# Patient Record
Sex: Male | Born: 1960 | Race: White | Hispanic: No | Marital: Married | Smoking: Former smoker
Health system: Southern US, Community
[De-identification: ages and names within clinical notes are randomized; demographics above are authoritative.]

## PROBLEM LIST (undated history)

## (undated) ENCOUNTER — Ambulatory Visit (HOSPITAL_COMMUNITY): Discharge status: Absent without leave | Payer: 59

## (undated) DIAGNOSIS — Z9889 Other specified postprocedural states: Secondary | ICD-10-CM

## (undated) DIAGNOSIS — M069 Rheumatoid arthritis, unspecified: Secondary | ICD-10-CM

## (undated) DIAGNOSIS — K219 Gastro-esophageal reflux disease without esophagitis: Secondary | ICD-10-CM

## (undated) DIAGNOSIS — L03119 Cellulitis of unspecified part of limb: Secondary | ICD-10-CM

## (undated) DIAGNOSIS — G4733 Obstructive sleep apnea (adult) (pediatric): Secondary | ICD-10-CM

## (undated) DIAGNOSIS — K5790 Diverticulosis of intestine, part unspecified, without perforation or abscess without bleeding: Secondary | ICD-10-CM

## (undated) DIAGNOSIS — R609 Edema, unspecified: Secondary | ICD-10-CM

## (undated) DIAGNOSIS — T7840XA Allergy, unspecified, initial encounter: Secondary | ICD-10-CM

## (undated) DIAGNOSIS — Z95828 Presence of other vascular implants and grafts: Secondary | ICD-10-CM

## (undated) DIAGNOSIS — M009 Pyogenic arthritis, unspecified: Secondary | ICD-10-CM

## (undated) DIAGNOSIS — J189 Pneumonia, unspecified organism: Secondary | ICD-10-CM

## (undated) DIAGNOSIS — H269 Unspecified cataract: Secondary | ICD-10-CM

## (undated) DIAGNOSIS — I10 Essential (primary) hypertension: Secondary | ICD-10-CM

## (undated) DIAGNOSIS — G4730 Sleep apnea, unspecified: Secondary | ICD-10-CM

## (undated) DIAGNOSIS — D649 Anemia, unspecified: Secondary | ICD-10-CM

## (undated) DIAGNOSIS — R112 Nausea with vomiting, unspecified: Secondary | ICD-10-CM

## (undated) HISTORY — PX: EXCISIONAL HEMORRHOIDECTOMY: SHX1541

## (undated) HISTORY — PX: REPLACEMENT TOTAL HIP W/  RESURFACING IMPLANTS: SUR1222

## (undated) HISTORY — PX: OTHER SURGICAL HISTORY: SHX169

## (undated) HISTORY — PX: REPLACEMENT TOTAL KNEE BILATERAL: SUR1225

## (undated) HISTORY — DX: Unspecified cataract: H26.9

## (undated) HISTORY — DX: Sleep apnea, unspecified: G47.30

## (undated) HISTORY — DX: Essential (primary) hypertension: I10

## (undated) HISTORY — DX: Diverticulosis of intestine, part unspecified, without perforation or abscess without bleeding: K57.90

## (undated) HISTORY — DX: Gastro-esophageal reflux disease without esophagitis: K21.9

## (undated) HISTORY — DX: Allergy, unspecified, initial encounter: T78.40XA

## (undated) HISTORY — PX: HERNIA REPAIR: SHX51

## (undated) HISTORY — PX: FOREIGN BODY REMOVAL: SHX962

---

## 2001-01-30 ENCOUNTER — Encounter: Payer: Self-pay | Admitting: Orthopedic Surgery

## 2001-02-03 ENCOUNTER — Encounter: Payer: Self-pay | Admitting: Orthopedic Surgery

## 2001-02-04 ENCOUNTER — Inpatient Hospital Stay (HOSPITAL_COMMUNITY): Admission: RE | Admit: 2001-02-04 | Discharge: 2001-02-07 | Payer: Self-pay | Admitting: Orthopedic Surgery

## 2004-11-29 ENCOUNTER — Encounter: Admission: RE | Admit: 2004-11-29 | Discharge: 2004-11-29 | Payer: Self-pay | Admitting: Internal Medicine

## 2005-01-05 ENCOUNTER — Encounter: Admission: RE | Admit: 2005-01-05 | Discharge: 2005-01-05 | Payer: Self-pay | Admitting: Internal Medicine

## 2005-01-13 ENCOUNTER — Inpatient Hospital Stay (HOSPITAL_COMMUNITY): Admission: EM | Admit: 2005-01-13 | Discharge: 2005-01-15 | Payer: Self-pay | Admitting: Emergency Medicine

## 2006-05-29 ENCOUNTER — Ambulatory Visit: Payer: Self-pay | Admitting: Rheumatology

## 2006-11-08 ENCOUNTER — Inpatient Hospital Stay (HOSPITAL_COMMUNITY): Admission: RE | Admit: 2006-11-08 | Discharge: 2006-11-11 | Payer: Self-pay | Admitting: Orthopedic Surgery

## 2009-09-05 ENCOUNTER — Observation Stay (HOSPITAL_COMMUNITY): Admission: EM | Admit: 2009-09-05 | Discharge: 2009-09-06 | Payer: Self-pay | Admitting: Emergency Medicine

## 2010-07-03 ENCOUNTER — Encounter: Admission: RE | Admit: 2010-07-03 | Discharge: 2010-07-03 | Payer: Self-pay | Admitting: Internal Medicine

## 2010-08-23 ENCOUNTER — Inpatient Hospital Stay (HOSPITAL_COMMUNITY)
Admission: RE | Admit: 2010-08-23 | Discharge: 2010-08-26 | Payer: Self-pay | Source: Home / Self Care | Admitting: Orthopedic Surgery

## 2010-10-15 HISTORY — PX: REVISION TOTAL KNEE ARTHROPLASTY: SUR1280

## 2010-10-15 HISTORY — PX: COLONOSCOPY: SHX174

## 2010-11-05 ENCOUNTER — Encounter (HOSPITAL_BASED_OUTPATIENT_CLINIC_OR_DEPARTMENT_OTHER): Payer: Self-pay | Admitting: Internal Medicine

## 2010-12-21 ENCOUNTER — Other Ambulatory Visit: Payer: Self-pay | Admitting: Orthopedic Surgery

## 2010-12-21 ENCOUNTER — Other Ambulatory Visit (HOSPITAL_COMMUNITY): Payer: Self-pay | Admitting: Orthopedic Surgery

## 2010-12-21 ENCOUNTER — Ambulatory Visit (HOSPITAL_COMMUNITY)
Admission: RE | Admit: 2010-12-21 | Discharge: 2010-12-21 | Disposition: A | Discharge status: Home / Self Care | Payer: 59 | Source: Ambulatory Visit | Attending: Orthopedic Surgery | Admitting: Orthopedic Surgery

## 2010-12-21 ENCOUNTER — Encounter (HOSPITAL_COMMUNITY): Payer: 59

## 2010-12-21 DIAGNOSIS — Z01811 Encounter for preprocedural respiratory examination: Secondary | ICD-10-CM | POA: Insufficient documentation

## 2010-12-21 DIAGNOSIS — Z01812 Encounter for preprocedural laboratory examination: Secondary | ICD-10-CM | POA: Insufficient documentation

## 2010-12-21 DIAGNOSIS — I10 Essential (primary) hypertension: Secondary | ICD-10-CM

## 2010-12-21 DIAGNOSIS — M169 Osteoarthritis of hip, unspecified: Secondary | ICD-10-CM | POA: Insufficient documentation

## 2010-12-21 DIAGNOSIS — M1610 Unilateral primary osteoarthritis, unspecified hip: Secondary | ICD-10-CM | POA: Insufficient documentation

## 2010-12-21 LAB — COMPREHENSIVE METABOLIC PANEL
ALT: 14 U/L (ref 0–53)
AST: 20 U/L (ref 0–37)
Albumin: 3.6 g/dL (ref 3.5–5.2)
Alkaline Phosphatase: 73 U/L (ref 39–117)
Calcium: 9.2 mg/dL (ref 8.4–10.5)
Chloride: 103 mEq/L (ref 96–112)
GFR calc non Af Amer: >60 mL/min (ref >60)
Glucose, Bld: 79 mg/dL (ref 70–99)
Sodium: 139 mEq/L (ref 135–145)

## 2010-12-21 LAB — CBC
HCT: 41 % (ref 39.0–52.0)
Hemoglobin: 13.2 g/dL (ref 13.0–17.0)
MCHC: 32.2 g/dL (ref 30.0–36.0)
RDW: 13.6 % (ref 11.5–15.5)
WBC: 9.5 10*3/uL (ref 4.0–10.5)

## 2010-12-21 LAB — URINALYSIS, ROUTINE W REFLEX MICROSCOPIC
Ketones, ur: NEGATIVE mg/dL
Nitrite: NEGATIVE
Protein, ur: NEGATIVE mg/dL
Specific Gravity, Urine: 1.005 (ref 1.005–1.030)
pH: 7 (ref 5.0–8.0)

## 2010-12-21 LAB — APTT: aPTT: 33 seconds (ref 24–37)

## 2010-12-21 LAB — SURGICAL PCR SCREEN: Staphylococcus aureus: NEGATIVE

## 2010-12-26 LAB — CBC
HCT: 30.4 % — ABNORMAL LOW (ref 39.0–52.0)
Hemoglobin: 10.3 g/dL — ABNORMAL LOW (ref 13.0–17.0)
Hemoglobin: 10.4 g/dL — ABNORMAL LOW (ref 13.0–17.0)
MCH: 29.5 pg (ref 26.0–34.0)
MCV: 86.6 fL (ref 78.0–100.0)
Platelets: 274 10*3/uL (ref 150–400)
RBC: 3.51 MIL/uL — ABNORMAL LOW (ref 4.22–5.81)
RBC: 3.54 MIL/uL — ABNORMAL LOW (ref 4.22–5.81)
RBC: 3.86 MIL/uL — ABNORMAL LOW (ref 4.22–5.81)
WBC: 12.8 10*3/uL — ABNORMAL HIGH (ref 4.0–10.5)

## 2010-12-26 LAB — PROTIME-INR
INR: 1.18 (ref 0.00–1.49)
INR: 1.26 (ref 0.00–1.49)
INR: 1.28 (ref 0.00–1.49)
Prothrombin Time: 16 seconds — ABNORMAL HIGH (ref 11.6–15.2)
Prothrombin Time: 16.2 seconds — ABNORMAL HIGH (ref 11.6–15.2)

## 2010-12-26 LAB — BASIC METABOLIC PANEL
BUN: 10 mg/dL (ref 6–23)
BUN: 12 mg/dL (ref 6–23)
CO2: 31 mEq/L (ref 19–32)
Calcium: 8.5 mg/dL (ref 8.4–10.5)
Chloride: 103 mEq/L (ref 96–112)
Creatinine, Ser: 0.83 mg/dL (ref 0.4–1.5)
GFR calc Af Amer: >60 mL/min (ref >60)
GFR calc non Af Amer: >60 mL/min (ref >60)
GFR calc non Af Amer: >60 mL/min (ref >60)
Glucose, Bld: 144 mg/dL — ABNORMAL HIGH (ref 70–99)
Potassium: 4.3 mEq/L (ref 3.5–5.1)
Potassium: 4.4 mEq/L (ref 3.5–5.1)
Sodium: 140 mEq/L (ref 135–145)

## 2010-12-26 LAB — TYPE AND SCREEN
ABO/RH(D): O POS
Antibody Screen: NEGATIVE

## 2010-12-27 ENCOUNTER — Inpatient Hospital Stay (HOSPITAL_COMMUNITY)
Admission: RE | Admit: 2010-12-27 | Discharge: 2010-12-27 | DRG: 561 | Disposition: A | Discharge status: Home / Self Care | Payer: 59 | Source: Ambulatory Visit | Attending: Orthopedic Surgery | Admitting: Orthopedic Surgery

## 2010-12-27 DIAGNOSIS — Z5309 Procedure and treatment not carried out because of other contraindication: Secondary | ICD-10-CM

## 2010-12-27 DIAGNOSIS — Z0183 Encounter for blood typing: Secondary | ICD-10-CM

## 2010-12-27 DIAGNOSIS — Z96659 Presence of unspecified artificial knee joint: Secondary | ICD-10-CM

## 2010-12-27 DIAGNOSIS — Y833 Surgical operation with formation of external stoma as the cause of abnormal reaction of the patient, or of later complication, without mention of misadventure at the time of the procedure: Secondary | ICD-10-CM | POA: Diagnosis present

## 2010-12-27 DIAGNOSIS — T84099A Other mechanical complication of unspecified internal joint prosthesis, initial encounter: Principal | ICD-10-CM | POA: Diagnosis present

## 2010-12-27 LAB — URINALYSIS, ROUTINE W REFLEX MICROSCOPIC
Bilirubin Urine: NEGATIVE
Glucose, UA: NEGATIVE mg/dL
Hgb urine dipstick: NEGATIVE
Nitrite: NEGATIVE
Urobilinogen, UA: 0.2 mg/dL (ref 0.0–1.0)
pH: 6.5 (ref 5.0–8.0)

## 2010-12-27 LAB — COMPREHENSIVE METABOLIC PANEL
ALT: 14 U/L (ref 0–53)
AST: 19 U/L (ref 0–37)
Alkaline Phosphatase: 70 U/L (ref 39–117)
Calcium: 9.3 mg/dL (ref 8.4–10.5)
Chloride: 103 mEq/L (ref 96–112)
GFR calc Af Amer: >60 mL/min (ref >60)
GFR calc non Af Amer: >60 mL/min (ref >60)

## 2010-12-27 LAB — CBC
MCV: 85.3 fL (ref 78.0–100.0)
Platelets: 347 10*3/uL (ref 150–400)
RDW: 13.4 % (ref 11.5–15.5)

## 2010-12-27 LAB — APTT: aPTT: 34 seconds (ref 24–37)

## 2010-12-27 LAB — SURGICAL PCR SCREEN
MRSA, PCR: NEGATIVE
Staphylococcus aureus: POSITIVE — AB

## 2010-12-27 LAB — PROTIME-INR: Prothrombin Time: 14.7 seconds (ref 11.6–15.2)

## 2011-01-03 ENCOUNTER — Inpatient Hospital Stay (HOSPITAL_COMMUNITY): Admission: RE | Admit: 2011-01-03 | Payer: 59 | Source: Ambulatory Visit | Admitting: Orthopedic Surgery

## 2011-01-03 ENCOUNTER — Inpatient Hospital Stay (HOSPITAL_COMMUNITY)
Admission: RE | Admit: 2011-01-03 | Discharge: 2011-01-07 | DRG: 468 | Disposition: A | Discharge status: Home Health | Payer: 59 | Source: Ambulatory Visit | Attending: Orthopedic Surgery | Admitting: Orthopedic Surgery

## 2011-01-03 DIAGNOSIS — T84099A Other mechanical complication of unspecified internal joint prosthesis, initial encounter: Principal | ICD-10-CM | POA: Diagnosis present

## 2011-01-03 DIAGNOSIS — Y831 Surgical operation with implant of artificial internal device as the cause of abnormal reaction of the patient, or of later complication, without mention of misadventure at the time of the procedure: Secondary | ICD-10-CM | POA: Diagnosis present

## 2011-01-03 DIAGNOSIS — Z96659 Presence of unspecified artificial knee joint: Secondary | ICD-10-CM

## 2011-01-03 DIAGNOSIS — K219 Gastro-esophageal reflux disease without esophagitis: Secondary | ICD-10-CM | POA: Diagnosis present

## 2011-01-03 DIAGNOSIS — G4733 Obstructive sleep apnea (adult) (pediatric): Secondary | ICD-10-CM | POA: Diagnosis present

## 2011-01-03 DIAGNOSIS — M069 Rheumatoid arthritis, unspecified: Secondary | ICD-10-CM | POA: Diagnosis present

## 2011-01-03 DIAGNOSIS — I10 Essential (primary) hypertension: Secondary | ICD-10-CM | POA: Diagnosis present

## 2011-01-03 DIAGNOSIS — J45909 Unspecified asthma, uncomplicated: Secondary | ICD-10-CM | POA: Diagnosis present

## 2011-01-04 LAB — BASIC METABOLIC PANEL
BUN: 12 mg/dL (ref 6–23)
Chloride: 105 mEq/L (ref 96–112)
Glucose, Bld: 119 mg/dL — ABNORMAL HIGH (ref 70–99)
Potassium: 4.3 mEq/L (ref 3.5–5.1)

## 2011-01-04 LAB — CBC
HCT: 33.9 % — ABNORMAL LOW (ref 39.0–52.0)
Hemoglobin: 10.5 g/dL — ABNORMAL LOW (ref 13.0–17.0)
MCV: 86.7 fL (ref 78.0–100.0)
RBC: 3.91 MIL/uL — ABNORMAL LOW (ref 4.22–5.81)
WBC: 10.3 10*3/uL (ref 4.0–10.5)

## 2011-01-05 LAB — BASIC METABOLIC PANEL
BUN: 7 mg/dL (ref 6–23)
CO2: 29 mEq/L (ref 19–32)
Calcium: 8.3 mg/dL — ABNORMAL LOW (ref 8.4–10.5)
GFR calc non Af Amer: >60 mL/min (ref >60)
Glucose, Bld: 151 mg/dL — ABNORMAL HIGH (ref 70–99)
Potassium: 4.1 mEq/L (ref 3.5–5.1)

## 2011-01-05 LAB — CBC
HCT: 32.1 % — ABNORMAL LOW (ref 39.0–52.0)
Hemoglobin: 10 g/dL — ABNORMAL LOW (ref 13.0–17.0)
MCHC: 31.2 g/dL (ref 30.0–36.0)
MCV: 86.5 fL (ref 78.0–100.0)
RDW: 14 % (ref 11.5–15.5)

## 2011-01-06 LAB — CBC
HCT: 31.6 % — ABNORMAL LOW (ref 39.0–52.0)
MCH: 26.4 pg (ref 26.0–34.0)
MCHC: 30.7 g/dL (ref 30.0–36.0)
RDW: 14 % (ref 11.5–15.5)

## 2011-01-08 NOTE — Op Note (Signed)
[name], [name]                 ACCOUNT NO.:  [id number]  MEDICAL RECORD NO.:  [id number]           PATIENT TYPE:  I  LOCATION:  1416                         FACILITY:  [facility]  PHYSICIAN:  Ollen Gross, M.D.    DATE OF BIRTH:  [birth year]  DATE OF PROCEDURE: DATE OF DISCHARGE:                              OPERATIVE REPORT   PREOPERATIVE DIAGNOSIS:  Failed right total knee arthroplasty.  POSTOPERATIVE DIAGNOSIS:  Failed right total knee arthroplasty.  PROCEDURE:  Right total knee arthroplasty revision.  SURGEON:  Ollen Gross, M.D.  ASSISTANT:  Rozell Searing, PA-C  ANESTHESIA:  General.  ESTIMATED BLOOD LOSS:  Minimal.  DRAINS:  Hemovac x1.  TOURNIQUET TIME:  Up 43 minutes at 300 mmHg, down 8 minutes, up additional 27 minutes at 300 mmHg.  COMPLICATIONS:  None.  CONDITION:  Stable to recovery room.  BRIEF CLINICAL NOTE:  [name] is a 50 year old male who had a right total knee arthroplasty done many years ago in [place].  He has had progressive instability and effusions.  Radiographs showed that there is polyethylene wear without any definitive osteolysis.  He has a first- generation PCA knee.  The model has been discontinued and the polyethylenes are not readily available.  He presents now for a total knee arthroplasty revision.  PROCEDURE IN DETAIL:  After successful administration of general anesthetic, a tourniquet is placed high on his right thigh and his right lower extremity was prepped and draped in the usual sterile fashion. Extremities wrapped in Esmarch, knee flexed and tourniquet inflated to 300 mmHg.  Midline incision was made with a 10 blade through the subcutaneous tissue to the extensor mechanism.  A fresh blade is used to make a medial parapatellar arthrotomy.  Soft tissue on the proximal medial tibia subperiosteally elevated to joint line with the knife and into the semimembranosus bursa with a Cobb elevator.  There was a tremendous amount of  wear, debris and fibrinous material in the joint. I did not encounter any significant fluid.  Absolutely no signs of infection in the joint.  There were flakes of polyethylene throughout the joint.  These were all removed and a thorough synovectomy was performed.  Patella was then everted laterally and the knee flexed 90 degrees.  I removed the polyethylene easily from the tibial tray and there is delamination of the polyethylene with a crack posterior medially.  This was removed.  The tibial component is grossly loose.  I then using osteotomes disrupting interface between the femoral component and bone and that is removed with minimal bone loss.  The tibia subluxed, forward retractors were placed and tibial component easily removed.  Then removed the cement.  The canals were entered in above the tibial and femoral side and both were thoroughly irrigated.  We reamed on the femoral side up to 22 mm, tibial side to 13 mm.  Tibia is again subluxed.  Forward retractors were placed.  The extramedullary tibial alignment guide is placed referencing proximally at the medial third and tibial tubercle and distally along the second metatarsal axis and tibial crest.  Block is pinned to take additional mm  off the cut bone surface.  This led to a nice stable platform and healthy-appearing bone.  Then I prepared with a sleeve, get up to 37 mm with excellent torsional fit.  Proximal tibia is then prepared to modular drill and keel punch also.  The trial tibia was placed which was then MBT size 4 revision tray with a 37 sleeve and 13 x 30 stem extension.  It had an excellent fit on the cut bone surface.  We then worked on the femur.  A 22 reamer was placed to serve as our intramedullary cutting guide.  The size 4 is most appropriate femur and then we placed the distal femoral cutting block, 5 degrees of valgus.  I skimmed some bone medial and lateral, I needed a 4-mm augment on each to get to the  appropriate joint line.  We then placed a size 4 cutting block in a +2 position effectively raising the stem and lowering the flange of the component down to the anterior cortex of the femur.  The anterior and posterior cuts are made after we set the rotation based on creating a rectangular flexion gap with spacer blocks.  Cuts are made. We need 4 mm augments posteriorly, both medial and lateral.  The intercondylar block is placed and chamfer intercondylar cuts made.  I made the cuts for TC3 component.  The trials were then placed.  Tibial side is the size 4 MBT revision tray with a 37 sleeve and 13 x 30 stem extension and on the femoral side, it is a size 4 TC3 component with a 18 x 75 stem in a +2 position, 4 mm augments medial and lateral distally and 4 mm augments posteriorly medial and lateral.  Both trials had excellent fit on the cut bone surfaces.  I trialed a 20 mm insert and has great stability with full extension, had no varus or valgus play and no anterior-posterior plane flexion.  The patella components appeared intact.  I did patelloplasty, removing all soft tissue of the component and it is intact with minimal cold flow but no wear.  Patella tracks normally.  With the trial in place, we then released the tourniquet for initial tourniquet time of 43 minutes.  Tourniquet is held down for 8 minutes while the components were assembled on the back table.  We then thoroughly irrigated the joint.  The tourniquet was down for 8 minutes and then the leg was rewrapped in [employer].  The trials were removed.  We then trialed for the cement restrictor in the tibia, into size 5.  Space the appropriate depth in the tibial canal.  Wound is then copiously irrigated with saline solution and cut bone surfaces copiously irrigated with pulsatile lavage.  The cement was then mixed.  Once ready for implantation, is injected in the tibial canal and tibial component is impacted and extruded cement  removed.  Femoral component cemented distally with a Press-Fit stem.  All extruded cement was removed.  A 20-mm trial was placed, knee held in full extension and remainder of the extruded cement removed.  When cement has fully hardened, then the permanent 20 mm TC3 rotating platform insert is placed in the tibial tray.  The knee was reduced with outstanding stability.  There is full extension with excellent varus-valgus, anterior-posterior stability throughout, full range of motion.  Patella tracks normally.  Wound was again irrigated with saline solution and then the arthrotomy closed over  Hemovac drain with interrupted #1 PDS.  Flexion against gravity  was 125 degrees. Patella tracks normally.  The tourniquet then released for a second tourniquet time of 27 minutes.  Subcu was closed with interrupted 2-0 Vicryl and skin with staples.  The catheter for the Marcaine pain pump is placed and pump is initiated.  Incision was cleaned and dried and Steri-Strips and bulky sterile dressing were applied.  He is then placed into a knee immobilizer, awakened and transported to recovery in stable condition.     Ollen Gross, M.D.     FA/MEDQ  D:  01/03/2011  T:  01/03/2011  Job:  [id number]  Electronically Signed by Ollen Gross M.D. on 01/08/2011 07:11:41 AM

## 2011-01-17 LAB — POCT I-STAT, CHEM 8
BUN: 12 mg/dL (ref 6–23)
Calcium, Ion: 1.18 mmol/L (ref 1.12–1.32)
Chloride: 104 mEq/L (ref 96–112)
Creatinine, Ser: 0.8 mg/dL (ref 0.4–1.5)
Glucose, Bld: 101 mg/dL — ABNORMAL HIGH (ref 70–99)
Potassium: 4.5 mEq/L (ref 3.5–5.1)

## 2011-01-17 LAB — CBC
MCHC: 34.1 g/dL (ref 30.0–36.0)
Platelets: 351 10*3/uL (ref 150–400)
RBC: 4.81 MIL/uL (ref 4.22–5.81)
RDW: 13.6 % (ref 11.5–15.5)

## 2011-01-17 LAB — PROTIME-INR
INR: 1.07 (ref 0.00–1.49)
Prothrombin Time: 13.8 seconds (ref 11.6–15.2)

## 2011-01-17 LAB — APTT: aPTT: 28 seconds (ref 24–37)

## 2011-01-17 LAB — DIFFERENTIAL
Basophils Absolute: 0.3 10*3/uL — ABNORMAL HIGH (ref 0.0–0.1)
Basophils Relative: 3 % — ABNORMAL HIGH (ref 0–1)
Eosinophils Absolute: 0.1 10*3/uL (ref 0.0–0.7)
Monocytes Relative: 4 % (ref 3–12)
Neutro Abs: 7 10*3/uL (ref 1.7–7.7)
Neutrophils Relative %: 80 % — ABNORMAL HIGH (ref 43–77)

## 2011-01-31 NOTE — Discharge Summary (Signed)
  [name], MCCASTER                 ACCOUNT NO.:  [id number]  MEDICAL RECORD NO.:  [id number]           PATIENT TYPE:  I  LOCATION:  0007                         FACILITY:  [facility]  PHYSICIAN:  Alexzandrew L. Perkins, P.A.C.DATE OF BIRTH:  [birth year]  DATE OF ADMISSION:  12/27/2010 DATE OF DISCHARGE:  12/27/2010                              DISCHARGE SUMMARY   ADMITTING DIAGNOSIS:  Failed right total-knee arthroplasty.  DISCHARGE DIAGNOSIS:  Failed right total-knee arthroplasty.  PROCEDURES:  None.  Surgery was cancelled.  BRIEF HISTORY:  The patient is a 50 year old male who was supposed to be undergoing surgery today for right total-knee arthroplasty, poly- revision versus a total-knee revision.  Unfortunately he had dipped snuff prior to the surgery on the day of surgery and felt it was not safe to proceed so the surgery was cancelled and the patient was discharged home.  HOSPITAL COURSE:  None.  Surgery was cancelled.  LABORATORY DATA:  The only laboratory scanned into the chart was his blood group type O positive.  DISCHARGE MEDICATIONS/PLAN:  The patient was discharged home.  The surgery will be rescheduled.  He will be contacted by the surgery scheduler to arrange for a future date for his cancelled procedure.     Alexzandrew L. Perkins, P.A.C.     ALP/MEDQ  D:  01/22/2011  T:  01/22/2011  Job:  [id number]  Electronically Signed by Patrica Duel P.A.C. on 01/25/2011 11:11:31 AM Electronically Signed by Ollen Gross M.D. on 01/31/2011 09:52:42 AM

## 2011-02-12 NOTE — Discharge Summary (Signed)
[name], [name]                 ACCOUNT NO.:  [id number]  MEDICAL RECORD NO.:  [id number]           PATIENT TYPE:  I  LOCATION:  1602                         FACILITY:  [facility]  PHYSICIAN:  [name], M.D.    DATE OF BIRTH:  1961-06-27  DATE OF ADMISSION:  01/03/2011 DATE OF DISCHARGE:  01/07/2011                              DISCHARGE SUMMARY   ADMITTING DIAGNOSES: 1. Right knee pain, failed arthroplasty. 2. Sleep apnea. 3. Hypertension. 4. History of asthma. 5. Reflux disease. 6. Rheumatoid arthritis. 7. Past history of cellulitis.  DISCHARGE DIAGNOSIS: 1. Failed right total knee arthroplasty, status post right total knee     replacement arthroplasty. 2. Mild postop acute blood loss anemia. 3. Sleep apnea. 4. Hypertension. 5. History of asthma. 6. Reflux disease. 7. Rheumatoid arthritis. 8. Past history of cellulitis.  PROCEDURES:  On January 03, 2011, right total knee arthroplasty revision.  SURGEON:  [name], M.D.  ASSISTANT:  Vincent Searing, PA-C.  ANESTHESIA:  General.  CONSULTS:  None.  BRIEF HISTORY:  Patient is a 50 year old male with a right total knee done many years ago in [place].  He had progressive instability and effusions.  Radiographs show polyethylene wear with definitive osteolysis.  He has a first generation PCA knee.  Model has been discontinue.  The polyethylenes are not readily available and now presents for revision.  LABORATORY DATA:  I do not have the admission CBC.  It was not scanned into this chart.  His follow-up CBC showed a hemoglobin of 10.5 and 10. Last H and H was down to 9.7 with a hematocrit of 31.6.  White count went up from 10-13.5, back down to 12.7.  I do not have the admission Chem panel, but the followup BMETs for 48 hours are all within normal limits.  Blood group type O+.  DIAGNOSTIC STUDIES:  EKG dated January 03, 2011 normal sinus rhythm, normal EKG, no specific change since last tracing, confirmed by  Dr. Olga [name].  HOSPITAL COURSE:  Patient was admitted to the [facility] and taken to OR, underwent above-stated procedure without complication. Patient tolerated the procedure well.  Patient was transferred to telemetry floor following surgery where he is monitored postoperatively. He did well through the night and then was transferred to the 6 [place]. On postop day 1, he did have some volume overload, which we had to diurese some of the extra fluids.  He was started on all of his home meds.  His rheumatoid meds were on hold temporarily just for the blood thinners.  He actually did pretty well through the night and that is why I went up to the floor on day 1.  By day 2, his pain was under better control.  He was starting to get up with therapy, was diuresing his fluids little bit better.  Dressing change and incision looked good. His blood pressure was actually little soft at that point and felt if he needed fluids that we would do that, but otherwise his hemoglobin was a level of 10.  He was slowly progressing with physical therapy and not quite ready  on day 3, still needed a little bit more therapy.  He is progressing slowly, so continue with therapy on day 3 and by day 4, he was much improved.  He was starting to meet his goals of physical therapy.  He was tolerating meds and then he was discharged home.  DISCHARGE/PLAN: 1. Patient discharged home on January 07, 2011. 2. Discharge diagnoses, please see above. 3. Discharge medications: 4. Robaxin. 5. Percocet. 6. Xarelto. 7. Continue Advair Diskus. 8. Cardizem. 9. Cetirizine. 10.Cozaar. 11.Hydrochlorothiazide. 12.Protonix. 13.Reglan. 14.Toprol XL.  DIET:  Heart-healthy diet.  ACTIVITY:  Weightbearing as tolerated.  FOLLOWUP:  Follow-up in 2 weeks.  DISPOSITION:  Home.  CONDITION ON DISCHARGE:  Improved.     [name], P.A.C.   ______________________________ [name],  M.D.    ALP/MEDQ  D:  02/01/2011  T:  02/01/2011  Job:  [id number]  cc:   [name], M.D. Fax: [phone]  Electronically Signed by [name] P.A.C. on 02/05/2011 07:58:02 AM Electronically Signed by [name] M.D. on 02/12/2011 07:09:53 AM

## 2011-02-16 ENCOUNTER — Ambulatory Visit (AMBULATORY_SURGERY_CENTER): Payer: 59 | Admitting: *Deleted

## 2011-02-16 VITALS — Ht 68.0 in | Wt 311.0 lb

## 2011-02-16 DIAGNOSIS — Z800 Family history of malignant neoplasm of digestive organs: Secondary | ICD-10-CM

## 2011-02-16 DIAGNOSIS — Z1211 Encounter for screening for malignant neoplasm of colon: Secondary | ICD-10-CM

## 2011-02-16 MED ORDER — PEG-KCL-NACL-NASULF-NA ASC-C 100 G PO SOLR: ORAL | Status: DC

## 2011-02-16 NOTE — Progress Notes (Signed)
Addended by: Sarina Ill ANN on: 02/16/2011 02:59 PM   Modules accepted: Orders

## 2011-02-19 ENCOUNTER — Encounter: Payer: Self-pay | Admitting: Gastroenterology

## 2011-03-01 ENCOUNTER — Ambulatory Visit (AMBULATORY_SURGERY_CENTER): Payer: 59 | Admitting: Gastroenterology

## 2011-03-01 ENCOUNTER — Encounter: Payer: Self-pay | Admitting: Gastroenterology

## 2011-03-01 DIAGNOSIS — Z800 Family history of malignant neoplasm of digestive organs: Secondary | ICD-10-CM

## 2011-03-01 DIAGNOSIS — Z1211 Encounter for screening for malignant neoplasm of colon: Secondary | ICD-10-CM

## 2011-03-01 DIAGNOSIS — K5730 Diverticulosis of large intestine without perforation or abscess without bleeding: Secondary | ICD-10-CM

## 2011-03-01 MED ORDER — SODIUM CHLORIDE 0.9 % IV SOLN: 500.0000 mL | INTRAVENOUS | Status: DC

## 2011-03-01 NOTE — Patient Instructions (Signed)
Moderate diverticulosis in a portion of the colon.  Please eat a high fiber diet with liberal fluid intake after today.  Continue a healthy lifestyle and return for another colonoscopy in 10 years.

## 2011-03-02 ENCOUNTER — Telehealth: Payer: Self-pay

## 2011-03-02 NOTE — Op Note (Signed)
[place]. [facility]  Patient:    [name], [name]                          MRN: [id number] Proc. Date: 02/03/01 Adm. Date:  [id number] Attending:  Ollen Gross V                           Operative Report  PREOPERATIVE DIAGNOSIS:  Osteoarthritis/AVM left hip.  POSTOPERATIVE DIAGNOSIS:  Osteoarthritis/AVM left hip.  PROCEDURE:  Left total knee arthroplasty.  SURGEON:  Trudee Grip, M.D.  ASSISTANT:  Alexzandrew L. Perkins, P.A.-C.  ANESTHESIA:  General.  ESTIMATED BLOOD LOSS:  600 cc.  DRAINS:  Hemovac x 1.  COMPLICATIONS:  None.  CONDITION:  Stable to recovery room.  INDICATIONS:  Abdur is a 50 year old male with severe degenerative change in his left hip with pain refractory to nonoperative management.  He presents now for left total knee arthroplasty.  DESCRIPTION OF PROCEDURE:  After successful administration of general anesthesia, the patient was placed in the right lateral decubitus position with the left side up and held with the hip positioner.  Left lower extremity was isolated from his perineoplasty drapes and prepped and draped in the usual sterile fashion.  A standard posterolateral incision is made, skin cut with 10 blade, subcutaneous tissue, to the level of the fascia latta which was incised in line with the skin incision.  Short external rotators were isolated off the femur and capsulectomy performed.  He had a huge rim of osteophyte approximately 1/2 inch circumferentially around the acetabulum.  The hip was dislocated, center of femoral head marked and trial prosthesis placed such that the center of the trial head corresponds to the center of the native femoral head and then the osteotomy made on the femoral neck with oscillating saw.  Femur was retracted anteriorly and acetabular exposure obtained.  The circumferential rim of osteophyte is subsequently removed.  The acetabulum is reamed starting at size 47, coursing up  increments to a 55 and then a 56 mm pinnacle sector cup is impacted into the acetabulum matching his native anatomy.  It is transfixed with dome screw and then a trial 36 mm neutral liner is placed.  Femoral preparation was initiated with a canal finder and axial reaming up to 15.5 mm.  Proximal reaming was up to a 36F and the sleeve is machined to a 36F large trial sleeve is placed with a 20 x 15 stem and a 36+8 neck.  Trial 36+0 head is placed.  Hip reduced with excellent stability throughout.  I achieved full extension, full external rotation, 70 degrees of flexion, 40 degrees adduction, 90 degrees internal rotation, and 90 degrees flexion and 70 degree internal rotation.  The hip was then copiously irrigated and the trials removed.  The apex four-way eliminator was placed into the acetabular shell.  Given his young age, we discussed options preoperatively and decided on the metal versus metal couple.  The metal liner is then placed via suction fit into the acetabulum and gently impacted into the shell.  This had a great fit.  The femur is then prepared with the proximal sleeve, 36F large impacted into the femur and then a 20 x 15 stem, #36+8 neck.  It matched his native anteversion.  Once this stem was fully impacted and a 36+0 head was placed and hip reduced, once again there was excellent stability.  The wound was  copiously irrigated with antibiotic solution and the rotators were attached to the femur through drillholes.  Fascia latta is closed over hemovac drain with interrupted #1 Vicryl in the subcu layer which was extremely thick, was closed in three layers with 0, then 0, then 2-0 Vicryl and the subcuticular closed with a running 3-0 Monocryl.  Incision was clean and dry and Steri-Strips and a bulky sterile dressing applied.  Drains hooked to suction.  The patient was placed into a knee immobilizer, awakened and transported to the recovery room in stable condition. DD:   02/03/01 TD:  02/04/01 Job: [id number]

## 2011-03-02 NOTE — Discharge Summary (Signed)
[place]. [facility]  Patient:    [name], [name]                          MRN: [id number] Adm. Date:  [id number] Disc. Date: [id number] Attending:  Loanne Drilling Dictator:   Dorie Rank, P.A.                           Discharge Summary  ADMISSION DIAGNOSES: 1. Rheumatoid arthritis. 2. Hypertension. 3. Sleep apnea.  DISCHARGE DIAGNOSES: 1. Rheumatoid arthritis, status post left total hip arthroplasty. 2. Hypertension. 3. Sleep apnea.  OPERATIONS:  On February 03, 2001, the patient underwent a left total hip arthroplasty.  SURGEON:  Ollen Gross, M.D.  ASSISTANT:  Avel Peace, P.A.-C.  ANESTHESIA:  General.  COMPLICATIONS:  None.  CONSULTS:  None.  BRIEF HISTORY:  The patient is a 50 year old white male with severe degenerative changes to his left hip that has been refractory to nonoperative management.  He was at a point where the pain was interfering with his ADLs. It was felt that he would benefit from undergoing a left total hip arthroplasty and he was admitted to the hospital to undergo the above stated procedure.  HOSPITAL COURSE:  The patient was admitted on February 03, 2001 to undergo a left total hip arthroplasty.  He tolerated the procedure well without any complications.  While in the operating room, a Hemovac drain was placed in the wound.  This was discontinued on postoperative day #1 without difficulty.  The wound was dressed in a sterile fashion while in the operating room.  This was left intact and found to be free of any drainage on postoperative day #1.  On postoperative day #2, the dressing was changed and was changed daily after that.  The wound was found to be free of any erythema or discharge throughout his hospital stay.  Postoperatively, the patient was placed on Coumadin for DVT prophylaxis.  The pharmacy dosed and monitored his Coumadin therapy as well as his INR.  Postoperatively, the patient utilized IV vancomycin for  24 hours for postoperative infection prevention.  The patient utilized a PCA of morphine for pain control.  By postoperative day #2, he was weaned off the PCA and utilized Percocet for pain, thus after.  The Percocet controlled his pain throughout his hospital stay.  While in the operating room, a Foley catheter was placed.  This was discontinued on postoperative day #2 and the patient voided well throughout the rest of his hospital stay.  A hemoglobin and hematocrit were monitored for three days postoperatively.  This was found to be stable throughout his hospital stay.  Hemoglobin on February 05, 2001, was 11.7, and on February 06, 2001, was 12.6.  Physical therapy worked with the patient for ambulation and mobility per total hip protocol.  He was placed on a touchdown weightbearing status to the right lower extremity.  He was able to maintain this weightbearing status and was ambulating well prior to discharge. On February 06, 2001, he was ambulating 100 feet with close monitoring. Occupational therapy worked with the patient for activities of daily living. The nurse case manager is working with the patient while in the hospital to facilitate his home health needs as well as setting him up with home health physical therapy.  The patient did have some constipation while in the hospital.  He was placed on ______ and on February 06, 2001, he did have a bowel movement with an enema.  A BMET was obtained on postoperative day #1 to monitor his electrolytes.  They were found to be normal on February 04, 2001. Postoperatively, the patient utilized an IV of D-5 half-normal saline, this was ______ on postoperative day #2 and his IV was discontinued on postoperative day #3.  He was found to be taking p.o. fluids well.  The patient utilized his home CPAP machine for his sleep apnea while in the hospital.  He was also administered his Enbrel from home.  His other home medication of hydrochlorothiazide was resumed.   His blood pressure and vital signs were monitored throughout his hospital stay and his blood pressure was found to be under good control while in the hospital.  His allegra D was resumed while in the hospital.  On February 07, 2001, the patient was felt to be medically stable for discharge.  LABORATORY VALUES:  CBC on February 06, 2001 revealed a white blood count of 11.9, RBC count 3.99, hemoglobin 12.6, hematocrit 35.7.  Hemoglobin on February 04, 2001 was 12.7, hematocrit was 36.3.  On February 06, 2001, hemoglobin was 12.6, hematocrit 35.7, PT and INR on February 04, 2001 was 15.1 and 1.3, respectively.  On February 06, 2001, PT was 17.8, INR was 1.8.  BMET on February 04, 2001 was within normal limits, ______ glucose of 126.  TBIL January 30, 2001 was 1.4, UA on January 30, 2001 was negative.  RADIOLOGY:  One view left hip x-ray on February 03, 2001 revealed a left total hip replacement without definite complicating features.  A 2-D chest x-ray on January 30, 2001 revealed no active disease.  EKG on January 30, 2001 was unconfirmed and showed normal sinus rhythm with occasional premature supraventricular complexes.  CONDITION ON DISCHARGE:  Improved.  DISCHARGE PLANS:  The patient was discharged home with home health physical therapy and Coumadin maintenance through [facility].  He was to continue his touchdown weightbearing status to the right lower extremity.  DISCHARGE MEDICATIONS:  He was given a prescription for: 1. Phenergan #20. 2. Coumadin per pharmacy. 3. Robaxin #40 with no refills. 4. Percocet 5 mg #40 with no refills.  DIET:  Low-sodium diet.  DISCHARGE INSTRUCTIONS:  He could shower as long as his wound was not draining with assistance.  He is to follow up with Dr. Lequita Halt in the office in 10-12 days.  He could resume his home medications with the exception of ultram and celebrex.  DD:  02/07/01 TD:  02/09/01 Job: [id number]

## 2011-03-02 NOTE — Telephone Encounter (Signed)

## 2011-03-02 NOTE — H&P (Signed)
[name], [name]                 ACCOUNT NO.:  [id number]   MEDICAL RECORD NO.:  [id number]          PATIENT TYPE:  INP   LOCATION:  NA                           FACILITY:  [facility]   PHYSICIAN:  Ollen Gross, M.D.    DATE OF BIRTH:  08-Apr-1961   DATE OF ADMISSION:  11/08/2006  DATE OF DISCHARGE:                              HISTORY & PHYSICAL   DATE OF OFFICE VISIT/HISTORY AND PHYSICAL:  November 01, 2006.   DATE OF ADMISSION:  November 08, 2006.   CHIEF COMPLAINT:  Left knee pain.   HISTORY OF PRESENT ILLNESS:  Patient is a 50 year old male who has been  seen by Dr. Despina Hick for ongoing left knee pain.  He is known to have  significant arthritis with recurring effusions.  He has undergone a  previous left total hip in 2002 and doing well with that.  The left knee  continues to be problematic.  It has reached the point where he would  benefit from undergoing surgical intervention.  Risks and benefits have  been discussed.  He has been seen preoperatively with Dr. Eloise Harman, who  felt there are no medical contraindications for his planned surgery.   ALLERGIES:  PENICILLIN, ERYTHROMYCIN, SULFA.  All three cause rash and  itching.   CURRENT MEDICATIONS:  He has been on a recent course of Avelox, but he  has finished this.  Nasacort, Cozaar.  Relafen, which was stopped before  surgery.  Protonix.  .  Plaquenil.  Hydrochlorothiazide.  Cardizem CD.  Advair.   PAST MEDICAL HISTORY:  History of walking pneumonia years ago.  Rheumatoid arthritis.  Hypertension.  Sleep apnea, which he does use his  CPAP for, and a recent chest cold for which he was recently treated with  Avelox.   PAST SURGICAL HISTORY:  He has undergone a left total hip arthroplasty  in the past.   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  Dips tobacco daily.  No alcohol.   FAMILY HISTORY:  Mother living at age 59 with hypertension.  Father  living at age 4 with a history of hypertension and gout.   REVIEW OF SYSTEMS:  GENERAL:  No fevers, chills, night sweats.  NEURO:  No seizures, syncope, paralysis.  RESPIRATORY:  He does have sleep  apnea, for which he uses CPAP for.  He has been treated with a recent  chest cold with a course of Avelox.  No shortness of breath, productive  cough, or hemoptysis at this time.  CARDIOVASCULAR:  No chest pain,  angina, or orthopnea.  GI:  No nausea, vomiting, diarrhea, or  constipation.  GU:  No dysuria, hematuria, or discharge.  MUSCULOSKELETAL:  Left knee.   PHYSICAL EXAMINATION:  VITAL SIGNS:  Pulse 84, respirations 12, blood  pressure 152/78.  GENERAL:  A 50 year old white male, well-developed, well-nourished,  overweight, obese, in no acute distress.  He is alert, oriented and  cooperative.  Accompanied by his wife.  HEENT:  Normocephalic and atraumatic.  Pupils are round and reactive.  Oropharynx is clear.  EOMs are intact.  NECK:  Supple.  No bruits.  CHEST:  Clear.  HEART:  Regular rate and rhythm with a faint systolic murmur over the  aortic point.  ABDOMEN:  Round, protuberant.  Bowel sounds present.  RECTAL/BREASTS/GENITALIA:  Not done.  Not pertinent to the present  illness.  EXTREMITIES:  Left knee:  The left knee shows range of motion of 0-100  degrees.  Moderate effusion.  No signs of infection.   IMPRESSION:  1. Rheumatoid arthritis, left knee.  2. Hypertension.  3. Sleep apnea, uses CPAP.   PLAN:  Patient admitted to [facility] to undergo a left total  knee replacement arthroplasty.  The surgery will be performed by Dr.  Trudee Grip.  His medical physician is Dr. Jarome Matin, who will be  notified of the room number on admission and be consulted if needed for  medical assistance with the patient throughout the hospital course.      [name], P.A.      Ollen Gross, M.D.  Electronically Signed    ALP/MEDQ  D:  11/07/2006  T:  11/07/2006  Job:  [id number]   cc:   Barry Dienes. Eloise Harman,  M.D.  Fax: [phone]

## 2011-03-02 NOTE — Op Note (Signed)
NAMEKAYLUB, DETIENNE                 ACCOUNT NO.:  [id number]   MEDICAL RECORD NO.:  [id number]          PATIENT TYPE:  INP   LOCATION:  0002                         FACILITY:  [facility]   PHYSICIAN:  Ollen Gross, M.D.    DATE OF BIRTH:  1961-08-26   DATE OF PROCEDURE:  11/08/2006  DATE OF DISCHARGE:                               OPERATIVE REPORT   PREOPERATIVE DIAGNOSIS:  Rheumatoid arthritis, left knee.   POSTOPERATIVE DIAGNOSIS:  Rheumatoid arthritis, left knee.   PROCEDURE:  Left total knee arthroplasty.   SURGEON:  Dr. Lequita Halt.   ASSISTANT:  Avel Peace.   ANESTHESIA:  General with postop Marcaine pain pump.   ESTIMATED BLOOD LOSS:  Minimal.   DRAINS:  Hemovac x1.   TOURNIQUET TIME:  44 minutes at 300 mmHg.   COMPLICATIONS:  None.   CONDITION:  Stable to recovery.   CLINICAL NOTE:  Harrie is a 50 year old male who has severe end-stage  degenerative change of the left knee secondary to rheumatoid arthritis.  He has had recurrent effusions and synovitis.  He has intractable pain  and presents now for left total knee arthroplasty.   PROCEDURE IN DETAIL:  After successful administration of general  anesthetic, the tourniquet was placed high on the left thigh, left lower  extremity prepped and draped in the usual sterile fashion.  Extremities  wrapped in Esmarch, knee flexed, tourniquet inflated to 300 mmHg.  A  midline incision was made with a 10 blade through subcutaneous tissue to  the level of the extensor mechanism.  A fresh blade was used to make a  medial parapatellar arthrotomy.  The soft tissue over the proximal  medial tibia subperiosteally elevated to the joint line with a knife and  into the semimembranosus bursa with a Cobb elevator. The soft tissue  laterally is elevated with attention being paid to avoiding the patellar  tendon on the tibial tubercle. The patella is subluxed laterally, knee  flexed 90 degrees, ACL and PCL removed.  A drill was used to  create a  starting hole in the distal femur and canal was thoroughly irrigated.  A  5 degree left valgus alignment guide is placed and referencing off the  posterior condyles rotation is marked and the block pinned to remove 10  mm off the distal femur.  Distal femoral resection is made with an  oscillating saw.  A sizing block placed, size 3 is most appropriate.  Rotation is marked off the epicondylar axis and a size 3 cutting block  placed.  The anterior, posterior and chamfer cuts are made.   Tibia subluxed forward and menisci are removed. The extramedullary  tibial alignment guide is placed referencing proximally of the medial  aspect of tibial tubercle, distally along the second metatarsal axis  tibial crest.  The Block is pinned to remove about 4-5 mm from the more  deficient lateral side.  Both sides had deficiency.  Tibial resection is  made with an oscillating saw.  Spacer blocks are placed and 12.5 has the  best stability in full extension and 90 degrees of flexion with  excellent rectangular gaps.  The proximal tibia is then prepared with  the modular drill and keel punch for a size 3 and femoral preparation  completed with the intercondylar cut.   A size 3 mobile bearing tibial trial and size 3 posterior stabilized  femoral trial and 12.5 mL posterior stabilized rotating platform insert  trial are placed.  With the 12.5, full extension was achieved with  excellent varus and valgus balance throughout full range of motion.  The  patella was then everted, thickness measured to be 22 mm.  Freehand  resection is taken to 11 mm, 41 template is placed, lug holes were  drilled, trial patella is placed and it tracks normally.  Osteophytes  are removed off the posterior femur with the trial in place.  All trials  are removed and cut bone surfaces are prepared with pulsatile lavage.  The cement is mixed and once ready for implantation, the size 4 mobile  bearing tibial tray, size 3  posterior stabilized femur and 41 patella  are cemented into place and held with the clamp.  Please note that 4 was  the size for the tibia the entire time.  A 12.5 mm trial insert is  placed, knee held in full extension, all extruded cement removed. Once  the cement is fully hardened then the permanent 12.5 mm posterior  stabilized rotating platform insert is placed into the tibial tray. The  wound was copiously irrigated with saline solution and the extensor  mechanism closed over a Hemovac drain with interrupted #1 PDS. A  thorough synovectomy had been performed prior to closure. Flexion  against gravity is about 135 degrees. The tourniquet is then released  with a total time of 44 minutes. The subcu is closed with interrupted 2-  0 Vicryl and subcuticular running 4-0 Monocryl. The drain is hooked to  suction. The catheter for the Marcaine pain pump is placed and the pump  is initiated. Steri-Strips and a bulky sterile dressing are applied. He  is then awakened and transported to recovery in stable condition.      Ollen Gross, M.D.  Electronically Signed     FA/MEDQ  D:  11/08/2006  T:  11/08/2006  Job:  [id number]

## 2011-03-02 NOTE — H&P (Signed)
[place]  Patient:    [name], [name]                          MRN: [id number] Adm. Date:  02/03/01 Attending:  Trudee Grip, M.D. Dictator:   Druscilla Brownie. Shela Nevin, P.A. CC:         Dr. Judithann Sheen, [facility] [phone])   History and Physical  DATE OF BIRTH: 1961/05/05  CHIEF COMPLAINT: "Pain in my left hip."  HISTORY OF PRESENT ILLNESS: This is a 50 year old white male seen by [place] for severe and continuing problems concerning his left hip.  The patient has documented osteoarthritis by x-ray to the left hip.  He also has rheumatoid arthritis as well.  These combined problems have caused deterioration to the left hip to the point where the patient is having extreme difficulty getting about.  He is a relatively young man, who is finding this pain to be severely interfering with his day-to-day activities both at work and his pleasurable time.  After much discussion it was felt the patient would benefit from surgical intervention and is being admitted for total hip replacement arthroplasty of the left hip.  PAST MEDICAL HISTORY: This patient has been in relatively good health throughout his lifetime.  He does have:  1. Rheumatoid arthritis.  2. Hypertension.  3. He has also developed sleep apnea over the years.  He uses a CPAP machine     at home.  CURRENT MEDICATIONS:  1. Ultram 50 mg one b.i.d.  2. Hydrochlorothiazide 25 mg one b.i.d.  3. Celebrex 100 mg one b.i.d.  4. Enbrel two injections per week.  ALLERGIES:  1. PENICILLIN.  2. ERYTHROMYCIN.  PAST SURGICAL HISTORY: Total knee replacement arthroplasty in January 1993.  FAMILY PHYSICIAN: Dr. Judithann Sheen, [facility], [place], [place] [phone]).  SOCIAL HISTORY: The patient is married.  He works as a Chartered certified accountant.  He neither smokes nor drinks.  FAMILY HISTORY: Noncontributory.  REVIEW OF SYSTEMS: CNS: No seizure disorder, paralysis, numbness, or double vision.  RESPIRATORY:  No productive cough, no hemoptysis, no shortness of breath.  CARDIOVASCULAR: No chest pain, no angina, no orthopnea. GASTROINTESTINAL: No nausea, vomiting, melena, or bloody stool. GENITOURINARY: No discharge, dysuria, or hematuria.  MUSCULOSKELETAL: Primarily as in present illness.  Painful range of motion of the left hip and internal and external rotation as well.  PHYSICAL EXAMINATION:  GENERAL: Alert and cooperative, friendly, obese 50 year old white male.  VITAL SIGNS: Blood pressure 138/90, pulse 68, respirations 14.  HEENT: Head normocephalic. PERRLA.  EOMI.  Oropharynx clear.  CHEST: Clear to auscultation.  No rales or rhonchi.  HEART: Regular rate and rhythm.  No murmurs heard.  ABDOMEN: Soft, nontender.  Liver and spleen not felt.  GU/RECTAL: Examinations not done, not pertinent to present illness.  EXTREMITIES: Left hip as in present illness above.  ADMISSION DIAGNOSES:  1. Severe osteoarthritis of left hip.  2. Rheumatoid arthritis.  3. Sleep apnea.  4. Hypertension.  PLAN: The patient will undergo total hip replacement arthroplasty of the left hip.  He will bring his CPAP machine to the hospital with him, which he will use.  He will also bring his Enbrel to the hospital and give his self-administered injections for his rheumatoid arthritis.  Should he have any medical problems in the hospital we will certainly ask an area rheumatologist or internal medicine physician to follow along with [place] during this patients hospitalization. DD:  01/28/01 TD:  01/29/01 Job: [id number]

## 2011-03-02 NOTE — Discharge Summary (Signed)
NAMEFINIS, HENDRICKSEN                 ACCOUNT NO.:  [id number]   MEDICAL RECORD NO.:  [id number]          PATIENT TYPE:  INP   LOCATION:  1510                         FACILITY:  [facility]   PHYSICIAN:  Ollen Gross, M.D.    DATE OF BIRTH:  12/13/1960   DATE OF ADMISSION:  11/08/2006  DATE OF DISCHARGE:  11/11/2006                               DISCHARGE SUMMARY   ADMISSION DIAGNOSES:  1. Rheumatoid arthritis, left knee.  2. Hypertension.  3. Sleep apnea, utilizing CPAP.   DISCHARGE DIAGNOSES:  1. Rheumatoid arthritis, left knee, status post left total knee      arthroplasty.  2. Acute blood-loss anemia, did not require transfusion.  3. Mild postoperative hyponatremia.  4. Mild postoperative hypokalemia.  5. Hypertension.  6. Sleep apnea, utilizing CPAP.   PROCEDURE:  On November 08, 2006, left total knee.   SURGEON:  Ollen Gross, M.D.   ASSISTANT:  Alexzandrew L. Perkins, P.A.-C.   ANESTHESIA:  General.   TOURNIQUET TIME:  Forty-four minutes.   CONSULTS:  None.   BRIEF HISTORY:  [name] is a 50 year old male with severe end-stage  arthritis of the left knee secondary to rheumatoid arthritis.  He has  had recurrent effusion, synovitis, and intractable pain.  Now presents  for total knee.   LABORATORY DATA:  Preop CBC showed a hemoglobin of 12.2, hematocrit  36.8, white cell count 9.2.  Postop hemoglobin 10, drifted down to 9.2.  Last noted H&H 9 and 26.8.  PT/PTT preop 14.5 and 29, respectively.  INR  1.1.  Serial pro times followed:  Last noted PT/INR of 22.5 and 1.9.  Chem panel on admission:  A low albumin of 3.1.  The remaining chem  panel within normal limits.  Sodium did drop from 142 to 134, stabilized  there.  Potassium dropped only a little bit from 3.8 to 3.4.  Preop UA  negative.  Blood group type O+.   Preop chest x-ray dated October 23, 2006:  Stable chest.  No active  disease.   He also had a preop EKG dated October 23, 2006:  Normal sinus rhythm.  Within  normal limits.  Confirmed.   HOSPITAL COURSE:  Patient was admitted to [facility],  tolerated the procedure well, and was later transferred to the recovery  room and then to the orthopedic floor.  Started on PCA and p.o.  analgesics for pain control following surgery.  Actually got some sleep  the night of surgery.  Was doing pretty well on the morning of day #1.  Hemovac drain placed at the time of surgery was pulled.  He was started  on Coumadin for DVT prophylaxis.  He was doing pretty well, weaned over  to p.o. meds.  By day #2, the pain was under excellent control.  PCA was  discontinued.  The dressing was changed.  The incision looked good.  Marcaine pain pump placed at the time of surgery was removed on day #2.  Started getting up with physical therapy.  He ambulating about 100-150  feet that day.  By the following day, he continued  to progress well.  Doing well with his therapy, tolerating his meds.  He was discharged  home later that day.   DISCHARGE PLAN:  1. Patient was discharged home on November 11, 2006.  2. Discharge diagnoses:  Please see above.  3. Discharge meds:  Coumadin, Percocet, Robaxin.  4. Diet:  Resume home diet.  5. Activity:  Weightbearing as tolerated.  Home health PT, home health      nursing, total knee protocol.  6. Followup:  In two weeks.   DISPOSITION:  Home.   CONDITION ON DISCHARGE:  Improved.      [name], P.A.      Ollen Gross, M.D.  Electronically Signed    ALP/MEDQ  D:  11/28/2006  T:  11/28/2006  Job:  [id number]   cc:   Barry Dienes. Eloise Harman, M.D.  Fax: [phone]

## 2011-03-02 NOTE — Discharge Summary (Signed)
[name], [name]                 ACCOUNT NO.:  [id number]   MEDICAL RECORD NO.:  [id number]          PATIENT TYPE:  INP   LOCATION:  5007                         FACILITY:  [facility]   PHYSICIAN:  Barry Dienes. Eloise Harman, M.D.DATE OF BIRTH:  May 09, 1961   DATE OF ADMISSION:  01/13/2005  DATE OF DISCHARGE:  01/15/2005                                 DISCHARGE SUMMARY   PERTINENT FINDINGS:  The patient is a 50 year old white male who is admitted  with an extensive left lower extremity cellulitis.  On the day prior to  admission, he noted some warmth and soreness of the left ankle.  Later that  evening, he was started on Levaquin.  He took 2 doses of 750 mg of Levaquin;  however, the redness and warmth extended up to his knee, so he presented to  the emergency room for evaluation.  He also noted chills with low-grade  tactile temperatures.  He has also had decreased appetite for the past 24  hours prior to admission.  He denied chest pain or shortness of breath.  His  past medical history is significant for chronic bilateral lower extremity  edema, rheumatoid arthritis, osteoarthritis, hypertension, obstructive sleep  apnea for which he is on CPAP, right total knee arthroplasty in 1993, left  total hip replacement in April 2002, cellulitis of both legs in the Fall of  2005.  He has no history of deep vein thrombosis.   ALLERGIES:  PENICILLIN, ERYTHROMYCIN.  Note that cephalosporins have been  well tolerated.   MEDICATIONS PRIOR TO ADMISSION:  1.  Cardizem CD 240 mg daily.  2.  Hydrochlorothiazide 25 mg daily.  3.  Cozaar 50 mg daily.  4.  Levaquin 750 mg daily for the past 2 days.  5.  Enbrel 25 mg twice weekly.  6.  Relafen 500 mg p.r.n.   INITIAL PHYSICAL EXAMINATION:  Blood pressure 118/79, pulse 81, respirations  20, temperature 97.4, pulse oxygen saturation 95% on room air.  IN GENERAL:  He is an obese white male who is in no apparent distress.  CHEST:  Clear to auscultation.  HEART:  Regular rate and rhythm.  ABDOMEN:  Benign.  EXTREMITIES:  There is erythema of the left lower extremity extending from  the left lateral malleolus to the knee.   LABORATORY DATA:  White blood cell count 12.1 with a left shift, hemoglobin  12.8, platelets 308, D-Dimer 1.80.  Serum sodium 139, potassium 3.9,  chloride 106, CO2 24, BUN 10, creatinine 1.1, glucose 109.   HOSPITAL COURSE:  The patient was started on empiric treatment for possible  deep vein thrombosis with Lovenox.  He was also treated with broad-spectrum  IV antibiotics.  The condition of his leg rapidly improved, with decreased  erythema and edema in comparison with lines drawn in the emergency room.  He  also had a deep vein thrombosis ultrasound test of the left lower extremity  done on April 3 that showed no evidence of deep vein thrombosis, superficial  venous thrombosis, or Baker's cyst.   CONDITION ON DISCHARGE:  His left leg was feeling much  better.  He had no  shortness of breath.  His vital signs were normal and he was afebrile.  His  pulse oxygen saturation was 96% on room air.  His white blood cell count was  9.1.   DISCHARGE DIAGNOSES:  1.  Left lower extremity cellulitis.  2.  Chronic venous insufficiency.  3.  Rheumatoid arthritis.  4.  Hypertension.  5.  Exogenous obesity.  6.  Obstructive sleep apnea.   DISCHARGE MEDICATIONS:  1.  Enbrel 25 mg twice weekly to start one month from discharge.  2.  Cardizem CD 240 mg daily.  3.  Hydrochlorothiazide 25 mg daily.  4.  Avelox 400 mg daily for 10 days.  5.  Relafen 500 mg b.i.d. p.r.n. pain.   DIET:  Dietary recommendations:  He is advised to stay on the [employer].   SPECIAL INSTRUCTIONS:  He was advised to call for recurrence of fever,  chills, or worsening redness in his left leg, or difficulty breathing.   FOLLOW UP:  He is advised to schedule a followup appointment with Dr.  Eloise Harman at [facility] within  approximately 2 weeks  following discharge.      DGP/MEDQ  D:  03/14/2005  T:  03/14/2005  Job:  [id number]

## 2011-03-02 NOTE — H&P (Signed)
NAMEJAXSUN, CIAMPI                 ACCOUNT NO.:  [id number]   MEDICAL RECORD NO.:  [id number]          PATIENT TYPE:  EMS   LOCATION:  [facility]                         FACILITY:  [facility]   PHYSICIAN:  Mark A. Perini, M.D.   DATE OF BIRTH:  [birth year]   DATE OF ADMISSION:  01/13/2005  DATE OF DISCHARGE:                                HISTORY & PHYSICAL   CHIEF COMPLAINT:  Left lower extremity red and swollen.   HISTORY OF PRESENT ILLNESS:  Mr. Reier is a pleasant 50 year old male with a  past medical history as listed below.  He noted redness and some warmth and  some soreness about his left ankle yesterday.  Approximately 6 p.m. on January 12, 2005, he was started on Levaquin.  He has taken 2 doses of 750 mg of  Levaquin.  However, the redness and warmth extended up to his knee today,  and he presented to the emergency room.  He had some chills and some low-  grade subjective temperatures; however, he has not felt very ill with this.  His appetite has been decreased for the last 24 hours.  He denies any chest  pain or shortness of breath.  He does have some chronic bilateral lower  extremity edema, which is unchanged.  He is thought to require admission for  IV antibiotics, and to rule out DVT.   PAST MEDICAL HISTORY:  1.  Rheumatoid arthritis since age 62.  2.  Osteoarthritis.  3.  Hypertension for the last 22 years.  4.  Obstructive sleep apnea.  He uses CPAP with a setting of 13 cm of water      pressure at home nightly.  5.  Right total knee arthroplasty in 1993.  6.  Left total hip replacement in April of 2002.  7.  Cellulitis of both legs in the fall of 2005.  He denies any abdominal      surgeries.  He does have some mild chronic edema, and he has some TED      hose at home, but he does not use these regularly.   ALLERGIES:  1.  PENICILLIN causes a rash.  2.  ERYTHROMYCIN causes a rash.   MEDICATIONS:  1.  Cardizem CD 240 mg daily.  2.  HCTZ 25 mg daily.  3.  Cozaar 50 mg  daily.  4.  Levaquin 750 mg daily for the last 2 days.  5.  Enbrel 25 mg twice weekly.  6.  Relafen 500 mg as needed.   In the ER, he has received normal saline and one dose of cephazolin  intravenously, and he appears to be tolerating this well.   SOCIAL HISTORY:  He is married.  His wife's name is Lupita Leash, and she is at the  bedside.  He is a Chartered certified accountant.  He has no tobacco, no alcohol, no drug use  history.  He has 1 son who is 72 years old.   FAMILY HISTORY:  Mother is alive at age 6 with glaucoma.  Father is alive  at age 22 with hypertension and gout.  He  has no siblings.   REVIEW OF SYSTEMS:  As per the history of present illness.  He denies any  problems with his bowel movements.  He has been working on his diet, but has  failed to lose weight lately.  His wife and he have been on a [place]  Diet for 2 weeks.   PHYSICAL EXAMINATION:  VITAL SIGNS:  Blood pressure 118/79, pulse 81, 95%  saturation on room air.  Temperature 97.4 orally.  GENERAL:  He is in no acute distress.  He is alert and oriented x4.  He is  appropriate.  There is no JVD.  He is obese.  LUNGS:  Clear to auscultation bilaterally.  No wheezing, rales, or rhonchi.  HEART:  Regular rate and rhythm with no murmurs, rubs, or gallops.  ABDOMEN:  Soft and nontender.  No masses or hepatosplenomegaly.  EXTREMITIES:  The left lower extremity is warm, red, with some slight  blistering on the anterior portion of the tibia.  This ranges from just  above the ankle to just below the left knee.  There is not really a lot of  involvement over the ankle joint itself.  Therefore, I doubt that there is a  septic joint present.   LABORATORY DATA:  White count 12.1 with a left shift.  Hemoglobin of 12.8,  platelet count 308,000, MVC 87.  D-dimer is somewhat elevated at 1.80.  Sodium 139, potassium 3.9, chloride 106, CO2 of 24, BUN 10, creatinine 1.1,  glucose 109.  The pH was 7.44, PCO2 of 36.   ASSESSMENT AND PLAN:  A  50 year old male with rheumatoid arthritis and some  immunosuppression from his underlying rheumatoid arthritis and also due to  his Enbrel treatment.  He also has mild chronic edema and a previous history  of cellulitis, which predisposes him to recurrent episodes of cellulitis.  I  believe that this is primarily a cellulitis episode, although there is some  possibility of a DVT.  He will therefore be admitted and treated with  intravenous Avelox.  He will be placed on low-dose saline intravenously.  He  will continue on his CPAP therapy as per home.  Will check lower extremity  Doppler on Monday morning.  Will treatment empiric treatment dose Lovenox.  We will check a TSH and hemoglobin A1C.  Will continue his home blood  pressure medications.  In the future, he will need TED hose or compression  stockings to help prevent future episodes of cellulitis if possible.      MAP/MEDQ  D:  01/13/2005  T:  01/13/2005  Job:  [id number]   cc:   Barry Dienes. Eloise Harman, M.D.  [address]  [place]  [place] [address]  Fax: [phone]   Saverio Danker, M.D.  Rheumatologist in [place]

## 2013-01-10 ENCOUNTER — Inpatient Hospital Stay (HOSPITAL_COMMUNITY)
Admission: EM | Admit: 2013-01-10 | Discharge: 2013-01-13 | DRG: 603 | Disposition: A | Discharge status: Home / Self Care | Payer: 59 | Attending: Internal Medicine | Admitting: Internal Medicine

## 2013-01-10 DIAGNOSIS — J45909 Unspecified asthma, uncomplicated: Secondary | ICD-10-CM | POA: Diagnosis present

## 2013-01-10 DIAGNOSIS — T368X5A Adverse effect of other systemic antibiotics, initial encounter: Secondary | ICD-10-CM | POA: Diagnosis present

## 2013-01-10 DIAGNOSIS — Z96659 Presence of unspecified artificial knee joint: Secondary | ICD-10-CM

## 2013-01-10 DIAGNOSIS — Z800 Family history of malignant neoplasm of digestive organs: Secondary | ICD-10-CM

## 2013-01-10 DIAGNOSIS — Z1211 Encounter for screening for malignant neoplasm of colon: Secondary | ICD-10-CM

## 2013-01-10 DIAGNOSIS — Z6841 Body Mass Index (BMI) 40.0 and over, adult: Secondary | ICD-10-CM

## 2013-01-10 DIAGNOSIS — L0390 Cellulitis, unspecified: Secondary | ICD-10-CM

## 2013-01-10 DIAGNOSIS — L02419 Cutaneous abscess of limb, unspecified: Principal | ICD-10-CM | POA: Diagnosis present

## 2013-01-10 DIAGNOSIS — Z96649 Presence of unspecified artificial hip joint: Secondary | ICD-10-CM

## 2013-01-10 DIAGNOSIS — I872 Venous insufficiency (chronic) (peripheral): Secondary | ICD-10-CM | POA: Diagnosis present

## 2013-01-10 DIAGNOSIS — L03115 Cellulitis of right lower limb: Secondary | ICD-10-CM | POA: Diagnosis present

## 2013-01-10 DIAGNOSIS — G4733 Obstructive sleep apnea (adult) (pediatric): Secondary | ICD-10-CM | POA: Diagnosis present

## 2013-01-10 DIAGNOSIS — L299 Pruritus, unspecified: Secondary | ICD-10-CM | POA: Diagnosis present

## 2013-01-10 DIAGNOSIS — E663 Overweight: Secondary | ICD-10-CM | POA: Diagnosis present

## 2013-01-10 DIAGNOSIS — Z79899 Other long term (current) drug therapy: Secondary | ICD-10-CM

## 2013-01-10 DIAGNOSIS — Z881 Allergy status to other antibiotic agents status: Secondary | ICD-10-CM

## 2013-01-10 DIAGNOSIS — I10 Essential (primary) hypertension: Secondary | ICD-10-CM | POA: Diagnosis present

## 2013-01-10 DIAGNOSIS — M069 Rheumatoid arthritis, unspecified: Secondary | ICD-10-CM | POA: Diagnosis present

## 2013-01-10 LAB — CBC WITH DIFFERENTIAL/PLATELET
HCT: 34.6 % — ABNORMAL LOW (ref 39.0–52.0)
Hemoglobin: 11.6 g/dL — ABNORMAL LOW (ref 13.0–17.0)
Lymphs Abs: 1.6 10*3/uL (ref 0.7–4.0)
Monocytes Relative: 3 % (ref 3–12)
Neutro Abs: 9.1 10*3/uL — ABNORMAL HIGH (ref 1.7–7.7)
Neutrophils Relative %: 82 % — ABNORMAL HIGH (ref 43–77)
RBC: 4.16 MIL/uL — ABNORMAL LOW (ref 4.22–5.81)

## 2013-01-10 LAB — BASIC METABOLIC PANEL
BUN: 17 mg/dL (ref 6–23)
CO2: 23 mEq/L (ref 19–32)
Chloride: 104 mEq/L (ref 96–112)
Glucose, Bld: 108 mg/dL — ABNORMAL HIGH (ref 70–99)
Potassium: 3.8 mEq/L (ref 3.5–5.1)

## 2013-01-10 MED ORDER — DIPHENHYDRAMINE HCL 50 MG/ML IJ SOLN
25.0000 mg | Freq: Once | INTRAMUSCULAR | Status: AC
  Administered 2013-01-10: 25 mg via INTRAVENOUS
  Filled 2013-01-10: qty 1

## 2013-01-10 MED ORDER — DEXAMETHASONE SODIUM PHOSPHATE 10 MG/ML IJ SOLN
10.0000 mg | Freq: Once | INTRAMUSCULAR | Status: AC
  Administered 2013-01-10: 10 mg via INTRAVENOUS
  Filled 2013-01-10: qty 1

## 2013-01-10 MED ORDER — SODIUM CHLORIDE 0.9 % IV SOLN
INTRAVENOUS | Status: DC
  Administered 2013-01-10: via INTRAVENOUS

## 2013-01-10 MED ORDER — VANCOMYCIN HCL 10 G IV SOLR
2500.0000 mg | Freq: Once | INTRAVENOUS | Status: AC
  Administered 2013-01-10: 2500 mg via INTRAVENOUS
  Filled 2013-01-10: qty 2500

## 2013-01-10 NOTE — ED Notes (Signed)
C/o allergic reaction to avelox. Reports hives, redness, itching. Reports rash is better but wants help finding something to clear up cellulitis. Has been on clindamycin and avelox.

## 2013-01-10 NOTE — ED Notes (Signed)
Pt states he also wants his cellulitis looked at

## 2013-01-10 NOTE — ED Notes (Signed)
Pt states that rashy welts have gone and that his itching is now much milder

## 2013-01-10 NOTE — ED Notes (Signed)
Pt c/o allergic reaction. States he took 4 tsp liquid diphenhydramine at 1950 pm. Reaction occurred after taking Avalox, for cellulitis. Denies difficulty, swallowing. Was coughing earlier.

## 2013-01-10 NOTE — ED Provider Notes (Signed)
History    This chart was scribed for non-physician practitioner working with Gilda Crease, * by Leone Payor, ED Scribe. This patient was seen in room TR07C/TR07C and the patient's care was started at 2021.   CSN: [id number]  Arrival date & time 01/10/13  2021   None     Chief Complaint  Patient presents with  . Allergic Reaction     The history is provided by the patient. No language interpreter was used.    [name] is a 52 y.o. male who presents to the Emergency Department complaining of a new, constant, gradually improving allergic reaction all over body starting today after taking avalox for his cellulitis. Has taken the generic version of Avalox previously without rxn.  Pt with diffuse bodily itching and hives with associated chills.  Cellulitis on RLE present on and off over the past few months.  Has been treated on OP basis by PCP with several different abx including clindamycin, doxycycline, and avalox without resolution of the cellulitis.  Bilateral THAs and TKAs.  Denies any fever or SOB.  Airway intact.   Past Medical History  Diagnosis Date  . Arthritis   . Asthma   . GERD (gastroesophageal reflux disease)   . Hypertension     Past Surgical History  Procedure Laterality Date  . Replacement total knee bilateral    . Replacement total hip w/  resurfacing implants      bilateral    Family History  Problem Relation Age of Onset  . Colon cancer Mother     History  Substance Use Topics  . Smoking status: Former Games developer  . Smokeless tobacco: Current User    Types: Snuff  . Alcohol Use: No      Review of Systems  Constitutional: Positive for chills. Negative for fever.  HENT: Negative for trouble swallowing.   Cardiovascular: Positive for leg swelling.  Skin: Positive for color change and rash.  All other systems reviewed and are negative.    Allergies  Avelox; Sulfa antibiotics; Erythrocin; and Penicillins  Home Medications   Current  Outpatient Rx  Name  Route  Sig  Dispense  Refill  . ADVAIR DISKUS 250-50 MCG/DOSE AEPB   Oral   Take 1 puff by mouth as needed. Shortness of breath         . diltiazem (CARDIZEM CD) 240 MG 24 hr capsule   Oral   Take 240 mg by mouth Daily.         Marland Kitchen doxycycline (VIBRAMYCIN) 100 MG capsule   Oral   Take 200 mg by mouth as needed. Prior to having dental work         . furosemide (LASIX) 20 MG tablet   Oral   Take 20 mg by mouth daily.         Marland Kitchen HUMIRA 40 MG/0.8ML injection   Injection   Inject 40 mg as directed Once every 2 weeks. Takes it twice a month         . hydroxychloroquine (PLAQUENIL) 200 MG tablet   Oral   Take 200 mg by mouth Daily. arthritis         . losartan (COZAAR) 100 MG tablet   Oral   Take 100 mg by mouth Daily.         . metoprolol (TOPROL-XL) 50 MG 24 hr tablet   Oral   Take 50 mg by mouth Daily.         Marland Kitchen moxifloxacin (AVELOX) 400 MG  tablet   Oral   Take 400 mg by mouth daily.         . nabumetone (RELAFEN) 750 MG tablet   Oral   Take 750 mg by mouth Daily. May take twice a day if needed for pain         . Oxycodone HCl 10 MG TABS   Oral   Take 10 mg by mouth as needed. Pain from surgery         . PROTONIX 40 MG tablet   Oral   Take 40 mg by mouth Daily.           BP 170/82  Pulse 99  Temp(Src) 96.9 F (36.1 C) (Oral)  Resp 20  SpO2 97%  Physical Exam  Nursing note and vitals reviewed. Constitutional: He is oriented to person, place, and time. He appears well-developed and well-nourished.  HENT:  Head: Normocephalic and atraumatic.  Mouth/Throat: Oropharynx is clear and moist.  Eyes: Conjunctivae and EOM are normal. Pupils are equal, round, and reactive to light.  Neck: Normal range of motion.  Cardiovascular: Normal rate, regular rhythm and normal heart sounds.   Pulmonary/Chest: Effort normal and breath sounds normal.  Abdominal: Soft. Bowel sounds are normal.  Musculoskeletal: Normal range of motion.  He exhibits edema.  RLE grossly swollen and erythematous consistent with cellulitis; fungal infection present between R great toe and 2nd toe, distal sensation intact  Neurological: He is alert and oriented to person, place, and time. He has normal strength. No cranial nerve deficit or sensory deficit.  Skin: Skin is warm and dry. Rash noted. Rash is urticarial (diffuse).  Psychiatric: He has a normal mood and affect.    ED Course  Procedures (including critical care time)  DIAGNOSTIC STUDIES: Oxygen Saturation is 97% on room air, adequate by my interpretation.    COORDINATION OF CARE: 10:41 PM Discussed treatment plan with pt at bedside and pt agreed to plan.    Labs Reviewed - No data to display No results found.   1. Family history of malignant neoplasm of gastrointestinal tract   2. Special screening for malignant neoplasms, colon   3. Cellulitis       MDM   Pt evaluated by myself and Dr. Blinda Leatherwood.  Normal allergic rxn with hives, pruritis.  No SOB or airway compromise.  Failed various OP abx therapy for cellulitis- pt will need IP tx.  Labs and blood cultures obtained.  IV decadron, benadryl, and vanc started in the ED. Signed out to Ivonne Andrew for consult to [facility] and admission.     I personally performed the services described in this documentation, which was scribed in my presence. The recorded information has been reviewed and is accurate.   Garlon Hatchet, PA-C 01/11/13 1609

## 2013-01-11 ENCOUNTER — Encounter (HOSPITAL_COMMUNITY): Payer: Self-pay | Admitting: General Practice

## 2013-01-11 LAB — CBC
HCT: 35 % — ABNORMAL LOW (ref 39.0–52.0)
MCHC: 32.3 g/dL (ref 30.0–36.0)
MCV: 86.6 fL (ref 78.0–100.0)
RDW: 13.7 % (ref 11.5–15.5)

## 2013-01-11 LAB — COMPREHENSIVE METABOLIC PANEL
Albumin: 2.7 g/dL — ABNORMAL LOW (ref 3.5–5.2)
BUN: 15 mg/dL (ref 6–23)
Creatinine, Ser: 1.08 mg/dL (ref 0.50–1.35)
Total Protein: 7.8 g/dL (ref 6.0–8.3)

## 2013-01-11 MED ORDER — SODIUM CHLORIDE 0.9 % IV SOLN: 500.0000 mL | INTRAVENOUS | Status: DC

## 2013-01-11 MED ORDER — DILTIAZEM HCL ER COATED BEADS 240 MG PO CP24
240.0000 mg | ORAL_CAPSULE | Freq: Every day | ORAL | Status: DC
  Administered 2013-01-11 – 2013-01-13 (×3): 240 mg via ORAL
  Filled 2013-01-11 (×3): qty 1

## 2013-01-11 MED ORDER — NABUMETONE 750 MG PO TABS
750.0000 mg | ORAL_TABLET | Freq: Two times a day (BID) | ORAL | Status: DC
  Administered 2013-01-11 – 2013-01-13 (×5): 750 mg via ORAL
  Filled 2013-01-11 (×6): qty 1

## 2013-01-11 MED ORDER — ONDANSETRON HCL 4 MG PO TABS: 4.0000 mg | ORAL_TABLET | Freq: Four times a day (QID) | ORAL | Status: DC | PRN

## 2013-01-11 MED ORDER — VANCOMYCIN HCL 10 G IV SOLR
1500.0000 mg | Freq: Two times a day (BID) | INTRAVENOUS | Status: DC
  Administered 2013-01-11 – 2013-01-12 (×4): 1500 mg via INTRAVENOUS
  Filled 2013-01-11 (×6): qty 1500

## 2013-01-11 MED ORDER — LOSARTAN POTASSIUM 50 MG PO TABS
100.0000 mg | ORAL_TABLET | Freq: Every day | ORAL | Status: DC
  Administered 2013-01-11 – 2013-01-13 (×3): 100 mg via ORAL
  Filled 2013-01-11 (×3): qty 2

## 2013-01-11 MED ORDER — METOPROLOL SUCCINATE ER 50 MG PO TB24
50.0000 mg | ORAL_TABLET | Freq: Every day | ORAL | Status: DC
  Administered 2013-01-11 – 2013-01-13 (×3): 50 mg via ORAL
  Filled 2013-01-11 (×3): qty 1

## 2013-01-11 MED ORDER — PANTOPRAZOLE SODIUM 40 MG PO TBEC
40.0000 mg | DELAYED_RELEASE_TABLET | Freq: Every day | ORAL | Status: DC
Start: 2013-01-11 — End: 2013-01-13
  Administered 2013-01-11 – 2013-01-13 (×3): 40 mg via ORAL
  Filled 2013-01-11 (×3): qty 1

## 2013-01-11 MED ORDER — TERBINAFINE HCL 1 % EX CREA
TOPICAL_CREAM | Freq: Two times a day (BID) | CUTANEOUS | Status: DC
Start: 2013-01-11 — End: 2013-01-12
  Administered 2013-01-11 – 2013-01-12 (×4): via TOPICAL
  Filled 2013-01-11: qty 12

## 2013-01-11 MED ORDER — ZOLPIDEM TARTRATE 5 MG PO TABS: 5.0000 mg | ORAL_TABLET | Freq: Every evening | ORAL | Status: DC | PRN

## 2013-01-11 MED ORDER — OXYCODONE HCL 5 MG PO TABS: 5.0000 mg | ORAL_TABLET | ORAL | Status: DC | PRN

## 2013-01-11 MED ORDER — SORBITOL 70 % SOLN: 30.0000 mL | Freq: Every day | Status: DC | PRN

## 2013-01-11 MED ORDER — ONDANSETRON HCL 4 MG/2ML IJ SOLN: 4.0000 mg | Freq: Four times a day (QID) | INTRAMUSCULAR | Status: DC | PRN

## 2013-01-11 MED ORDER — ALBUTEROL SULFATE (5 MG/ML) 0.5% IN NEBU: 2.5000 mg | INHALATION_SOLUTION | RESPIRATORY_TRACT | Status: DC | PRN

## 2013-01-11 MED ORDER — HYDROXYCHLOROQUINE SULFATE 200 MG PO TABS
200.0000 mg | ORAL_TABLET | Freq: Every day | ORAL | Status: DC
  Administered 2013-01-11 – 2013-01-13 (×3): 200 mg via ORAL
  Filled 2013-01-11 (×3): qty 1

## 2013-01-11 MED ORDER — ENOXAPARIN SODIUM 80 MG/0.8ML ~~LOC~~ SOLN
70.0000 mg | SUBCUTANEOUS | Status: DC
  Administered 2013-01-11 – 2013-01-12 (×2): 70 mg via SUBCUTANEOUS
  Filled 2013-01-11 (×3): qty 0.8

## 2013-01-11 MED ORDER — DOCUSATE SODIUM 100 MG PO CAPS
100.0000 mg | ORAL_CAPSULE | Freq: Two times a day (BID) | ORAL | Status: DC
  Administered 2013-01-11 – 2013-01-13 (×5): 100 mg via ORAL
  Filled 2013-01-11 (×7): qty 1

## 2013-01-11 MED ORDER — MOMETASONE FURO-FORMOTEROL FUM 100-5 MCG/ACT IN AERO
2.0000 | INHALATION_SPRAY | Freq: Two times a day (BID) | RESPIRATORY_TRACT | Status: DC
  Administered 2013-01-11 – 2013-01-13 (×5): 2 via RESPIRATORY_TRACT
  Filled 2013-01-11: qty 8.8

## 2013-01-11 MED ORDER — DIPHENHYDRAMINE HCL 25 MG PO CAPS
25.0000 mg | ORAL_CAPSULE | ORAL | Status: DC | PRN
  Administered 2013-01-11 – 2013-01-13 (×6): 25 mg via ORAL
  Filled 2013-01-11 (×6): qty 1

## 2013-01-11 MED ORDER — ACETAMINOPHEN 325 MG PO TABS: 650.0000 mg | ORAL_TABLET | Freq: Four times a day (QID) | ORAL | Status: DC | PRN

## 2013-01-11 MED ORDER — ACETAMINOPHEN 650 MG RE SUPP: 650.0000 mg | Freq: Four times a day (QID) | RECTAL | Status: DC | PRN

## 2013-01-11 MED ORDER — SENNOSIDES-DOCUSATE SODIUM 8.6-50 MG PO TABS: 1.0000 | ORAL_TABLET | Freq: Every evening | ORAL | Status: DC | PRN

## 2013-01-11 MED ORDER — POTASSIUM CHLORIDE IN NACL 20-0.9 MEQ/L-% IV SOLN
INTRAVENOUS | Status: DC
  Administered 2013-01-11 – 2013-01-12 (×3): via INTRAVENOUS
  Filled 2013-01-11 (×5): qty 1000

## 2013-01-11 MED ORDER — FUROSEMIDE 20 MG PO TABS
20.0000 mg | ORAL_TABLET | Freq: Every day | ORAL | Status: DC
  Administered 2013-01-11 – 2013-01-13 (×3): 20 mg via ORAL
  Filled 2013-01-11 (×3): qty 1

## 2013-01-11 NOTE — ED Provider Notes (Signed)
Medical screening examination/treatment/procedure(s) were conducted as a shared visit with non-physician practitioner(s) and myself.  I personally evaluated the patient during the encounter.  Patient presents with significant swelling and erythema of the right lower extremity consistent with persistent cellulitis. Patient has been on 3 separate antibiotics with excellent skin coverage, and but has not completely responded. Patient has just started his third antibiotic and now has acute allergic reaction to it. As patient has had failure of significant outpatient therapy for this cellulitis, he will require inpatient therapy.  Gilda Crease, MD 01/11/13 [phone]

## 2013-01-11 NOTE — Progress Notes (Signed)
Utilization Review Completed.Jayma Volpi T3/30/2014  

## 2013-01-11 NOTE — ED Provider Notes (Signed)
Medical screening examination/treatment/procedure(s) were performed by non-physician practitioner and as supervising physician I was immediately available for consultation/collaboration.   Gilda Crease, MD 01/11/13 [phone]

## 2013-01-11 NOTE — H&P (Signed)
PCP:   Garlan Fillers, MD   Chief Complaint:  Leg swelling and cellulitis, reaction to Avelox  HPI: Patient is a 52 year old male with history of chronic leg issues and cellulitis, last seen in the office a bit over a week ago.  His wife called on his behalf today noting more redness and swelling.  He was called in Avelox, which for some reason was not picked up and taken until close to suppertime despite her call this morning.  Regardless, he took one dose and per his wife had facial redness and itching.  Avelox was one of the few meds that he did not have listed as an allergy and he had completed Clindamycin in the not too ditant past without complete resolution of symptoms.  He presents to the ER having failed an attempt at outpatient meds and now a possible reaction to the Avelox and requires admission.   Review of Systems  General:       Complains of fatigue, sleep disturbance.        Denies fevers, chills, headache, sweats, anorexia, malaise, weight loss.        has sleep apnea and is not on treatment Eyes:       Denies blurring, diplopia, irritation, discharge, vision loss, eye pain, photophobia.   Ears/Nose/Throat:       Denies earache, ear discharge, tinnitus, decreased hearing, nasal congestion, nosebleeds, sore throat, hoarseness, dysphagia.   Cardiovascular:       Complains of peripheral edema.        Denies chest pains, claudication, palpitations, syncope, dyspnea on exertion, orthopnea, PND.   Respiratory:       Denies cough, dyspnea, excessive sputum, hemoptysis, wheezing.   Gastrointestinal:       Denies nausea, vomiting, diarrhea, constipation, heartburn, change in bowel habits, abdominal pain, melena, hematochezia, jaundice.   Genitourinary:       Denies dysuria, hematuria, discharge, urinary frequency, urinary hesitancy, nocturia, incontinence, genital sores, erectile dysfunction, decreased libido.   Musculoskeletal:       Complains of joint pain, stiffness,  arthritis.        Denies back pain, joint swelling, muscle cramps, muscle weakness.   Skin:       Complains of rash.  Facial redness as well as changes on leg including progressive erythema and swelling       Neurologic:       Denies radicular symptoms, weakness, paresthesias, seizures, syncope, tremors, vertigo, dizziness, gait instability.   Psychiatric:       Denies depression, anxiety, memory loss, mental disturbance, suicidal ideation, hallucinations, paranoia.   Endocrine:       Denies cold intolerance, heat intolerance, polydipsia, polyphagia, polyuria, weight change.    Past History Past Medical History (reviewed - no changes required): Rheumatoid Arthritis HTN OSA on CPAP (PRN), 2/09 Polysomnogram showed AHI 9.5, restless legs (?BiPap or Adapt SV candidate), intolerant of BiPAP, oral appliance used Asthma Abnl LFT's with 3/06 Abd US showing fatty liver, and September 2011 right upper quadrant ultrasound not showing hepatobiliary pathology 4/06 Cellulitis, left leg Chronic Right Leg edema with 2005 DVT ultrasound negative for DVT, 3/13 Stasis dermatitis treated with doxycycline Allergies to Penicillin (rash) and Niaspan (flushing)  Physicians involved in care:  Gavin Potters (Rheum), Burnadette Pop Dohmeier, Nanetta Batty   Surgical History (reviewed - no changes required): Remote Right TKR 2002 Left THR 1/06 Hemorrhoidectomy 1/08 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  Family History (reviewed - no changes required):  Father 78, HTN & Gout Mother 36, HTN PGM-DM2 No siblings Children, 1 son Positive family history of DM  Social History (reviewed - no changes required): Married, 1 son Jill Alexanders) in his 78's, [employer] [place], [place]  Medications: Prior to Admission medications   Medication Sig Start Date End Date Taking? Authorizing Provider  ADVAIR DISKUS 250-50 MCG/DOSE AEPB Take 1 puff by mouth as needed. Shortness of breath  12/05/10  Yes Historical Provider, MD  diltiazem (CARDIZEM CD) 240 MG 24 hr capsule Take 240 mg by mouth Daily. 11/12/10  Yes Historical Provider, MD  doxycycline (VIBRAMYCIN) 100 MG capsule Take 200 mg by mouth as needed. Prior to having dental work 12/08/10  Yes Historical Provider, MD  furosemide (LASIX) 20 MG tablet Take 20 mg by mouth daily.   Yes Historical Provider, MD  HUMIRA 40 MG/0.8ML injection Inject 40 mg as directed Once every 2 weeks. Takes it twice a month 12/08/10  Yes Historical Provider, MD  hydroxychloroquine (PLAQUENIL) 200 MG tablet Take 200 mg by mouth Daily. arthritis 01/22/11  Yes Historical Provider, MD  losartan (COZAAR) 100 MG tablet Take 100 mg by mouth Daily. 11/12/10  Yes Historical Provider, MD  metoprolol (TOPROL-XL) 50 MG 24 hr tablet Take 50 mg by mouth Daily. 11/12/10  Yes Historical Provider, MD  moxifloxacin (AVELOX) 400 MG tablet Take 400 mg by mouth daily.   Yes Historical Provider, MD  nabumetone (RELAFEN) 750 MG tablet Take 750 mg by mouth Daily. May take twice a day if needed for pain 01/22/11  Yes Historical Provider, MD  Oxycodone HCl 10 MG TABS Take 10 mg by mouth as needed. Pain from surgery 01/21/11  Yes Historical Provider, MD  PROTONIX 40 MG tablet Take 40 mg by mouth Daily. 12/08/10  Yes Historical Provider, MD    Allergies:   Allergies  Allergen Reactions  . Avelox (Moxifloxacin Hcl In Nacl) Hives, Itching and Other (See Comments)    redness  . Sulfa Antibiotics Itching  . Erythrocin Rash  . Penicillins Rash   Physical Exam: Filed Vitals:   01/10/13 2031  BP: 170/82  Pulse: 99  Temp: 96.9 F (36.1 C)  TempSrc: Oral  Resp: 20  SpO2: 97%   General appearance: alert, cooperative and appears stated age Head: Normocephalic, without obvious abnormality, atraumatic Eyes: conjunctivae/corneas clear. PERRL, EOM's intact.  Nose: Nares normal. Septum midline. Mucosa normal. No drainage or sinus tenderness. Throat: lips, mucosa, and tongue normal;  teeth and gums normal Neck: no adenopathy, no carotid bruit, no JVD and thyroid not enlarged, symmetric, no tenderness/mass/nodules Resp: clear to auscultation bilaterally Cardio: regular rate and rhythm, S1, S2 normal, no murmur, click, rub or gallop GI: soft, non-tender; bowel sounds normal; no masses,  no organomegaly Extremities: extremities normal, atraumatic, no cyanosis or edema Pulses: 2+ and symmetric Lymph nodes: Cervical adenopathy: no cervical lymphadenopathy Neurologic: Alert and oriented X 3, normal strength and tone. Normal symmetric reflexes.     Labs on Admission:   Recent Labs  01/10/13 2309  NA 138  K 3.8  CL 104  CO2 23  GLUCOSE 108*  BUN 17  CREATININE 1.13  CALCIUM 8.7    Recent Labs  01/10/13 2309  WBC 11.1*  NEUTROABS 9.1*  HGB 11.6*  HCT 34.6*  MCV 83.2  PLT 391   Lab Results  Component Value Date   INR 1.08 12/21/2010   INR 1.26 08/26/2010   INR 1.28 08/25/2010    Assessment/Plan Cellulitis, leg:  Will place on IV Vancomycin  and this has been initiated in the ER. AS he has issues with penicillins, erythromycin, sulfa and now Avelox, his coverage is somewhat limited by his allergies.  Could add rifampin, but no clear indication to do so, and will reserve Clinda and doxy for add ons as he has recently completed these drugs for his same issue without resolution.  The issue is also structural and per office notes, he is being set up with a wound center due to his chronic swelling.  Will have Wound Care consult.  Will also get ultrasound to r/o DVT.  He had this done about a year ago as well. Immunosuppression - Plaquenil and Humira.  Blood cultures drawn Rheumatoid Arthritis- Meds as above HTN Elevated, but just had a possible drug reaction and given Decadron. OSA on CPAP (PRN), 2/09 Polysomnogram showed AHI 9.5, restless legs (?BiPap or Adapt SV candidate), intolerant of BiPAP, oral appliance used.   Hopefully his wife has brought it with her for  his use today. Asthma- No exacerbation, continue maintenance meds, albuterol nebs will be added as needed.    Lavayah Vita W 01/11/2013, 12:34 AM

## 2013-01-11 NOTE — Progress Notes (Signed)
Patient was admitted from the ED via stretcher with RN and wife. Patient alert and oriented. Assessment and screenings were completed. Patient and family oriented to the unit, room and staff. Patient still itching and complaining of itching during the assessment. Right lower leg very swollen, red and warm to touch. No complaints of pain. Stated he is hungry. Will check patients orders and get him settled into the room. Will continue to monitor. Abdelaziz Westenberger, RN

## 2013-01-11 NOTE — ED Provider Notes (Signed)
[name] S 12:00AM pt discussed in sign out with Sharilyn Sites PA-C.  Labs pending.  Pt with OP failure to treat rt lower leg cellulitis.  Pt also with allergic rxn to Avelox.  Plan to consult Dr. Eloise Harman for admission.   12:25AM  Labs with slight elevated WBC.  Spoke with Dr. Wylene Simmer on call for Dr. Eloise Harman.  He will put in admission orders from his computer and see pt on rounds in the morning.    Vincent Seller, PA-C 01/11/13 0028

## 2013-01-11 NOTE — Progress Notes (Signed)
ANTIBIOTIC CONSULT NOTE - INITIAL  Pharmacy Consult for vancomycin Indication: cellulitis  Allergies  Allergen Reactions  . Avelox (Moxifloxacin Hcl In Nacl) Hives, Itching and Other (See Comments)    redness  . Sulfa Antibiotics Itching  . Erythrocin Rash  . Penicillins Rash    Patient Measurements: Height: 5\' 8"  (172.7 cm) Weight: 313 lb 14.4 oz (142.384 kg) IBW/kg (Calculated) : 68.4   Vital Signs: Temp: 99.3 F (37.4 C) (03/30 0125) Temp src: Oral (03/30 0125) BP: 110/49 mmHg (03/30 0125) Pulse Rate: 80 (03/30 0125) Intake/Output from previous day:   Intake/Output from this shift:    Labs:  Recent Labs  01/10/13 2309  WBC 11.1*  HGB 11.6*  PLT 391  CREATININE 1.13   Estimated Creatinine Clearance: 106 ml/min (by C-G formula based on Cr of 1.13). No results found for this basename: VANCOTROUGH, VANCOPEAK, VANCORANDOM, GENTTROUGH, GENTPEAK, GENTRANDOM, TOBRATROUGH, TOBRAPEAK, TOBRARND, AMIKACINPEAK, AMIKACINTROU, AMIKACIN,  in the last 72 hours   Microbiology: No results found for this or any previous visit (from the past 720 hour(s)).  Medical History: Past Medical History  Diagnosis Date  . Arthritis   . Asthma   . GERD (gastroesophageal reflux disease)   . Hypertension     Medications:   (Not in a hospital admission) Assessment: 52 yo man to start vancomycin for cellulitis.  He has failed outpt treatment with clindamycin and avelox and has developed an allergic reaction to the avelox.  CrCl ~75 ml/min.  Goal of Therapy:  Vancomycin trough level 10-15 mcg/ml  Plan:  Vancomycin 2500 mg IV X 1 then 1500 mg IV q12 hours. F/u cultures, clinical response and renal function. Check vanc trough when appropriate.  Khalel Alms Poteet 01/11/2013,1:33 AM

## 2013-01-12 DIAGNOSIS — R609 Edema, unspecified: Secondary | ICD-10-CM

## 2013-01-12 LAB — BASIC METABOLIC PANEL
BUN: 15 mg/dL (ref 6–23)
CO2: 24 mEq/L (ref 19–32)
Chloride: 111 mEq/L (ref 96–112)
Creatinine, Ser: 0.81 mg/dL (ref 0.50–1.35)

## 2013-01-12 LAB — CBC
HCT: 30.2 % — ABNORMAL LOW (ref 39.0–52.0)
Hemoglobin: 9.8 g/dL — ABNORMAL LOW (ref 13.0–17.0)
MCV: 85.3 fL (ref 78.0–100.0)
RBC: 3.54 MIL/uL — ABNORMAL LOW (ref 4.22–5.81)
WBC: 13.5 10*3/uL — ABNORMAL HIGH (ref 4.0–10.5)

## 2013-01-12 MED ORDER — TERBINAFINE HCL 1 % EX CREA
TOPICAL_CREAM | Freq: Three times a day (TID) | CUTANEOUS | Status: DC
  Administered 2013-01-12 – 2013-01-13 (×3): via TOPICAL
  Filled 2013-01-12: qty 12

## 2013-01-12 NOTE — Consult Note (Signed)
WOC consult Note Reason for Consult:Consult requested for right leg cellulitis.   Wound type: No open wound or drainage.  Area of cellulitis to right lower leg which was marked with generalized erythremia and edema has receded from previous line which was drawn and is improving.  No need for topical care.  Pt has partial thickness wounds between toes which were previously red and moist, according to wife at bedside.  Lamisil has been ordered by primary team and wounds are currently dry and red without odor or drainage.  Agree with current plan of care. Please re-consult if further assistance is needed.  Thank-you,  Cammie Mcgee MSN, RN, CWOCN, [place], [place] [phone]

## 2013-01-12 NOTE — Progress Notes (Signed)
*  Preliminary Results* Right lower extremity venous duplex completed. Right lower extremity is negative for deep vein thrombosis. No evidence of right Baker's cyst.  01/12/2013 11:58 AM Gertie Fey, RDMS, RDCS

## 2013-01-12 NOTE — Progress Notes (Signed)
Subjective: Right leg is not painful and area between toes is no longer oozing fluid. He has obstructive sleep apnea and is fatigued in the mornings.   Objective: Vital signs in last 24 hours: Temp:  [97.6 F (36.4 C)-98.6 F (37 C)] 97.6 F (36.4 C) (03/31 1308) Pulse Rate:  [55-69] 69 (03/31 1308) Resp:  [18] 18 (03/31 1308) BP: (124-135)/(64-74) 128/64 mmHg (03/31 1308) SpO2:  [99 %-100 %] 100 % (03/31 1308) Weight change:    Intake/Output from previous day: 03/30 0701 - 03/31 0700 In: 3462.5 [P.O.:800; I.V.:1662.5; IV Piggyback:1000] Out: -    General appearance: alert, cooperative and no distress Resp: clear to auscultation bilaterally Cardio: regular rate and rhythm, S1, S2 normal, no murmur, click, rub or gallop GI: soft, non-tender; bowel sounds normal; no masses,  no organomegaly Extremities: bilateral 2+ leg edema, decreased erythema right leg, epithelial breakdown between all toes on the right foot  Lab Results:  Recent Labs  01/11/13 0603 01/12/13 0445  WBC 14.8* 13.5*  HGB 11.3* 9.8*  HCT 35.0* 30.2*  PLT 377 308   BMET  Recent Labs  01/11/13 0603 01/12/13 0445  NA 140 142  K 4.0 4.8  CL 106 111  CO2 22 24  GLUCOSE 174* 149*  BUN 15 15  CREATININE 1.08 0.81  CALCIUM 8.4 8.6   CMET CMP     Component Value Date/Time   NA 142 01/12/2013 0445   K 4.8 01/12/2013 0445   CL 111 01/12/2013 0445   CO2 24 01/12/2013 0445   GLUCOSE 149* 01/12/2013 0445   BUN 15 01/12/2013 0445   CREATININE 0.81 01/12/2013 0445   CALCIUM 8.6 01/12/2013 0445   PROT 7.8 01/11/2013 0603   ALBUMIN 2.7* 01/11/2013 0603   AST 16 01/11/2013 0603   ALT 10 01/11/2013 0603   ALKPHOS 60 01/11/2013 0603   BILITOT 0.3 01/11/2013 0603   GFRNONAA >90 01/12/2013 0445   GFRAA >90 01/12/2013 0445    CBG (last 3)  No results found for this basename: GLUCAP,  in the last 72 hours  INR RESULTS:   Lab Results  Component Value Date   INR 1.08 12/21/2010   INR 1.26 08/26/2010   INR 1.28  08/25/2010     Studies/Results: No results found.  Medications: I have reviewed the patient's current medications.  Assessment/Plan: #1 Cellulitis: much improved and will continue vancomycin IV for now, possible discharge on oral meds tomorrow. #2 Obstructive sleep apnea: will have RT stop by tonight to try CPAP with him.    LOS: 2 days   [name] G 01/12/2013, 1:49 PM

## 2013-01-13 DIAGNOSIS — L03115 Cellulitis of right lower limb: Secondary | ICD-10-CM | POA: Diagnosis present

## 2013-01-13 DIAGNOSIS — G4733 Obstructive sleep apnea (adult) (pediatric): Secondary | ICD-10-CM | POA: Diagnosis present

## 2013-01-13 DIAGNOSIS — M069 Rheumatoid arthritis, unspecified: Secondary | ICD-10-CM | POA: Diagnosis present

## 2013-01-13 DIAGNOSIS — I872 Venous insufficiency (chronic) (peripheral): Secondary | ICD-10-CM | POA: Diagnosis present

## 2013-01-13 LAB — CBC
HCT: 32.9 % — ABNORMAL LOW (ref 39.0–52.0)
MCV: 86.6 fL (ref 78.0–100.0)
RDW: 14 % (ref 11.5–15.5)
WBC: 11.5 10*3/uL — ABNORMAL HIGH (ref 4.0–10.5)

## 2013-01-13 MED ORDER — DOXYCYCLINE HYCLATE 50 MG PO CAPS: 100.0000 mg | ORAL_CAPSULE | Freq: Two times a day (BID) | ORAL | Status: DC

## 2013-01-13 MED ORDER — TERBINAFINE HCL 1 % EX CREA: TOPICAL_CREAM | Freq: Three times a day (TID) | CUTANEOUS | Status: DC

## 2013-01-13 NOTE — Discharge Summary (Signed)
Physician Discharge Summary  Patient ID: [name] MRN: [id number] DOB/AGE: 1961-04-15 52 y.o.  Admit date: 01/10/2013 Discharge date: 01/13/2013   Discharge Diagnoses:  Principal Problem:   Cellulitis of leg, right Active Problems:   Rheumatoid arthritis   Obstructive sleep apnea   Chronic venous insufficiency   Discharged Condition: good  Hospital Course:   Patient is a 52 year old male with history of chronic leg issues and cellulitis, last seen in the office a bit over a week ago. His wife called on his behalf today noting more redness and swelling. He was called in Avelox, which for some reason was not picked up and taken until close to suppertime despite her call this morning. Regardless, he took one dose and per his wife had facial redness and itching. Avelox was one of the few meds that he did not have listed as an allergy and he had completed Clindamycin in the not too ditant past without complete resolution of symptoms. He presents to the ER having failed an attempt at outpatient meds and now a possible reaction to the Avelox and requires admission.  IV vancomycin as well as medication for his rash. He also had a right lower extremities DVT ultrasound exam that did not show evidence of a DVT. An attempt was made to try CPAP inpatient was not well-tolerated. His rash in the right leg improved significantly, and by the day of discharge it was quite reduced. Procedures during his hospitalization included a right DVT ultrasound exam. There were no complications.  Consults: None  Significant Diagnostic Studies:  No results found.  Labs: Lab Results  Component Value Date   WBC 11.5* 01/13/2013   HGB 10.4* 01/13/2013   HCT 32.9* 01/13/2013   MCV 86.6 01/13/2013   PLT 333 01/13/2013     Recent Labs Lab 01/11/13 0603 01/12/13 0445  NA 140 142  K 4.0 4.8  CL 106 111  CO2 22 24  BUN 15 15  CREATININE 1.08 0.81  CALCIUM 8.4 8.6  PROT 7.8  --   BILITOT 0.3  --   ALKPHOS 60  --    ALT 10  --   AST 16  --   GLUCOSE 174* 149*       Lab Results  Component Value Date   INR 1.08 12/21/2010   INR 1.26 08/26/2010   INR 1.28 08/25/2010     Recent Results (from the past 240 hour(s))  CULTURE, BLOOD (ROUTINE X 2)     Status: None   Collection Time    01/10/13 11:15 PM      Result Value Range Status   Specimen Description BLOOD RIGHT ARM   Final   Special Requests BOTTLES DRAWN AEROBIC AND ANAEROBIC 10CC EACH   Final   Culture  Setup Time 01/11/2013 14:52   Final   Culture     Final   Value:        BLOOD CULTURE RECEIVED NO GROWTH TO DATE CULTURE WILL BE HELD FOR 5 DAYS BEFORE ISSUING A FINAL NEGATIVE REPORT   Report Status PENDING   Incomplete  CULTURE, BLOOD (ROUTINE X 2)     Status: None   Collection Time    01/10/13 11:25 PM      Result Value Range Status   Specimen Description BLOOD LEFT ARM   Final   Special Requests BOTTLES DRAWN AEROBIC ONLY 10CC   Final   Culture  Setup Time 01/11/2013 14:52   Final   Culture  Final   Value:        BLOOD CULTURE RECEIVED NO GROWTH TO DATE CULTURE WILL BE HELD FOR 5 DAYS BEFORE ISSUING A FINAL NEGATIVE REPORT   Report Status PENDING   Incomplete      Discharge Exam: Blood pressure 150/82, pulse 60, temperature 97.4 F (36.3 C), temperature source Oral, resp. rate 18, height 5\' 8"  (1.727 m), weight 142.384 kg (313 lb 14.4 oz), SpO2 100.00%.  Physical Exam: In general, he is an overweight white man who was in no apparent distress while lying partially upright in bed. HEENT exam was within normal limits, chest was clear to auscultation, heart had a regular rate and rhythm, abdomen had normal bowel sounds no tenderness, he had bilateral 2+ leg edema with minimal erythema in the distal anterior right leg. He had significant tinea pedis on the right side without exudate or adjacent erythema. Neurological exam was nonfocal.  Disposition: He'll be discharged from the hospital today to continue an outpatient course of  doxycycline 100 mg twice daily for 14 days. He also was advised to continue treatment with Lamisil cream between all toes of the right foot 3 times daily and try to keep his feet as dry as possible and to use his compression hose at all times. Plans are underway for him to have a repeat sleep study for his known obstructive sleep apnea. He should call our office in 1 day to schedule a followup visit with [place] in one week from discharge. He should not take a dose of Humira until his cellulitis has completely resolved.  Discharge Orders   Future Orders Complete By Expires     Call MD for:  As directed     Comments:      Call for fever, chills, severe diarrhea, worsening condition of right leg, or other concerning symptoms    Diet - low sodium heart healthy  As directed     Discharge instructions  As directed     Comments:      Continue to apply lamisil between toes 3 times daily and use compression hose whenever awake.    Increase activity slowly  As directed         Medication List    STOP taking these medications       moxifloxacin 400 MG tablet  Commonly known as:  AVELOX      TAKE these medications       ADVAIR DISKUS 250-50 MCG/DOSE Aepb  Generic drug:  Fluticasone-Salmeterol  Take 1 puff by mouth as needed. Shortness of breath     diltiazem 240 MG 24 hr capsule  Commonly known as:  CARDIZEM CD  Take 240 mg by mouth Daily.     doxycycline 50 MG capsule  Commonly known as:  VIBRAMYCIN  Take 2 capsules (100 mg total) by mouth 2 (two) times daily.     furosemide 20 MG tablet  Commonly known as:  LASIX  Take 20 mg by mouth daily.     HUMIRA 40 MG/0.8ML injection  Generic drug:  adalimumab  Inject 40 mg as directed Once every 2 weeks. Takes it twice a month     hydroxychloroquine 200 MG tablet  Commonly known as:  PLAQUENIL  Take 200 mg by mouth Daily. arthritis     losartan 100 MG tablet  Commonly known as:  COZAAR  Take 100 mg by mouth Daily.     metoprolol  succinate 50 MG 24 hr tablet  Commonly known as:  TOPROL-XL  Take 50 mg by mouth Daily.     nabumetone 750 MG tablet  Commonly known as:  RELAFEN  Take 750 mg by mouth Daily. May take twice a day if needed for pain     Oxycodone HCl 10 MG Tabs  Take 10 mg by mouth as needed. Pain from surgery     PROTONIX 40 MG tablet  Generic drug:  pantoprazole  Take 40 mg by mouth Daily.     terbinafine 1 % cream  Commonly known as:  LAMISIL  Apply topically 3 (three) times daily.         Signed: Garlan Fillers 01/13/2013, 8:29 AM

## 2013-01-17 LAB — CULTURE, BLOOD (ROUTINE X 2): Culture: NO GROWTH

## 2013-01-19 ENCOUNTER — Telehealth: Payer: Self-pay | Admitting: Neurology

## 2013-01-19 DIAGNOSIS — G4710 Hypersomnia, unspecified: Secondary | ICD-10-CM

## 2013-01-19 DIAGNOSIS — G4713 Recurrent hypersomnia: Secondary | ICD-10-CM

## 2013-01-19 DIAGNOSIS — E1159 Type 2 diabetes mellitus with other circulatory complications: Secondary | ICD-10-CM

## 2013-01-19 DIAGNOSIS — G4700 Insomnia, unspecified: Secondary | ICD-10-CM

## 2013-01-19 DIAGNOSIS — I10 Essential (primary) hypertension: Secondary | ICD-10-CM

## 2013-01-19 NOTE — Telephone Encounter (Signed)
Dr. Eloise Harman is referring [name] for CPAP titration.  Wt: 320# Ht: 68 in BMI: 48.66  Diagnoses: OSA Witnessed apneas Snoring Non-restorative Sleep HTN    Medication List: Current Outpatient Prescriptions  Medication Sig Dispense Refill   ADVAIR DISKUS 250-50 MCG/DOSE AEPB Take 1 puff by mouth as needed. Shortness of breath       clindamycin (CLEOCIN) 300 MG capsule Take 300 mg by mouth every 6 (six) hours.       diltiazem (CARDIZEM CD) 240 MG 24 hr capsule Take 240 mg by mouth Daily.       furosemide (LASIX) 20 MG tablet Take 40 mg by mouth daily.        HUMIRA 40 MG/0.8ML injection Inject 40 mg as directed Once every 2 weeks. Takes it twice a month       hydroxychloroquine (PLAQUENIL) 200 MG tablet Take 200 mg by mouth Daily. arthritis       ketoconazole (NIZORAL) 2 % cream Apply 1 application topically 2 (two) times daily.       losartan (COZAAR) 100 MG tablet Take 100 mg by mouth Daily.       metoprolol (TOPROL-XL) 50 MG 24 hr tablet Take 50 mg by mouth Daily.       mometasone (ELOCON) 0.1 % cream Apply 1 application topically 2 (two) times daily.       nabumetone (RELAFEN) 750 MG tablet Take 750 mg by mouth Daily. May take twice a day if needed for pain       Oxycodone HCl 10 MG TABS Take 10 mg by mouth as needed. Pain from surgery       RABEprazole (ACIPHEX) 20 MG tablet Take 20 mg by mouth daily.       doxycycline (VIBRAMYCIN) 50 MG capsule Take 2 capsules (100 mg total) by mouth 2 (two) times daily.  28 capsule  0   PROTONIX 40 MG tablet Take 40 mg by mouth Daily.       terbinafine (LAMISIL) 1 % cream Apply topically 3 (three) times daily.  30 g  2   No current facility-administered medications for this visit.    This patient presents Dr. Eloise Harman with an existing diagnosis of obstructive sleep apnea (2009 - [facility]).  Based on the results of the sleep study, patient was supposed to return for CPAP Titration and he failed to do so.  Dr.  Eloise Harman is again referring the patient for treatment of the osa.

## 2013-01-20 ENCOUNTER — Encounter: Payer: Self-pay | Admitting: Neurology

## 2013-01-20 NOTE — Telephone Encounter (Signed)
This patient qualifies for attended sleep study based on clinical history.  Patient will need a SPLIT at AHI 10 and 4% scoring.  Offer lunesta prn, I will write a paper presciption is  necessary.    CO2 necessary.  Morbidly obese.

## 2013-01-21 MED ORDER — ESZOPICLONE 3 MG PO TABS: 3.0000 mg | ORAL_TABLET | Freq: Every day | ORAL | Status: DC

## 2013-01-21 NOTE — Progress Notes (Signed)
kissa-Please close encounter and release orders,

## 2013-01-22 ENCOUNTER — Other Ambulatory Visit: Payer: Self-pay | Admitting: *Deleted

## 2013-01-22 DIAGNOSIS — G4700 Insomnia, unspecified: Secondary | ICD-10-CM

## 2013-01-22 NOTE — Addendum Note (Signed)
Addended by: Bonita Quin B on: 01/22/2013 03:35 PM   Modules accepted: Orders

## 2013-01-26 ENCOUNTER — Telehealth: Payer: Self-pay | Admitting: *Deleted

## 2013-01-26 NOTE — Telephone Encounter (Signed)
No answer at home 01/26/13 - no voicemail pickup.  Tried work.  LM on number there to call us to modify appt. -sh

## 2013-01-28 ENCOUNTER — Telehealth: Payer: Self-pay | Admitting: *Deleted

## 2013-01-28 NOTE — Telephone Encounter (Signed)
Spoke to spouse, she stated she had called Monday to cancel the Friday night appt and left it on my voicemail.  I asked her if she would like to reschedule now and she said she will have to call back to reschedule later on.  Did not give a reason at this time.  Appt canceled by me now. -sh

## 2014-01-29 ENCOUNTER — Other Ambulatory Visit (HOSPITAL_COMMUNITY): Payer: Self-pay | Admitting: Orthopedic Surgery

## 2014-01-29 DIAGNOSIS — M25561 Pain in right knee: Secondary | ICD-10-CM

## 2014-02-08 ENCOUNTER — Encounter (HOSPITAL_COMMUNITY)
Admission: RE | Admit: 2014-02-08 | Discharge: 2014-02-08 | Disposition: A | Discharge status: Home / Self Care | Payer: No Typology Code available for payment source | Source: Ambulatory Visit | Attending: Orthopedic Surgery | Admitting: Orthopedic Surgery

## 2014-02-08 ENCOUNTER — Ambulatory Visit (HOSPITAL_COMMUNITY)
Admission: RE | Admit: 2014-02-08 | Discharge: 2014-02-08 | Disposition: A | Discharge status: Home / Self Care | Payer: No Typology Code available for payment source | Source: Ambulatory Visit | Attending: Orthopedic Surgery | Admitting: Orthopedic Surgery

## 2014-02-08 DIAGNOSIS — R948 Abnormal results of function studies of other organs and systems: Secondary | ICD-10-CM | POA: Diagnosis present

## 2014-02-08 DIAGNOSIS — Z96659 Presence of unspecified artificial knee joint: Secondary | ICD-10-CM | POA: Diagnosis not present

## 2014-02-08 DIAGNOSIS — M25561 Pain in right knee: Secondary | ICD-10-CM

## 2014-02-08 MED ORDER — TECHNETIUM TC 99M MEDRONATE IV KIT
25.0000 | PACK | Freq: Once | INTRAVENOUS | Status: AC | PRN
  Administered 2014-02-08: 25 via INTRAVENOUS

## 2014-02-11 ENCOUNTER — Encounter (HOSPITAL_COMMUNITY): Payer: Self-pay | Admitting: General Practice

## 2014-02-11 ENCOUNTER — Inpatient Hospital Stay (HOSPITAL_COMMUNITY)
Admission: AD | Admit: 2014-02-11 | Discharge: 2014-02-15 | DRG: 603 | Disposition: A | Discharge status: Home / Self Care | Payer: 59 | Source: Ambulatory Visit | Attending: Internal Medicine | Admitting: Internal Medicine

## 2014-02-11 DIAGNOSIS — L0390 Cellulitis, unspecified: Secondary | ICD-10-CM | POA: Diagnosis present

## 2014-02-11 DIAGNOSIS — I890 Lymphedema, not elsewhere classified: Secondary | ICD-10-CM | POA: Diagnosis present

## 2014-02-11 DIAGNOSIS — E669 Obesity, unspecified: Secondary | ICD-10-CM | POA: Diagnosis present

## 2014-02-11 DIAGNOSIS — Z880 Allergy status to penicillin: Secondary | ICD-10-CM

## 2014-02-11 DIAGNOSIS — G4733 Obstructive sleep apnea (adult) (pediatric): Secondary | ICD-10-CM

## 2014-02-11 DIAGNOSIS — M069 Rheumatoid arthritis, unspecified: Secondary | ICD-10-CM

## 2014-02-11 DIAGNOSIS — D649 Anemia, unspecified: Secondary | ICD-10-CM | POA: Diagnosis present

## 2014-02-11 DIAGNOSIS — K219 Gastro-esophageal reflux disease without esophagitis: Secondary | ICD-10-CM | POA: Diagnosis present

## 2014-02-11 DIAGNOSIS — I10 Essential (primary) hypertension: Secondary | ICD-10-CM | POA: Diagnosis present

## 2014-02-11 DIAGNOSIS — L03115 Cellulitis of right lower limb: Secondary | ICD-10-CM

## 2014-02-11 DIAGNOSIS — J45909 Unspecified asthma, uncomplicated: Secondary | ICD-10-CM | POA: Diagnosis present

## 2014-02-11 DIAGNOSIS — L03319 Cellulitis of trunk, unspecified: Principal | ICD-10-CM

## 2014-02-11 DIAGNOSIS — Z96659 Presence of unspecified artificial knee joint: Secondary | ICD-10-CM

## 2014-02-11 DIAGNOSIS — Z96649 Presence of unspecified artificial hip joint: Secondary | ICD-10-CM

## 2014-02-11 DIAGNOSIS — Z79899 Other long term (current) drug therapy: Secondary | ICD-10-CM

## 2014-02-11 DIAGNOSIS — Z6841 Body Mass Index (BMI) 40.0 and over, adult: Secondary | ICD-10-CM

## 2014-02-11 DIAGNOSIS — L02219 Cutaneous abscess of trunk, unspecified: Principal | ICD-10-CM | POA: Diagnosis present

## 2014-02-11 DIAGNOSIS — I872 Venous insufficiency (chronic) (peripheral): Secondary | ICD-10-CM

## 2014-02-11 HISTORY — DX: Obstructive sleep apnea (adult) (pediatric): G47.33

## 2014-02-11 HISTORY — DX: Cellulitis of unspecified part of limb: L03.119

## 2014-02-11 HISTORY — DX: Rheumatoid arthritis, unspecified: M06.9

## 2014-02-11 LAB — SEDIMENTATION RATE: Sed Rate: 91 mm/hr — ABNORMAL HIGH (ref 0–16)

## 2014-02-11 LAB — BASIC METABOLIC PANEL
BUN: 15 mg/dL (ref 6–23)
CHLORIDE: 101 meq/L (ref 96–112)
CO2: 24 meq/L (ref 19–32)
CREATININE: 0.95 mg/dL (ref 0.50–1.35)
Calcium: 8.8 mg/dL (ref 8.4–10.5)
GFR calc Af Amer: >90 mL/min (ref >90)
GFR calc non Af Amer: >90 mL/min (ref >90)
Glucose, Bld: 118 mg/dL — ABNORMAL HIGH (ref 70–99)
POTASSIUM: 3.7 meq/L (ref 3.7–5.3)
Sodium: 137 mEq/L (ref 137–147)

## 2014-02-11 LAB — CBC
HCT: 34 % — ABNORMAL LOW (ref 39.0–52.0)
Hemoglobin: 10.7 g/dL — ABNORMAL LOW (ref 13.0–17.0)
MCH: 25.3 pg — AB (ref 26.0–34.0)
MCHC: 31.5 g/dL (ref 30.0–36.0)
MCV: 80.4 fL (ref 78.0–100.0)
Platelets: 350 10*3/uL (ref 150–400)
RBC: 4.23 MIL/uL (ref 4.22–5.81)
RDW: 16.2 % — ABNORMAL HIGH (ref 11.5–15.5)
WBC: 14.8 10*3/uL — ABNORMAL HIGH (ref 4.0–10.5)

## 2014-02-11 MED ORDER — ONDANSETRON HCL 4 MG/2ML IJ SOLN: 4.0000 mg | Freq: Four times a day (QID) | INTRAMUSCULAR | Status: DC | PRN

## 2014-02-11 MED ORDER — FUROSEMIDE 40 MG PO TABS
40.0000 mg | ORAL_TABLET | Freq: Every day | ORAL | Status: DC
Start: 2014-02-11 — End: 2014-02-15
  Administered 2014-02-12 – 2014-02-14 (×3): 40 mg via ORAL
  Filled 2014-02-11 (×4): qty 1

## 2014-02-11 MED ORDER — CLINDAMYCIN HCL 300 MG PO CAPS: 300.0000 mg | ORAL_CAPSULE | Freq: Four times a day (QID) | ORAL | Status: DC

## 2014-02-11 MED ORDER — ZOLPIDEM TARTRATE 5 MG PO TABS: 5.0000 mg | ORAL_TABLET | Freq: Every evening | ORAL | Status: DC | PRN

## 2014-02-11 MED ORDER — POLYETHYLENE GLYCOL 3350 17 G PO PACK
17.0000 g | PACK | Freq: Every day | ORAL | Status: DC | PRN
  Filled 2014-02-11: qty 1

## 2014-02-11 MED ORDER — VANCOMYCIN HCL 10 G IV SOLR
2500.0000 mg | Freq: Once | INTRAVENOUS | Status: AC
  Administered 2014-02-11: 2500 mg via INTRAVENOUS
  Filled 2014-02-11: qty 2500

## 2014-02-11 MED ORDER — ACETAMINOPHEN 650 MG RE SUPP: 650.0000 mg | Freq: Four times a day (QID) | RECTAL | Status: DC | PRN

## 2014-02-11 MED ORDER — PANTOPRAZOLE SODIUM 40 MG PO TBEC
40.0000 mg | DELAYED_RELEASE_TABLET | Freq: Every day | ORAL | Status: DC
  Administered 2014-02-12: 40 mg via ORAL
  Filled 2014-02-11: qty 1

## 2014-02-11 MED ORDER — ONDANSETRON HCL 4 MG PO TABS: 4.0000 mg | ORAL_TABLET | Freq: Four times a day (QID) | ORAL | Status: DC | PRN

## 2014-02-11 MED ORDER — ACETAMINOPHEN 325 MG PO TABS
650.0000 mg | ORAL_TABLET | Freq: Four times a day (QID) | ORAL | Status: DC | PRN
  Administered 2014-02-12 – 2014-02-13 (×3): 650 mg via ORAL
  Filled 2014-02-11 (×3): qty 2

## 2014-02-11 MED ORDER — DILTIAZEM HCL ER COATED BEADS 240 MG PO CP24
240.0000 mg | ORAL_CAPSULE | Freq: Every day | ORAL | Status: DC
  Administered 2014-02-12 – 2014-02-14 (×3): 240 mg via ORAL
  Filled 2014-02-11 (×4): qty 1

## 2014-02-11 MED ORDER — LOSARTAN POTASSIUM 50 MG PO TABS
100.0000 mg | ORAL_TABLET | Freq: Every day | ORAL | Status: DC
  Administered 2014-02-12 – 2014-02-14 (×3): 100 mg via ORAL
  Filled 2014-02-11 (×4): qty 2

## 2014-02-11 MED ORDER — MORPHINE SULFATE 2 MG/ML IJ SOLN
2.0000 mg | INTRAMUSCULAR | Status: DC | PRN
Start: 2014-02-11 — End: 2014-02-15

## 2014-02-11 MED ORDER — NABUMETONE 750 MG PO TABS
750.0000 mg | ORAL_TABLET | Freq: Two times a day (BID) | ORAL | Status: DC
  Filled 2014-02-11 (×3): qty 1

## 2014-02-11 MED ORDER — METOPROLOL SUCCINATE ER 50 MG PO TB24
50.0000 mg | ORAL_TABLET | Freq: Every day | ORAL | Status: DC
  Administered 2014-02-12 – 2014-02-14 (×3): 50 mg via ORAL
  Filled 2014-02-11 (×4): qty 1

## 2014-02-11 MED ORDER — ENOXAPARIN SODIUM 40 MG/0.4ML ~~LOC~~ SOLN
40.0000 mg | SUBCUTANEOUS | Status: DC
  Administered 2014-02-11 – 2014-02-14 (×4): 40 mg via SUBCUTANEOUS
  Filled 2014-02-11 (×5): qty 0.4

## 2014-02-11 MED ORDER — VANCOMYCIN HCL 10 G IV SOLR
1250.0000 mg | Freq: Two times a day (BID) | INTRAVENOUS | Status: DC
  Administered 2014-02-12 – 2014-02-14 (×6): 1250 mg via INTRAVENOUS
  Filled 2014-02-11 (×8): qty 1250

## 2014-02-11 MED ORDER — MOMETASONE FURO-FORMOTEROL FUM 100-5 MCG/ACT IN AERO
2.0000 | INHALATION_SPRAY | Freq: Two times a day (BID) | RESPIRATORY_TRACT | Status: DC
  Administered 2014-02-11 – 2014-02-15 (×8): 2 via RESPIRATORY_TRACT
  Filled 2014-02-11 (×2): qty 8.8

## 2014-02-11 MED ORDER — HYDROCODONE-ACETAMINOPHEN 5-325 MG PO TABS: 1.0000 | ORAL_TABLET | ORAL | Status: DC | PRN

## 2014-02-11 NOTE — Progress Notes (Addendum)
ANTIBIOTIC CONSULT NOTE - INITIAL  Pharmacy Consult for Vancomycin Indication: Cellulitis  Allergies  Allergen Reactions  . Avelox [Moxifloxacin Hcl In Nacl] Hives, Itching and Other (See Comments)    redness  . Sulfa Antibiotics Itching  . Erythrocin Rash  . Penicillins Rash    Patient Measurements: Height: 5\' 8"  (172.7 cm) Weight: 298 lb (135.172 kg) IBW/kg (Calculated) : 68.4 Adjusted Body Weight:   Vital Signs: Temp: 98.9 F (37.2 C) (04/30 2149) Temp src: Oral (04/30 2149) BP: 125/75 mmHg (04/30 2149) Pulse Rate: 82 (04/30 2149) Intake/Output from previous day:   Intake/Output from this shift:    Labs:  Recent Labs  02/11/14 2111  WBC 14.8*  HGB 10.7*  PLT 350  CREATININE 0.95   Estimated Creatinine Clearance: 121 ml/min (by C-G formula based on Cr of 0.95). No results found for this basename: VANCOTROUGH, VANCOPEAK, VANCORANDOM, GENTTROUGH, GENTPEAK, GENTRANDOM, TOBRATROUGH, TOBRAPEAK, TOBRARND, AMIKACINPEAK, AMIKACINTROU, AMIKACIN,  in the last 72 hours   Microbiology: No results found for this or any previous visit (from the past 720 hour(s)).  Medical History: Past Medical History  Diagnosis Date  . Arthritis   . Asthma   . GERD (gastroesophageal reflux disease)   . Hypertension     Medications:  Prescriptions prior to admission  Medication Sig Dispense Refill  . ADVAIR DISKUS 250-50 MCG/DOSE AEPB Inhale 1 puff into the lungs daily as needed (shortness of breath).       . clotrimazole (LOTRIMIN) 1 % cream Apply 1 application topically daily as needed (athletes foot).      2112 diltiazem (CARDIZEM CD) 240 MG 24 hr capsule Take 240 mg by mouth Daily.      Marland Kitchen doxycycline (VIBRAMYCIN) 100 MG capsule Take 100 mg by mouth 2 (two) times daily.      . furosemide (LASIX) 20 MG tablet Take 40 mg by mouth daily.       . hydroxychloroquine (PLAQUENIL) 200 MG tablet Take 200 mg by mouth 2 (two) times daily. arthritis      . ibuprofen (ADVIL,MOTRIN) 200 MG  tablet Take 400-600 mg by mouth every 6 (six) hours as needed for headache (pain).      Marland Kitchen losartan (COZAAR) 100 MG tablet Take 100 mg by mouth Daily.      . metoprolol (TOPROL-XL) 50 MG 24 hr tablet Take 50 mg by mouth Daily.      . Oxycodone HCl 10 MG TABS Take 10 mg by mouth daily as needed (pain).       . predniSONE (DELTASONE) 5 MG tablet Take 5 mg by mouth daily with breakfast.       . RABEprazole (ACIPHEX) 20 MG tablet Take 20 mg by mouth daily.       Assessment: 53yom to start Vancomycin for cellulitis of RLE now spreading to abdomen/hip which has failed Doxycycline treatment.  - Baseline labs have been ordered - Weight: 135kg  Goal of Therapy:  Vancomycin trough level 10-15 mcg/ml  Plan:  1. Will give Vancomycin 2.5g IV x 1 2. BMET has been ordered - will order subsequent doses once renal function is known   Marland Kitchen [facility] 02/11/2014,6:45 PM  Addendum: SCr: 0.95 CrCl: >100 ml/min (~91 normalized)  Plan: 1. Vancomycin 1.25g IV q12h 2. Monitor renal function, cultures, clinical course and order Vancomycin trough at [facility], PharmD Clinical Pharmacist [phone] 02/11/2014, 10:34 PM

## 2014-02-11 NOTE — H&P (Signed)
Vital Signs  Entered weight:  298  lbs., Calculated Weight: 298 lbs., ( 135.17 kg) Height: 68 in., ( 172.72 cm) Temperature: 97.4 deg F, Temperature site: oral Pulse rate: 88 Pulse rhythm: regular  Blood Pressure #1: 114 / 62 mm Hg    BMI: 45.31 BSA: 2.42 Wt Chg: -15 lbs since 08/12/2013  Vitals entered by: Prudencio Pair, CMA on February 11, 2014 4:31 PM        Risk Factors:   Smoked Tobacco Use:  Never smoker Passive smoke exposure:  yes Drug use:  no HIV high-risk behavior:  no Caffeine use:  2 drinks per day Alcohol use:  no Exercise:  no Seatbelt use:  100 % Sun Exposure:  occasionally  Previous Tobacco Use: Signed On - 08/12/2013 Smoked Tobacco Use:  Never smoker Passive smoke exposure:  yes Drug use:  no HIV high-risk behavior:  no Caffeine use:  2 drinks per day  Previous Alcohol Use: Signed On - 08/12/2013 Alcohol use:  no Exercise:  no Seatbelt use:  100 % Sun Exposure:  occasionally  Colonoscopy History:    Date of Last Colonoscopy:  03/01/2011  History of Present Illness  History from: patient Reason for visit: See chief complaint Chief Complaint: Patient started Doxycycline 02/03/14 for Cellulitis and since then it is not only on his right foot but on his right side abdomen and around to his right hip and very red.  History of Present Illness: 53 y/o M presents with progressive erythema up abd and back for treatment for cellulitis which has been treated with Doxy since 02/03/14 for known recurrent cellulitis, usually on  R side.  Has had multiple hospitalizations for same req IV abx due to multuple abx allergies. Had skin bx c/w cellulitis in past 18 months.  Last treatment with abx approx 9 months ago  Hx RA on Humira and Pred 62m daily , [facility]quienil[facility] prior to progression of sx.     At onset, sx started out with chills, temp 101 and rash around lower R leg and called in Doxy 1054mbid was started and  thought getting better as chills and fever improved and rash on leg improved, but now progressed to abd and now around to back and worse than ever been.  doesn't feel responding to Doxy now No drainage to site, worsening pain in joints to R side.    Allergies:PCN, SULFA, AVELOX, KEFLEX, EES, KETEC and Niaspan    Review of Systems  General:       Complains of fevers, chills.        Denies headache, anorexia, fatigue, malaise, sleep disturbance, weight loss.   Eyes:       Denies irritation, discharge.   Ears/Nose/Throat:       Denies earache, ear discharge, tinnitus, decreased hearing, nasal congestion, nosebleeds.   Cardiovascular:       Denies chest pains, claudication, palpitations, syncope, dyspnea on exertion, orthopnea, peripheral edema.   Respiratory:       Denies cough, dyspnea, excessive sputum, hemoptysis, wheezing.   Gastrointestinal:       Denies nausea, vomiting, diarrhea, constipation, heartburn, abdominal pain.   Genitourinary:       Denies dysuria, hematuria, discharge, urinary frequency, urinary hesitancy, nocturia, incontinence.   Musculoskeletal:       Complains of joint pain, stiffness, arthritis.        Denies back pain.   Skin:       Complains of rash.  Neurologic:       Denies vertigo, dizziness.   Endocrine:       Denies polydipsia, polyphagia, polyuria.   Heme/Lymphatic:       Denies abnormal bruising, bleeding, enlarged lymph nodes.    Past History Past Medical History (reviewed - no changes required): Rheumatoid Arthritis HTN OSA on CPAP (PRN), 2/09 Polysomnogram showed AHI 9.5, restless legs (?BiPap or Adapt SV candidate), intolerant of BiPAP, oral appliance used Asthma Abnl LFT's with 3/06 Abd US showing fatty liver, and September 2011 right upper quadrant ultrasound not showing hepatobiliary pathology 4/06 Cellulitis, left leg Chronic Right Leg edema with 2005 DVT ultrasound negative for DVT, 3/13 Stasis dermatitis treated with  doxycycline Allergies to Penicillin (rash) and Niaspan (flushing)  Physicians involved in care:  Jefm Bryant (Rheum), Lawernce Ion Dohmeier, Quay Burow   Surgical History (reviewed - no changes required): Remote Right TKR 2002 Left THR 1/06 Hemorrhoidectomy 1/08 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  Family History (reviewed - no changes required): Father 34, HTN & Gout Mother 15, HTN PGM-DM2 No siblings Children, 1 son  Positive family history of DM  Family History Summary:     Reviewed history Last on 08/12/2013 and no changes required:02/11/2014 Father Ailene Ravel.) - Has Family History of Hypertension - Entered On: 08/12/2013  General Comments - FH: Father 56, HTN & Gout Mother 56, HTN PGM-DM2 No siblings Children, 1 son  Positive family history of DM   Physical Exam  General appearance: overweight, well hydrated, no acute distress  Eyes  External: conjunctivae and lids normal  Ears, Nose and Throat  Pharynx: tongue normal, protrudes mid line,  posterior pharynx without erythema or exudate  Neck  Neck: supple, no masses, trachea midline  Respiratory  Respiratory effort: no intercostal retractions or use of accessory muscles Auscultation: no rales, rhonchi, or wheezes  Cardiovascular  Auscultation: S1, S2, no murmur, rub, or gallop Periph. circulation: bilateral 2+ leg edema right more than left,   Gastrointestinal  Abdomen: soft, non-tender, no masses, bowel sounds normal with bright red erythematous rash over R lateral abd to post back and R ant upper thigh  Lymphatic  Neck: no cervical adenopathy  Musculoskeletal  Gait and station: normal  Skin  Inspection: bright red, warm extensive rash over R lateral abd to buttock to ant thigh  without vesicular lesions  Neurologic  Reflexes: 2+, symmetric Sensation: intact to touch, vibration Speech: Normal  Mental Status Exam  Judgment, insight:  intact Orientation: oriented to time, place, and person Memory: intact   Impression & Recommendations:  Problem # 1:  Cellulitis, leg, right (ICD-682.6) (ICD10-L03.115) Assessment: Deteriorated  The following medications were removed from the medication list:    Doxycycline Hyclate 50 Mg Caps (Doxycycline hyclate) .Marland Kitchen... Take two capsules by mouth two times daily  His updated medication list for this problem includes:    Doxycycline Hyclate 100 Mg Oral Caps (Doxycycline hyclate) .Marland Kitchen... Take one tablet by mouth twice daily Reviewed with Dr Brigitte Pulse, rec admission and treatment with IV abx Dr Brigitte Pulse assessed pt and reviewed plan with pt and wife, they are agreeable will be admitted, [facility] to [facility] for treatment  Problem # 2:  ARTHRITIS, RHEUMATOID (ICD-714.0) (ICD10-M06.9) Assessment: Unchanged  His updated medication list for this problem includes:    Prednisone 5 Mg Oral Tabs (Prednisone) .Marland Kitchen... Take 1 tablet by mouth every day    Humira 40 Mg/0.37m Kit (Adalimumab) ..... QDeeann Saint**on hold** On Prednsione, Dr SBrigitte Pulseaware  Problem #  3:  ASTHMA (ICD-493.90) (ICD10-J45.909) Assessment: Unchanged  His updated medication list for this problem includes:    Prednisone 5 Mg Oral Tabs (Prednisone) .Marland Kitchen... Take 1 tablet by mouth every day    Advair Diskus 250-50 Mcg/dose Aepb (Fluticasone-salmeterol) ..... One puff twice daily   Problem # 4:  OBSTRUCTIVE SLEEP APNEA (ICD-327.23) (ICD10-G47.33) Assessment: Unchanged  Problem # 5:  HYPERTENSION (ICD-401.9) (ICD10-I10) Assessment: Unchanged  The following medications were removed from the medication list:    Hydrochlorothiazide 25 Mg Tabs (Hydrochlorothiazide) .Marland Kitchen... Take one tablet by mouth every day  His updated medication list for this problem includes:    Furosemide 40 Mg Tabs (Furosemide) .Marland Kitchen... Take 1 tablet by mouth every morning    Cardizem Cd 240 Mg Xr24h-cap (Diltiazem hcl coated beads) .Marland Kitchen... 1 tab po qd    Losartan Potassium 100 Mg  Tabs (Losartan potassium) .Marland Kitchen... Take 1 tablet by mouth daily    Toprol Xl 50 Mg Xr24h-tab (Metoprolol succinate) .Marland Kitchen... Take one tablet by mouth once daily   Problem # 6:  LIVER FUNCTION TESTS, ABNORMAL, HX OF (ICD-V12.2) Assessment: Improved Last LFT's showed AST/ALT 13/!11 in Oct 2014  Medications Added to Medication List This Visit: 1)  Prednisone 5 Mg Oral Tabs (Prednisone) .... Take 1 tablet by mouth every day 2)  Plaquenil 200 Mg Tabs (Hydroxychloroquine sulfate) .... Take 2 tablets by mouth every day 3)  Humira 40 Mg/0.84m Kit (Adalimumab) .... Q2weeks **on hold**  Complete Medication List: 1)  Doxycycline Hyclate 100 Mg Oral Caps (Doxycycline hyclate) .... Take one tablet by mouth twice daily 2)  Prednisone 5 Mg Oral Tabs (Prednisone) .... Take 1 tablet by mouth every day 3)  Furosemide 40 Mg Tabs (Furosemide) .... Take 1 tablet by mouth every morning 4)  Oxycodone Hcl 10 Mg Tabs (Oxycodone hcl) .... Take 1-2 tablets by mouth every 4-6 hours as needed for moderate pain 5)  Advair Diskus 250-50 Mcg/dose Aepb (Fluticasone-salmeterol) .... One puff twice daily 6)  Cardizem Cd 240 Mg Xr24h-cap (Diltiazem hcl coated beads) ..Marland Kitchen. 1 tab po qd 7)  Aciphex 20 Mg Tbec (Rabeprazole sodium) .... Take one tablet by mouth every day 8)  Plaquenil 200 Mg Tabs (Hydroxychloroquine sulfate) .... Take 2 tablets by mouth every day 9)  Humira 40 Mg/0.816mKit (Adalimumab) .... Q2weeks **on hold** 10)  Losartan Potassium 100 Mg Tabs (Losartan potassium) .... Take 1 tablet by mouth daily 11)  Toprol Xl 50 Mg Xr24h-tab (Metoprolol succinate) .... Take one tablet by mouth once daily  Electronically signed by SuTacy Learnrinkard FNP on 02/11/2014 at 5:21 PM  I personally reviewed the history and examined the patient and agree with documentation as above.  [age]ear old white male with a history of rheumatoid arthritis on immunosuppressant therapy with chronic lymphedema presenting with increasing erythema, warmth,  and tenderness of pannus.  He is nontoxic appearing with no fever, or severe pain.  However, his cellulitis has not responded to oral doxycycline.  He will require admission for IV antibiotic.  We'll use vancomycin due to his multiple antibiotic allergies.

## 2014-02-11 NOTE — Progress Notes (Signed)
[name] [id number] Code Status: FULL  Admission Data: 02/11/2014 7:51 PM Attending Provider:  Paterson GLO:VFIEPPIR,JJOACZ G, MD Consults/ Treatment Team:  Internal Medicine  [name] is a 53 y.o. male patient direct admission. awake, alert - oriented  X 3 - no acute distress noted.  VSS - Blood pressure 130/82, pulse 96, temperature 98.3 F (36.8 C), temperature source Oral, resp. rate 18, height 5\' 8"  (1.727 m), weight 135.172 kg (298 lb), SpO2 96.00%.    IV access attempted. MD notified and orders. Orientation to room, and floor completed with information packet given to patient/family.  Patient declined safety video at this time.  Admission INP armband ID verified with patient/family, and in place.   SR up x 2, fall assessment complete, with patient and family able to verbalize understanding of risk associated with falls, and verbalized understanding to call nsg before up out of bed.  Call light within reach, patient able to voice, and demonstrate understanding.  Skin, clean-dry-without evidence of bruising, or skin tears. Cellulitis noted on lower abdomen.  No evidence of skin break down noted on exam.     Will cont to eval and treat per MD orders.  , RN 02/11/2014 7:51 PM

## 2014-02-12 ENCOUNTER — Inpatient Hospital Stay (HOSPITAL_COMMUNITY): Payer: 59

## 2014-02-12 ENCOUNTER — Encounter (HOSPITAL_COMMUNITY): Payer: Self-pay | Admitting: Radiology

## 2014-02-12 LAB — CBC
HEMATOCRIT: 30.1 % — AB (ref 39.0–52.0)
HEMOGLOBIN: 9.3 g/dL — AB (ref 13.0–17.0)
MCH: 24.7 pg — ABNORMAL LOW (ref 26.0–34.0)
MCHC: 30.9 g/dL (ref 30.0–36.0)
MCV: 79.8 fL (ref 78.0–100.0)
Platelets: 322 10*3/uL (ref 150–400)
RBC: 3.77 MIL/uL — ABNORMAL LOW (ref 4.22–5.81)
RDW: 16.4 % — AB (ref 11.5–15.5)
WBC: 11.9 10*3/uL — ABNORMAL HIGH (ref 4.0–10.5)

## 2014-02-12 LAB — BASIC METABOLIC PANEL
BUN: 12 mg/dL (ref 6–23)
CALCIUM: 8.2 mg/dL — AB (ref 8.4–10.5)
CO2: 24 mEq/L (ref 19–32)
Chloride: 102 mEq/L (ref 96–112)
Creatinine, Ser: 0.91 mg/dL (ref 0.50–1.35)
GFR calc Af Amer: >90 mL/min (ref >90)
GFR calc non Af Amer: >90 mL/min (ref >90)
GLUCOSE: 95 mg/dL (ref 70–99)
POTASSIUM: 4 meq/L (ref 3.7–5.3)
Sodium: 137 mEq/L (ref 137–147)

## 2014-02-12 LAB — C-REACTIVE PROTEIN: CRP: 17.1 mg/dL — ABNORMAL HIGH (ref <0.60)

## 2014-02-12 MED ORDER — IOHEXOL 300 MG/ML  SOLN
25.0000 mL | INTRAMUSCULAR | Status: AC
  Administered 2014-02-12 (×2): 25 mL via ORAL

## 2014-02-12 MED ORDER — PANTOPRAZOLE SODIUM 40 MG PO TBEC
40.0000 mg | DELAYED_RELEASE_TABLET | Freq: Every day | ORAL | Status: DC
  Administered 2014-02-13 – 2014-02-14 (×2): 40 mg via ORAL
  Filled 2014-02-12 (×2): qty 1

## 2014-02-12 MED ORDER — IOHEXOL 300 MG/ML  SOLN
100.0000 mL | Freq: Once | INTRAMUSCULAR | Status: AC | PRN
  Administered 2014-02-12: 100 mL via INTRAVENOUS

## 2014-02-12 MED ORDER — SODIUM CHLORIDE 0.9 % IJ SOLN
10.0000 mL | INTRAMUSCULAR | Status: DC | PRN
  Administered 2014-02-13 – 2014-02-15 (×5): 10 mL

## 2014-02-12 MED ORDER — PREDNISONE 5 MG PO TABS
5.0000 mg | ORAL_TABLET | Freq: Every day | ORAL | Status: DC
  Administered 2014-02-12 – 2014-02-15 (×4): 5 mg via ORAL
  Filled 2014-02-12 (×5): qty 1

## 2014-02-12 MED ORDER — CLINDAMYCIN PHOSPHATE 900 MG/50ML IV SOLN
900.0000 mg | Freq: Three times a day (TID) | INTRAVENOUS | Status: DC
  Administered 2014-02-12 – 2014-02-15 (×9): 900 mg via INTRAVENOUS
  Filled 2014-02-12 (×11): qty 50

## 2014-02-12 NOTE — Progress Notes (Signed)
Subjective: Increased redness over abdomen/pannus with mild pain ("like a sunburn").  No fever, chills, sweats, severe pain.  Objective: Vital signs in last 24 hours: Temp:  [98.3 F (36.8 C)-99.1 F (37.3 C)] 99.1 F (37.3 C) (05/01 0455) Pulse Rate:  [72-96] 72 (05/01 1032) Resp:  [18] 18 (05/01 0455) BP: (117-143)/(70-82) 143/78 mmHg (05/01 1032) SpO2:  [94 %-98 %] 98 % (05/01 1010) Weight:  [135.172 kg (298 lb)] 135.172 kg (298 lb) (04/30 1700) Weight change:  Last BM Date: 02/11/14  CBG (last 3)  No results found for this basename: GLUCAP,  in the last 72 hours  Intake/Output from previous day:   Intake/Output this shift:    General appearance: alert and no distress Eyes: no scleral icterus Throat: oropharynx moist without erythema Resp: clear to auscultation bilaterally Cardio: regular rate and rhythm GI: soft, non-tender; bowel sounds normal; no masses,  no organomegaly Extremities: no clubbing, cyanosis or edema Skin: increasing erythema, warmth over lower abdomen (extending to umbilicus); minimal tenderness.  No significant induration   Lab Results:  Recent Labs  02/11/14 2111 02/12/14 0612  NA 137 137  K 3.7 4.0  CL 101 102  CO2 24 24  GLUCOSE 118* 95  BUN 15 12  CREATININE 0.95 0.91  CALCIUM 8.8 8.2*    Recent Labs  02/11/14 2111 02/12/14 0612  WBC 14.8* 11.9*  HGB 10.7* 9.3*  HCT 34.0* 30.1*  MCV 80.4 79.8  PLT 350 322   Lab Results  Component Value Date   INR 1.08 12/21/2010   INR 1.26 08/26/2010   INR 1.28 08/25/2010    Studies/Results: No results found.   Medications: Scheduled: . clindamycin (CLEOCIN) IV  900 mg Intravenous 3 times per day  . diltiazem  240 mg Oral Daily  . enoxaparin (LOVENOX) injection  40 mg Subcutaneous Q24H  . furosemide  40 mg Oral Daily  . losartan  100 mg Oral Daily  . metoprolol succinate  50 mg Oral Daily  . mometasone-formoterol  2 puff Inhalation BID  . nabumetone  750 mg Oral BID  . [START ON  02/13/2014] pantoprazole  40 mg Oral Daily  . predniSONE  5 mg Oral Q breakfast  . vancomycin  1,250 mg Intravenous Q12H   Continuous:   Assessment/Plan: Active Problems: 1. Cellulitis, abdomen/panniculitis- has failed outpatient Doxycycline and has increasing erythema despite Vancomycin X 2 doses.  Low suspicion for Necrotizing fasciitis but given immunosuppression and worsening will obtain CT abdomen/pelvis with contrast to rule out underlying abscess/complication.  Additional options limited by multiple allergies.  Will add Clindamycin IV.  Monitor clinically for improvement prior to transition to home with IV antibiotics. 2. Rheumatoid Arthritis on immunosuppression- continue Prednisone.   3. Chronic Lymphedema, leg- elevate legs 4. Hypertension- continue home medications 5. GERD- prefers Aciphex over Protonix which has not been effective in the past. 6. Disposition- anticipate discharge to home with IV antibiotics in 2-3 days.     LOS: 1 day   Martha Clan 02/12/2014, 10:52 AM

## 2014-02-12 NOTE — Care Management Note (Signed)
    Page 1 of 1   02/15/2014     11:26:21 AM CARE MANAGEMENT NOTE 02/15/2014  Patient:  [name], [name]   Account Number:  [id number]  Date Initiated:  02/12/2014  Documentation initiated by:  Letha Cape  Subjective/Objective Assessment:   dx cellulits of abd  admit- lives with spouse.     Action/Plan:   Anticipated DC Date:  02/15/2014   Anticipated DC Plan:  HOME/SELF CARE      DC Planning Services  CM consult      Choice offered to / List presented to:             Status of service:  Completed, signed off Medicare Important Message given?  NO (If response is "NO", the following Medicare IM given date fields will be blank) Date Medicare IM given:   Date Additional Medicare IM given:    Discharge Disposition:  HOME/SELF CARE  Per UR Regulation:  Reviewed for med. necessity/level of care/duration of stay  If discussed at Long Length of Stay Meetings, dates discussed:    Comments:  02/15/14 1124  Letha Cape RN, BSN [phone] patient for dc to home today with spouse, per MD dc summary will switch patient to po abx, but will leave picc line in in case they need it for future use, wife will flush picc since she already does this with her mother.  No NCM referral, no needs anticipated.

## 2014-02-12 NOTE — Progress Notes (Signed)
Utilization review completed.  

## 2014-02-12 NOTE — Progress Notes (Signed)
Peripherally Inserted Central Catheter/Midline Placement  The IV Nurse has discussed with the patient and/or persons authorized to consent for the patient, the purpose of this procedure and the potential benefits and risks involved with this procedure.  The benefits include less needle sticks, lab draws from the catheter and patient may be discharged home with the catheter.  Risks include, but not limited to, infection, bleeding, blood clot (thrombus formation), and puncture of an artery; nerve damage and irregular heat beat.  Alternatives to this procedure were also discussed.  PICC/Midline Placement Documentation        [name] 02/12/2014, 9:50 AM

## 2014-02-13 LAB — BASIC METABOLIC PANEL
BUN: 11 mg/dL (ref 6–23)
CHLORIDE: 108 meq/L (ref 96–112)
CO2: 24 mEq/L (ref 19–32)
Calcium: 8.3 mg/dL — ABNORMAL LOW (ref 8.4–10.5)
Creatinine, Ser: 0.86 mg/dL (ref 0.50–1.35)
GFR calc non Af Amer: >90 mL/min (ref >90)
Glucose, Bld: 103 mg/dL — ABNORMAL HIGH (ref 70–99)
POTASSIUM: 3.5 meq/L — AB (ref 3.7–5.3)
SODIUM: 143 meq/L (ref 137–147)

## 2014-02-13 LAB — CBC
HEMATOCRIT: 29.1 % — AB (ref 39.0–52.0)
HEMOGLOBIN: 9.2 g/dL — AB (ref 13.0–17.0)
MCH: 25.4 pg — ABNORMAL LOW (ref 26.0–34.0)
MCHC: 31.6 g/dL (ref 30.0–36.0)
MCV: 80.4 fL (ref 78.0–100.0)
Platelets: 296 10*3/uL (ref 150–400)
RBC: 3.62 MIL/uL — ABNORMAL LOW (ref 4.22–5.81)
RDW: 16.4 % — AB (ref 11.5–15.5)
WBC: 10 10*3/uL (ref 4.0–10.5)

## 2014-02-13 NOTE — Progress Notes (Signed)
Subjective: Overall feels well minimal tenderness involving the area of cellulitis. Good bowel habits. No nausea vomiting. No actual intra-abdominal pain.  Objective: Vital signs in last 24 hours: Temp:  [97.5 F (36.4 C)-98.1 F (36.7 C)] 97.7 F (36.5 C) (05/02 0428) Pulse Rate:  [67-77] 70 (05/02 0935) Resp:  [18] 18 (05/02 0428) BP: (105-132)/(63-78) 132/78 mmHg (05/02 0935) SpO2:  [96 %-98 %] 96 % (05/02 0428) Weight change:   CBG (last 3)  No results found for this basename: GLUCAP,  in the last 72 hours  Intake/Output from previous day: 05/01 0701 - 05/02 0700 In: 444 [P.O.:444] Out: -   Physical Exam: Awake alert no distress seems calm No JVD or bruits good facial symmetry Lungs are clear Cardiac vascular regular rate and rhythm no murmur Abdomen is soft nontender obese good bowel sounds Extremities no cyanosis clubbing or edema negative Homans sign Skin does reveal increasing erythema and warmth over the lower abdomen it has extended beyond yesterday's margins no fluctuance or crepitus   Lab Results:  Recent Labs  02/12/14 0612 02/13/14 0500  NA 137 143  K 4.0 3.5*  CL 102 108  CO2 24 24  GLUCOSE 95 103*  BUN 12 11  CREATININE 0.91 0.86  CALCIUM 8.2* 8.3*   No results found for this basename: AST, ALT, ALKPHOS, BILITOT, PROT, ALBUMIN,  in the last 72 hours  Recent Labs  02/12/14 0612 02/13/14 0500  WBC 11.9* 10.0  HGB 9.3* 9.2*  HCT 30.1* 29.1*  MCV 79.8 80.4  PLT 322 296   Lab Results  Component Value Date   INR 1.08 12/21/2010   INR 1.26 08/26/2010   INR 1.28 08/25/2010   No results found for this basename: CKTOTAL, CKMB, CKMBINDEX, TROPONINI,  in the last 72 hours No results found for this basename: TSH, T4TOTAL, FREET3, T3FREE, THYROIDAB,  in the last 72 hours No results found for this basename: VITAMINB12, FOLATE, FERRITIN, TIBC, IRON, RETICCTPCT,  in the last 72 hours  Studies/Results: Ct Abdomen Pelvis W Contrast  02/12/2014    CLINICAL DATA:  Abdominal pain and burning sensation on skin.  EXAM: CT ABDOMEN AND PELVIS WITH CONTRAST  TECHNIQUE: Multidetector CT imaging of the abdomen and pelvis was performed using the standard protocol following bolus administration of intravenous contrast.  CONTRAST:  OMNIPAQUE IOHEXOL 300 MG/ML  SOLN  COMPARISON:  None.  FINDINGS: The lung bases are clear. There is no pleural or pericardial effusion. The heart size appears normal.  No focal liver abnormality identified. The gallbladder appears normal. No biliary dilatation. Normal appearance of the pancreas. The spleen is normal.  The adrenal glands are both normal. The kidneys are both on unremarkable. Small renal cysts are identified. The urinary bladder is on unremarkable. Beam hardening artifact from bilateral hip arthroplasty devices significantly diminishes detail within the pelvis including the bladder, prostate gland and rectum.  Normal caliber of the abdominal aorta. No aneurysm. There is no upper abdominal adenopathy identified. Right common iliac lymph node measures 8 mm. Right external iliac node is borderline enlarged measuring 10 mm. More distally there is an external iliac node measuring 1.8 cm.  The stomach is normal. The small bowel loops have a normal course and caliber without evidence for obstruction. Normal appearance of the proximal colon. Multiple distal colonic diverticula are identified without acute inflammation. Review of the visualized osseous structures is significant for mild multi level lumbar degenerative disc disease. Facet hypertrophy and degenerative changes also noted within the lower lumbar  spine. There is skin thickening and subcutaneous fat stranding involving the ventral abdominal wall and skin overlying the right hip region.  IMPRESSION: 1. No acute findings within the abdomen or pelvis. 2. Borderline enlarged right iliac and right inguinal lymph nodes. 3. Body wall cellulitis.   Electronically Signed   By:  Signa Kell M.D.   On: 02/12/2014 19:08     Assessment/Plan: #1 panniculitis/cellulitis CT is clearly reassuring and white blood cell count has asked to come down some given the recent addition to clindamycin less than 24 hours ago and the reassuring CT and clinical picture we'll continue the current course as his only been 24 hours  #2 rheumatoid arthritis on immunosuppressants  #3 chronic lower [place] edema benign at baseline  #4 essential hypertension stable  And #5 gastroesophageal reflux disease   LOS: 2 days   Minda Meo 02/13/2014, 10:38 AM

## 2014-02-13 NOTE — Progress Notes (Signed)
MD notified via answering service that patient has increased redness on abdomen compared to yesterday. Pt still has complaints of burning, but stated pain is relieved with tylenol.

## 2014-02-14 LAB — CBC
HCT: 35 % — ABNORMAL LOW (ref 39.0–52.0)
Hemoglobin: 11.1 g/dL — ABNORMAL LOW (ref 13.0–17.0)
MCH: 25.6 pg — ABNORMAL LOW (ref 26.0–34.0)
MCHC: 31.7 g/dL (ref 30.0–36.0)
MCV: 80.8 fL (ref 78.0–100.0)
Platelets: 381 10*3/uL (ref 150–400)
RBC: 4.33 MIL/uL (ref 4.22–5.81)
RDW: 16.2 % — ABNORMAL HIGH (ref 11.5–15.5)
WBC: 10.6 10*3/uL — ABNORMAL HIGH (ref 4.0–10.5)

## 2014-02-14 LAB — BASIC METABOLIC PANEL
BUN: 8 mg/dL (ref 6–23)
CHLORIDE: 104 meq/L (ref 96–112)
CO2: 25 mEq/L (ref 19–32)
Calcium: 8.8 mg/dL (ref 8.4–10.5)
Creatinine, Ser: 0.85 mg/dL (ref 0.50–1.35)
GFR calc non Af Amer: >90 mL/min (ref >90)
GLUCOSE: 88 mg/dL (ref 70–99)
Potassium: 4 mEq/L (ref 3.7–5.3)
Sodium: 140 mEq/L (ref 137–147)

## 2014-02-14 NOTE — Progress Notes (Signed)
Subjective: [name] has been doing better overall. He is hoping for discharge however this is the first day he progression of the panniculitis/cellulitis has halted and seems to precede a bit. He arty has a PICC line is prepared for discharge potentially tomorrow. No nausea vomiting minimal tenderness  Objective: Vital signs in last 24 hours: Temp:  [97.7 F (36.5 C)-98.4 F (36.9 C)] 97.8 F (36.6 C) (05/03 0501) Pulse Rate:  [66-81] 76 (05/03 0921) Resp:  [17-18] 17 (05/03 0501) BP: (102-148)/(61-88) 148/88 mmHg (05/03 0921) SpO2:  [96 %-97 %] 96 % (05/03 0840) Weight change:   CBG (last 3)  No results found for this basename: GLUCAP,  in the last 72 hours  Intake/Output from previous day: 05/02 0701 - 05/03 0700 In: 676 [P.O.:666; I.V.:10] Out: -   Physical Exam:  Awake alert no distress seems calm  No JVD or bruits good facial symmetry  Lungs are clear  Cardiac vascular regular rate and rhythm no murmur  Abdomen is soft nontender obese good bowel sounds  Extremities no cyanosis clubbing or edema negative Homans sign  Skin reveals recession of the erythema from the demarcation line and some lightening throughout so clearly responds. No crepitus no fluctuance     Lab Results:  Recent Labs  02/13/14 0500 02/14/14 0614  NA 143 140  K 3.5* 4.0  CL 108 104  CO2 24 25  GLUCOSE 103* 88  BUN 11 8  CREATININE 0.86 0.85  CALCIUM 8.3* 8.8   No results found for this basename: AST, ALT, ALKPHOS, BILITOT, PROT, ALBUMIN,  in the last 72 hours  Recent Labs  02/13/14 0500 02/14/14 0614  WBC 10.0 10.6*  HGB 9.2* 11.1*  HCT 29.1* 35.0*  MCV 80.4 80.8  PLT 296 381   Lab Results  Component Value Date   INR 1.08 12/21/2010   INR 1.26 08/26/2010   INR 1.28 08/25/2010   No results found for this basename: CKTOTAL, CKMB, CKMBINDEX, TROPONINI,  in the last 72 hours No results found for this basename: TSH, T4TOTAL, FREET3, T3FREE, THYROIDAB,  in the last 72 hours No  results found for this basename: VITAMINB12, FOLATE, FERRITIN, TIBC, IRON, RETICCTPCT,  in the last 72 hours  Studies/Results: Ct Abdomen Pelvis W Contrast  02/12/2014   CLINICAL DATA:  Abdominal pain and burning sensation on skin.  EXAM: CT ABDOMEN AND PELVIS WITH CONTRAST  TECHNIQUE: Multidetector CT imaging of the abdomen and pelvis was performed using the standard protocol following bolus administration of intravenous contrast.  CONTRAST:  OMNIPAQUE IOHEXOL 300 MG/ML  SOLN  COMPARISON:  None.  FINDINGS: The lung bases are clear. There is no pleural or pericardial effusion. The heart size appears normal.  No focal liver abnormality identified. The gallbladder appears normal. No biliary dilatation. Normal appearance of the pancreas. The spleen is normal.  The adrenal glands are both normal. The kidneys are both on unremarkable. Small renal cysts are identified. The urinary bladder is on unremarkable. Beam hardening artifact from bilateral hip arthroplasty devices significantly diminishes detail within the pelvis including the bladder, prostate gland and rectum.  Normal caliber of the abdominal aorta. No aneurysm. There is no upper abdominal adenopathy identified. Right common iliac lymph node measures 8 mm. Right external iliac node is borderline enlarged measuring 10 mm. More distally there is an external iliac node measuring 1.8 cm.  The stomach is normal. The small bowel loops have a normal course and caliber without evidence for obstruction. Normal appearance of the  proximal colon. Multiple distal colonic diverticula are identified without acute inflammation. Review of the visualized osseous structures is significant for mild multi level lumbar degenerative disc disease. Facet hypertrophy and degenerative changes also noted within the lower lumbar spine. There is skin thickening and subcutaneous fat stranding involving the ventral abdominal wall and skin overlying the right hip region.  IMPRESSION: 1.  No acute findings within the abdomen or pelvis. 2. Borderline enlarged right iliac and right inguinal lymph nodes. 3. Body wall cellulitis.   Electronically Signed   By: Signa Kell M.D.   On: 02/12/2014 19:08     Assessment/Plan:  #1 panniculitis/cellulitis CT is clearly reassuring and white blood cell count has asked to come down some given the recent addition to clindamycin less than 24 hours ago and the reassuring CT and clinical picture we'll continue the current course as his only been 24 hours - given today's improvement will discontinue the vancomycin since the Cleocin seemed to made the biggest difference and hope that the peer for transition home on IV Cleocin #2 rheumatoid arthritis on immunosuppressants  #3 chronic lower [place] edema benign at baseline  #4 essential hypertension stable  And #5 gastroesophageal reflux disease   LOS: 3 days   Minda Meo 02/14/2014, 10:23 AM

## 2014-02-14 NOTE — Progress Notes (Signed)
ANTIBIOTIC CONSULT NOTE - FOLLOW UP  Pharmacy Consult for vancomycin Indication: cellulitis  Allergies  Allergen Reactions  . Avelox [Moxifloxacin Hcl In Nacl] Hives, Itching and Other (See Comments)    redness  . Sulfa Antibiotics Itching  . Erythrocin Rash  . Penicillins Rash    Patient Measurements: Height: 5\' 8"  (172.7 cm) Weight: 298 lb (135.172 kg) IBW/kg (Calculated) : 68.4  Vital Signs: Temp: 97.8 F (36.6 C) (05/03 0501) Temp src: Other (Comment) (05/03 0501) BP: 148/88 mmHg (05/03 0921) Pulse Rate: 76 (05/03 0921) Intake/Output from previous day: 05/02 0701 - 05/03 0700 In: 676 [P.O.:666; I.V.:10] Out: -  Intake/Output from this shift:    Labs:  Recent Labs  02/12/14 0612 02/13/14 0500 02/14/14 0614  WBC 11.9* 10.0 10.6*  HGB 9.3* 9.2* 11.1*  PLT 322 296 381  CREATININE 0.91 0.86 0.85   Estimated Creatinine Clearance: 135.2 ml/min (by C-G formula based on Cr of 0.85). No results found for this basename: VANCOTROUGH, VANCOPEAK, VANCORANDOM, GENTTROUGH, GENTPEAK, GENTRANDOM, TOBRATROUGH, TOBRAPEAK, TOBRARND, AMIKACINPEAK, AMIKACINTROU, AMIKACIN,  in the last 72 hours   Microbiology: No results found for this or any previous visit (from the past 720 hour(s)).  Anti-infectives   Start     Dose/Rate Route Frequency Ordered Stop   02/12/14 1400  clindamycin (CLEOCIN) IVPB 900 mg     900 mg 100 mL/hr over 30 Minutes Intravenous 3 times per day 02/12/14 1052     02/12/14 0800  vancomycin (VANCOCIN) 1,250 mg in sodium chloride 0.9 % 250 mL IVPB     1,250 mg 166.7 mL/hr over 90 Minutes Intravenous Every 12 hours 02/11/14 2236     02/11/14 1930  vancomycin (VANCOCIN) 2,500 mg in sodium chloride 0.9 % 500 mL IVPB     2,500 mg 250 mL/hr over 120 Minutes Intravenous  Once 02/11/14 1848 02/11/14 2210   02/11/14 1800  clindamycin (CLEOCIN) capsule 300 mg  Status:  Discontinued     300 mg Oral Every 6 hours 02/11/14 1750 02/11/14 1855      Assessment: 53 yo  male on vancomycin and clindamycin for panniculitis/cellulitis.  WBC= 10.6, afebrile, SCr= 0.85, CrCl> 100. Noted plans for d/c vancomycin.  Goal of Therapy:  Vancomycin trough level 10-15 mcg/ml  Plan:  -No vancomycin levels with current plans for d/c -Vancomycin remains on the patient medication regimen. Consider d/c today?  40, Pharm D 02/14/2014 2:45 PM

## 2014-02-15 DIAGNOSIS — L03319 Cellulitis of trunk, unspecified: Principal | ICD-10-CM

## 2014-02-15 DIAGNOSIS — D649 Anemia, unspecified: Secondary | ICD-10-CM | POA: Diagnosis present

## 2014-02-15 DIAGNOSIS — L02219 Cutaneous abscess of trunk, unspecified: Principal | ICD-10-CM | POA: Diagnosis present

## 2014-02-15 LAB — BASIC METABOLIC PANEL
BUN: 10 mg/dL (ref 6–23)
CO2: 26 mEq/L (ref 19–32)
Calcium: 8.5 mg/dL (ref 8.4–10.5)
Chloride: 105 mEq/L (ref 96–112)
Creatinine, Ser: 0.79 mg/dL (ref 0.50–1.35)
GFR calc Af Amer: >90 mL/min (ref >90)
GLUCOSE: 85 mg/dL (ref 70–99)
Potassium: 4.3 mEq/L (ref 3.7–5.3)
Sodium: 141 mEq/L (ref 137–147)

## 2014-02-15 LAB — CBC
HEMATOCRIT: 34.1 % — AB (ref 39.0–52.0)
Hemoglobin: 10.7 g/dL — ABNORMAL LOW (ref 13.0–17.0)
MCH: 25.4 pg — ABNORMAL LOW (ref 26.0–34.0)
MCHC: 31.4 g/dL (ref 30.0–36.0)
MCV: 81 fL (ref 78.0–100.0)
Platelets: 385 10*3/uL (ref 150–400)
RBC: 4.21 MIL/uL — ABNORMAL LOW (ref 4.22–5.81)
RDW: 16.3 % — ABNORMAL HIGH (ref 11.5–15.5)
WBC: 9.3 10*3/uL (ref 4.0–10.5)

## 2014-02-15 MED ORDER — CLINDAMYCIN HCL 300 MG PO CAPS: 600.0000 mg | ORAL_CAPSULE | Freq: Three times a day (TID) | ORAL | Status: DC

## 2014-02-15 MED ORDER — ACETAMINOPHEN 325 MG PO TABS: 650.0000 mg | ORAL_TABLET | Freq: Four times a day (QID) | ORAL | Status: DC | PRN

## 2014-02-15 MED ORDER — HEPARIN SOD (PORK) LOCK FLUSH 100 UNIT/ML IV SOLN
250.0000 [IU] | INTRAVENOUS | Status: AC | PRN
  Administered 2014-02-15: 250 [IU]

## 2014-02-15 NOTE — Progress Notes (Signed)
DC home with wife. Verbally understood DC instructions. No questions ask

## 2014-02-15 NOTE — Discharge Summary (Addendum)
Physician Discharge Summary  Patient ID: [name] MRN: [id number] DOB/AGE: May 27, 1961 53 y.o.  Admit date: 02/11/2014 Discharge date: 02/15/2014   Discharge Diagnoses:  Principal Problem:   Cellulitis and abscess of trunk Active Problems:   Anemia   Cellulitis   Discharged Condition: good  Hospital Course: 53 y/o M presents with progressive erythema up abd and back for treatment for cellulitis which has been treated with Doxy since 02/03/14 for known recurrent cellulitis, usually on. He has had multiple hospitalizations for recurrent cellulitis of the lower extremities.  Unfortunately, he has a penicillin allergy that limits options.  He also uses a lymphedema pump as well as compression hose on a regular basis.  He presented to the emergency room with fever, chills, and worsening right leg erythema.  He also had evidence of lower abdominal wall cellulitis.  He had a CT scan of the abdomen and pelvis that showed no acute findings but did show borderline enlarged right iliac and right inguinal region lymph nodes as well as body wall cellulitis.  He was initially started on IV vancomycin which did not change the appearance of the area of cellulitis to any extent, so he was switched to IV clindamycin with resultant decreased erythema and edema of the abdominal wall.  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  On the day of discharge he was eating and drinking well and was not having problems with diarrhea, fever, chills, or abdominal pain.  He had a PICC line placed in the right arm during his hospitalization as well.  There were no complications from his hospitalization.   Consults: None  Significant Diagnostic Studies:  No results found.  Labs: Lab Results  Component Value Date   WBC 9.3 02/15/2014   HGB 10.7* 02/15/2014   HCT  34.1* 02/15/2014   MCV 81.0 02/15/2014   PLT 385 02/15/2014      Recent Labs Lab 02/15/14 0501  NA 141  K 4.3  CL 105  CO2 26  BUN 10  CREATININE 0.79  CALCIUM 8.5  GLUCOSE 85       Lab Results  Component Value Date   INR 1.08 12/21/2010   INR 1.26 08/26/2010   INR 1.28 08/25/2010     No results found for this or any previous visit (from the past 240 hour(s)).    Discharge Exam: Blood pressure 127/74, pulse 71, temperature 97.6 F (36.4 C), temperature source Oral, resp. rate 17, height 5\' 8"  (1.727 m), weight 135.172 kg (298 lb), SpO2 96.00%.  Physical Exam: In general, the patient is an overweight white man who was in no apparent distress while sitting at the side of his bed.  HEENT exam was within normal limits, neck was supple without jugular venous distention, chest was clear to auscultation, heart had a regular rate and rhythm and was without significant murmur or gallop, abdomen had normal bowel sounds and no hepatosplenomegaly or tenderness.  Along the lower abdominal wall was an area of faint erythema and induration that was below the line of initial demarcation in the emergency room.  There is no significant tenderness and no crepitus.  He was alert and well oriented with normal affect and was able to walk without any difficulty.  Disposition:he will be discharged to home today.  Although he has a PICC line in place, he has requested returning  to work as soon as possible, so we will switch him to moderate dose oral clindamycin and keep the PICC line in place for now should be needed in the near future.  His wife understands how to maintain a PICC line as she does this with her mother, and she will flush his PICC line with saline once daily.      Discharge Orders   Future Orders Complete By Expires   Call MD for:  As directed    Diet - low sodium heart healthy  As directed    Discharge instructions  As directed    Discharge wound care:  As directed    Increase activity  slowly  As directed        Medication List    STOP taking these medications       doxycycline 100 MG capsule  Commonly known as:  VIBRAMYCIN      TAKE these medications       acetaminophen 325 MG tablet  Commonly known as:  TYLENOL  Take 2 tablets (650 mg total) by mouth every 6 (six) hours as needed for mild pain (or Fever >/= 101).     ADVAIR DISKUS 250-50 MCG/DOSE Aepb  Generic drug:  Fluticasone-Salmeterol  Inhale 1 puff into the lungs daily as needed (shortness of breath).     clindamycin 300 MG capsule  Commonly known as:  CLEOCIN  Take 2 capsules (600 mg total) by mouth 3 (three) times daily.     clotrimazole 1 % cream  Commonly known as:  LOTRIMIN  Apply 1 application topically daily as needed (athletes foot).     diltiazem 240 MG 24 hr capsule  Commonly known as:  CARDIZEM CD  Take 240 mg by mouth Daily.     furosemide 20 MG tablet  Commonly known as:  LASIX  Take 40 mg by mouth daily.     hydroxychloroquine 200 MG tablet  Commonly known as:  PLAQUENIL  Take 200 mg by mouth 2 (two) times daily. arthritis     ibuprofen 200 MG tablet  Commonly known as:  ADVIL,MOTRIN  Take 400-600 mg by mouth every 6 (six) hours as needed for headache (pain).     losartan 100 MG tablet  Commonly known as:  COZAAR  Take 100 mg by mouth Daily.     metoprolol succinate 50 MG 24 hr tablet  Commonly known as:  TOPROL-XL  Take 50 mg by mouth Daily.     Oxycodone HCl 10 MG Tabs  Take 10 mg by mouth daily as needed (pain).     predniSONE 5 MG tablet  Commonly known as:  DELTASONE  Take 5 mg by mouth daily with breakfast.     RABEprazole 20 MG tablet  Commonly known as:  ACIPHEX  Take 20 mg by mouth daily.       Follow-up Information   Follow up with Garlan Fillers, MD. Schedule an appointment as soon as possible for a visit in 4 days.   Specialty:  Internal Medicine   Contact information:   [address] [place] [address] [phone]        Signed: Jarome Matin 02/15/2014, 9:13 AM

## 2014-02-17 ENCOUNTER — Emergency Department (HOSPITAL_COMMUNITY)
Admission: EM | Admit: 2014-02-17 | Discharge: 2014-02-17 | Disposition: A | Discharge status: Home / Self Care | Payer: 59 | Attending: Emergency Medicine | Admitting: Emergency Medicine

## 2014-02-17 ENCOUNTER — Encounter (HOSPITAL_COMMUNITY): Payer: Self-pay | Admitting: Emergency Medicine

## 2014-02-17 DIAGNOSIS — J45909 Unspecified asthma, uncomplicated: Secondary | ICD-10-CM | POA: Insufficient documentation

## 2014-02-17 DIAGNOSIS — I10 Essential (primary) hypertension: Secondary | ICD-10-CM | POA: Diagnosis not present

## 2014-02-17 DIAGNOSIS — Z880 Allergy status to penicillin: Secondary | ICD-10-CM | POA: Insufficient documentation

## 2014-02-17 DIAGNOSIS — K219 Gastro-esophageal reflux disease without esophagitis: Secondary | ICD-10-CM | POA: Diagnosis not present

## 2014-02-17 DIAGNOSIS — Z8669 Personal history of other diseases of the nervous system and sense organs: Secondary | ICD-10-CM | POA: Diagnosis not present

## 2014-02-17 DIAGNOSIS — Z87891 Personal history of nicotine dependence: Secondary | ICD-10-CM | POA: Diagnosis not present

## 2014-02-17 DIAGNOSIS — Z792 Long term (current) use of antibiotics: Secondary | ICD-10-CM | POA: Diagnosis not present

## 2014-02-17 DIAGNOSIS — L03119 Cellulitis of unspecified part of limb: Principal | ICD-10-CM

## 2014-02-17 DIAGNOSIS — L03115 Cellulitis of right lower limb: Secondary | ICD-10-CM

## 2014-02-17 DIAGNOSIS — L02419 Cutaneous abscess of limb, unspecified: Secondary | ICD-10-CM | POA: Diagnosis not present

## 2014-02-17 DIAGNOSIS — Z79899 Other long term (current) drug therapy: Secondary | ICD-10-CM | POA: Diagnosis not present

## 2014-02-17 DIAGNOSIS — M069 Rheumatoid arthritis, unspecified: Secondary | ICD-10-CM | POA: Insufficient documentation

## 2014-02-17 DIAGNOSIS — IMO0002 Reserved for concepts with insufficient information to code with codable children: Secondary | ICD-10-CM | POA: Insufficient documentation

## 2014-02-17 MED ORDER — VANCOMYCIN HCL 10 G IV SOLR
2000.0000 mg | Freq: Once | INTRAVENOUS | Status: AC
  Administered 2014-02-17: 2000 mg via INTRAVENOUS
  Filled 2014-02-17 (×2): qty 2000

## 2014-02-17 NOTE — ED Provider Notes (Signed)
CSN: [id number]     Arrival date & time 02/17/14  1827 History   First MD Initiated Contact with Patient 02/17/14 1837     Chief Complaint  Patient presents with  . here for antibiotics       HPI Patient reports recent discharge in the hospital for cellulitis of his right lower extremity which is doing much better.  He is scheduled to get IV vancomycin at home for her right upper extremity PICC line.  Home health was not able to be set up tonight and he is scheduled to have his vancomycin tonight.  He requests IV vancomycin now.  He otherwise has no complaints.   Past Medical History  Diagnosis Date  . Asthma   . GERD (gastroesophageal reflux disease)   . Hypertension   . OSA (obstructive sleep apnea)     "quit wearing mask several years ago" (02/11/2014)  . Rheumatoid arthritis dx'd ~ 1977  . Cellulitis of lower extremity     "usually RLL; this time it's got up to my lower abdoment" (02/11/2014)   Past Surgical History  Procedure Laterality Date  . 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 Excisional hemorrhoidectomy    . Revision total knee arthroplasty Right 2012   Family History  Problem Relation Age of Onset  . Colon cancer Mother    History  Substance Use Topics  . Smoking status: Former Smoker -- 1.00 packs/day for 3 years    Types: Cigarettes  . Smokeless tobacco: Current User    Types: Snuff     Comment: "quit smoking cigarettes in the 1980's"  . Alcohol Use: No    Review of Systems  All other systems reviewed and are negative.     Allergies  Avelox; Sulfa antibiotics; Erythrocin; and Penicillins  Home Medications   Prior to Admission medications   Medication Sig Start Date End Date Taking? Authorizing Provider  acetaminophen (TYLENOL) 325 MG tablet Take 2 tablets (650 mg total) by mouth every 6 (six) hours as needed for  mild pain (or Fever >/= 101). 02/15/14  Yes Leanna Battles, MD  ADVAIR DISKUS 250-50 MCG/DOSE AEPB Inhale 1 puff into the lungs daily as needed (shortness of breath).  12/05/10  Yes Historical Provider, MD  clindamycin (CLEOCIN) 300 MG capsule Take 2 capsules (600 mg total) by mouth 3 (three) times daily. 02/15/14  Yes Leanna Battles, MD  clotrimazole (LOTRIMIN) 1 % cream Apply 1 application topically daily as needed (athletes foot).   Yes Historical Provider, MD  diltiazem (CARDIZEM CD) 240 MG 24 hr capsule Take 240 mg by mouth every morning.  11/12/10  Yes Historical Provider, MD  furosemide (LASIX) 40 MG tablet Take 40 mg by mouth every morning.   Yes Historical Provider, MD  hydroxychloroquine (PLAQUENIL) 200 MG tablet Take 200 mg by mouth 2 (two) times daily. arthritis 01/22/11  Yes Historical Provider, MD  ibuprofen (ADVIL,MOTRIN) 200 MG tablet Take 400-600 mg by mouth every 6 (six) hours as needed for headache or moderate pain (pain).    Yes Historical Provider, MD  losartan (COZAAR) 100 MG tablet Take 100 mg by mouth every morning.  11/12/10  Yes Historical Provider, MD  metoprolol (TOPROL-XL) 50 MG 24 hr tablet Take 50 mg by mouth every morning.  11/12/10  Yes Historical Provider, MD  oxyCODONE (OXY IR/ROXICODONE) 5 MG immediate release tablet Take  10 mg by mouth every 6 (six) hours as needed for severe pain.   Yes Historical Provider, MD  predniSONE (DELTASONE) 5 MG tablet Take 5 mg by mouth daily with breakfast.  01/22/14  Yes Historical Provider, MD  RABEprazole (ACIPHEX) 20 MG tablet Take 20 mg by mouth daily.   Yes Historical Provider, MD   BP 136/78  Pulse 81  Temp(Src) 98.9 F (37.2 C) (Oral)  Resp 18  SpO2 96% Physical Exam  Nursing note and vitals reviewed. Constitutional: He is oriented to person, place, and time. He appears well-developed and well-nourished.  HENT:  Head: Normocephalic and atraumatic.  Eyes: EOM are normal.  Neck: Normal range of motion.  Cardiovascular: Normal  rate, regular rhythm, normal heart sounds and intact distal pulses.   Pulmonary/Chest: Effort normal and breath sounds normal. No respiratory distress.  Abdominal: Soft. He exhibits no distension. There is no tenderness.  Musculoskeletal: Normal range of motion.  Right lower extremity cellulitis the level of the mid right tibia.  Neurological: He is alert and oriented to person, place, and time.  Skin: Skin is warm and dry.  Psychiatric: He has a normal mood and affect. Judgment normal.    ED Course  Procedures (including critical care time) Labs Review Labs Reviewed - No data to display  Imaging Review No results found.   EKG Interpretation None      MDM   Final diagnoses:  Cellulitis of right lower extremity    IV vancomycin given.  Patient continues to feel well.  Vital signs normal.  Discharge home.  He is already set up with home health to get ongoing IV vancomycin.    Hoy Morn, MD 02/17/14 [phone]

## 2014-02-17 NOTE — ED Notes (Signed)
Pt care assumed, obtained verbal report.  Pt sitting on the side of the stretcher, family at bedside.

## 2014-02-17 NOTE — ED Notes (Signed)
Per patient, here for IV Vanc for right foot cellulitis-HHC agency unable to provide him with meds tonight

## 2014-02-17 NOTE — ED Notes (Addendum)
Pt is resting comfortably with family at bedside.   Denies any complaints at this time.

## 2014-02-17 NOTE — ED Notes (Addendum)
Pt and family states that family can just flush the PICC line with heparin when they get home.  They did not want to wait for the heparin flush

## 2014-02-18 ENCOUNTER — Encounter (HOSPITAL_COMMUNITY): Payer: Self-pay

## 2014-02-18 ENCOUNTER — Ambulatory Visit (HOSPITAL_COMMUNITY)
Admission: RE | Admit: 2014-02-18 | Discharge: 2014-02-18 | Disposition: A | Discharge status: Home / Self Care | Payer: No Typology Code available for payment source | Source: Ambulatory Visit | Attending: Internal Medicine | Admitting: Internal Medicine

## 2014-02-18 DIAGNOSIS — L02219 Cutaneous abscess of trunk, unspecified: Secondary | ICD-10-CM | POA: Insufficient documentation

## 2014-02-18 DIAGNOSIS — L03319 Cellulitis of trunk, unspecified: Principal | ICD-10-CM

## 2014-02-18 MED ORDER — HEPARIN SOD (PORK) LOCK FLUSH 100 UNIT/ML IV SOLN
250.0000 [IU] | Freq: Once | INTRAVENOUS | Status: AC
  Administered 2014-02-18: 250 [IU] via INTRAVENOUS
  Filled 2014-02-18: qty 5

## 2014-02-18 MED ORDER — VANCOMYCIN HCL 10 G IV SOLR
2000.0000 mg | Freq: Once | INTRAVENOUS | Status: AC
  Administered 2014-02-18: 2000 mg via INTRAVENOUS
  Filled 2014-02-18: qty 2000

## 2014-02-18 MED ORDER — SODIUM CHLORIDE 0.9 % IV SOLN
Freq: Once | INTRAVENOUS | Status: AC
  Administered 2014-02-18: 16:00:00 via INTRAVENOUS

## 2014-02-18 MED ORDER — SODIUM CHLORIDE 0.9 % IJ SOLN
10.0000 mL | Freq: Once | INTRAMUSCULAR | Status: AC
  Administered 2014-02-18: 10 mL

## 2014-02-18 MED ORDER — HEPARIN SOD (PORK) LOCK FLUSH 100 UNIT/ML IV SOLN: 500.0000 [IU] | Freq: Once | INTRAVENOUS | Status: DC

## 2014-02-18 NOTE — Discharge Instructions (Signed)
Cellulitis Cellulitis is an infection of the skin and the tissue under the skin. The infected area is usually red and tender. This happens most often in the arms and lower legs. HOME CARE   Take your antibiotic medicine as told. Finish the medicine even if you start to feel better.  Keep the infected arm or leg raised (elevated).  Put a warm cloth on the area up to 4 times per day.  Only take medicines as told by your doctor.  Keep all doctor visits as told. GET HELP RIGHT AWAY IF:   You have a fever.  You feel very sleepy.  You throw up (vomit) or have watery poop (diarrhea).  You feel sick and have muscle aches and pains.  You see red streaks on the skin coming from the infected area.  Your red area gets bigger or turns a dark color.  Your bone or joint under the infected area is painful after the skin heals.  Your infection comes back in the same area or different area.  You have a puffy (swollen) bump in the infected area.  You have new symptoms. MAKE SURE YOU:   Understand these instructions.  Will watch your condition.  Will get help right away if you are not doing well or get worse. Document Released: 03/19/2008 Document Revised: 04/01/2012 Document Reviewed: 12/17/2011 [facility] Patient Information 2014 [place], [place]. Vancomycin injection What is this medicine? VANCOMYCIN Zenaida Niece koe MYE sin) is a glycopeptide antibiotic. It is used to treat certain kinds of bacterial infections. It will not work for colds, flu, or other viral infections. This medicine may be used for other purposes; ask your health care provider or pharmacist if you have questions. COMMON BRAND NAME(S): Vancocin What should I tell my health care provider before I take this medicine? They need to know if you have any of these conditions: -dehydration -hearing loss -kidney disease -other chronic illness -an unusual or allergic reaction to vancomycin, other medicines, foods, dyes, or  preservatives -pregnant or trying to get pregnant -breast-feeding How should I use this medicine? This medicine is infused into a vein. It is usually given by a health care provider in a hospital or clinic. If you receive this medicine at home, you will receive special instructions. Take your medicine at regular intervals. Do not take your medicine more often than directed. Take all of your medicine as directed even if you think you are better. Do not skip doses or stop your medicine early. It is important that you put your used needles and syringes in a special sharps container. Do not put them in a trash can. If you do not have a sharps container, call your pharmacist or healthcare provider to get one. Talk to your pediatrician regarding the use of this medicine in children. While this drug may be prescribed for even very young infants for selected conditions, precautions do apply. Overdosage: If you think you have taken too much of this medicine contact a poison control center or emergency room at once. NOTE: This medicine is only for you. Do not share this medicine with others. What if I miss a dose? If you miss a dose, take it as soon as you can. If it is almost time for your next dose, take only that dose. Do not take double or extra doses. What may interact with this medicine? -amphotericin B -anesthetics -bacitracin -cisplatin -colistin -diuretics -other aminoglycoside antibiotics -polymyxin B This list may not describe all possible interactions. Give  your health care provider a list of all the medicines, herbs, non-prescription drugs, or dietary supplements you use. Also tell them if you smoke, drink alcohol, or use illegal drugs. Some items may interact with your medicine. What should I watch for while using this medicine? Tell your doctor or health care professional if your symptoms do not improve or if you get new symptoms. Your condition and lab work will be monitored while you  are taking this medicine. Do not treat diarrhea with over the counter products. Contact your doctor if you have diarrhea that lasts more than 2 days or if it is severe and watery. What side effects may I notice from receiving this medicine? Side effects that you should report to your doctor or health care professional as soon as possible: -allergic reactions like skin rash, itching or hives, swelling of the face, lips, or tongue -breathing difficulty, wheezing -change in amount, color of urine -change in hearing -chest pain -dizziness -fever, chills -flushing of the face and neck (reddening) -low blood pressure -redness, blistering, peeling or loosening of the skin, including inside the mouth -unusual bleeding or bruising -unusually weak or tired Side effects that usually do not require medical attention (report to your doctor or health care professional if they continue or are bothersome): -nausea, vomiting -pain, swelling where injected -stomach cramps This list may not describe all possible side effects. Call your doctor for medical advice about side effects. You may report side effects to FDA at 1-800-FDA-1088. Where should I keep my medicine? Keep out of the reach of children. You will be instructed on how to store this medicine, if needed. Throw away any unused medicine after the expiration date on the label. NOTE: This sheet is a summary. It may not cover all possible information. If you have questions about this medicine, talk to your doctor, pharmacist, or health care provider.  2014, [employer]vier/Gold Standard. (2008-07-06 14:34:06)

## 2014-03-03 NOTE — Progress Notes (Signed)
Avel Peace, PA  - Please enter preop orders in Epic for [name] - surgery date 6/4.  Thanks.

## 2014-03-08 ENCOUNTER — Other Ambulatory Visit: Payer: Self-pay | Admitting: Orthopedic Surgery

## 2014-03-11 ENCOUNTER — Encounter (HOSPITAL_COMMUNITY): Payer: Self-pay | Admitting: Pharmacy Technician

## 2014-03-14 ENCOUNTER — Other Ambulatory Visit: Payer: Self-pay | Admitting: Orthopedic Surgery

## 2014-03-14 NOTE — H&P (Signed)
[name] DOB: 07/17/1961 Married / Language: English / Race: White Male  Date of Admission:  03-18-2014  Chief Complaint:  Infected Right Total Knee  History of Present Illness The patient is a 53 year old male who comes in for a preoperative History and Physical. The patient is scheduled for a right resecrion of right total knee to be performed by Dr. Frank V. Aluisio, MD at [facility] on 03-18-2014. The patient is a 53 year old male presenting for follow up and H&P. The patient comes in 3 years out from right total knee arthroplasty (revision). The patient states that he is not doing well at this time. The pain is under fair control at this time and describe their pain as mild to moderate. They are currently on no medication for their pain. He was hospitalized April 30th and discharged May 4th. He was put in the hospital for cellulitis. He has been on IV Vancomycin and Clindamycin since he was discharged. We review his bone scan and the lab resuts. 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 He is going to require resection arthroplasty. Unfortunately he is having a lot of difficulty with the right leg. The knee itself is really not hurting. He has had multiple bouts of cellulitis with a lot of swelling in the leg. He has not had any issues specifically with the knee other than some swelling. They have failed non-operative management including IV antibiotics for the cellulitis. It is felt that they would benefit from undergoing resection of the joint replacement. Risks and benefits of the procedure have been discussed with the patient and they elect to proceed with surgery. There are no active contraindications to surgery such as rapidly progressive neurological disease.  Allergies Penicillin VK *PENICILLINS*. Rash, Itching. Avelox ABC Pack *FLUOROQUINOLONES*. Facial Flushing, Coughing Sulfanilamide *CHEMICALS*. Rash. Erythromycin *MACROLIDES*. Itching,  Rash.   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 father Cancer. Mother. mother Hypertension. mother and father Kidney disease. father    Social History Current work status. working full time Illicit drug use. no Alcohol use. never consumed alcohol no Children. 1 Tobacco use. Former smoker. never smoker Living situation. live with spouse Pain Contract. no Tobacco / smoke exposure. no Exercise. Exercises rarely Drug/Alcohol Rehab (Currently). no Drug/Alcohol Rehab (Previously). no Marital status. married Number of flights of stairs before winded. 1    Medication History Clindamycin HCl ( Oral) Specific dose unknown - Active. Vancomycin HCl ( Intravenous) Specific dose unknown - Active. Hydroxychloroquine Sulfate (200MG Tablet, Oral) Active. PredniSONE (5MG Tablet, Oral) Active. Furosemide (40MG Tablet, Oral) Active. Metoprolol Succinate ER (50MG Tablet ER 24HR, Oral) Active. Diltiazem HCl ER Coated Beads (240MG Capsule ER 24HR, Oral) Active. Aciphex (20MG Tablet DR, Oral) Active. Losartan Potassium (100MG Tablet, Oral) Active.    Past Surgical History Total Hip Replacement. bilateral Hip Fracture and Surgery. bilateral Total Knee Replacement. bilateral   Medical History High blood pressure Gastroesophageal Reflux Disease Sleep Apnea. no cpap Rheumatoid Arthritis Asthma Cellulitis. typically lower extremities    Vitals Weight: 290 lb Height: 68 in Weight was reported by patient. Height was reported by patient. Body Surface Area: 2.51 m Body Mass Index: 44.09 kg/m Pulse: 78 (Regular) Resp.: 16 (Unlabored) BP: 138/84 (Sitting, Left Arm, Standard)   Physical Exam The physical exam findings are as follows:   General Mental Status -   Alert,  cooperative and good historian. General Appearance- pleasant. Not in acute distress. Orientation- Oriented X3. Build & Nutrition- Well nourished and Well developed.   Head and Neck Head- normocephalic, atraumatic . Neck Global Assessment- supple. no bruit auscultated on the right and no bruit auscultated on the left.   Eye Vision- Wears corrective lenses. Pupil- Bilateral- Regular and Round. Motion- Bilateral- EOMI.   Chest and Lung Exam Auscultation: Breath sounds:- clear at anterior chest wall and - clear at posterior chest wall. Adventitious sounds:- No Adventitious sounds.   Cardiovascular Auscultation:Rhythm- Regular rate and rhythm. Heart Sounds- S1 WNL and S2 WNL. Murmurs & Other Heart Sounds:Auscultation of the heart reveals - No Murmurs.   Abdomen Inspection:Contour- Generalized moderate distention. Palpation/Percussion:Tenderness- Abdomen is non-tender to palpation. Rigidity (guarding)- Abdomen is soft. Auscultation:Auscultation of the abdomen reveals - Bowel sounds normal.   Male Genitourinary  Not done, not pertinent to present illness  Musculoskeletal On exam he is in no distress. His right lower leg is very swollen. His right knee also has an effusion. There is no significant warmth about the knee. No erythema about the knee.  Bone Scan - showed increased uptake in all three phases indicative of aseptic loosening or infection.   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 Lequita Halt.  Plan is to go home.  PCP - Dr. Jarome Matin - Patient has been seen preoperatively and felt to be stable for surgery.  The patient does not have any contraindications and will receive TXA (tranexamic acid) prior to surgery.  Signed electronically by Lauraine Rinne, III PA-C

## 2014-03-16 ENCOUNTER — Ambulatory Visit (HOSPITAL_COMMUNITY)
Admission: RE | Admit: 2014-03-16 | Discharge: 2014-03-16 | Disposition: A | Discharge status: Home / Self Care | Payer: 59 | Source: Ambulatory Visit | Attending: Orthopedic Surgery | Admitting: Orthopedic Surgery

## 2014-03-16 ENCOUNTER — Encounter (HOSPITAL_COMMUNITY): Payer: Self-pay

## 2014-03-16 ENCOUNTER — Encounter (HOSPITAL_COMMUNITY)
Admission: RE | Admit: 2014-03-16 | Discharge: 2014-03-16 | Disposition: A | Discharge status: Home / Self Care | Payer: 59 | Source: Ambulatory Visit | Attending: Orthopedic Surgery | Admitting: Orthopedic Surgery

## 2014-03-16 DIAGNOSIS — Z95828 Presence of other vascular implants and grafts: Secondary | ICD-10-CM

## 2014-03-16 DIAGNOSIS — Z01818 Encounter for other preprocedural examination: Secondary | ICD-10-CM | POA: Insufficient documentation

## 2014-03-16 DIAGNOSIS — I10 Essential (primary) hypertension: Secondary | ICD-10-CM | POA: Insufficient documentation

## 2014-03-16 DIAGNOSIS — Z01812 Encounter for preprocedural laboratory examination: Secondary | ICD-10-CM | POA: Insufficient documentation

## 2014-03-16 DIAGNOSIS — Z01810 Encounter for preprocedural cardiovascular examination: Secondary | ICD-10-CM | POA: Insufficient documentation

## 2014-03-16 HISTORY — DX: Presence of other vascular implants and grafts: Z95.828

## 2014-03-16 LAB — CBC
HEMATOCRIT: 36.4 % — AB (ref 39.0–52.0)
HEMOGLOBIN: 11.5 g/dL — AB (ref 13.0–17.0)
MCH: 25.9 pg — ABNORMAL LOW (ref 26.0–34.0)
MCHC: 31.6 g/dL (ref 30.0–36.0)
MCV: 82 fL (ref 78.0–100.0)
Platelets: 271 10*3/uL (ref 150–400)
RBC: 4.44 MIL/uL (ref 4.22–5.81)
RDW: 16.6 % — ABNORMAL HIGH (ref 11.5–15.5)
WBC: 8.5 10*3/uL (ref 4.0–10.5)

## 2014-03-16 LAB — URINALYSIS, ROUTINE W REFLEX MICROSCOPIC
BILIRUBIN URINE: NEGATIVE
GLUCOSE, UA: NEGATIVE mg/dL
Hgb urine dipstick: NEGATIVE
Ketones, ur: NEGATIVE mg/dL
Leukocytes, UA: NEGATIVE
Nitrite: NEGATIVE
PROTEIN: NEGATIVE mg/dL
Specific Gravity, Urine: 1.01 (ref 1.005–1.030)
UROBILINOGEN UA: 0.2 mg/dL (ref 0.0–1.0)
pH: 6.5 (ref 5.0–8.0)

## 2014-03-16 LAB — COMPREHENSIVE METABOLIC PANEL
ALK PHOS: 63 U/L (ref 39–117)
ALT: 11 U/L (ref 0–53)
AST: 15 U/L (ref 0–37)
Albumin: 3.2 g/dL — ABNORMAL LOW (ref 3.5–5.2)
BILIRUBIN TOTAL: 0.3 mg/dL (ref 0.3–1.2)
BUN: 15 mg/dL (ref 6–23)
CO2: 26 meq/L (ref 19–32)
Calcium: 9.2 mg/dL (ref 8.4–10.5)
Chloride: 103 mEq/L (ref 96–112)
Creatinine, Ser: 1 mg/dL (ref 0.50–1.35)
GFR, EST NON AFRICAN AMERICAN: 84 mL/min — AB (ref >90)
GLUCOSE: 96 mg/dL (ref 70–99)
Potassium: 4.4 mEq/L (ref 3.7–5.3)
SODIUM: 139 meq/L (ref 137–147)
Total Protein: 8.3 g/dL (ref 6.0–8.3)

## 2014-03-16 LAB — PROTIME-INR
INR: 1.14 (ref 0.00–1.49)
PROTHROMBIN TIME: 14.4 s (ref 11.6–15.2)

## 2014-03-16 LAB — SURGICAL PCR SCREEN
MRSA, PCR: NEGATIVE
Staphylococcus aureus: POSITIVE — AB

## 2014-03-16 LAB — APTT: APTT: 32 s (ref 24–37)

## 2014-03-16 NOTE — Pre-Procedure Instructions (Addendum)
03-16-14 EKG/ CXR done today. 03-16-14 Clearance note with chart-Dr. Eloise Harman 03-03-14.

## 2014-03-16 NOTE — Patient Instructions (Addendum)
20 [name]  03/16/2014   Your procedure is scheduled on:   03-18-2014  Enter through [facility] Entrance and follow signs to Short Stay Center. Arrive at     1200 noon..  Call this number if you have problems the morning of surgery: [phone]  Or Presurgical Testing [phone](Toran Murch) For Living Will and/or Health Care Power Attorney Forms: please provide copy for your medical record,may bring AM of surgery(Forms should be already notarized -we do not provide this service).(03-16-14  No information preferred today).   For Cpap use: Bring mask and tubing only.-(not using)   Do not eat food:After Midnight.  May have clear liquids:up to 6 Hours before arrival. Nothing after : 0900 AM  Clear liquids include soda, tea, black coffee, apple or grape juice, broth.  Take these medicines the morning of surgery with A SIP OF WATER: Diltiazem. Metoprolol. Prednisone. Aciphex. Use/bring Advair-as needed.   Do not wear jewelry, make-up or nail polish.  Do not wear lotions, powders, or perfumes. You may wear deodorant.  Do not shave 48 hours(2 days) prior to first CHG shower(legs and under arms).(Shaving face and neck okay.)  Do not bring valuables to the hospital.(Hospital is not responsible for lost valuables).  Contacts, dentures or removable bridgework, body piercing, hair pins may not be worn into surgery.  Leave suitcase in the car. After surgery it may be brought to your room.  For patients admitted to the hospital, checkout time is 11:00 AM the day of discharge.(Restricted visitors-Any Persons displaying flu-like symptoms or illness).    Patients discharged the day of surgery will not be allowed to drive home. Must have responsible person with you x 24 hours once discharged.  Name and phone number of your driver: Sheran Spine [phone] cell  Special Instructions: CHG(Chlorhedine 4%-"Hibiclens","Betasept","Aplicare") Shower Use Special Wash: see special instructions.(avoid face  and genitals)   Please read over the following fact sheets that you were given: MRSA Information, Blood Transfusion fact sheet, Incentive Spirometry Instruction.  Remember : Type/Screen "Blue armbands" - may not be removed once applied(would result in being retested AM of surgery, if removed).  Failure to follow these instructions may result in Cancellation of your surgery.  _________________________    [facility] - Preparing for Surgery Before surgery, you can play an important role.  Because skin is not sterile, your skin needs to be as free of germs as possible.  You can reduce the number of germs on your skin by washing with CHG (chlorahexidine gluconate) soap before surgery.  CHG is an antiseptic cleaner which kills germs and bonds with the skin to continue killing germs even after washing. Please DO NOT use if you have an allergy to CHG or antibacterial soaps.  If your skin becomes reddened/irritated stop using the CHG and inform your nurse when you arrive at Short Stay. Do not shave (including legs and underarms) for at least 48 hours prior to the first CHG shower.  You may shave your face/neck. Please follow these instructions carefully:  1.  Shower with CHG Soap the night before surgery and the  morning of Surgery.  2.  If you choose to wash your hair, wash your hair first as usual with your  normal  shampoo.  3.  After you shampoo, rinse your hair and body thoroughly to remove the  shampoo.  4.  Use CHG as you would any other liquid soap.  You can apply chg directly  to the skin and wash                       Gently with a scrungie or clean washcloth.  5.  Apply the CHG Soap to your body ONLY FROM THE NECK DOWN.   Do not use on face/ open                           Wound or open sores. Avoid contact with eyes, ears mouth and genitals (private parts).                       Wash face,  Genitals (private parts) with your normal soap.             6.  Wash  thoroughly, paying special attention to the area where your surgery  will be performed.  7.  Thoroughly rinse your body with warm water from the neck down.  8.  DO NOT shower/wash with your normal soap after using and rinsing off  the CHG Soap.                9.  Pat yourself dry with a clean towel.            10.  Wear clean pajamas.            11.  Place clean sheets on your bed the night of your first shower and do not  sleep with pets. Day of Surgery : Do not apply any lotions/deodorants the morning of surgery.  Please wear clean clothes to the hospital/surgery center.  FAILURE TO FOLLOW THESE INSTRUCTIONS MAY RESULT IN THE CANCELLATION OF YOUR SURGERY PATIENT SIGNATURE_________________________________  NURSE SIGNATURE__________________________________  ________________________________________________________________________   [name]  An incentive spirometer is a tool that can help keep your lungs clear and active. This tool measures how well you are filling your lungs with each breath. Taking long deep breaths may help reverse or decrease the chance of developing breathing (pulmonary) problems (especially infection) following:  A long period of time when you are unable to move or be active. BEFORE THE PROCEDURE   If the spirometer includes an indicator to show your best effort, your nurse or respiratory therapist will set it to a desired goal.  If possible, sit up straight or lean slightly forward. Try not to slouch.  Hold the incentive spirometer in an upright position. INSTRUCTIONS FOR USE  1. Sit on the edge of your bed if possible, or sit up as far as you can in bed or on a chair. 2. Hold the incentive spirometer in an upright position. 3. Breathe out normally. 4. Place the mouthpiece in your mouth and seal your lips tightly around it. 5. Breathe in slowly and as deeply as possible, raising the piston or the ball toward the top of the column. 6. Hold your  breath for 3-5 seconds or for as long as possible. Allow the piston or ball to fall to the bottom of the column. 7. Remove the mouthpiece from your mouth and breathe out normally. 8. Rest for a few seconds and repeat Steps 1 through 7 at least 10 times every 1-2 hours when you are awake. Take your time and take a few normal breaths between deep breaths. 9. The spirometer may include an indicator to  show your best effort. Use the indicator as a goal to work toward during each repetition. 10. After each set of 10 deep breaths, practice coughing to be sure your lungs are clear. If you have an incision (the cut made at the time of surgery), support your incision when coughing by placing a pillow or rolled up towels firmly against it. Once you are able to get out of bed, walk around indoors and cough well. You may stop using the incentive spirometer when instructed by your caregiver.  RISKS AND COMPLICATIONS  Take your time so you do not get dizzy or light-headed.  If you are in pain, you may need to take or ask for pain medication before doing incentive spirometry. It is harder to take a deep breath if you are having pain. AFTER USE  Rest and breathe slowly and easily.  It can be helpful to keep track of a log of your progress. Your caregiver can provide you with a simple table to help with this. If you are using the spirometer at home, follow these instructions: SEEK MEDICAL CARE IF:   You are having difficultly using the spirometer.  You have trouble using the spirometer as often as instructed.  Your pain medication is not giving enough relief while using the spirometer.  You develop fever of 100.5 F (38.1 C) or higher. SEEK IMMEDIATE MEDICAL CARE IF:   You cough up bloody sputum that had not been present before.  You develop fever of 102 F (38.9 C) or greater.  You develop worsening pain at or near the incision site. MAKE SURE YOU:   Understand these instructions.  Will watch  your condition.  Will get help right away if you are not doing well or get worse. Document Released: 02/11/2007 Document Revised: 12/24/2011 Document Reviewed: 04/14/2007 Exit[employer] Patient Information 2014 ExitCare, [place].   ________________________________________________________________________  WHAT IS A BLOOD TRANSFUSION? Blood Transfusion Information  A transfusion is the replacement of blood or some of its parts. Blood is made up of multiple cells which provide different functions.  Red blood cells carry oxygen and are used for blood loss replacement.  White blood cells fight against infection.  Platelets control bleeding.  Plasma helps clot blood.  Other blood products are available for specialized needs, such as hemophilia or other clotting disorders. BEFORE THE TRANSFUSION  Who gives blood for transfusions?   Healthy volunteers who are fully evaluated to make sure their blood is safe. This is blood bank blood. Transfusion therapy is the safest it has ever been in the practice of medicine. Before blood is taken from a donor, a complete history is taken to make sure that person has no history of diseases nor engages in risky social behavior (examples are intravenous drug use or sexual activity with multiple partners). The donor's travel history is screened to minimize risk of transmitting infections, such as malaria. The donated blood is tested for signs of infectious diseases, such as HIV and hepatitis. The blood is then tested to be sure it is compatible with you in order to minimize the chance of a transfusion reaction. If you or a relative donates blood, this is often done in anticipation of surgery and is not appropriate for emergency situations. It takes many days to process the donated blood. RISKS AND COMPLICATIONS Although transfusion therapy is very safe and saves many lives, the main dangers of transfusion include:   Getting an infectious disease.  Developing a  transfusion reaction. This is an allergic reaction  to something in the blood you were given. Every precaution is taken to prevent this. The decision to have a blood transfusion has been considered carefully by your caregiver before blood is given. Blood is not given unless the benefits outweigh the risks. AFTER THE TRANSFUSION  Right after receiving a blood transfusion, you will usually feel much better and more energetic. This is especially true if your red blood cells have gotten low (anemic). The transfusion raises the level of the red blood cells which carry oxygen, and this usually causes an energy increase.  The nurse administering the transfusion will monitor you carefully for complications. HOME CARE INSTRUCTIONS  No special instructions are needed after a transfusion. You may find your energy is better. Speak with your caregiver about any limitations on activity for underlying diseases you may have. SEEK MEDICAL CARE IF:   Your condition is not improving after your transfusion.  You develop redness or irritation at the intravenous (IV) site. SEEK IMMEDIATE MEDICAL CARE IF:  Any of the following symptoms occur over the next 12 hours:  Shaking chills.  You have a temperature by mouth above 102 F (38.9 C), not controlled by medicine.  Chest, back, or muscle pain.  People around you feel you are not acting correctly or are confused.  Shortness of breath or difficulty breathing.  Dizziness and fainting.  You get a rash or develop hives.  You have a decrease in urine output.  Your urine turns a dark color or changes to pink, red, or brown. Any of the following symptoms occur over the next 10 days:  You have a temperature by mouth above 102 F (38.9 C), not controlled by medicine.  Shortness of breath.  Weakness after normal activity.  The white part of the eye turns yellow (jaundice).  You have a decrease in the amount of urine or are urinating less often.  Your  urine turns a dark color or changes to pink, red, or brown. Document Released: 09/28/2000 Document Revised: 12/24/2011 Document Reviewed: 05/17/2008 [facility] Patient Information 2014 [place], [place].  _______________________________________________________________________

## 2014-03-17 NOTE — Pre-Procedure Instructions (Addendum)
03-17-14 0950 PCR screen Positive for Staph aureus -Rx.Mupirocin called to [facility]-pt aware to use as directed. 03-17-14 1620 -Dr. Rica Mast noted EKG-abnormal . Read by Cardiologist. Note to Dr. Lequita Halt office.

## 2014-03-18 ENCOUNTER — Encounter (HOSPITAL_COMMUNITY): Payer: 59 | Admitting: Anesthesiology

## 2014-03-18 ENCOUNTER — Encounter (HOSPITAL_COMMUNITY)
Admission: RE | Disposition: A | Discharge status: Home Health | Payer: Self-pay | Source: Ambulatory Visit | Attending: Orthopedic Surgery

## 2014-03-18 ENCOUNTER — Encounter (HOSPITAL_COMMUNITY): Payer: Self-pay | Admitting: *Deleted

## 2014-03-18 ENCOUNTER — Inpatient Hospital Stay (HOSPITAL_COMMUNITY)
Admission: RE | Admit: 2014-03-18 | Discharge: 2014-03-21 | DRG: 464 | Disposition: A | Discharge status: Home Health | Payer: 59 | Source: Ambulatory Visit | Attending: Orthopedic Surgery | Admitting: Orthopedic Surgery

## 2014-03-18 ENCOUNTER — Inpatient Hospital Stay (HOSPITAL_COMMUNITY): Payer: 59 | Admitting: Anesthesiology

## 2014-03-18 DIAGNOSIS — L0390 Cellulitis, unspecified: Secondary | ICD-10-CM

## 2014-03-18 DIAGNOSIS — Z8249 Family history of ischemic heart disease and other diseases of the circulatory system: Secondary | ICD-10-CM

## 2014-03-18 DIAGNOSIS — G4733 Obstructive sleep apnea (adult) (pediatric): Secondary | ICD-10-CM | POA: Diagnosis present

## 2014-03-18 DIAGNOSIS — T8450XA Infection and inflammatory reaction due to unspecified internal joint prosthesis, initial encounter: Principal | ICD-10-CM | POA: Diagnosis present

## 2014-03-18 DIAGNOSIS — K219 Gastro-esophageal reflux disease without esophagitis: Secondary | ICD-10-CM | POA: Diagnosis present

## 2014-03-18 DIAGNOSIS — Z880 Allergy status to penicillin: Secondary | ICD-10-CM

## 2014-03-18 DIAGNOSIS — Z87891 Personal history of nicotine dependence: Secondary | ICD-10-CM

## 2014-03-18 DIAGNOSIS — T8453XA Infection and inflammatory reaction due to internal right knee prosthesis, initial encounter: Secondary | ICD-10-CM

## 2014-03-18 DIAGNOSIS — Z96649 Presence of unspecified artificial hip joint: Secondary | ICD-10-CM

## 2014-03-18 DIAGNOSIS — I10 Essential (primary) hypertension: Secondary | ICD-10-CM | POA: Diagnosis present

## 2014-03-18 DIAGNOSIS — Z113 Encounter for screening for infections with a predominantly sexual mode of transmission: Secondary | ICD-10-CM

## 2014-03-18 DIAGNOSIS — Z6841 Body Mass Index (BMI) 40.0 and over, adult: Secondary | ICD-10-CM

## 2014-03-18 DIAGNOSIS — Z96659 Presence of unspecified artificial knee joint: Secondary | ICD-10-CM

## 2014-03-18 DIAGNOSIS — J45909 Unspecified asthma, uncomplicated: Secondary | ICD-10-CM | POA: Diagnosis present

## 2014-03-18 DIAGNOSIS — M069 Rheumatoid arthritis, unspecified: Secondary | ICD-10-CM

## 2014-03-18 DIAGNOSIS — Y831 Surgical operation with implant of artificial internal device as the cause of abnormal reaction of the patient, or of later complication, without mention of misadventure at the time of the procedure: Secondary | ICD-10-CM | POA: Diagnosis present

## 2014-03-18 DIAGNOSIS — D649 Anemia, unspecified: Secondary | ICD-10-CM

## 2014-03-18 DIAGNOSIS — A488 Other specified bacterial diseases: Secondary | ICD-10-CM

## 2014-03-18 DIAGNOSIS — Z79899 Other long term (current) drug therapy: Secondary | ICD-10-CM

## 2014-03-18 HISTORY — PX: EXCISIONAL TOTAL KNEE ARTHROPLASTY WITH ANTIBIOTIC SPACERS: SHX5827

## 2014-03-18 LAB — TYPE AND SCREEN
ABO/RH(D): O POS
ANTIBODY SCREEN: NEGATIVE

## 2014-03-18 SURGERY — REMOVAL, TOTAL ARTHROPLASTY HARDWARE, KNEE, WITH ANTIBIOTIC SPACER INSERTION
Anesthesia: General | Site: Knee | Laterality: Right

## 2014-03-18 MED ORDER — MINERAL OIL LIGHT 100 % EX OIL
TOPICAL_OIL | CUTANEOUS | Status: AC
  Filled 2014-03-18: qty 25

## 2014-03-18 MED ORDER — FUROSEMIDE 40 MG PO TABS
40.0000 mg | ORAL_TABLET | Freq: Every morning | ORAL | Status: DC
  Administered 2014-03-19 – 2014-03-21 (×3): 40 mg via ORAL
  Filled 2014-03-18 (×3): qty 1

## 2014-03-18 MED ORDER — LIDOCAINE HCL (CARDIAC) 20 MG/ML IV SOLN
INTRAVENOUS | Status: AC
  Filled 2014-03-18: qty 5

## 2014-03-18 MED ORDER — VANCOMYCIN HCL 10 G IV SOLR
1500.0000 mg | INTRAVENOUS | Status: AC
  Administered 2014-03-18 (×2): 1500 mg via INTRAVENOUS
  Filled 2014-03-18: qty 1500

## 2014-03-18 MED ORDER — DOCUSATE SODIUM 100 MG PO CAPS
100.0000 mg | ORAL_CAPSULE | Freq: Two times a day (BID) | ORAL | Status: DC
  Administered 2014-03-18 – 2014-03-21 (×6): 100 mg via ORAL

## 2014-03-18 MED ORDER — FENTANYL CITRATE 0.05 MG/ML IJ SOLN
INTRAMUSCULAR | Status: AC
  Filled 2014-03-18: qty 2

## 2014-03-18 MED ORDER — ONDANSETRON HCL 4 MG/2ML IJ SOLN
INTRAMUSCULAR | Status: AC
  Filled 2014-03-18: qty 2

## 2014-03-18 MED ORDER — LIDOCAINE HCL (CARDIAC) 20 MG/ML IV SOLN
INTRAVENOUS | Status: DC | PRN
  Administered 2014-03-18: 100 mg via INTRAVENOUS

## 2014-03-18 MED ORDER — LOSARTAN POTASSIUM 50 MG PO TABS
100.0000 mg | ORAL_TABLET | Freq: Every morning | ORAL | Status: DC
  Administered 2014-03-19 – 2014-03-21 (×3): 100 mg via ORAL
  Filled 2014-03-18 (×3): qty 2

## 2014-03-18 MED ORDER — HYDROMORPHONE HCL PF 1 MG/ML IJ SOLN
INTRAMUSCULAR | Status: DC | PRN
  Administered 2014-03-18 (×4): 0.5 mg via INTRAVENOUS

## 2014-03-18 MED ORDER — CHLORHEXIDINE GLUCONATE 4 % EX LIQD: 60.0000 mL | Freq: Once | CUTANEOUS | Status: DC

## 2014-03-18 MED ORDER — METHOCARBAMOL 500 MG PO TABS
500.0000 mg | ORAL_TABLET | Freq: Four times a day (QID) | ORAL | Status: DC | PRN
  Administered 2014-03-18 – 2014-03-19 (×2): 500 mg via ORAL
  Filled 2014-03-18 (×2): qty 1

## 2014-03-18 MED ORDER — ACETAMINOPHEN 325 MG PO TABS: 650.0000 mg | ORAL_TABLET | Freq: Four times a day (QID) | ORAL | Status: DC | PRN

## 2014-03-18 MED ORDER — PREDNISONE 20 MG PO TABS
20.0000 mg | ORAL_TABLET | Freq: Two times a day (BID) | ORAL | Status: AC
  Administered 2014-03-19 (×2): 20 mg via ORAL
  Filled 2014-03-18 (×2): qty 1

## 2014-03-18 MED ORDER — PREDNISONE 10 MG PO TABS
10.0000 mg | ORAL_TABLET | Freq: Two times a day (BID) | ORAL | Status: AC
  Administered 2014-03-20 (×2): 10 mg via ORAL
  Filled 2014-03-18 (×2): qty 1

## 2014-03-18 MED ORDER — DEXAMETHASONE SODIUM PHOSPHATE 10 MG/ML IJ SOLN: INTRAMUSCULAR | Status: DC | PRN

## 2014-03-18 MED ORDER — SODIUM CHLORIDE 0.9 % IJ SOLN
INTRAMUSCULAR | Status: AC
  Filled 2014-03-18: qty 50

## 2014-03-18 MED ORDER — VANCOMYCIN HCL 1000 MG IV SOLR
INTRAVENOUS | Status: DC | PRN
  Administered 2014-03-18: 3 g

## 2014-03-18 MED ORDER — DEXAMETHASONE SODIUM PHOSPHATE 10 MG/ML IJ SOLN
INTRAMUSCULAR | Status: AC
  Filled 2014-03-18: qty 1

## 2014-03-18 MED ORDER — DILTIAZEM HCL ER COATED BEADS 240 MG PO CP24
240.0000 mg | ORAL_CAPSULE | Freq: Every morning | ORAL | Status: DC
  Administered 2014-03-19 – 2014-03-21 (×3): 240 mg via ORAL
  Filled 2014-03-18 (×3): qty 1

## 2014-03-18 MED ORDER — MIDAZOLAM HCL 2 MG/2ML IJ SOLN
INTRAMUSCULAR | Status: AC
  Filled 2014-03-18: qty 2

## 2014-03-18 MED ORDER — PREDNISONE 5 MG PO TABS
5.0000 mg | ORAL_TABLET | Freq: Two times a day (BID) | ORAL | Status: DC
  Administered 2014-03-21: 5 mg via ORAL
  Filled 2014-03-18 (×2): qty 1

## 2014-03-18 MED ORDER — PROPOFOL 10 MG/ML IV BOLUS
INTRAVENOUS | Status: DC | PRN
  Administered 2014-03-18: 200 mg via INTRAVENOUS

## 2014-03-18 MED ORDER — METOCLOPRAMIDE HCL 10 MG PO TABS: 5.0000 mg | ORAL_TABLET | Freq: Three times a day (TID) | ORAL | Status: DC | PRN

## 2014-03-18 MED ORDER — FENTANYL CITRATE 0.05 MG/ML IJ SOLN
INTRAMUSCULAR | Status: DC | PRN
  Administered 2014-03-18: 50 ug via INTRAVENOUS
  Administered 2014-03-18: 100 ug via INTRAVENOUS
  Administered 2014-03-18 (×2): 50 ug via INTRAVENOUS
  Administered 2014-03-18: 100 ug via INTRAVENOUS

## 2014-03-18 MED ORDER — 0.9 % SODIUM CHLORIDE (POUR BTL) OPTIME
TOPICAL | Status: DC | PRN
  Administered 2014-03-18: 1000 mL

## 2014-03-18 MED ORDER — PROPOFOL 10 MG/ML IV BOLUS
INTRAVENOUS | Status: AC
  Filled 2014-03-18: qty 20

## 2014-03-18 MED ORDER — FENTANYL CITRATE 0.05 MG/ML IJ SOLN
INTRAMUSCULAR | Status: AC
  Filled 2014-03-18: qty 5

## 2014-03-18 MED ORDER — SODIUM CHLORIDE 0.9 % IV SOLN
INTRAVENOUS | Status: DC
  Administered 2014-03-18 – 2014-03-20 (×3): via INTRAVENOUS

## 2014-03-18 MED ORDER — HYDROMORPHONE HCL PF 1 MG/ML IJ SOLN
INTRAMUSCULAR | Status: AC
  Filled 2014-03-18: qty 1

## 2014-03-18 MED ORDER — PREDNISONE 5 MG PO TABS
5.0000 mg | ORAL_TABLET | Freq: Every day | ORAL | Status: DC
  Filled 2014-03-18 (×2): qty 1

## 2014-03-18 MED ORDER — MIDAZOLAM HCL 5 MG/5ML IJ SOLN
INTRAMUSCULAR | Status: DC | PRN
  Administered 2014-03-18: 2 mg via INTRAVENOUS

## 2014-03-18 MED ORDER — SUCCINYLCHOLINE CHLORIDE 20 MG/ML IJ SOLN
INTRAMUSCULAR | Status: DC | PRN
  Administered 2014-03-18: 100 mg via INTRAVENOUS

## 2014-03-18 MED ORDER — FENTANYL CITRATE 0.05 MG/ML IJ SOLN
25.0000 ug | INTRAMUSCULAR | Status: DC | PRN
  Administered 2014-03-18: 50 ug via INTRAVENOUS

## 2014-03-18 MED ORDER — METHOCARBAMOL 1000 MG/10ML IJ SOLN
500.0000 mg | Freq: Four times a day (QID) | INTRAVENOUS | Status: DC | PRN
  Filled 2014-03-18: qty 5

## 2014-03-18 MED ORDER — ACETAMINOPHEN 650 MG RE SUPP: 650.0000 mg | Freq: Four times a day (QID) | RECTAL | Status: DC | PRN

## 2014-03-18 MED ORDER — PANTOPRAZOLE SODIUM 40 MG PO TBEC
40.0000 mg | DELAYED_RELEASE_TABLET | Freq: Every day | ORAL | Status: DC
  Administered 2014-03-19 – 2014-03-21 (×3): 40 mg via ORAL
  Filled 2014-03-18 (×3): qty 1

## 2014-03-18 MED ORDER — ACETAMINOPHEN 10 MG/ML IV SOLN
1000.0000 mg | Freq: Once | INTRAVENOUS | Status: AC
  Administered 2014-03-18: 1000 mg via INTRAVENOUS
  Filled 2014-03-18: qty 100

## 2014-03-18 MED ORDER — DIPHENHYDRAMINE HCL 12.5 MG/5ML PO ELIX: 12.5000 mg | ORAL_SOLUTION | ORAL | Status: DC | PRN

## 2014-03-18 MED ORDER — ONDANSETRON HCL 4 MG/2ML IJ SOLN
INTRAMUSCULAR | Status: DC | PRN
  Administered 2014-03-18: 4 mg via INTRAVENOUS

## 2014-03-18 MED ORDER — MENTHOL 3 MG MT LOZG: 1.0000 | LOZENGE | OROMUCOSAL | Status: DC | PRN

## 2014-03-18 MED ORDER — RIVAROXABAN 10 MG PO TABS
10.0000 mg | ORAL_TABLET | Freq: Every day | ORAL | Status: DC
  Administered 2014-03-19 – 2014-03-21 (×3): 10 mg via ORAL
  Filled 2014-03-18 (×4): qty 1

## 2014-03-18 MED ORDER — TRANEXAMIC ACID 100 MG/ML IV SOLN
1000.0000 mg | INTRAVENOUS | Status: AC
  Administered 2014-03-18: 1000 mg via INTRAVENOUS
  Filled 2014-03-18: qty 10

## 2014-03-18 MED ORDER — FLEET ENEMA 7-19 GM/118ML RE ENEM
1.0000 | ENEMA | Freq: Once | RECTAL | Status: AC | PRN
Start: 2014-03-18 — End: 2014-03-18

## 2014-03-18 MED ORDER — BUPIVACAINE HCL (PF) 0.25 % IJ SOLN
INTRAMUSCULAR | Status: AC
  Filled 2014-03-18: qty 30

## 2014-03-18 MED ORDER — OXYCODONE HCL 5 MG PO TABS
5.0000 mg | ORAL_TABLET | ORAL | Status: DC | PRN
  Administered 2014-03-18 – 2014-03-21 (×9): 10 mg via ORAL
  Filled 2014-03-18 (×9): qty 2

## 2014-03-18 MED ORDER — ACETAMINOPHEN 500 MG PO TABS
1000.0000 mg | ORAL_TABLET | Freq: Four times a day (QID) | ORAL | Status: AC
  Administered 2014-03-18 – 2014-03-19 (×4): 1000 mg via ORAL
  Filled 2014-03-18 (×4): qty 2

## 2014-03-18 MED ORDER — DEXAMETHASONE SODIUM PHOSPHATE 10 MG/ML IJ SOLN
10.0000 mg | Freq: Once | INTRAMUSCULAR | Status: AC
  Administered 2014-03-18: 10 mg via INTRAVENOUS

## 2014-03-18 MED ORDER — MOMETASONE FURO-FORMOTEROL FUM 100-5 MCG/ACT IN AERO
2.0000 | INHALATION_SPRAY | Freq: Two times a day (BID) | RESPIRATORY_TRACT | Status: DC | PRN
  Filled 2014-03-18: qty 8.8

## 2014-03-18 MED ORDER — VANCOMYCIN HCL 1000 MG IV SOLR
INTRAVENOUS | Status: AC
Start: 2014-03-18 — End: 2014-03-18
  Filled 2014-03-18: qty 3000

## 2014-03-18 MED ORDER — HYDROMORPHONE HCL PF 1 MG/ML IJ SOLN
0.2500 mg | INTRAMUSCULAR | Status: DC | PRN
  Administered 2014-03-18 (×4): 0.5 mg via INTRAVENOUS

## 2014-03-18 MED ORDER — PHENOL 1.4 % MT LIQD: 1.0000 | OROMUCOSAL | Status: DC | PRN

## 2014-03-18 MED ORDER — LACTATED RINGERS IV SOLN
INTRAVENOUS | Status: DC
  Administered 2014-03-18: 1000 mL via INTRAVENOUS

## 2014-03-18 MED ORDER — POLYETHYLENE GLYCOL 3350 17 G PO PACK: 17.0000 g | PACK | Freq: Every day | ORAL | Status: DC | PRN

## 2014-03-18 MED ORDER — BUPIVACAINE LIPOSOME 1.3 % IJ SUSP
20.0000 mL | Freq: Once | INTRAMUSCULAR | Status: DC
  Filled 2014-03-18: qty 20

## 2014-03-18 MED ORDER — BISACODYL 10 MG RE SUPP: 10.0000 mg | Freq: Every day | RECTAL | Status: DC | PRN

## 2014-03-18 MED ORDER — SODIUM CHLORIDE 0.9 % IV SOLN
INTRAVENOUS | Status: DC
Start: 2014-03-18 — End: 2014-03-18

## 2014-03-18 MED ORDER — METOCLOPRAMIDE HCL 5 MG/ML IJ SOLN: 5.0000 mg | Freq: Three times a day (TID) | INTRAMUSCULAR | Status: DC | PRN

## 2014-03-18 MED ORDER — PROMETHAZINE HCL 25 MG/ML IJ SOLN: 6.2500 mg | INTRAMUSCULAR | Status: DC | PRN

## 2014-03-18 MED ORDER — SODIUM CHLORIDE 0.9 % IR SOLN
Status: DC | PRN
  Administered 2014-03-18: 6000 mL

## 2014-03-18 MED ORDER — ONDANSETRON HCL 4 MG/2ML IJ SOLN: 4.0000 mg | Freq: Four times a day (QID) | INTRAMUSCULAR | Status: DC | PRN

## 2014-03-18 MED ORDER — ONDANSETRON HCL 4 MG PO TABS: 4.0000 mg | ORAL_TABLET | Freq: Four times a day (QID) | ORAL | Status: DC | PRN

## 2014-03-18 MED ORDER — HYDROMORPHONE HCL PF 2 MG/ML IJ SOLN
INTRAMUSCULAR | Status: AC
  Filled 2014-03-18: qty 1

## 2014-03-18 MED ORDER — METOPROLOL SUCCINATE ER 50 MG PO TB24
50.0000 mg | ORAL_TABLET | Freq: Every morning | ORAL | Status: DC
  Administered 2014-03-19 – 2014-03-21 (×3): 50 mg via ORAL
  Filled 2014-03-18 (×3): qty 1

## 2014-03-18 MED ORDER — MORPHINE SULFATE 2 MG/ML IJ SOLN
1.0000 mg | INTRAMUSCULAR | Status: DC | PRN
  Administered 2014-03-18 (×2): 2 mg via INTRAVENOUS
  Filled 2014-03-18 (×2): qty 1

## 2014-03-18 SURGICAL SUPPLY — 58 items
BAG SPEC THK2 15X12 ZIP CLS (MISCELLANEOUS) ×1
BAG ZIPLOCK 12X15 (MISCELLANEOUS) ×3 IMPLANT
BANDAGE ELASTIC 6 VELCRO ST LF (GAUZE/BANDAGES/DRESSINGS) ×3 IMPLANT
BANDAGE ESMARK 6X9 LF (GAUZE/BANDAGES/DRESSINGS) ×1 IMPLANT
BLADE SAG 18X100X1.27 (BLADE) ×3 IMPLANT
BLADE SAW SGTL 11.0X1.19X90.0M (BLADE) ×6 IMPLANT
BNDG CMPR 9X6 STRL LF SNTH (GAUZE/BANDAGES/DRESSINGS) ×1
BNDG ESMARK 6X9 LF (GAUZE/BANDAGES/DRESSINGS) ×3
BONE CEMENT GENTAMICIN (Cement) ×9 IMPLANT
CEMENT BONE GENTAMICIN 40 (Cement) ×3 IMPLANT
CLOSURE WOUND 1/2 X4 (GAUZE/BANDAGES/DRESSINGS) ×2
CUFF TOURN SGL QUICK 34 (TOURNIQUET CUFF) ×6
CUFF TRNQT CYL 34X4X40X1 (TOURNIQUET CUFF) ×2 IMPLANT
DRAPE EXTREMITY T 121X128X90 (DRAPE) ×3 IMPLANT
DRAPE POUCH INSTRU U-SHP 10X18 (DRAPES) ×3 IMPLANT
DRAPE U-SHAPE 47X51 STRL (DRAPES) ×3 IMPLANT
DRSG ADAPTIC 3X8 NADH LF (GAUZE/BANDAGES/DRESSINGS) ×3 IMPLANT
DRSG PAD ABDOMINAL 8X10 ST (GAUZE/BANDAGES/DRESSINGS) IMPLANT
DURAPREP 26ML APPLICATOR (WOUND CARE) ×3 IMPLANT
ELECT REM PT RETURN 9FT ADLT (ELECTROSURGICAL) ×3
ELECTRODE REM PT RTRN 9FT ADLT (ELECTROSURGICAL) ×1 IMPLANT
EVACUATOR 1/8 PVC DRAIN (DRAIN) ×3 IMPLANT
FACESHIELD WRAPAROUND (MASK) ×15 IMPLANT
FACESHIELD WRAPAROUND OR TEAM (MASK) ×5 IMPLANT
GLOVE BIO SURGEON STRL SZ7.5 (GLOVE) ×3 IMPLANT
GLOVE BIO SURGEON STRL SZ8 (GLOVE) ×3 IMPLANT
GLOVE BIOGEL PI IND STRL 8 (GLOVE) ×1 IMPLANT
GLOVE BIOGEL PI INDICATOR 8 (GLOVE) ×2
GOWN STRL REUS W/TWL LRG LVL3 (GOWN DISPOSABLE) ×3 IMPLANT
GOWN STRL REUS W/TWL XL LVL3 (GOWN DISPOSABLE) ×3 IMPLANT
HANDPIECE INTERPULSE COAX TIP (DISPOSABLE) ×3
IMMOBILIZER KNEE 20 (SOFTGOODS) ×3 IMPLANT
IMMOBILIZER KNEE 20 THIGH 36 (SOFTGOODS) IMPLANT
INSERT TIB LCS RP STD+ 20 (Knees) ×2 IMPLANT
KIT BASIN OR (CUSTOM PROCEDURE TRAY) ×3 IMPLANT
MANIFOLD NEPTUNE II (INSTRUMENTS) ×3 IMPLANT
NS IRRIG 1000ML POUR BTL (IV SOLUTION) ×6 IMPLANT
PACK TOTAL JOINT (CUSTOM PROCEDURE TRAY) ×3 IMPLANT
PAD ABD 7.5X8 STRL (GAUZE/BANDAGES/DRESSINGS) ×3 IMPLANT
PADDING CAST COTTON 6X4 STRL (CAST SUPPLIES) ×5 IMPLANT
POSITIONER SURGICAL ARM (MISCELLANEOUS) ×3 IMPLANT
SET HNDPC FAN SPRY TIP SCT (DISPOSABLE) ×1 IMPLANT
SPONGE GAUZE 4X4 12PLY (GAUZE/BANDAGES/DRESSINGS) ×3 IMPLANT
STAPLER VISISTAT 35W (STAPLE) ×3 IMPLANT
STRIP CLOSURE SKIN 1/2X4 (GAUZE/BANDAGES/DRESSINGS) ×2 IMPLANT
SUCTION FRAZIER TIP 10 FR DISP (SUCTIONS) ×3 IMPLANT
SUT VIC AB 1 CT1 27 (SUTURE) ×9
SUT VIC AB 1 CT1 27XBRD ANTBC (SUTURE) ×3 IMPLANT
SUT VIC AB 2-0 CT1 27 (SUTURE) ×9
SUT VIC AB 2-0 CT1 TAPERPNT 27 (SUTURE) ×3 IMPLANT
SWAB COLLECTION DEVICE MRSA (MISCELLANEOUS) ×3 IMPLANT
TOWEL OR 17X26 10 PK STRL BLUE (TOWEL DISPOSABLE) ×6 IMPLANT
TOWER CARTRIDGE SMART MIX (DISPOSABLE) ×3 IMPLANT
TRAY FOLEY CATH 14FRSI W/METER (CATHETERS) ×1 IMPLANT
TRAY FOLEY CATH 16FR SILVER (SET/KITS/TRAYS/PACK) ×2 IMPLANT
TUBE ANAEROBIC SPECIMEN COL (MISCELLANEOUS) ×3 IMPLANT
WATER STERILE IRR 1500ML POUR (IV SOLUTION) ×3 IMPLANT
WRAP KNEE MAXI GEL POST OP (GAUZE/BANDAGES/DRESSINGS) ×6 IMPLANT

## 2014-03-18 NOTE — Anesthesia Preprocedure Evaluation (Addendum)
Anesthesia Evaluation  Patient identified by MRN, date of birth, ID band Patient awake    Reviewed: Allergy & Precautions, H&P , NPO status , Patient's Chart, lab work & pertinent test results  Airway Mallampati: III TM Distance: <3 FB Neck ROM: Full    Dental no notable dental hx.    Pulmonary sleep apnea , former smoker,  breath sounds clear to auscultation  Pulmonary exam normal       Cardiovascular hypertension, Pt. on medications and Pt. on home beta blockers Rhythm:Regular Rate:Normal     Neuro/Psych negative neurological ROS  negative psych ROS   GI/Hepatic negative GI ROS, Neg liver ROS,   Endo/Other  Morbid obesity  Renal/GU negative Renal ROS  negative genitourinary   Musculoskeletal  (+) Arthritis -, Rheumatoid disorders and on steriods ,    Abdominal (+) + obese,   Peds negative pediatric ROS (+)  Hematology  (+) anemia ,   Anesthesia Other Findings   Reproductive/Obstetrics negative OB ROS                          Anesthesia Physical Anesthesia Plan  ASA: III  Anesthesia Plan: General   Post-op Pain Management:    Induction: Intravenous  Airway Management Planned: Oral ETT  Additional Equipment:   Intra-op Plan:   Post-operative Plan: Extubation in OR  Informed Consent: I have reviewed the patients History and Physical, chart, labs and discussed the procedure including the risks, benefits and alternatives for the proposed anesthesia with the patient or authorized representative who has indicated his/her understanding and acceptance.   Dental advisory given  Plan Discussed with: CRNA and Surgeon  Anesthesia Plan Comments:         Anesthesia Quick Evaluation

## 2014-03-18 NOTE — H&P (View-Only) (Signed)
[name] DOB: 1960-11-03 Married / Language: English / Race: White Male  Date of Admission:  03-18-2014  Chief Complaint:  Infected Right Total Knee  History of Present Illness The patient is a 53 year old male who comes in for a preoperative History and Physical. The patient is scheduled for a right resecrion of right total knee to be performed by Dr. Gus Rankin. Aluisio, MD at [facility] on 03-18-2014. The patient is a 53 year old male presenting for follow up and H&P. The patient comes in 3 years out from right total knee arthroplasty (revision). The patient states that he is not doing well at this time. The pain is under fair control at this time and describe their pain as mild to moderate. They are currently on no medication for their pain. He was hospitalized April 30th and discharged May 4th. He was put in the hospital for cellulitis. He has been on IV Vancomycin and Clindamycin since he was discharged. We review his bone scan and the lab resuts. 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 He is going to require resection arthroplasty. Unfortunately he is having a lot of difficulty with the right leg. The knee itself is really not hurting. He has had multiple bouts of cellulitis with a lot of swelling in the leg. He has not had any issues specifically with the knee other than some swelling. They have failed non-operative management including IV antibiotics for the cellulitis. It is felt that they would benefit from undergoing resection of the joint replacement. Risks and benefits of the procedure have been discussed with the patient and they elect to proceed with surgery. There are no active contraindications to surgery such as rapidly progressive neurological disease.  Allergies Penicillin VK *PENICILLINS*. Rash, Itching. Avelox ABC Pack *FLUOROQUINOLONES*. Facial Flushing, Coughing Sulfanilamide *CHEMICALS*. Rash. Erythromycin *MACROLIDES*. Itching,  Rash.   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 father Cancer. Mother. mother Hypertension. mother and father Kidney disease. father    Social History Current work status. working full time Illicit drug use. no Alcohol use. never consumed alcohol no Children. 1 Tobacco use. Former smoker. never smoker Living situation. live with spouse Pain Contract. no Tobacco / smoke exposure. no Exercise. Exercises rarely Drug/Alcohol Rehab (Currently). no Drug/Alcohol Rehab (Previously). no Marital status. married Number of flights of stairs before winded. 1    Medication History Clindamycin HCl ( Oral) Specific dose unknown - Active. Vancomycin HCl ( Intravenous) Specific dose unknown - Active. Hydroxychloroquine Sulfate (200MG  Tablet, Oral) Active. PredniSONE (5MG  Tablet, Oral) Active. Furosemide (40MG  Tablet, Oral) Active. Metoprolol Succinate ER (50MG  Tablet ER 24HR, Oral) Active. Diltiazem HCl ER Coated Beads (240MG  Capsule ER 24HR, Oral) Active. Aciphex (20MG  Tablet DR, Oral) Active. Losartan Potassium (100MG  Tablet, Oral) Active.    Past Surgical History Total Hip Replacement. bilateral Hip Fracture and Surgery. bilateral Total Knee Replacement. bilateral   Medical History High blood pressure Gastroesophageal Reflux Disease Sleep Apnea. no cpap Rheumatoid Arthritis Asthma Cellulitis. typically lower extremities    Vitals Weight: 290 lb Height: 68 in Weight was reported by patient. Height was reported by patient. Body Surface Area: 2.51 m Body Mass Index: 44.09 kg/m Pulse: 78 (Regular) Resp.: 16 (Unlabored) BP: 138/84 (Sitting, Left Arm, Standard)   Physical Exam The physical exam findings are as follows:   General Mental Status -  Alert,  cooperative and good historian. General Appearance- pleasant. Not in acute distress. Orientation- Oriented X3. Build & Nutrition- Well nourished and Well developed.   Head and Neck Head- normocephalic, atraumatic . Neck Global Assessment- supple. no bruit auscultated on the right and no bruit auscultated on the left.   Eye Vision- Wears corrective lenses. Pupil- Bilateral- Regular and Round. Motion- Bilateral- EOMI.   Chest and Lung Exam Auscultation: Breath sounds:- clear at anterior chest wall and - clear at posterior chest wall. Adventitious sounds:- No Adventitious sounds.   Cardiovascular Auscultation:Rhythm- Regular rate and rhythm. Heart Sounds- S1 WNL and S2 WNL. Murmurs & Other Heart Sounds:Auscultation of the heart reveals - No Murmurs.   Abdomen Inspection:Contour- Generalized moderate distention. Palpation/Percussion:Tenderness- Abdomen is non-tender to palpation. Rigidity (guarding)- Abdomen is soft. Auscultation:Auscultation of the abdomen reveals - Bowel sounds normal.   Male Genitourinary  Not done, not pertinent to present illness  Musculoskeletal On exam he is in no distress. His right lower leg is very swollen. His right knee also has an effusion. There is no significant warmth about the knee. No erythema about the knee.  Bone Scan - showed increased uptake in all three phases indicative of aseptic loosening or infection.   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 Lequita Halt.  Plan is to go home.  PCP - Dr. Jarome Matin - Patient has been seen preoperatively and felt to be stable for surgery.  The patient does not have any contraindications and will receive TXA (tranexamic acid) prior to surgery.  Signed electronically by Lauraine Rinne, III PA-C

## 2014-03-18 NOTE — Interval H&P Note (Signed)
History and Physical Interval Note:  03/18/2014 2:57 PM  [name]  has presented today for surgery, with the diagnosis of INFECTED RIGHT TOTAL KNEE ARTHOPLASTY  The various methods of treatment have been discussed with the patient and family. After consideration of risks, benefits and other options for treatment, the patient has consented to  Procedure(s): RIGHT KNEE RESECTION ARTHROPLASTY WITH ANTIBIOTIC SPACERS (Right) as a surgical intervention .  The patient's history has been reviewed, patient examined, no change in status, stable for surgery.  I have reviewed the patient's chart and labs.  Questions were answered to the patient's satisfaction.     [name] Fellers [employer]

## 2014-03-18 NOTE — Brief Op Note (Signed)
03/18/2014  5:24 PM  PATIENT:  [name]  53 y.o. male  PRE-OPERATIVE DIAGNOSIS:  INFECTED RIGHT TOTAL KNEE ARTHOPLASTY  POST-OPERATIVE DIAGNOSIS:  INFECTED RIGHT TOTAL KNEE ARTHOPLASTY  PROCEDURE:  Procedure(s): RIGHT KNEE RESECTION ARTHROPLASTY WITH ANTIBIOTIC SPACERS (Right)  SURGEON:  Surgeon(s) and Role:    * Loanne Drilling, MD - Primary  PHYSICIAN ASSISTANT:   ASSISTANTS: Avel Peace, PA-C   ANESTHESIA:   general  EBL:  Total I/O In: -  Out: 460 [Urine:460]  BLOOD ADMINISTERED:none  DRAINS: (Medium) Hemovact drain(s) in the right knee with  Suction Open   LOCAL MEDICATIONS USED:  NONE  COUNTS:  YES  TOURNIQUET:   Total Tourniquet Time Documented: Thigh (Right) - 85 minutes Total: Thigh (Right) - 85 minutes   DICTATION: .Other Dictation: Dictation Number [id number]  PLAN OF CARE: Admit for overnight observation  PATIENT DISPOSITION:  PACU - hemodynamically stable.

## 2014-03-18 NOTE — Transfer of Care (Signed)
Immediate Anesthesia Transfer of Care Note  Patient: [name]  Procedure(s) Performed: Procedure(s): RIGHT KNEE RESECTION ARTHROPLASTY WITH ANTIBIOTIC SPACERS (Right)  Patient Location: PACU  Anesthesia Type:General  Level of Consciousness: awake, alert  and patient cooperative  Airway & Oxygen Therapy: Patient Spontanous Breathing and Patient connected to face mask oxygen  Post-op Assessment: Report given to PACU RN and Post -op Vital signs reviewed and stable  Post vital signs: Reviewed and stable  Complications: No apparent anesthesia complications

## 2014-03-18 NOTE — Anesthesia Postprocedure Evaluation (Signed)
  Anesthesia Post-op Note  Patient: [name]  Procedure(s) Performed: Procedure(s) (LRB): RIGHT KNEE RESECTION ARTHROPLASTY WITH ANTIBIOTIC SPACERS (Right)  Patient Location: PACU  Anesthesia Type: General  Level of Consciousness: awake and alert   Airway and Oxygen Therapy: Patient Spontanous Breathing  Post-op Pain: mild  Post-op Assessment: Post-op Vital signs reviewed, Patient's Cardiovascular Status Stable, Respiratory Function Stable, Patent Airway and No signs of Nausea or vomiting  Last Vitals:  Filed Vitals:   03/18/14 1947  BP: 150/93  Pulse: 64  Temp: 36.7 C  Resp:     Post-op Vital Signs: stable   Complications: No apparent anesthesia complications

## 2014-03-19 ENCOUNTER — Encounter (HOSPITAL_COMMUNITY): Payer: Self-pay | Admitting: Orthopedic Surgery

## 2014-03-19 DIAGNOSIS — M069 Rheumatoid arthritis, unspecified: Secondary | ICD-10-CM

## 2014-03-19 DIAGNOSIS — T8450XA Infection and inflammatory reaction due to unspecified internal joint prosthesis, initial encounter: Principal | ICD-10-CM

## 2014-03-19 DIAGNOSIS — Z96659 Presence of unspecified artificial knee joint: Secondary | ICD-10-CM

## 2014-03-19 LAB — BASIC METABOLIC PANEL
BUN: 16 mg/dL (ref 6–23)
CALCIUM: 8.5 mg/dL (ref 8.4–10.5)
CO2: 26 mEq/L (ref 19–32)
Chloride: 105 mEq/L (ref 96–112)
Creatinine, Ser: 1.03 mg/dL (ref 0.50–1.35)
GFR calc Af Amer: >90 mL/min (ref >90)
GFR, EST NON AFRICAN AMERICAN: 81 mL/min — AB (ref >90)
Glucose, Bld: 165 mg/dL — ABNORMAL HIGH (ref 70–99)
Potassium: 4.5 mEq/L (ref 3.7–5.3)
Sodium: 140 mEq/L (ref 137–147)

## 2014-03-19 LAB — CBC
HCT: 32.8 % — ABNORMAL LOW (ref 39.0–52.0)
Hemoglobin: 10.1 g/dL — ABNORMAL LOW (ref 13.0–17.0)
MCH: 25.4 pg — AB (ref 26.0–34.0)
MCHC: 30.8 g/dL (ref 30.0–36.0)
MCV: 82.4 fL (ref 78.0–100.0)
PLATELETS: 247 10*3/uL (ref 150–400)
RBC: 3.98 MIL/uL — AB (ref 4.22–5.81)
RDW: 16.2 % — ABNORMAL HIGH (ref 11.5–15.5)
WBC: 10.4 10*3/uL (ref 4.0–10.5)

## 2014-03-19 MED ORDER — DEXTROSE 5 % IV SOLN
2.0000 g | INTRAVENOUS | Status: DC
  Administered 2014-03-19 – 2014-03-21 (×3): 2 g via INTRAVENOUS
  Filled 2014-03-19 (×3): qty 2

## 2014-03-19 NOTE — Progress Notes (Signed)
OT Cancellation Note  Patient Details Name: [name] MRN: [id number] DOB: 07-22-1961   Cancelled Treatment:    Reason Eval/Treat Not Completed: OT screened, no needs identified, will sign off. Pt states he doesn't feel he needs OT. He states he has had multiple surgeries in the past and is familiar with the 3in1. He plans to sponge initially since has to wear the KI all times and wife can help with LB self care. Will sign off per pt request.  Lennox Laity [phone] 03/19/2014, 12:01 PM

## 2014-03-19 NOTE — Op Note (Signed)
[name], [name]                 ACCOUNT NO.:  [id number]  MEDICAL RECORD NO.:  [id number]  LOCATION:  1616                         FACILITY:  [facility]  PHYSICIAN:  Ollen Gross, M.D.    DATE OF BIRTH:  06/12/1961  DATE OF PROCEDURE:  03/18/2014 DATE OF DISCHARGE:                              OPERATIVE REPORT   PREOPERATIVE DIAGNOSIS:  Infected right total knee arthroplasty.  POSTOPERATIVE DIAGNOSIS:  Infected right total knee arthroplasty.  PROCEDURE:  Right knee resection arthroplasty with antibiotic spacer placement.  SURGEON:  Ollen Gross, M.D.  ASSISTANT:  Alexzandrew L. Perkins, PA-C  ANESTHESIA:  General.  ESTIMATED BLOOD LOSS:  Minimal.  DRAINS:  Hemovac x1.  TOURNIQUET TIME:  83 minutes at 300 mmHg.  COMPLICATIONS:  None.  CONDITION:  Stable to recovery.  BRIEF CLINICAL NOTE:  [name] is a 53 year old male who had a right total knee arthroplasty revision done several years ago.  He has done well and then over the past several months has developed recurrent cellulitis in that right leg.  Recently, he started to develop knee pain and a knee effusion.  I saw him in the office a few weeks ago.  We aspirated the knee and unfortunately it grew out Serratia.  He was subsequently taken to the operating room today for resection arthroplasty and antibiotic spacer placement.  PROCEDURE IN DETAIL:  After successful administration of general anesthetic, a tourniquet was placed high on his right thigh and his right lower extremity was prepped and draped in the usual sterile fashion.  Extremity was wrapped in Esmarch.  Tourniquet was inflated to 300 mmHg.  A midline incision made with a #10 blade through subcutaneous tissue.  There was an abscess present in the subcutaneous tissue, just lateral to the knee joint over the proximal tibia.  It does not appear to communicate with the joint, but is in such close proximity that I am sure there were some micro communication.   This was cleaned out and the walls were debrided.  A fresh blade was then used to make a medial parapatellar arthrotomy.  There was the same fluid in the knee as there was in that cavity.  This was sent for Gram stain, culture, and sensitivity.  Soft tissue on the proximal and medial tibia was then subperiosteally elevated to the joint line with a knife and into the semimembranosus bursa with a Cobb elevator.  Soft tissue laterally was elevated with attention being paid to avoid the patellar tendon and tibial tubercle.  A thorough synovectomy was then performed to remove all abnormal-appearing scar and inflamed tissue.  Post synovectomy, the tissues had a more normal appearance.  I then flexed the knee to 90 degrees, everted the patella, and removed the tibial polyethylene. Using a saw, I disrupted the interface between the tibial component and bone.  I was able to remove the tibial component, but the tibial sleeve remained intact.  I had to carefully used osteotomes to disrupt the interface between the sleeve, cement, and bone.  Bone was very thin posteriorly and small perforation made.  Sleeve was then removed.  We removed the rest of the cement from the tibial canal, but  had to leave some of the posterior cement as it was essentially creating posterior wall of the tibia.  The tibia canal was then thoroughly irrigated with saline solution with pulsatile lavage.  I had to leave the cement restrictor in distally again because the walls of the tibia was so thin that I did not want to fracture through them.  Utilizing osteotomes __________ the interface between the femoral component and bone and that was removed with minimal if any bone loss. All extra cement was removed also.  We then cleared the synovium off the posterior aspect of the joint with the knee in flexion.  The oscillating saw was then used to remove the patella as well as the cement from the patella and the polyethylene tabs  from the patellar component.  The wound was then copiously irrigated with 6 L of saline using pulsatile lavage.  Also irrigated that abscess cavity in the subcu tissue.  Spacer was then made utilizing spacer molds for the femur.  That was placed onto the distal femur and then we placed a 20-mm thickness LCS insert with cement in the tibia.  This created a stable extension gap.  The Hemovac drain was then placed into deep tissues and the arthrotomy closed over Hemovac drain with running #1 V-Loc suture.  Tourniquet was released with a total time of 83 minutes.  Minor bleeding was stopped with cautery.  The subcu was then closed over a __________ Hemovac. This was closed with interrupted 2-0 Vicryl.  Skin was closed with staples.  Hemovac was hooked to suction.  Incision was clean and dry and a bulky sterile dressing applied.  He was then placed into a knee immobilizer, awakened, transported to recovery in stable condition.  Note that a surgical assistant was a medical necessity for this procedure to perform in a safe and expeditious manner.  Surgical assistant was necessary for retraction of vital neurovascular structures.     Ollen Gross, M.D.     FA/MEDQ  D:  03/18/2014  T:  03/19/2014  Job:  [id number]

## 2014-03-19 NOTE — Evaluation (Signed)
Physical Therapy Evaluation Patient Details Name: [name] MRN: [id number] DOB: 26-Jul-1961 Today's Date: 03/19/2014   History of Present Illness  Pt is a 53 year old male s/p R knee arthoplasty resection with antibiotic spacer.  Clinical Impression  Patient is s/p R knee arthoplasty resection with antibiotic spacer surgery resulting in functional limitations due to the deficits listed below (see PT Problem List).  Patient will benefit from skilled PT to increase their independence and safety with mobility to allow discharge to the venue listed below.  Pt reporting increased pain this session requiring increased time for all mobility however will likely progress well.  No f/u PT upon d/c as pt will likely need PT after next surgery (removal of spacer) once ROM is not restricted.     Follow Up Recommendations No PT follow up    Equipment Recommendations  None recommended by PT    Recommendations for Other Services       Precautions / Restrictions Precautions Precautions: Knee Required Braces or Orthoses: Knee Immobilizer - Right Knee Immobilizer - Right: On at all times Restrictions Other Position/Activity Restrictions: WBAT      Mobility  Bed Mobility Overal bed mobility: Needs Assistance Bed Mobility: Supine to Sit     Supine to sit: Min assist;HOB elevated     General bed mobility comments: verbal cues for technique, assist for R LE  Transfers Overall transfer level: Needs assistance Equipment used: Rolling walker (2 wheeled) Transfers: Sit to/from Stand Sit to Stand: Min assist         General transfer comment: verbal cues for safe technique, including LE and UE placement  Ambulation/Gait Ambulation/Gait assistance: Min assist Ambulation Distance (Feet): 24 Feet Assistive device: Rolling walker (2 wheeled) Gait Pattern/deviations: Step-to pattern;Antalgic;Decreased stance time - right;Decreased step length - right     General Gait Details: pt reports  15/10 pain during ambulation so distance limited, increased time required due to pain  Stairs            Wheelchair Mobility    Modified Rankin (Stroke Patients Only)       Balance                                             Pertinent Vitals/Pain Premedicated, increased pain during mobility, activity to tolerance, ice packs applied    Home Living Family/patient expects to be discharged to:: Private residence Living Arrangements: Spouse/significant other   Type of Home: House Home Access: Stairs to enter Entrance Stairs-Rails: Right Entrance Stairs-Number of Steps: 3 Home Layout: One level Home Equipment: Walker - 2 wheels;Crutches;Bedside commode      Prior Function Level of Independence: Independent               Hand Dominance        Extremity/Trunk Assessment               Lower Extremity Assessment: RLE deficits/detail RLE Deficits / Details: fair quad contraction, assist required to lift LE, maintained KI, no ROM to R knee       Communication   Communication: No difficulties  Cognition Arousal/Alertness: Awake/alert Behavior During Therapy: WFL for tasks assessed/performed Overall Cognitive Status: Within Functional Limits for tasks assessed                      General Comments  Exercises        Assessment/Plan    PT Assessment Patient needs continued PT services  PT Diagnosis Difficulty walking;Acute pain   PT Problem List Decreased strength;Decreased mobility;Decreased knowledge of precautions;Pain;Obesity  PT Treatment Interventions Functional mobility training;Patient/family education;Gait training;DME instruction;Therapeutic activities;Therapeutic exercise   PT Goals (Current goals can be found in the Care Plan section) Acute Rehab PT Goals PT Goal Formulation: With patient Time For Goal Achievement: 03/24/14 Potential to Achieve Goals: Good    Frequency 7X/week   Barriers to  discharge        Co-evaluation               End of Session Equipment Utilized During Treatment: Gait belt;Right knee immobilizer Activity Tolerance: Patient limited by pain Patient left: in chair;with family/visitor present;with call bell/phone within reach           Time: 0830-0858 PT Time Calculation (min): 28 min   Charges:   PT Evaluation $Initial PT Evaluation Tier I: 1 Procedure PT Treatments $Gait Training: 23-37 mins   PT G CodesLynnell Catalan 03/19/2014, 10:35 AM Zenovia Jarred, PT, DPT 03/19/2014 Pager: [phone]

## 2014-03-19 NOTE — Progress Notes (Signed)
Physical Therapy Treatment Note   03/19/14 1400  PT Visit Information  Last PT Received On 03/19/14  Assistance Needed +1  History of Present Illness Pt is a 53 year old male s/p R knee arthoplasty resection with antibiotic spacer.  PT Time Calculation  PT Start Time 1432  PT Stop Time 1450  PT Time Calculation (min) 18 min  Subjective Data  Subjective Pt premedicated and ambulated in hallway.  Pt reports better then this morning however still painful.  Distance to tolerance and ice packs applied end of session.  Precautions  Precautions Knee  Required Braces or Orthoses Knee Immobilizer - Right  Knee Immobilizer - Right On at all times  Restrictions  Other Position/Activity Restrictions WBAT  Cognition  Arousal/Alertness Awake/alert  Behavior During Therapy WFL for tasks assessed/performed  Overall Cognitive Status Within Functional Limits for tasks assessed  Bed Mobility  Overal bed mobility Needs Assistance  Bed Mobility Supine to Sit  Supine to sit Min assist;HOB elevated  General bed mobility comments verbal cues for technique, assist for R LE  Transfers  Overall transfer level Needs assistance  Equipment used Rolling walker (2 wheeled)  Transfers Sit to/from Stand  Sit to Stand Min guard  General transfer comment verbal cues for safe technique, including LE and UE placement  Ambulation/Gait  Ambulation/Gait assistance Min guard  Ambulation Distance (Feet) 40 Feet  Assistive device Rolling walker (2 wheeled)  Gait Pattern/deviations Step-to pattern;Antalgic;Decreased step length - right;Decreased stance time - right  General Gait Details verbal cues for sequence and use of UEs through RW to assist with decreasing WBing on R LE for pain control, able to tolerate ambulation better this afternoon  PT - End of Session  Equipment Utilized During Treatment Right knee immobilizer  Activity Tolerance Patient tolerated treatment well  Patient left in chair;with call bell/phone  within reach;with family/visitor present  PT - Assessment/Plan  PT Plan Current plan remains appropriate  PT Frequency 7X/week  Follow Up Recommendations No PT follow up  PT equipment None recommended by PT  PT Goal Progression  Progress towards PT goals Progressing toward goals  PT General Charges  $$ ACUTE PT VISIT 1 Procedure  PT Treatments  $Gait Training 8-22 mins   Zenovia Jarred, PT, DPT 03/19/2014 Pager: [phone]

## 2014-03-19 NOTE — Consult Note (Signed)
[facility]    Date of Admission:  03/18/2014  Date of Consult:  03/19/2014  Reason for Consult:Septic arthritis of prosthetic joint with Serratia Referring Physician: Dr. Maureen Ralphs.   HPI: [name] is an 53 y.o. male with RA, recent bouts with cellulitis including with IV vancomycin who developed worsening knee pain and underwent aspiration in Dr Alusio's office with  serratia isolated on culture from May the 12th, 2015. I do not see that he has been on any antibiotics in there interval that would have had any activity against this organism (i only see vancomycin and clindamycin mentioned in notes). Ultimately he underwent resection arthroplasty yesterday. He had intraoperative cultures taken yesterday which are not growing any organism so far. Again he only appears to have received vancomycin so one would expect the serratia to grow once again this time though we are only 2 day into culture.     Past Medical History  Diagnosis Date  . Asthma   . GERD (gastroesophageal reflux disease)   . Hypertension   . OSA (obstructive sleep apnea)     "quit wearing mask several years ago" (02/11/2014)  . Rheumatoid arthritis dx'd ~ 1977  . Cellulitis of lower extremity     "usually RLL; this time it's got up to my lower abdoment" (02/11/2014)-series of antibiotics completed 02-28-14  . S/P PICC central line placement 03-16-14    right upper arm remains intact.    Past Surgical History  Procedure Laterality Date  . 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 Excisional hemorrhoidectomy    . Revision total knee arthroplasty Right 2012  . Picc line placement Right     right upper arm  . Foreign body removal Left     "BB" removal above left eye-teen yrs.  . Excisional total knee arthroplasty with antibiotic spacers Right 03/18/2014   Procedure: RIGHT KNEE RESECTION ARTHROPLASTY WITH ANTIBIOTIC SPACERS;  Surgeon: [name], MD;  Location: [facility];  Service: Orthopedics;  Laterality: Right;  ergies:   Allergies  Allergen Reactions  . Avelox [Moxifloxacin Hcl In Nacl] Hives, Itching and Other (See Comments)    redness  . Sulfa Antibiotics Itching  . Erythrocin Rash  . Penicillins Rash     Medications: I have reviewed patients current medications as documented in Epic Anti-infectives   Start     Dose/Rate Route Frequency Ordered Stop   03/19/14 1200  cefTRIAXone (ROCEPHIN) 2 g in dextrose 5 % 50 mL IVPB     2 g 100 mL/hr over 30 Minutes Intravenous Every 24 hours 03/19/14 1014     03/18/14 1544  vancomycin (VANCOCIN) powder  Status:  Discontinued       As needed 03/18/14 1544 03/18/14 1725   03/18/14 1215  vancomycin (VANCOCIN) 1,500 mg in sodium chloride 0.9 % 500 mL IVPB     1,500 mg 250 mL/hr over 120 Minutes Intravenous On call to O.R. 03/18/14 1208 03/18/14 1528      Social History:  reports that he quit smoking about 33 years ago. His smoking use included Cigarettes. He has a 3 pack-year smoking history. His smokeless tobacco use includes Snuff. He reports that he does not drink alcohol or use illicit drugs.  Family History  Problem Relation Age of Onset  . Colon cancer Mother     As in  HPI and primary teams notes otherwise 12 point review of systems is negative  Blood pressure 129/79, pulse 69, temperature 97.7 F (36.5 C), temperature source Oral, resp. rate 16, height _0  (1.727 m), weight 288 lb (130.636 kg), SpO2 98.00%. General: Alert and awake, oriented x3, not in any acute distress. HEENT: anicteric sclera, , EOMI, oropharynx clear and without exudate CVS regular rate, normal r,  Chest: , no wheezing,  Abdomen: soft nondistended, Extremities: knee with dressing and drain in place Skin: no rashes Neuro: nonfocal, strength and sensation intact   Results for orders placed during the  hospital encounter of 03/18/14 (from the past 48 hour(s))  TYPE AND SCREEN     Status: None   Collection Time    03/18/14 12:30 PM      Result Value Ref Range   ABO/RH(D) O POS     Antibody Screen NEG     Sample Expiration 03/21/2014    ANAEROBIC CULTURE     Status: None   Collection Time    03/18/14  3:38 PM      Result Value Ref Range   Specimen Description SYNOVIAL RIGHT KNEE     Special Requests NONE     Gram Stain       Value: MODERATE WBC PRESENT, PREDOMINANTLY PMN     NO ORGANISMS SEEN     Performed at [facility]   Culture       Value: NO ANAEROBES ISOLATED; CULTURE IN PROGRESS FOR 5 DAYS     Performed at [facility]   Report Status PENDING    BODY FLUID CULTURE     Status: None   Collection Time    03/18/14  3:38 PM      Result Value Ref Range   Specimen Description SYNOVIAL RIGHT KNEE     Special Requests NONE     Gram Stain       Value: MODERATE WBC PRESENT, PREDOMINANTLY PMN     NO ORGANISMS SEEN     Performed at [facility]   Culture       Value: NO GROWTH 1 DAY     Performed at [facility]   Report Status PENDING    CBC     Status: Abnormal   Collection Time    03/19/14  4:35 AM      Result Value Ref Range   WBC 10.4  4.0 - 10.5 K/uL   RBC 3.98 (*) 4.22 - 5.81 MIL/uL   Hemoglobin 10.1 (*) 13.0 - 17.0 g/dL   HCT 32.8 (*) 39.0 - 52.0 %   MCV 82.4  78.0 - 100.0 fL   MCH 25.4 (*) 26.0 - 34.0 pg   MCHC 30.8  30.0 - 36.0 g/dL   RDW 16.2 (*) 11.5 - 15.5 %   Platelets 247  150 - 400 K/uL  BASIC METABOLIC PANEL     Status: Abnormal   Collection Time    03/19/14  4:35 AM      Result Value Ref Range   Sodium 140  137 - 147 mEq/L   Potassium 4.5  3.7 - 5.3 mEq/L   Chloride 105  96 - 112 mEq/L   CO2 26  19 - 32 mEq/L   Glucose, Bld 165 (*) 70 - 99 mg/dL   BUN 16  6 - 23 mg/dL   Creatinine, Ser 1.03  0.50 - 1.35 mg/dL   Calcium 8.5  8.4 - 10.5 mg/dL   GFR calc non  Af Amer 81 (*) >90 mL/min   GFR calc Af Amer >90  >90  mL/min   Comment: (NOTE)     The eGFR has been calculated using the CKD EPI equation.     This calculation has not been validated in all clinical situations.     eGFR's persistently <90 mL/min signify possible Chronic Kidney     Disease.      Component Value Date/Time   SDES SYNOVIAL RIGHT KNEE 03/18/2014 1538   SDES SYNOVIAL RIGHT KNEE 03/18/2014 1538   SPECREQUEST NONE 03/18/2014 1538   SPECREQUEST NONE 03/18/2014 1538   CULT  Value: NO ANAEROBES ISOLATED; CULTURE IN PROGRESS FOR 5 DAYS Performed at [facility] 03/18/2014 1538   CULT  Value: NO GROWTH 1 DAY Performed at [facility] 03/18/2014 1538   REPTSTATUS PENDING 03/18/2014 1538   REPTSTATUS PENDING 03/18/2014 1538   No results found.   Recent Results (from the past 720 hour(s))  SURGICAL PCR SCREEN     Status: Abnormal   Collection Time    03/16/14  3:15 PM      Result Value Ref Range Status   MRSA, PCR NEGATIVE  NEGATIVE Final   Staphylococcus aureus POSITIVE (*) NEGATIVE Final   Comment:            The Xpert SA Assay (FDA     approved for NASAL specimens     in patients over 55 years of age),     is one component of     a comprehensive surveillance     program.  Test performance has     been validated by [employer] for patients greater     than or equal to 68 year old.     It is not intended     to diagnose infection nor to     guide or monitor treatment.  ANAEROBIC CULTURE     Status: None   Collection Time    03/18/14  3:38 PM      Result Value Ref Range Status   Specimen Description SYNOVIAL RIGHT KNEE   Final   Special Requests NONE   Final   Gram Stain     Final   Value: MODERATE WBC PRESENT, PREDOMINANTLY PMN     NO ORGANISMS SEEN     Performed at [facility]   Culture     Final   Value: NO ANAEROBES ISOLATED; CULTURE IN PROGRESS FOR 5 DAYS     Performed at [facility]   Report Status PENDING   Incomplete  BODY FLUID CULTURE     Status: None   Collection Time     03/18/14  3:38 PM      Result Value Ref Range Status   Specimen Description SYNOVIAL RIGHT KNEE   Final   Special Requests NONE   Final   Gram Stain     Final   Value: MODERATE WBC PRESENT, PREDOMINANTLY PMN     NO ORGANISMS SEEN     Performed at [facility]   Culture     Final   Value: NO GROWTH 1 DAY     Performed at [facility]   Report Status PENDING   Incomplete     Impression/Recommendation  Principal Problem:   Infection of prosthetic right knee joint   LINLEY MOXLEY is a 53 y.o. male with  RA, recent bouts of celluitis now with septic TKA sp resection arthroplasty with  placemetn of antibiotic spacer. Serratia grew from cultures from 02/23/14  Serratia was R to ancef, amox/clav but otherwise S to ceftriaxone, ceftaz, tobra, gent,cipro, levo.  #1 Septic prosthetic TKA sp resection arthroplasty, with Serratia from prior culture  --start Rocephin 2 grams daily --plan on treatment for 6 weeks --will need weekly cbc, bmp faxed to me at [phone]  --I will fu cultures here and arrange HSFU in my clinic for the patient  NOTE pt had allergy to PCN with rash but not sure of Ceph exposure. He has what sounds like fairly sig reaction to Avelox, Sulfa  #2 STD screening: will check HIV and hep panel   03/19/2014, 3:20 PM   Thank you so much for this interesting consult  [facility] [phone] (pager) [phone] (office) 03/19/2014, 3:20 PM  [facility] 03/19/2014, 3:20 PM

## 2014-03-19 NOTE — Progress Notes (Signed)
Utilization review completed.  

## 2014-03-19 NOTE — Progress Notes (Addendum)
Subjective: 1 Day Post-Op Procedure(s) (LRB): RIGHT KNEE RESECTION ARTHROPLASTY WITH ANTIBIOTIC SPACERS (Right) Patient reports pain as moderate and severe.   Patient seen in rounds with Dr. Lequita Halt. Wife in room at bedside. Attempted to get up last night but had difficulty. Patient is having problems with pain in the knee, requiring pain medications We will start therapy today. Must have on knee immobilizer at all times.  Will get ID consult today for recommendations.  Information at bottom of note with brief history. Plan is to go Home after hospital stay.  Possible at the end of the weekend or first of next week depending upon progress.  Objective: Vital signs in last 24 hours: Temp:  [98 F (36.7 C)-98.9 F (37.2 C)] 98.2 F (36.8 C) (06/05 0539) Pulse Rate:  [64-77] 77 (06/05 0539) Resp:  [14-19] 17 (06/05 0539) BP: (134-163)/(71-101) 142/83 mmHg (06/05 0539) SpO2:  [92 %-100 %] 99 % (06/05 0539) Weight:  [130.636 kg (288 lb)] 130.636 kg (288 lb) (06/05 0451)  Intake/Output from previous day:  Intake/Output Summary (Last 24 hours) at 03/19/14 0839 Last data filed at 03/19/14 0751  Gross per 24 hour  Intake   1120 ml  Output   1570 ml  Net   -450 ml    Intake/Output this shift: Total I/O In: -  Out: 200 [Drains:200]  Labs:  Recent Labs  03/16/14 1535 03/19/14 0435  HGB 11.5* 10.1*    Recent Labs  03/16/14 1535 03/19/14 0435  WBC 8.5 10.4  RBC 4.44 3.98*  HCT 36.4* 32.8*  PLT 271 247    Recent Labs  03/16/14 1535 03/19/14 0435  NA 139 140  K 4.4 4.5  CL 103 105  CO2 26 26  BUN 15 16  CREATININE 1.00 1.03  GLUCOSE 96 165*  CALCIUM 9.2 8.5    Recent Labs  03/16/14 1535  INR 1.14    EXAM General - Patient is Alert, Appropriate and Oriented Extremity - Neurovascular intact Sensation intact distally Dorsiflexion/Plantar flexion intact Dressing - dressing C/D/I Motor Function - intact, moving foot and toes well on exam.  Hemovac left  in place for now.  200 cc in canister this morning on rounds.  Just emptied by staff.    Past Medical History  Diagnosis Date  . Asthma   . GERD (gastroesophageal reflux disease)   . Hypertension   . OSA (obstructive sleep apnea)     "quit wearing mask several years ago" (02/11/2014)  . Rheumatoid arthritis dx'd ~ 1977  . Cellulitis of lower extremity     "usually RLL; this time it's got up to my lower abdoment" (02/11/2014)-series of antibiotics completed 02-28-14  . S/P PICC central line placement 03-16-14    right upper arm remains intact.    Assessment/Plan: 1 Day Post-Op Procedure(s) (LRB): RIGHT KNEE RESECTION ARTHROPLASTY WITH ANTIBIOTIC SPACERS (Right) Principal Problem:   Infection of prosthetic right knee joint  Estimated body mass index is 43.8 kg/(m^2) as calculated from the following:   Height as of this encounter: 5\' 8"  (1.727 m).   Weight as of this encounter: 130.636 kg (288 lb). Up with therapy Continue ABX therapy due to Post-op infection Discharge home with home health  DVT Prophylaxis - Xarelto Weight-Bearing as tolerated to right leg. Knee immobilizer at all times. D/C O2 and Pulse OX and try on Room Air  I.D. Consult Information: 01/03/2011 - Revision Right Total Knee Arthroplasty for failed Right TKA 01/10/2013 [facility] Admission for Right Leg Cellulitis 01/29/2014 -  Office visit for right knee pain (Had a fall in Feb 2015 with hyperextension injury) 02/08/2014 - Bone Scan - Examination is positive for asymmetric uptake in all three phases surrounding the right knee device. Compatible with either aseptic loosening or infection. 02/11/2014 - Hospital Admission for Right Leg Cellulitis and spreading to Abdomen/Panniculitis 02/23/2014 - Knee aspiration Positive for Serratia marcescens.  03/18/2014 - Resection Arthoplasty and Placement of Antibiotic Spacer  Print out of office culture and sens are printed out and placed onto the wall chart.  Avel Peace,  PA-C Orthopaedic Surgery 03/19/2014, 8:39 AM

## 2014-03-19 NOTE — Discharge Instructions (Signed)
Information on my medicine - XARELTO® (Rivaroxaban) ° °This medication education was reviewed with me or my healthcare representative as part of my discharge preparation.  The pharmacist that spoke with me during my hospital stay was:  Dyanna Seiter E Toran Murch, RPH ° °Why was Xarelto® prescribed for you? °Xarelto® was prescribed for you to reduce the risk of blood clots forming after orthopedic surgery. The medical term for these abnormal blood clots is venous thromboembolism (VTE). ° °What do you need to know about xarelto® ? °Take your Xarelto® ONCE DAILY at the same time every day. °You may take it either with or without food. ° °If you have difficulty swallowing the tablet whole, you may crush it and mix in applesauce just prior to taking your dose. ° °Take Xarelto® exactly as prescribed by your doctor and DO NOT stop taking Xarelto® without talking to the doctor who prescribed the medication.  Stopping without other VTE prevention medication to take the place of Xarelto® may increase your risk of developing a clot. ° °After discharge, you should have regular check-up appointments with your healthcare provider that is prescribing your Xarelto®.   ° °What do you do if you miss a dose? °If you miss a dose, take it as soon as you remember on the same day then continue your regularly scheduled once daily regimen the next day. Do not take two doses of Xarelto® on the same day.  ° °Important Safety Information °A possible side effect of Xarelto® is bleeding. You should call your healthcare provider right away if you experience any of the following: °  Bleeding from an injury or your nose that does not stop. °  Unusual colored urine (red or dark brown) or unusual colored stools (red or black). °  Unusual bruising for unknown reasons. °  A serious fall or if you hit your head (even if there is no bleeding). ° °Some medicines may interact with Xarelto® and might increase your risk of bleeding while on Xarelto®. To help avoid  this, consult your healthcare provider or pharmacist prior to using any new prescription or non-prescription medications, including herbals, vitamins, non-steroidal anti-inflammatory drugs (NSAIDs) and supplements. ° °This website has more information on Xarelto®: www.xarelto.com. ° °

## 2014-03-20 LAB — BASIC METABOLIC PANEL
BUN: 18 mg/dL (ref 6–23)
CHLORIDE: 105 meq/L (ref 96–112)
CO2: 26 meq/L (ref 19–32)
CREATININE: 0.91 mg/dL (ref 0.50–1.35)
Calcium: 8.8 mg/dL (ref 8.4–10.5)
GFR calc Af Amer: >90 mL/min (ref >90)
GFR calc non Af Amer: >90 mL/min (ref >90)
Glucose, Bld: 121 mg/dL — ABNORMAL HIGH (ref 70–99)
Potassium: 4.5 mEq/L (ref 3.7–5.3)
SODIUM: 140 meq/L (ref 137–147)

## 2014-03-20 LAB — CBC
HCT: 30.6 % — ABNORMAL LOW (ref 39.0–52.0)
Hemoglobin: 9.4 g/dL — ABNORMAL LOW (ref 13.0–17.0)
MCH: 25.5 pg — ABNORMAL LOW (ref 26.0–34.0)
MCHC: 30.7 g/dL (ref 30.0–36.0)
MCV: 83.2 fL (ref 78.0–100.0)
PLATELETS: 270 10*3/uL (ref 150–400)
RBC: 3.68 MIL/uL — ABNORMAL LOW (ref 4.22–5.81)
RDW: 16.7 % — AB (ref 11.5–15.5)
WBC: 13.4 10*3/uL — AB (ref 4.0–10.5)

## 2014-03-20 LAB — SEDIMENTATION RATE: SED RATE: 65 mm/h — AB (ref 0–16)

## 2014-03-20 LAB — HEPATITIS PANEL, ACUTE
HCV Ab: NEGATIVE
HEP B C IGM: NONREACTIVE
HEP B S AG: NEGATIVE
Hep A IgM: NONREACTIVE

## 2014-03-20 LAB — C-REACTIVE PROTEIN: CRP: 6.6 mg/dL — ABNORMAL HIGH (ref <0.60)

## 2014-03-20 NOTE — Progress Notes (Signed)
Subjective: 2 Days Post-Op Procedure(s) (LRB): RIGHT KNEE RESECTION ARTHROPLASTY WITH ANTIBIOTIC SPACERS (Right) Patient reports pain as mild.  Right knee soreness. Tolerating PO's well. Denies SOB,CP,Calf pain. No v/n/f/c.  Objective: Vital signs in last 24 hours: Temp:  [97.5 F (36.4 C)-98.3 F (36.8 C)] 97.5 F (36.4 C) (06/06 0529) Pulse Rate:  [62-81] 62 (06/06 0529) Resp:  [16-18] 18 (06/06 0529) BP: (127-147)/(79-84) 131/83 mmHg (06/06 0529) SpO2:  [96 %-99 %] 98 % (06/06 0529)  Intake/Output from previous day: 06/05 0701 - 06/06 0700 In: 2217.3 [P.O.:720; I.V.:1447.3; IV Piggyback:50] Out: 1455 [Urine:950; Drains:505] Intake/Output this shift: Total I/O In: -  Out: 450 [Urine:450]   Recent Labs  03/19/14 0435 03/20/14 0600  HGB 10.1* 9.4*    Recent Labs  03/19/14 0435 03/20/14 0600  WBC 10.4 13.4*  RBC 3.98* 3.68*  HCT 32.8* 30.6*  PLT 247 270    Recent Labs  03/19/14 0435 03/20/14 0600  NA 140 140  K 4.5 4.5  CL 105 105  CO2 26 26  BUN 16 18  CREATININE 1.03 0.91  GLUCOSE 165* 121*  CALCIUM 8.5 8.8   No results found for this basename: LABPT, INR,  in the last 72 hours  Well nourished. Alert and oriented x3. RRR, Lungs clear, BS x4. Abdomen soft and non tender. Right Calf soft and non tender. Right knee dressing C/D/I. No DVT signs. Compartment soft. No signs of infection.  Right LE neurovascular intact.  Right Drain D/c'ed. Both tips were intact. Pt tolerated well. Dressing changed. Pressure held until hemostasis was obtained.  Assessment/Plan: 2 Days Post-Op Procedure(s) (LRB): RIGHT KNEE RESECTION ARTHROPLASTY WITH ANTIBIOTIC SPACERS (Right) Up with PT Drain D/C'ed Dressing changed , will change again again tom. ID following for ABX management Plan D/c SUn or Mon.   Bryson L Stilwell 03/20/2014, 8:55 AM

## 2014-03-20 NOTE — Progress Notes (Signed)
CARE MANAGEMENT NOTE 03/20/2014  Patient:  [name], [name]   Account Number:  [id number]  Date Initiated:  03/20/2014  Documentation initiated by:  [facility]  Subjective/Objective Assessment:   RIGHT KNEE RESECTION ARTHROPLASTY WITH ANTIBIOTIC SPACERS     Action/Plan:   HH   Anticipated DC Date:  03/22/2014   Anticipated DC Plan:  HOME W HOME HEALTH SERVICES      DC Planning Services  CM consult      [facility] Choice  HOME HEALTH   Choice offered to / List presented to:  C-1 Patient        HH arranged  HH-1 RN  IV Antibiotics      HH agency  OTHER - SEE NOTE   Status of service:   Medicare Important Message given?   (If response is "NO", the following Medicare IM given date fields will be blank) Date Medicare IM given:   Date Additional Medicare IM given:    Discharge Disposition:  HOME W HOME HEALTH SERVICES  Per UR Regulation:    If discussed at Long Length of Stay Meetings, dates discussed:    Comments:  03/20/2014 1455 NCM spoke to pt and gave permission to speak to wife. Wife states [facility] is coming out for IV abx at home. # [phone] fax [phone]. Contacted [facility] and spoke to rep. States they will need Resumption of care orders and Rx for IV abx med. Will arrange [facility] PT with agency. Waiting final orders and will fax to agency. No DME needed for home. Pt states he has RW and 3n1 at home.  Isidoro Donning RN CCM Case Mgmt phone [phone]

## 2014-03-20 NOTE — Evaluation (Signed)
Physical Therapy Evaluation Patient Details Name: [name] MRN: [id number] DOB: April 17, 1961 Today's Date: 03/20/2014   History of Present Illness  Pt is a 53 year old male s/p R knee arthoplasty resection with antibiotic spacer.  Clinical Impression  Pt progressing; still painful with amb but incr distance this am;     Follow Up Recommendations No PT follow up    Equipment Recommendations  None recommended by PT    Recommendations for Other Services       Precautions / Restrictions Precautions Precautions: Knee Required Braces or Orthoses: Knee Immobilizer - Right Knee Immobilizer - Right: On at all times Restrictions Other Position/Activity Restrictions: WBAT      Mobility  Bed Mobility Overal bed mobility: Needs Assistance Bed Mobility: Supine to Sit              Transfers Overall transfer level: Needs assistance Equipment used: Rolling walker (2 wheeled) Transfers: Sit to/from Stand Sit to Stand: Min guard         General transfer comment: verbal cues for safe technique, including LE and UE placement  Ambulation/Gait Ambulation/Gait assistance: Min guard Ambulation Distance (Feet): 55 Feet Assistive device: Rolling walker (2 wheeled) Gait Pattern/deviations: Step-to pattern;Antalgic;Decreased stance time - right     General Gait Details: verbal cues for sequence and use of UEs through RW to assist with decreasing WBing on R LE for pain control  Stairs            Wheelchair Mobility    Modified Rankin (Stroke Patients Only)       Balance                                             Pertinent Vitals/Pain Pain 3/10    Home Living                        Prior Function                 Hand Dominance        Extremity/Trunk Assessment                         Communication      Cognition Arousal/Alertness: Awake/alert Behavior During Therapy: WFL for tasks  assessed/performed Overall Cognitive Status: Within Functional Limits for tasks assessed                      General Comments      Exercises        Assessment/Plan    PT Assessment    PT Diagnosis     PT Problem List    PT Treatment Interventions     PT Goals (Current goals can be found in the Care Plan section) Acute Rehab PT Goals Patient Stated Goal: walk more Time For Goal Achievement: 03/24/14 Potential to Achieve Goals: Good    Frequency 7X/week   Barriers to discharge        Co-evaluation               End of Session Equipment Utilized During Treatment: Right knee immobilizer Activity Tolerance: Patient tolerated treatment well Patient left: in chair;with call bell/phone within reach;with family/visitor present Nurse Communication: Mobility status         Time: 1220-1240 PT Time Calculation (min): 20 min  Charges:     PT Treatments $Gait Training: 8-22 mins   PT G Codes:          Caswell Corwin 03/20/2014, 1:15 PM

## 2014-03-21 LAB — BODY FLUID CULTURE: Culture: NO GROWTH

## 2014-03-21 LAB — CBC
HCT: 29.9 % — ABNORMAL LOW (ref 39.0–52.0)
HEMOGLOBIN: 9.4 g/dL — AB (ref 13.0–17.0)
MCH: 26 pg (ref 26.0–34.0)
MCHC: 31.4 g/dL (ref 30.0–36.0)
MCV: 82.6 fL (ref 78.0–100.0)
Platelets: 277 10*3/uL (ref 150–400)
RBC: 3.62 MIL/uL — ABNORMAL LOW (ref 4.22–5.81)
RDW: 16.7 % — ABNORMAL HIGH (ref 11.5–15.5)
WBC: 10.8 10*3/uL — AB (ref 4.0–10.5)

## 2014-03-21 MED ORDER — POLYETHYLENE GLYCOL 3350 17 G PO PACK: 17.0000 g | PACK | Freq: Every day | ORAL | Status: DC | PRN

## 2014-03-21 MED ORDER — OXYCODONE HCL 5 MG PO TABS: 5.0000 mg | ORAL_TABLET | ORAL | Status: DC | PRN

## 2014-03-21 MED ORDER — DSS 100 MG PO CAPS: 100.0000 mg | ORAL_CAPSULE | Freq: Two times a day (BID) | ORAL | Status: DC

## 2014-03-21 MED ORDER — DEXTROSE 5 % IV SOLN: 2.0000 g | INTRAVENOUS | Status: DC

## 2014-03-21 MED ORDER — METHOCARBAMOL 500 MG PO TABS: 500.0000 mg | ORAL_TABLET | Freq: Four times a day (QID) | ORAL | Status: DC | PRN

## 2014-03-21 MED ORDER — SODIUM CHLORIDE 0.9 % IJ SOLN
10.0000 mL | INTRAMUSCULAR | Status: DC | PRN
  Administered 2014-03-21 (×2): 10 mL

## 2014-03-21 MED ORDER — RIVAROXABAN 10 MG PO TABS: 10.0000 mg | ORAL_TABLET | Freq: Every day | ORAL | Status: DC

## 2014-03-21 MED ORDER — HEPARIN SOD (PORK) LOCK FLUSH 100 UNIT/ML IV SOLN
250.0000 [IU] | INTRAVENOUS | Status: AC | PRN
  Administered 2014-03-21: 250 [IU]

## 2014-03-21 NOTE — Plan of Care (Signed)
Problem: Discharge Progression Outcomes Goal: Barriers To Progression Addressed/Resolved Outcome: Completed/Met Date Met:  03/21/14 Home health arranged per pt's prefferrence. [employer]

## 2014-03-21 NOTE — Progress Notes (Signed)
   Subjective: 3 Days Post-Op Procedure(s) (LRB): RIGHT KNEE RESECTION ARTHROPLASTY WITH ANTIBIOTIC SPACERS (Right)   Patient reports pain as mild, pain controlled. No events throughout the night. Discussed the labs, results and ID suggestions. Ready to be discharged home after receiving his dose of antibiotics today.  Objective:   VITALS:   Filed Vitals:   03/21/14 0500  BP: 162/84  Pulse: 65  Temp: 97.4 F (36.3 C)  Resp: 20    Neurovascular intact Dorsiflexion/Plantar flexion intact Incision: dressing C/D/I Compartment soft  LABS  Recent Labs  03/19/14 0435 03/20/14 0600 03/21/14 0545  HGB 10.1* 9.4* 9.4*  HCT 32.8* 30.6* 29.9*  WBC 10.4 13.4* 10.8*  PLT 247 270 277     Recent Labs  03/19/14 0435 03/20/14 0600  NA 140 140  K 4.5 4.5  BUN 16 18  CREATININE 1.03 0.91  GLUCOSE 165* 121*     Assessment/Plan: 3 Days Post-Op Procedure(s) (LRB): RIGHT KNEE RESECTION ARTHROPLASTY WITH ANTIBIOTIC SPACERS (Right) Discussed with his nurse Ladona Ridgel, who has a number for the infusion group when he leaves. Dressing changed Up with therapy Discharge home with home health Follow up in 2 weeks at [facility] Follow up with Dr. Lequita Halt in 2 weeks.  Contact information:  [facility] [address], [address] [place] [address] [phone]    ID Recommendations (Dr. Daiva Eves) --Rocephin 2 grams daily  --plan on treatment for 6 weeks  --will need weekly cbc, bmp faxed to Dr. Daiva Eves at [phone]  --Dr. Daiva Eves will fu cultures here and arrange HSFU in my clinic for the patient        Anastasio Auerbach. Kalsey Lull   PAC  03/21/2014, 8:56 AM

## 2014-03-21 NOTE — Progress Notes (Signed)
Discharged from floor via w/c, wife & son with pt. No changes in assessment. [employer]

## 2014-03-21 NOTE — Progress Notes (Signed)
03/21/2014 1230 NCM faxed dc summary, IV abx Rx and facesheet to [facility]. Made the aware of dc home today with scheduled dose needed on 03/22/2014.  Isidoro Donning RN CCM Case Mgmt phone [phone]

## 2014-03-21 NOTE — Progress Notes (Signed)
Physical Therapy Treatment Patient Details Name: [name] MRN: [id number] DOB: [birth year] Today's Date: 03/21/2014    History of Present Illness Pt is a 53 year old male s/p R knee arthoplasty resection with antibiotic spacer.    PT Comments    Pt progressing; able to do stairs with incr time; will have support of wife and son (son is a Company secretary) and feels he will be able to get up steps with their support at home; he does have one rail but rail and crutch technique too painful with incr wt on RLE;    Follow Up Recommendations  No PT follow up     Equipment Recommendations  None recommended by PT    Recommendations for Other Services       Precautions / Restrictions Precautions Precautions: Knee Required Braces or Orthoses: Knee Immobilizer - Right Knee Immobilizer - Right: On at all times Restrictions Weight Bearing Restrictions: No Other Position/Activity Restrictions: WBAT    Mobility  Bed Mobility Overal bed mobility: Needs Assistance Bed Mobility: Supine to Sit     Supine to sit: Min assist;HOB elevated     General bed mobility comments: verbal cues for technique, assist for R LE  Transfers Overall transfer level: Needs assistance Equipment used: Rolling walker (2 wheeled) Transfers: Sit to/from Stand Sit to Stand: Min guard         General transfer comment: verbal cues for safe technique, including LE and UE placement  Ambulation/Gait Ambulation/Gait assistance: Min guard Ambulation Distance (Feet): 45 Feet Assistive device: Rolling walker (2 wheeled) Gait Pattern/deviations: Step-to pattern;Antalgic;Decreased stance time - right Gait velocity: decr   General Gait Details: verbal cues for sequence and use of UEs through RW to assist with decreasing WBing on R LE for pain control   Stairs Stairs: Yes Stairs assistance: Min assist Stair Management: Backwards;With walker Number of Stairs: 2 General stair comments: cues for sequence, support  for balance, R knee, and walker  Wheelchair Mobility    Modified Rankin (Stroke Patients Only)       Balance                                    Cognition Arousal/Alertness: Awake/alert Behavior During Therapy: WFL for tasks assessed/performed Overall Cognitive Status: Within Functional Limits for tasks assessed                      Exercises      General Comments        Pertinent Vitals/Pain 3-4/10 right knee    Home Living                      Prior Function            PT Goals (current goals can now be found in the care plan section) Acute Rehab PT Goals Patient Stated Goal: walk more Time For Goal Achievement: 03/24/14 Potential to Achieve Goals: Good Progress towards PT goals: Progressing toward goals    Frequency  7X/week    PT Plan Current plan remains appropriate    Co-evaluation             End of Session Equipment Utilized During Treatment: Right knee immobilizer Activity Tolerance: Patient tolerated treatment well;Patient limited by fatigue Patient left: in chair;with call bell/phone within reach     Time: 0941-1006 PT Time Calculation (min): 25 min  Charges:  $Gait  Training: 23-37 mins                    G Codes:      Caswell Corwin Mar 26, 2014, 10:22 AM

## 2014-03-22 LAB — HIV-1 RNA QUANT-NO REFLEX-BLD: HIV 1 RNA Quant: <20 copies/mL (ref <20)

## 2014-03-22 NOTE — Progress Notes (Signed)
CARE MANAGEMENT NOTE 03/22/2014  Patient:  [name], [name]   Account Number:  [id number]  Date Initiated:  03/20/2014  Documentation initiated by:  [facility]  Subjective/Objective Assessment:   RIGHT KNEE RESECTION ARTHROPLASTY WITH ANTIBIOTIC SPACERS     Action/Plan:   HH   Anticipated DC Date:  03/22/2014   Anticipated DC Plan:  HOME W HOME HEALTH SERVICES      DC Planning Services  CM consult      [facility] Choice  HOME HEALTH   Choice offered to / List presented to:  C-1 Patient        HH arranged  HH-1 RN  IV Antibiotics      HH agency  OTHER - SEE NOTE   Status of service:  Completed, signed off Medicare Important Message given?   (If response is "NO", the following Medicare IM given date fields will be blank) Date Medicare IM given:   Date Additional Medicare IM given:    Discharge Disposition:  HOME W HOME HEALTH SERVICES  Per UR Regulation:    If discussed at Long Length of Stay Meetings, dates discussed:    Comments:  03/22/2014 0930 NCM spoke to [facility] and they received fax. They will arrange for [facility] visit today for IV abx. Isidoro Donning RN CCM Case Mgmt phone [phone]  03/21/2014 1230 NCM faxed dc summary, IV abx Rx and facesheet to [facility]. Made the aware of dc home today with scheduled dose needed on 03/22/2014.  Isidoro Donning RN CCM Case Mgmt phone [phone]  03/20/2014 1455 NCM spoke to pt and gave permission to speak to wife. Wife states [facility] is coming out for IV abx at home. # [phone] fax [phone]. Contacted [facility] and spoke to rep. States they will need Resumption of care orders and Rx for IV abx med. Will arrange [facility] PT with agency. Waiting final orders and will fax to agency. No DME needed for home. Pt states he has RW and 3n1 at home.  Isidoro Donning RN CCM Case Mgmt phone [phone]

## 2014-03-23 LAB — ANAEROBIC CULTURE

## 2014-03-23 NOTE — Discharge Summary (Signed)
Physician Discharge Summary  Patient ID: [name] MRN: [id number] DOB/AGE: 53-Jun-1962 53 y.o.  Admit date: 03/18/2014 Discharge date: 03/21/2014   Procedures:  Procedure(s) (LRB): RIGHT KNEE RESECTION ARTHROPLASTY WITH ANTIBIOTIC SPACERS (Right)  Attending Physician:  Dr. Durene Romans   Admission Diagnoses:   Infected Right Total Knee  Discharge Diagnoses:  Principal Problem:   Infection of prosthetic right knee joint  Past Medical History  Diagnosis Date  . Asthma   . GERD (gastroesophageal reflux disease)   . Hypertension   . OSA (obstructive sleep apnea)     "quit wearing mask several years ago" (02/11/2014)  . Rheumatoid arthritis dx'd ~ 1977  . Cellulitis of lower extremity     "usually RLL; this time it's got up to my lower abdoment" (02/11/2014)-series of antibiotics completed 02-28-14  . S/P PICC central line placement 03-16-14    right upper arm remains intact.    HPI: The patient is a 53 year old male who comes in for a preoperative History and Physical. The patient is scheduled for a right resecrion of right total knee to be performed by Dr. Gus Rankin. Aluisio, MD at [facility] on 03-18-2014.  The patient is a 53 year old male presenting for follow up and H&P. The patient comes in 3 years out from right total knee arthroplasty (revision). The patient states that he is not doing well at this time. The pain is under fair control at this time and describe their pain as mild to moderate. They are currently on no medication for their pain. He was hospitalized April 30th and discharged May 4th. He was put in the hospital for cellulitis. He has been on IV Vancomycin and Clindamycin since he was discharged. We review his bone scan and the lab resuts. 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 He is going to require resection arthroplasty.  Unfortunately he is having a lot of difficulty with the right leg. The knee itself is really not hurting. He has had multiple  bouts of cellulitis with a lot of swelling in the leg. He has not had any issues specifically with the knee other than some swelling. They have failed non-operative management including IV antibiotics for the cellulitis. It is felt that they would benefit from undergoing resection of the joint replacement. Risks and benefits of the procedure have been discussed with the patient and they elect to proceed with surgery. There are no active contraindications to surgery such as rapidly progressive neurological disease.  PCP: Garlan Fillers, MD   Discharged Condition: good  Hospital Course:  Patient underwent the above stated procedure on 03/18/2014. Patient tolerated the procedure well and brought to the recovery room in good condition and subsequently to the floor.  POD #1 BP: 142/83 ; Pulse: 77 ; Temp: 98.2 F (36.8 C) ; Resp: 17 Patient reports pain as moderate and severe.  Attempted to get up last night but had difficulty. Patient is having problems with pain in the knee, requiring pain medications. Will start therapy today. Must have on knee immobilizer at all times. ID consulted.   Patient is Alert, Appropriate and Oriented ; Neurovascular intact, sensation intact distally, dorsiflexion/plantar flexion intact ; Dressing C/D/I ; Motor function intact, moving foot and toes well on exam. Hemovac left in place for now. 200 cc in canister this morning on rounds. Just emptied by staff.   LABS  Basename    HGB  10.1  HCT  32.8   POD #  2  BP: 131/83 ; Pulse: 62 ; Temp: 97.5 F (36.4 C) ; Resp: 18 Patient reports pain as mild. Right knee soreness. Tolerating PO's well. Denies SOB,CP,Calf pain. No v/n/f/c. Well nourished. Alert and oriented x3. RRR, Lungs clear, BS x4. Abdomen soft and non tender. Right Calf soft and non tender. Right knee dressing C/D/I. No DVT signs. Compartment soft. No signs of infection. Right LE neurovascular intact.   Right drains D/c'ed, tips were intact. Pt tolerated well.  Dressing changed. Pressure held until hemostasis was obtained.  LABS  Basename    HGB  9.4  HCT  30.6   POD #3  BP: 162/84 ; Pulse: 65 ; Temp: 97.4 F (36.3 C) ; Resp: 20 Patient reports pain as mild, pain controlled. No events throughout the night. Discussed the labs, results and ID suggestions. Nurse has been given a number to call home health when he is discharged to start antibiotics. Ready to be discharged home after receiving his dose of antibiotics today.  LABS  Basename    HGB  9.4  HCT  29.9   Discharge Exam: General appearance: alert, cooperative and no distress Extremities: Homans sign is negative, no sign of DVT and no edema, redness or tenderness in the calves or thighs  Disposition: Home with follow up in 2 weeks   Follow-up Information   Follow up with Loanne Drilling, MD. Schedule an appointment as soon as possible for a visit in 2 weeks.   Specialty:  Orthopedic Surgery   Contact information:   [address] [place] [address] [phone]       Call Acey Lav, MD.   Specialty:  Infectious Diseases   Contact information:   [address] [place] [address] [phone]       Follow up with [facility]. [facility] RN and Physical Therapy, IV antibiotics)    Contact information:   [phone]      Discharge Instructions   Call MD / Call 911    Complete by:  As directed   If you experience chest pain or shortness of breath, CALL 911 and be transported to the hospital emergency room.  If you develope a fever above 101 F, pus (white drainage) or increased drainage or redness at the wound, or calf pain, call your surgeon's office.     Change dressing    Complete by:  As directed   Daily dressing changes with 4x4 gauze and tape..     Constipation Prevention    Complete by:  As directed   Drink plenty of fluids.  Prune juice may be helpful.  You may use a stool softener, such as Colace  (over the counter) 100 mg twice a day.  Use MiraLax (over the counter) for constipation as needed.     Diet - low sodium heart healthy    Complete by:  As directed      Discharge instructions    Complete by:  As directed   Daily dressing changes with 4x4 gauze and tape. Follow up in 2 weeks at [facility]. Call with any questions or concerns.     Increase activity slowly as tolerated    Complete by:  As directed      TED hose    Complete by:  As directed   Use stockings (TED hose) for 2 weeks on both leg(s).  You may remove them at night for sleeping.     Weight bearing  as tolerated    Complete by:  As directed   Laterality:  right  Extremity:  Lower             Medication List    STOP taking these medications       hydroxychloroquine 200 MG tablet  Commonly known as:  PLAQUENIL     ibuprofen 200 MG tablet  Commonly known as:  ADVIL,MOTRIN      TAKE these medications       acetaminophen 325 MG tablet  Commonly known as:  TYLENOL  Take 2 tablets (650 mg total) by mouth every 6 (six) hours as needed for mild pain (or Fever >/= 101).     ADVAIR DISKUS 250-50 MCG/DOSE Aepb  Generic drug:  Fluticasone-Salmeterol  Inhale 1 puff into the lungs daily as needed (shortness of breath).     cefTRIAXone 2 g in dextrose 5 % 50 mL  Inject 2 g into the vein daily.     clotrimazole 1 % cream  Commonly known as:  LOTRIMIN  Apply 1 application topically daily as needed (athletes foot).     diltiazem 240 MG 24 hr capsule  Commonly known as:  CARDIZEM CD  Take 240 mg by mouth every morning.     DSS 100 MG Caps  Take 100 mg by mouth 2 (two) times daily.     furosemide 40 MG tablet  Commonly known as:  LASIX  Take 40 mg by mouth every morning.     losartan 100 MG tablet  Commonly known as:  COZAAR  Take 100 mg by mouth every morning.     methocarbamol 500 MG tablet  Commonly known as:  ROBAXIN  Take 1 tablet (500 mg total) by mouth every 6 (six) hours as needed  for muscle spasms.     metoprolol succinate 50 MG 24 hr tablet  Commonly known as:  TOPROL-XL  Take 50 mg by mouth every morning.     oxyCODONE 5 MG immediate release tablet  Commonly known as:  Oxy IR/ROXICODONE  Take 1-2 tablets (5-10 mg total) by mouth every 3 (three) hours as needed for breakthrough pain.     polyethylene glycol packet  Commonly known as:  MIRALAX / GLYCOLAX  Take 17 g by mouth daily as needed for mild constipation.     predniSONE 5 MG tablet  Commonly known as:  DELTASONE  Take 5 mg by mouth daily with breakfast.     RABEprazole 20 MG tablet  Commonly known as:  ACIPHEX  Take 20 mg by mouth daily.     rivaroxaban 10 MG Tabs tablet  Commonly known as:  XARELTO  Take 1 tablet (10 mg total) by mouth daily with breakfast.         Signed: Anastasio Auerbach. Jakyrie Totherow   PA-C  03/23/2014, 4:10 PM

## 2014-04-05 ENCOUNTER — Telehealth: Payer: Self-pay | Admitting: *Deleted

## 2014-04-05 ENCOUNTER — Encounter: Payer: Self-pay | Admitting: Internal Medicine

## 2014-04-05 ENCOUNTER — Ambulatory Visit (INDEPENDENT_AMBULATORY_CARE_PROVIDER_SITE_OTHER): Payer: 59 | Admitting: Internal Medicine

## 2014-04-05 VITALS — BP 113/75 | HR 84 | Temp 98.1°F | Wt 290.0 lb

## 2014-04-05 DIAGNOSIS — T8450XD Infection and inflammatory reaction due to unspecified internal joint prosthesis, subsequent encounter: Secondary | ICD-10-CM

## 2014-04-05 DIAGNOSIS — Z5189 Encounter for other specified aftercare: Secondary | ICD-10-CM

## 2014-04-05 DIAGNOSIS — T8450XA Infection and inflammatory reaction due to unspecified internal joint prosthesis, initial encounter: Secondary | ICD-10-CM

## 2014-04-05 NOTE — Telephone Encounter (Signed)
Verbal order per Dr. Orvan Falconer  given to Arline Asp, RN at [facility] [phone] to add a sed rate and CRP to patient's next lab draw and to discontinue ceftriaxone on 04/30/14. Picc line to stay in place until surgery on 05/21/14 and home health to maintain picc line. Infusion company to run patient's insurance to see if picc line care is covered.  [name]

## 2014-04-05 NOTE — Progress Notes (Signed)
Patient ID: [name], male   DOB: 03-Oct-1961, 53 y.o.   MRN: [id number]          Patient Active Problem List   Diagnosis Date Noted  . Infection of prosthetic right knee joint 03/18/2014    Priority: High  . Cellulitis and abscess of trunk 02/15/2014    Class: Acute  . Anemia 02/15/2014    Class: Chronic  . Cellulitis 02/11/2014  . Cellulitis of leg, right 01/13/2013    Class: Acute  . Rheumatoid arthritis 01/13/2013    Class: Chronic  . Obstructive sleep apnea 01/13/2013    Class: Chronic  . Chronic venous insufficiency 01/13/2013    Class: Chronic    Patient's Medications  New Prescriptions   No medications on file  Previous Medications   ACETAMINOPHEN (TYLENOL) 325 MG TABLET    Take 2 tablets (650 mg total) by mouth every 6 (six) hours as needed for mild pain (or Fever >/= 101).   ADVAIR DISKUS 250-50 MCG/DOSE AEPB    Inhale 1 puff into the lungs daily as needed (shortness of breath).    CEFTRIAXONE 2 G IN DEXTROSE 5 % 50 ML    Inject 2 g into the vein daily.   CLOTRIMAZOLE (LOTRIMIN) 1 % CREAM    Apply 1 application topically daily as needed (athletes foot).   DILTIAZEM (CARDIZEM CD) 240 MG 24 HR CAPSULE    Take 240 mg by mouth every morning.    DOCUSATE SODIUM 100 MG CAPS    Take 100 mg by mouth 2 (two) times daily.   FUROSEMIDE (LASIX) 40 MG TABLET    Take 40 mg by mouth every morning.   LOSARTAN (COZAAR) 100 MG TABLET    Take 100 mg by mouth every morning.    METHOCARBAMOL (ROBAXIN) 500 MG TABLET    Take 1 tablet (500 mg total) by mouth every 6 (six) hours as needed for muscle spasms.   METOPROLOL (TOPROL-XL) 50 MG 24 HR TABLET    Take 50 mg by mouth every morning.    OXYCODONE (OXY IR/ROXICODONE) 5 MG IMMEDIATE RELEASE TABLET    Take 1-2 tablets (5-10 mg total) by mouth every 3 (three) hours as needed for breakthrough pain.   POLYETHYLENE GLYCOL (MIRALAX / GLYCOLAX) PACKET    Take 17 g by mouth daily as needed for mild constipation.   PREDNISONE (DELTASONE) 5 MG  TABLET    Take 5 mg by mouth daily with breakfast.    RABEPRAZOLE (ACIPHEX) 20 MG TABLET    Take 20 mg by mouth daily.   RIVAROXABAN (XARELTO) 10 MG TABS TABLET    Take 1 tablet (10 mg total) by mouth daily with breakfast.  Modified Medications   No medications on file  Discontinued Medications   No medications on file    Subjective: [name] is in with his wife for his hospital followup visit. 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 He was discharged on ceftriaxone and is now completed 17 days of therapy. He is feeling better. He's had no problems tolerating his PICC or ceftriaxone. He has been off of Humira for his rheumatoid arthritis but is taking prednisone 5 mg daily. He is scheduled for revision arthroplasty on August 7. Review of Systems: Pertinent items are noted in HPI.  Past Medical History  Diagnosis Date  . Asthma   . GERD (gastroesophageal reflux disease)   . Hypertension   . OSA (obstructive sleep apnea)     "  quit wearing mask several years ago" (02/11/2014)  . Rheumatoid arthritis dx'd ~ 1977  . Cellulitis of lower extremity     "usually RLL; this time it's got up to my lower abdoment" (02/11/2014)-series of antibiotics completed 02-28-14  . S/P PICC central line placement 03-16-14    right upper arm remains intact.    History  Substance Use Topics  . Smoking status: Former Smoker -- 1.00 packs/day for 3 years    Types: Cigarettes    Quit date: 03/16/1981  . Smokeless tobacco: Current User    Types: Snuff     Comment: "quit smoking cigarettes in the 1980's"  . Alcohol Use: No    Family History  Problem Relation Age of Onset  . Colon cancer Mother     Allergies  Allergen Reactions  . Avelox [Moxifloxacin Hcl In Nacl] Hives, Itching and Other (See Comments)    redness  . Sulfa Antibiotics Itching  . Erythrocin Rash  . Penicillins Rash    Objective: Temp: 98.1 F (36.7 C) (06/22  1436) Temp src: Oral (06/22 1436) BP: 113/75 mmHg (06/22 1436) Pulse Rate: 84 (06/22 1436) Body mass index is 44.1 kg/(m^2).  General: He is in no distress Skin: Right arm PICC site appears normal Joints and extremities: Right knee incision healing nicely. Steri-Strips in place  Lab Results Lab Results  Component Value Date   WBC 10.8* 03/21/2014   HGB 9.4* 03/21/2014   HCT 29.9* 03/21/2014   MCV 82.6 03/21/2014   PLT 277 03/21/2014    Lab Results  Component Value Date   CREATININE 0.91 03/20/2014   BUN 18 03/20/2014   NA 140 03/20/2014   K 4.5 03/20/2014   CL 105 03/20/2014   CO2 26 03/20/2014    Lab Results  Component Value Date   ALT 11 03/16/2014   AST 15 03/16/2014   ALKPHOS 63 03/16/2014   BILITOT 0.3 03/16/2014    Sed Rate (mm/hr)  Date Value  03/20/2014 65*  02/11/2014 91*     CRP (mg/dL)  Date Value  0/0/7622 6.6*  02/11/2014 17.1*    Assessment: He appears to be improving on therapy for prosthetic joint infection.  Plan: 1. Continue ceftriaxone through July 17 2. Repeat sedimentation rate and C-reactive protein 3. Follow up here on July 14   Cliffton Asters, MD [facility] [phone] pager   [phone] cell 04/05/2014, 3:21 PM

## 2014-04-26 ENCOUNTER — Ambulatory Visit (INDEPENDENT_AMBULATORY_CARE_PROVIDER_SITE_OTHER): Payer: 59 | Admitting: Internal Medicine

## 2014-04-26 ENCOUNTER — Encounter: Payer: Self-pay | Admitting: Internal Medicine

## 2014-04-26 VITALS — BP 135/88 | HR 71 | Temp 98.2°F

## 2014-04-26 DIAGNOSIS — T8450XA Infection and inflammatory reaction due to unspecified internal joint prosthesis, initial encounter: Secondary | ICD-10-CM

## 2014-04-26 DIAGNOSIS — Z5189 Encounter for other specified aftercare: Secondary | ICD-10-CM

## 2014-04-26 DIAGNOSIS — T8450XD Infection and inflammatory reaction due to unspecified internal joint prosthesis, subsequent encounter: Secondary | ICD-10-CM

## 2014-04-26 MED ORDER — DEXTROSE 5 % IV SOLN: 2.0000 g | INTRAVENOUS | Status: AC

## 2014-04-26 NOTE — Progress Notes (Signed)
Patient ID: [name], male   DOB: February 05, 1961, 53 y.o.   MRN: [id number]         [facility] for Infectious Disease  Patient Active Problem List   Diagnosis Date Noted  . Infection of prosthetic right knee joint 03/18/2014    Priority: High  . Cellulitis and abscess of trunk 02/15/2014    Class: Acute  . Anemia 02/15/2014    Class: Chronic  . Cellulitis 02/11/2014  . Cellulitis of leg, right 01/13/2013    Class: Acute  . Rheumatoid arthritis 01/13/2013    Class: Chronic  . Obstructive sleep apnea 01/13/2013    Class: Chronic  . Chronic venous insufficiency 01/13/2013    Class: Chronic    Patient's Medications  New Prescriptions   No medications on file  Previous Medications   ACETAMINOPHEN (TYLENOL) 325 MG TABLET    Take 2 tablets (650 mg total) by mouth every 6 (six) hours as needed for mild pain (or Fever >/= 101).   ADVAIR DISKUS 250-50 MCG/DOSE AEPB    Inhale 1 puff into the lungs daily as needed (shortness of breath).    CLOTRIMAZOLE (LOTRIMIN) 1 % CREAM    Apply 1 application topically daily as needed (athletes foot).   DILTIAZEM (CARDIZEM CD) 240 MG 24 HR CAPSULE    Take 240 mg by mouth every morning.    DOCUSATE SODIUM 100 MG CAPS    Take 100 mg by mouth 2 (two) times daily.   FUROSEMIDE (LASIX) 40 MG TABLET    Take 40 mg by mouth every morning.   LOSARTAN (COZAAR) 100 MG TABLET    Take 100 mg by mouth every morning.    METHOCARBAMOL (ROBAXIN) 500 MG TABLET    Take 1 tablet (500 mg total) by mouth every 6 (six) hours as needed for muscle spasms.   METOPROLOL (TOPROL-XL) 50 MG 24 HR TABLET    Take 50 mg by mouth every morning.    OXYCODONE (OXY IR/ROXICODONE) 5 MG IMMEDIATE RELEASE TABLET    Take 1-2 tablets (5-10 mg total) by mouth every 3 (three) hours as needed for breakthrough pain.   POLYETHYLENE GLYCOL (MIRALAX / GLYCOLAX) PACKET    Take 17 g by mouth daily as needed for mild constipation.   PREDNISONE (DELTASONE) 5 MG TABLET    Take 5 mg by mouth daily  with breakfast.    RABEPRAZOLE (ACIPHEX) 20 MG TABLET    Take 20 mg by mouth daily.   RIVAROXABAN (XARELTO) 10 MG TABS TABLET    Take 1 tablet (10 mg total) by mouth daily with breakfast.  Modified Medications   Modified Medication Previous Medication   CEFTRIAXONE 2 G IN DEXTROSE 5 % 50 ML cefTRIAXone 2 g in dextrose 5 % 50 mL      Inject 2 g into the vein daily.    Inject 2 g into the vein daily.  Discontinued Medications   No medications on file    Subjective: [name] is in for his routine followup visit. He is now completed 38 days of IV ceftriaxone for his Serratia right prosthetic knee infection following resection arthroplasty and spacer placement. Has not had any problems with his PICC and he has tolerated ceftriaxone well.  Review of Systems: Pertinent items are noted in HPI.  Past Medical History  Diagnosis Date  . Asthma   . GERD (gastroesophageal reflux disease)   . Hypertension   . OSA (obstructive sleep apnea)     "quit wearing mask several  years ago" (02/11/2014)  . Rheumatoid arthritis dx'd ~ 1977  . Cellulitis of lower extremity     "usually RLL; this time it's got up to my lower abdoment" (02/11/2014)-series of antibiotics completed 02-28-14  . S/P PICC central line placement 03-16-14    right upper arm remains intact.    History  Substance Use Topics  . Smoking status: Former Smoker -- 1.00 packs/day for 3 years    Types: Cigarettes    Quit date: 03/16/1981  . Smokeless tobacco: Current User    Types: Snuff     Comment: "quit smoking cigarettes in the 1980's"  . Alcohol Use: No    Family History  Problem Relation Age of Onset  . Colon cancer Mother     Allergies  Allergen Reactions  . Avelox [Moxifloxacin Hcl In Nacl] Hives, Itching and Other (See Comments)    redness  . Sulfa Antibiotics Itching  . Erythrocin Rash  . Penicillins Rash    Objective: Temp: 98.2 F (36.8 C) (07/13 1528) Temp src: Oral (07/13 1528) BP: 135/88 mmHg (07/13  1528) Pulse Rate: 71 (07/13 1528)  General: He is in no distress seated in his wheelchair Skin: Right arm PICC site appears normal Right knee: Incision healing well. There is one area of redness and mild swelling below his knee laterally. It is not warm to touch or tender.  Lab Results Sedimentation rate 04/23/2014: 58 C. reactive protein 04/23/2014: 3.83   Assessment: His inflammatory markers remain elevated but these are quite nonspecific and may be related to his rheumatoid arthritis. I asked him to call me later this week to let me know how the erythematous area is looking before making a decision about stopping ceftriaxone and having his PICC removed on July 17.  Plan: 1. Followup by phone on July 16 before deciding when to stop IV ceftriaxone   Cliffton Asters, MD [facility] for Infectious Disease [facility] [phone] pager   [phone] cell 04/26/2014, 3:43 PM

## 2014-04-29 ENCOUNTER — Telehealth: Payer: Self-pay | Admitting: *Deleted

## 2014-04-29 NOTE — Telephone Encounter (Signed)
Patient wife called to report that the swelling in his leg has gone down. She removed the brace and it has not come back. She advised the doctor advised her to call and let him know. She also wants to know if this means that we will call [facility] to D/C the patient PICC. Advised her will let the doctor know and give her a call back once I do.

## 2014-04-30 NOTE — Telephone Encounter (Signed)
He is completed 6 weeks of provided therapy. Since he is doing better deal [facility] company orders to stop ceftriaxone and have the PICC pulled.

## 2014-04-30 NOTE — Telephone Encounter (Signed)
Called patient per Dr Orvan Falconer and was advised she has already had the nurse page the doctor and been given the orders to have the PICC pilled to [facility].

## 2014-05-05 NOTE — Progress Notes (Signed)
Please put orders in EPIC as patient has a pre-op appointment on 05/11/2014 at 2 pm! Thank you!

## 2014-05-06 ENCOUNTER — Other Ambulatory Visit: Payer: Self-pay | Admitting: Orthopedic Surgery

## 2014-05-11 ENCOUNTER — Encounter (HOSPITAL_COMMUNITY): Payer: Self-pay

## 2014-05-11 ENCOUNTER — Encounter (HOSPITAL_COMMUNITY)
Admission: RE | Admit: 2014-05-11 | Discharge: 2014-05-11 | Disposition: A | Discharge status: Home / Self Care | Payer: No Typology Code available for payment source | Source: Ambulatory Visit | Attending: Orthopedic Surgery | Admitting: Orthopedic Surgery

## 2014-05-11 ENCOUNTER — Encounter (HOSPITAL_COMMUNITY): Payer: Self-pay | Admitting: Pharmacy Technician

## 2014-05-11 DIAGNOSIS — Z01818 Encounter for other preprocedural examination: Secondary | ICD-10-CM | POA: Insufficient documentation

## 2014-05-11 DIAGNOSIS — Z01812 Encounter for preprocedural laboratory examination: Secondary | ICD-10-CM | POA: Insufficient documentation

## 2014-05-11 LAB — URINALYSIS, ROUTINE W REFLEX MICROSCOPIC
BILIRUBIN URINE: NEGATIVE
Glucose, UA: NEGATIVE mg/dL
Hgb urine dipstick: NEGATIVE
Ketones, ur: NEGATIVE mg/dL
LEUKOCYTES UA: NEGATIVE
NITRITE: NEGATIVE
Protein, ur: NEGATIVE mg/dL
SPECIFIC GRAVITY, URINE: 1.015 (ref 1.005–1.030)
UROBILINOGEN UA: 0.2 mg/dL (ref 0.0–1.0)
pH: 6 (ref 5.0–8.0)

## 2014-05-11 LAB — COMPREHENSIVE METABOLIC PANEL
ALT: 13 U/L (ref 0–53)
AST: 17 U/L (ref 0–37)
Albumin: 3.6 g/dL (ref 3.5–5.2)
Alkaline Phosphatase: 94 U/L (ref 39–117)
Anion gap: 13 (ref 5–15)
BUN: 16 mg/dL (ref 6–23)
CO2: 24 mEq/L (ref 19–32)
Calcium: 9.6 mg/dL (ref 8.4–10.5)
Chloride: 100 mEq/L (ref 96–112)
Creatinine, Ser: 0.9 mg/dL (ref 0.50–1.35)
GFR calc Af Amer: >90 mL/min (ref >90)
GFR calc non Af Amer: >90 mL/min (ref >90)
Glucose, Bld: 104 mg/dL — ABNORMAL HIGH (ref 70–99)
Potassium: 4.4 mEq/L (ref 3.7–5.3)
Sodium: 137 mEq/L (ref 137–147)
Total Bilirubin: 0.3 mg/dL (ref 0.3–1.2)
Total Protein: 8.1 g/dL (ref 6.0–8.3)

## 2014-05-11 LAB — CBC
HCT: 39.1 % (ref 39.0–52.0)
Hemoglobin: 12.5 g/dL — ABNORMAL LOW (ref 13.0–17.0)
MCH: 26.4 pg (ref 26.0–34.0)
MCHC: 32 g/dL (ref 30.0–36.0)
MCV: 82.5 fL (ref 78.0–100.0)
Platelets: 302 10*3/uL (ref 150–400)
RBC: 4.74 MIL/uL (ref 4.22–5.81)
RDW: 14.4 % (ref 11.5–15.5)
WBC: 10 10*3/uL (ref 4.0–10.5)

## 2014-05-11 LAB — SURGICAL PCR SCREEN
MRSA, PCR: NEGATIVE
Staphylococcus aureus: NEGATIVE

## 2014-05-11 LAB — PROTIME-INR
INR: 1.11 (ref 0.00–1.49)
Prothrombin Time: 14.3 seconds (ref 11.6–15.2)

## 2014-05-11 LAB — APTT: aPTT: 28 seconds (ref 24–37)

## 2014-05-11 NOTE — Pre-Procedure Instructions (Signed)
05-11-14 EKG/ CXR 03-16-14  Epic.

## 2014-05-11 NOTE — Patient Instructions (Signed)
[name]  05/11/2014   Your procedure is scheduled on:  8-7 -2015 Friday at 0715 Am.  Enter through [facility] Entrance and follow signs to [facility]. Arrive at    0515    AM.  Call this number if you have problems the morning of surgery: [phone]  Or Presurgical Testing [phone](Sotero Brinkmeyer) For Living Will and/or Health Care Power Attorney Forms: please provide copy for your medical record,may bring AM of surgery(Forms should be already notarized -we do not provide this service).(05-11-14 No information preferred today).     Do not eat or drink past 12 midnight night before surgery.    Take these medicines the morning of surgery with A SIP OF WATER: Metoprolol. Diltiazem. Oxycodone. Aciphex. Prednisone. Bring/ use Advair(if needed)   Do not wear jewelry, make-up or nail polish.  Do not wear lotions, powders, or perfumes. You may wear deodorant.  Do not shave 48 hours(2 days) prior to first CHG shower(legs and under arms).(Shaving face and neck okay.)  Do not bring valuables to the hospital.(Hospital is not responsible for lost valuables).  Contacts, dentures or removable bridgework, body piercing, hair pins may not be worn into surgery.  Leave suitcase in the car. After surgery it may be brought to your room.  For patients admitted to the hospital, checkout time is 11:00 AM the day of discharge.(Restricted visitors-Any Persons displaying flu-like symptoms or illness).    Patients discharged the day of surgery will not be allowed to drive home. Must have responsible person with you x 24 hours once discharged.  Name and phone number of your driver: Curties Conigliaro spouse [phone] cell  Special Instructions: CHG(Chlorhedine 4%-"Hibiclens","Betasept","Aplicare") Shower Use Special Wash: see special instructions.(avoid face and genitals)   Please read over the following fact sheets that you were given: MRSA Information, Blood Transfusion fact sheet, Incentive  Spirometry Instruction.  Remember : Type/Screen "Blue armbands" - may not be removed once applied(would result in being retested AM of surgery, if removed).    ____________________    [facility] - Preparing for Surgery Before surgery, you can play an important role.  Because skin is not sterile, your skin needs to be as free of germs as possible.  You can reduce the number of germs on your skin by washing with CHG (chlorahexidine gluconate) soap before surgery.  CHG is an antiseptic cleaner which kills germs and bonds with the skin to continue killing germs even after washing. Please DO NOT use if you have an allergy to CHG or antibacterial soaps.  If your skin becomes reddened/irritated stop using the CHG and inform your nurse when you arrive at Short Stay. Do not shave (including legs and underarms) for at least 48 hours prior to the first CHG shower.  You may shave your face/neck. Please follow these instructions carefully:  1.  Shower with CHG Soap the night before surgery and the  morning of Surgery.  2.  If you choose to wash your hair, wash your hair first as usual with your  normal  shampoo.  3.  After you shampoo, rinse your hair and body thoroughly to remove the  shampoo.                           4.  Use CHG as you would any other liquid soap.  You can apply chg directly  to the skin and wash  Gently with a scrungie or clean washcloth.  5.  Apply the CHG Soap to your body ONLY FROM THE NECK DOWN.   Do not use on face/ open                           Wound or open sores. Avoid contact with eyes, ears mouth and genitals (private parts).                       Wash face,  Genitals (private parts) with your normal soap.             6.  Wash thoroughly, paying special attention to the area where your surgery  will be performed.  7.  Thoroughly rinse your body with warm water from the neck down.  8.  DO NOT shower/wash with your normal soap after using and rinsing off   the CHG Soap.                9.  Pat yourself dry with a clean towel.            10.  Wear clean pajamas.            11.  Place clean sheets on your bed the night of your first shower and do not  sleep with pets. Day of Surgery : Do not apply any lotions/deodorants the morning of surgery.  Please wear clean clothes to the hospital/surgery center.  FAILURE TO FOLLOW THESE INSTRUCTIONS MAY RESULT IN THE CANCELLATION OF YOUR SURGERY PATIENT SIGNATURE_________________________________  NURSE SIGNATURE__________________________________  ________________________________________________________________________   [name]  An incentive spirometer is a tool that can help keep your lungs clear and active. This tool measures how well you are filling your lungs with each breath. Taking long deep breaths may help reverse or decrease the chance of developing breathing (pulmonary) problems (especially infection) following:  A long period of time when you are unable to move or be active. BEFORE THE PROCEDURE   If the spirometer includes an indicator to show your best effort, your nurse or respiratory therapist will set it to a desired goal.  If possible, sit up straight or lean slightly forward. Try not to slouch.  Hold the incentive spirometer in an upright position. INSTRUCTIONS FOR USE  1. Sit on the edge of your bed if possible, or sit up as far as you can in bed or on a chair. 2. Hold the incentive spirometer in an upright position. 3. Breathe out normally. 4. Place the mouthpiece in your mouth and seal your lips tightly around it. 5. Breathe in slowly and as deeply as possible, raising the piston or the ball toward the top of the column. 6. Hold your breath for 3-5 seconds or for as long as possible. Allow the piston or ball to fall to the bottom of the column. 7. Remove the mouthpiece from your mouth and breathe out normally. 8. Rest for a few seconds and repeat Steps 1 through 7  at least 10 times every 1-2 hours when you are awake. Take your time and take a few normal breaths between deep breaths. 9. The spirometer may include an indicator to show your best effort. Use the indicator as a goal to work toward during each repetition. 10. After each set of 10 deep breaths, practice coughing to be sure your lungs are clear. If you have an incision (the cut made at the time of  surgery), support your incision when coughing by placing a pillow or rolled up towels firmly against it. Once you are able to get out of bed, walk around indoors and cough well. You may stop using the incentive spirometer when instructed by your caregiver.  RISKS AND COMPLICATIONS  Take your time so you do not get dizzy or light-headed.  If you are in pain, you may need to take or ask for pain medication before doing incentive spirometry. It is harder to take a deep breath if you are having pain. AFTER USE  Rest and breathe slowly and easily.  It can be helpful to keep track of a log of your progress. Your caregiver can provide you with a simple table to help with this. If you are using the spirometer at home, follow these instructions: SEEK MEDICAL CARE IF:   You are having difficultly using the spirometer.  You have trouble using the spirometer as often as instructed.  Your pain medication is not giving enough relief while using the spirometer.  You develop fever of 100.5 F (38.1 C) or higher. SEEK IMMEDIATE MEDICAL CARE IF:   You cough up bloody sputum that had not been present before.  You develop fever of 102 F (38.9 C) or greater.  You develop worsening pain at or near the incision site. MAKE SURE YOU:   Understand these instructions.  Will watch your condition.  Will get help right away if you are not doing well or get worse. Document Released: 02/11/2007 Document Revised: 12/24/2011 Document Reviewed: 04/14/2007 ExitCare Patient Information 2014 ExitCare,  [place].   ________________________________________________________________________  WHAT IS A BLOOD TRANSFUSION? Blood Transfusion Information  A transfusion is the replacement of blood or some of its parts. Blood is made up of multiple cells which provide different functions.  Red blood cells carry oxygen and are used for blood loss replacement.  White blood cells fight against infection.  Platelets control bleeding.  Plasma helps clot blood.  Other blood products are available for specialized needs, such as hemophilia or other clotting disorders. BEFORE THE TRANSFUSION  Who gives blood for transfusions?   Healthy volunteers who are fully evaluated to make sure their blood is safe. This is blood bank blood. Transfusion therapy is the safest it has ever been in the practice of medicine. Before blood is taken from a donor, a complete history is taken to make sure that person has no history of diseases nor engages in risky social behavior (examples are intravenous drug use or sexual activity with multiple partners). The donor's travel history is screened to minimize risk of transmitting infections, such as malaria. The donated blood is tested for signs of infectious diseases, such as HIV and hepatitis. The blood is then tested to be sure it is compatible with you in order to minimize the chance of a transfusion reaction. If you or a relative donates blood, this is often done in anticipation of surgery and is not appropriate for emergency situations. It takes many days to process the donated blood. RISKS AND COMPLICATIONS Although transfusion therapy is very safe and saves many lives, the main dangers of transfusion include:   Getting an infectious disease.  Developing a transfusion reaction. This is an allergic reaction to something in the blood you were given. Every precaution is taken to prevent this. The decision to have a blood transfusion has been considered carefully by your caregiver  before blood is given. Blood is not given unless the benefits outweigh the risks. AFTER THE  TRANSFUSION  Right after receiving a blood transfusion, you will usually feel much better and more energetic. This is especially true if your red blood cells have gotten low (anemic). The transfusion raises the level of the red blood cells which carry oxygen, and this usually causes an energy increase.  The nurse administering the transfusion will monitor you carefully for complications. HOME CARE INSTRUCTIONS  No special instructions are needed after a transfusion. You may find your energy is better. Speak with your caregiver about any limitations on activity for underlying diseases you may have. SEEK MEDICAL CARE IF:   Your condition is not improving after your transfusion.  You develop redness or irritation at the intravenous (IV) site. SEEK IMMEDIATE MEDICAL CARE IF:  Any of the following symptoms occur over the next 12 hours:  Shaking chills.  You have a temperature by mouth above 102 F (38.9 C), not controlled by medicine.  Chest, back, or muscle pain.  People around you feel you are not acting correctly or are confused.  Shortness of breath or difficulty breathing.  Dizziness and fainting.  You get a rash or develop hives.  You have a decrease in urine output.  Your urine turns a dark color or changes to pink, red, or brown. Any of the following symptoms occur over the next 10 days:  You have a temperature by mouth above 102 F (38.9 C), not controlled by medicine.  Shortness of breath.  Weakness after normal activity.  The white part of the eye turns yellow (jaundice).  You have a decrease in the amount of urine or are urinating less often.  Your urine turns a dark color or changes to pink, red, or brown. Document Released: 09/28/2000 Document Revised: 12/24/2011 Document Reviewed: 05/17/2008 [facility] Patient Information 2014 [place],  [place].  _______________________________________________________________________

## 2014-05-11 NOTE — Pre-Procedure Instructions (Deleted)
20 [name]  05/11/2014   Your procedure is scheduled on:  8-7 -2015 Friday at 0715 Am.  Enter through [facility] Entrance and follow signs to [facility]. Arrive at    0515    AM.  Call this number if you have problems the morning of surgery: [phone]  Or Presurgical Testing [phone](Evyn Putzier) For Living Will and/or Health Care Power Attorney Forms: please provide copy for your medical record,may bring AM of surgery(Forms should be already notarized -we do not provide this service).(05-11-14 No information preferred today).     Do not eat or drink past 12 midnight night before surgery.    Take these medicines the morning of surgery with A SIP OF WATER: Metoprolol. Diltiazem. Oxycodone. Aciphex. Prednisone. Bring/ use Advair(if needed)   Do not wear jewelry, make-up or nail polish.  Do not wear lotions, powders, or perfumes. You may wear deodorant.  Do not shave 48 hours(2 days) prior to first CHG shower(legs and under arms).(Shaving face and neck okay.)  Do not bring valuables to the hospital.(Hospital is not responsible for lost valuables).  Contacts, dentures or removable bridgework, body piercing, hair pins may not be worn into surgery.  Leave suitcase in the car. After surgery it may be brought to your room.  For patients admitted to the hospital, checkout time is 11:00 AM the day of discharge.(Restricted visitors-Any Persons displaying flu-like symptoms or illness).    Patients discharged the day of surgery will not be allowed to drive home. Must have responsible person with you x 24 hours once discharged.  Name and phone number of your driver: Zoltan Genest spouse [phone] cell  Special Instructions: CHG(Chlorhedine 4%-"Hibiclens","Betasept","Aplicare") Shower Use Special Wash: see special instructions.(avoid face and genitals)   Please read over the following fact sheets that you were given: MRSA Information, Blood Transfusion fact sheet, Incentive  Spirometry Instruction.  Remember : Type/Screen "Blue armbands" - may not be removed once applied(would result in being retested AM of surgery, if removed).    ____________________    [facility] - Preparing for Surgery Before surgery, you can play an important role.  Because skin is not sterile, your skin needs to be as free of germs as possible.  You can reduce the number of germs on your skin by washing with CHG (chlorahexidine gluconate) soap before surgery.  CHG is an antiseptic cleaner which kills germs and bonds with the skin to continue killing germs even after washing. Please DO NOT use if you have an allergy to CHG or antibacterial soaps.  If your skin becomes reddened/irritated stop using the CHG and inform your nurse when you arrive at Short Stay. Do not shave (including legs and underarms) for at least 48 hours prior to the first CHG shower.  You may shave your face/neck. Please follow these instructions carefully:  1.  Shower with CHG Soap the night before surgery and the  morning of Surgery.  2.  If you choose to wash your hair, wash your hair first as usual with your  normal  shampoo.  3.  After you shampoo, rinse your hair and body thoroughly to remove the  shampoo.                           4.  Use CHG as you would any other liquid soap.  You can apply chg directly  to the skin and wash  Gently with a scrungie or clean washcloth.  5.  Apply the CHG Soap to your body ONLY FROM THE NECK DOWN.   Do not use on face/ open                           Wound or open sores. Avoid contact with eyes, ears mouth and genitals (private parts).                       Wash face,  Genitals (private parts) with your normal soap.             6.  Wash thoroughly, paying special attention to the area where your surgery  will be performed.  7.  Thoroughly rinse your body with warm water from the neck down.  8.  DO NOT shower/wash with your normal soap after using and rinsing off   the CHG Soap.                9.  Pat yourself dry with a clean towel.            10.  Wear clean pajamas.            11.  Place clean sheets on your bed the night of your first shower and do not  sleep with pets. Day of Surgery : Do not apply any lotions/deodorants the morning of surgery.  Please wear clean clothes to the hospital/surgery center.  FAILURE TO FOLLOW THESE INSTRUCTIONS MAY RESULT IN THE CANCELLATION OF YOUR SURGERY PATIENT SIGNATURE_________________________________  NURSE SIGNATURE__________________________________  ________________________________________________________________________   [name]  An incentive spirometer is a tool that can help keep your lungs clear and active. This tool measures how well you are filling your lungs with each breath. Taking long deep breaths may help reverse or decrease the chance of developing breathing (pulmonary) problems (especially infection) following:  A long period of time when you are unable to move or be active. BEFORE THE PROCEDURE   If the spirometer includes an indicator to show your best effort, your nurse or respiratory therapist will set it to a desired goal.  If possible, sit up straight or lean slightly forward. Try not to slouch.  Hold the incentive spirometer in an upright position. INSTRUCTIONS FOR USE  1. Sit on the edge of your bed if possible, or sit up as far as you can in bed or on a chair. 2. Hold the incentive spirometer in an upright position. 3. Breathe out normally. 4. Place the mouthpiece in your mouth and seal your lips tightly around it. 5. Breathe in slowly and as deeply as possible, raising the piston or the ball toward the top of the column. 6. Hold your breath for 3-5 seconds or for as long as possible. Allow the piston or ball to fall to the bottom of the column. 7. Remove the mouthpiece from your mouth and breathe out normally. 8. Rest for a few seconds and repeat Steps 1 through 7  at least 10 times every 1-2 hours when you are awake. Take your time and take a few normal breaths between deep breaths. 9. The spirometer may include an indicator to show your best effort. Use the indicator as a goal to work toward during each repetition. 10. After each set of 10 deep breaths, practice coughing to be sure your lungs are clear. If you have an incision (the cut made at the time of  surgery), support your incision when coughing by placing a pillow or rolled up towels firmly against it. Once you are able to get out of bed, walk around indoors and cough well. You may stop using the incentive spirometer when instructed by your caregiver.  RISKS AND COMPLICATIONS  Take your time so you do not get dizzy or light-headed.  If you are in pain, you may need to take or ask for pain medication before doing incentive spirometry. It is harder to take a deep breath if you are having pain. AFTER USE  Rest and breathe slowly and easily.  It can be helpful to keep track of a log of your progress. Your caregiver can provide you with a simple table to help with this. If you are using the spirometer at home, follow these instructions: SEEK MEDICAL CARE IF:   You are having difficultly using the spirometer.  You have trouble using the spirometer as often as instructed.  Your pain medication is not giving enough relief while using the spirometer.  You develop fever of 100.5 F (38.1 C) or higher. SEEK IMMEDIATE MEDICAL CARE IF:   You cough up bloody sputum that had not been present before.  You develop fever of 102 F (38.9 C) or greater.  You develop worsening pain at or near the incision site. MAKE SURE YOU:   Understand these instructions.  Will watch your condition.  Will get help right away if you are not doing well or get worse. Document Released: 02/11/2007 Document Revised: 12/24/2011 Document Reviewed: 04/14/2007 ExitCare Patient Information 2014 ExitCare,  [place].   ________________________________________________________________________  WHAT IS A BLOOD TRANSFUSION? Blood Transfusion Information  A transfusion is the replacement of blood or some of its parts. Blood is made up of multiple cells which provide different functions.  Red blood cells carry oxygen and are used for blood loss replacement.  White blood cells fight against infection.  Platelets control bleeding.  Plasma helps clot blood.  Other blood products are available for specialized needs, such as hemophilia or other clotting disorders. BEFORE THE TRANSFUSION  Who gives blood for transfusions?   Healthy volunteers who are fully evaluated to make sure their blood is safe. This is blood bank blood. Transfusion therapy is the safest it has ever been in the practice of medicine. Before blood is taken from a donor, a complete history is taken to make sure that person has no history of diseases nor engages in risky social behavior (examples are intravenous drug use or sexual activity with multiple partners). The donor's travel history is screened to minimize risk of transmitting infections, such as malaria. The donated blood is tested for signs of infectious diseases, such as HIV and hepatitis. The blood is then tested to be sure it is compatible with you in order to minimize the chance of a transfusion reaction. If you or a relative donates blood, this is often done in anticipation of surgery and is not appropriate for emergency situations. It takes many days to process the donated blood. RISKS AND COMPLICATIONS Although transfusion therapy is very safe and saves many lives, the main dangers of transfusion include:   Getting an infectious disease.  Developing a transfusion reaction. This is an allergic reaction to something in the blood you were given. Every precaution is taken to prevent this. The decision to have a blood transfusion has been considered carefully by your caregiver  before blood is given. Blood is not given unless the benefits outweigh the risks. AFTER THE  TRANSFUSION  Right after receiving a blood transfusion, you will usually feel much better and more energetic. This is especially true if your red blood cells have gotten low (anemic). The transfusion raises the level of the red blood cells which carry oxygen, and this usually causes an energy increase.  The nurse administering the transfusion will monitor you carefully for complications. HOME CARE INSTRUCTIONS  No special instructions are needed after a transfusion. You may find your energy is better. Speak with your caregiver about any limitations on activity for underlying diseases you may have. SEEK MEDICAL CARE IF:   Your condition is not improving after your transfusion.  You develop redness or irritation at the intravenous (IV) site. SEEK IMMEDIATE MEDICAL CARE IF:  Any of the following symptoms occur over the next 12 hours:  Shaking chills.  You have a temperature by mouth above 102 F (38.9 C), not controlled by medicine.  Chest, back, or muscle pain.  People around you feel you are not acting correctly or are confused.  Shortness of breath or difficulty breathing.  Dizziness and fainting.  You get a rash or develop hives.  You have a decrease in urine output.  Your urine turns a dark color or changes to pink, red, or brown. Any of the following symptoms occur over the next 10 days:  You have a temperature by mouth above 102 F (38.9 C), not controlled by medicine.  Shortness of breath.  Weakness after normal activity.  The white part of the eye turns yellow (jaundice).  You have a decrease in the amount of urine or are urinating less often.  Your urine turns a dark color or changes to pink, red, or brown. Document Released: 09/28/2000 Document Revised: 12/24/2011 Document Reviewed: 05/17/2008 [facility] Patient Information 2014 [place],  [place].  _______________________________________________________________________

## 2014-05-20 ENCOUNTER — Other Ambulatory Visit: Payer: Self-pay | Admitting: Orthopedic Surgery

## 2014-05-20 ENCOUNTER — Encounter (HOSPITAL_COMMUNITY): Payer: Self-pay | Admitting: Anesthesiology

## 2014-05-20 NOTE — H&P (Signed)
[name] Married / Language: English / Race: White Male Date of Admission:  05/21/2014 Chief complaint: Right knee pain History of Present Illness The patient is a 53 year old male who comes in for a preoperative History and Physical. The patient is scheduled for a right reimplantation total knee to be performed by Dr. Gus Rankin. Aluisio, MD at [facility] on 05/21/2014. The patient is a 53 year old male presenting for a post-operative visit. The patient comes in over 6 weeks out from right total knee arthroplasty (right knee arthroplasty resection with antibiotic spacer placement). The patient states that he/she is doing fair at this time. The pain is under fair control at this time and describe their pain as 3-4 / 10 on a 10 point pain scale. They are currently on Oxycodone (1 PO PRN and Robaxin 1 tab BID PRN) for their pain. The patient is currently doing home exercise program. He has competed the IV antibiotics. He feels as though the knee is doing better. He is now ready for the knee reimplantation procedure. They have been treated with an extened course of IV antibiotics via PICC line and has improved with regards to the knee. It is felt that they would benefit from undergoing total joint reimplanmtation. Risks and benefits of the procedure have been discussed with the patient and they elect to proceed with surgery. There are no active contraindications to surgery such as ongoing infection or rapidly progressive neurological disease.  Allergies Penicillin VK *PENICILLINS* Rash, Itching. Avelox ABC Pack *FLUOROQUINOLONES* Facial Flushing, Coughing Erythromycin *MACROLIDES* Itching, Rash. Sulfanilamide *CHEMICALS* Rash.  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 mother Chronic Obstructive Lung Disease father  Social History Illicit drug use no Alcohol use never consumed alcohol no Living situation live with spouse Current work status working full time Pain Contract no Number of flights of stairs before winded 1 Tobacco / smoke exposure no Drug/Alcohol Rehab (Previously) no Marital status married Tobacco use Former smoker. never smoker Children 1 Exercise Exercises rarely Drug/Alcohol Rehab (Currently) no  Medication History  OxyCODONE HCl (5MG  Tablet, 1-2 Tablet Oral po q4-6h prn pain, Taken starting 03/23/2014) Active. PredniSONE (5MG  Tablet, Oral) Active. Furosemide (40MG  Tablet, Oral) Active. Metoprolol Succinate ER (50MG  Tablet ER 24HR, Oral) Active. Aciphex (20MG  Tablet DR, Oral) Active. Losartan Potassium (100MG  Tablet, Oral) Active. Robaxin (500MG  Tablet, Oral) Active. (PRN) Diltiazem HCl ER (240MG  Capsule ER 24HR, Oral) Active.  Past Surgical History Total Knee Replacement bilateral Hip Fracture and Surgery bilateral Total Hip Replacement bilateral   Review of Systems General Not Present- Chills, Fatigue, Fever, Memory Loss, Night Sweats, Weight Gain and Weight Loss. Skin Not Present- Eczema, Hives, Itching, Lesions and Rash. HEENT Not Present- Dentures, Double Vision, Headache, Hearing Loss, Tinnitus and Visual Loss. Respiratory Not Present- Allergies, Chronic Cough, Coughing up blood, Shortness of breath at rest and Shortness of breath with exertion. Cardiovascular Not Present- Chest Pain, Difficulty Breathing Lying Down, Murmur, Palpitations,  Racing/skipping heartbeats and Swelling. Gastrointestinal Not Present- Abdominal Pain, Bloody Stool, Constipation, Diarrhea, Difficulty Swallowing, Heartburn, Jaundice, Loss of  appetitie, Nausea and Vomiting. Male Genitourinary Not Present- Blood in Urine, Discharge, Flank Pain, Incontinence, Painful Urination, Urgency, Urinary frequency, Urinary Retention, Urinating at Night and Weak urinary stream. Musculoskeletal Present- Joint Pain. Not Present- Back Pain, Joint Swelling, Morning Stiffness, Muscle Pain, Muscle Weakness and Spasms. Neurological Not Present- Blackout spells, Difficulty with balance, Dizziness, Paralysis, Tremor and Weakness. Psychiatric Not Present- Insomnia.   Vitals Weight: 288.7 lb Height: 68in Weight was reported by patient. Height was reported by patient. Body Surface Area: 2.51 m Body Mass Index: 43.9 kg/m BP: 114/66 (Sitting, Right Arm, Standard)   Physical Exam  General Mental Status -Alert, cooperative and good historian. General Appearance-pleasant, Not in acute distress. Orientation-Oriented X3. Build & Nutrition-Well nourished and Well developed.  Head and Neck Head-normocephalic, atraumatic . Neck Global Assessment - supple, no bruit auscultated on the right, no bruit auscultated on the left.  Eye Vision-Wears corrective lenses. Pupil - Bilateral-Regular and Round. Motion - Bilateral-EOMI.  Chest and Lung Exam Auscultation Breath sounds - clear at anterior chest wall and clear at posterior chest wall. Adventitious sounds - No Adventitious sounds.  Cardiovascular Auscultation Rhythm - Regular rate and rhythm. Heart Sounds - S1 WNL and S2 WNL. Murmurs & Other Heart Sounds: Murmur 1 - Location - Aortic Area. Timing - Early systolic. Grade - II/VI.  Abdomen Inspection Contour - Generalized moderate distention. Palpation/Percussion Tenderness - Abdomen is non-tender to palpation. Rigidity (guarding) - Abdomen is  soft. Auscultation Auscultation of the abdomen reveals - Bowel sounds normal.  Male Genitourinary Note: Not done, not pertinent to present illness   Musculoskeletal Note: On exam, he is alert and oriented in no apparent distress. His right knee shows a well healed incision. The swelling is essentially gone now. There is no erythema. His lower leg swelling has also improved tremendously.  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 Lequita Halt.  Plan is to go home.  PCP - Dr. Jarome Matin  The patient does not have any contraindications and will receive TXA (tranexamic acid) prior to surgery.  Signed electronically by Beckey Rutter, III PA-C

## 2014-05-20 NOTE — Anesthesia Preprocedure Evaluation (Addendum)
Anesthesia Evaluation  Patient identified by MRN, date of birth, ID band Patient awake    Reviewed: Allergy & Precautions, H&P , NPO status , Patient's Chart, lab work & pertinent test results  Airway Mallampati: II TM Distance: >3 FB Neck ROM: Full    Dental no notable dental hx.    Pulmonary asthma , sleep apnea , former smoker,  breath sounds clear to auscultation  Pulmonary exam normal       Cardiovascular hypertension, Pt. on medications and Pt. on home beta blockers + Peripheral Vascular Disease Rhythm:Regular Rate:Normal     Neuro/Psych negative neurological ROS  negative psych ROS   GI/Hepatic Neg liver ROS, GERD-  ,  Endo/Other  Morbid obesity  Renal/GU negative Renal ROS  negative genitourinary   Musculoskeletal  (+) Arthritis -, Rheumatoid disorders,    Abdominal (+) + obese,   Peds negative pediatric ROS (+)  Hematology  (+) anemia ,   Anesthesia Other Findings   Reproductive/Obstetrics negative OB ROS                          Anesthesia Physical Anesthesia Plan  ASA: III  Anesthesia Plan: General   Post-op Pain Management:    Induction: Intravenous  Airway Management Planned: Oral ETT  Additional Equipment:   Intra-op Plan:   Post-operative Plan: Extubation in OR  Informed Consent: I have reviewed the patients History and Physical, chart, labs and discussed the procedure including the risks, benefits and alternatives for the proposed anesthesia with the patient or authorized representative who has indicated his/her understanding and acceptance.   Dental advisory given  Plan Discussed with: CRNA  Anesthesia Plan Comments: (Discussed general and spinal anesthesia. Patient strongly prefers general.)       Anesthesia Quick Evaluation

## 2014-05-21 ENCOUNTER — Encounter (HOSPITAL_COMMUNITY): Payer: 59 | Admitting: Anesthesiology

## 2014-05-21 ENCOUNTER — Encounter (HOSPITAL_COMMUNITY)
Admission: RE | Disposition: A | Discharge status: Home Health | Payer: Self-pay | Source: Ambulatory Visit | Attending: Orthopedic Surgery

## 2014-05-21 ENCOUNTER — Encounter (HOSPITAL_COMMUNITY): Payer: Self-pay | Admitting: *Deleted

## 2014-05-21 ENCOUNTER — Inpatient Hospital Stay (HOSPITAL_COMMUNITY)
Admission: RE | Admit: 2014-05-21 | Discharge: 2014-05-23 | DRG: 467 | Disposition: A | Discharge status: Home Health | Payer: 59 | Source: Ambulatory Visit | Attending: Orthopedic Surgery | Admitting: Orthopedic Surgery

## 2014-05-21 ENCOUNTER — Inpatient Hospital Stay (HOSPITAL_COMMUNITY): Payer: 59 | Admitting: Anesthesiology

## 2014-05-21 DIAGNOSIS — M179 Osteoarthritis of knee, unspecified: Secondary | ICD-10-CM | POA: Diagnosis present

## 2014-05-21 DIAGNOSIS — M1710 Unilateral primary osteoarthritis, unspecified knee: Secondary | ICD-10-CM | POA: Diagnosis present

## 2014-05-21 DIAGNOSIS — Z89529 Acquired absence of unspecified knee: Secondary | ICD-10-CM

## 2014-05-21 DIAGNOSIS — Z01812 Encounter for preprocedural laboratory examination: Secondary | ICD-10-CM | POA: Diagnosis not present

## 2014-05-21 DIAGNOSIS — M21869 Other specified acquired deformities of unspecified lower leg: Secondary | ICD-10-CM | POA: Diagnosis present

## 2014-05-21 DIAGNOSIS — Z8249 Family history of ischemic heart disease and other diseases of the circulatory system: Secondary | ICD-10-CM | POA: Diagnosis not present

## 2014-05-21 DIAGNOSIS — Z96649 Presence of unspecified artificial hip joint: Secondary | ICD-10-CM | POA: Diagnosis not present

## 2014-05-21 DIAGNOSIS — I739 Peripheral vascular disease, unspecified: Secondary | ICD-10-CM | POA: Diagnosis present

## 2014-05-21 DIAGNOSIS — Z87891 Personal history of nicotine dependence: Secondary | ICD-10-CM

## 2014-05-21 DIAGNOSIS — K219 Gastro-esophageal reflux disease without esophagitis: Secondary | ICD-10-CM | POA: Diagnosis present

## 2014-05-21 DIAGNOSIS — M069 Rheumatoid arthritis, unspecified: Secondary | ICD-10-CM | POA: Diagnosis present

## 2014-05-21 DIAGNOSIS — G4733 Obstructive sleep apnea (adult) (pediatric): Secondary | ICD-10-CM | POA: Diagnosis present

## 2014-05-21 DIAGNOSIS — M25569 Pain in unspecified knee: Secondary | ICD-10-CM | POA: Diagnosis present

## 2014-05-21 DIAGNOSIS — J45909 Unspecified asthma, uncomplicated: Secondary | ICD-10-CM | POA: Diagnosis present

## 2014-05-21 DIAGNOSIS — Z6841 Body Mass Index (BMI) 40.0 and over, adult: Secondary | ICD-10-CM

## 2014-05-21 DIAGNOSIS — T8453XA Infection and inflammatory reaction due to internal right knee prosthesis, initial encounter: Secondary | ICD-10-CM

## 2014-05-21 DIAGNOSIS — IMO0002 Reserved for concepts with insufficient information to code with codable children: Secondary | ICD-10-CM | POA: Diagnosis not present

## 2014-05-21 DIAGNOSIS — Z79899 Other long term (current) drug therapy: Secondary | ICD-10-CM | POA: Diagnosis not present

## 2014-05-21 DIAGNOSIS — I10 Essential (primary) hypertension: Secondary | ICD-10-CM | POA: Diagnosis present

## 2014-05-21 HISTORY — PX: TOTAL KNEE ARTHROPLASTY: SHX125

## 2014-05-21 LAB — GRAM STAIN

## 2014-05-21 LAB — TYPE AND SCREEN
ABO/RH(D): O POS
ANTIBODY SCREEN: NEGATIVE

## 2014-05-21 SURGERY — ARTHROPLASTY, KNEE, TOTAL
Anesthesia: General | Site: Knee | Laterality: Right

## 2014-05-21 MED ORDER — CEFAZOLIN SODIUM 10 G IJ SOLR
3.0000 g | INTRAMUSCULAR | Status: AC
  Administered 2014-05-21: 3 g via INTRAVENOUS
  Filled 2014-05-21: qty 3000

## 2014-05-21 MED ORDER — PANTOPRAZOLE SODIUM 40 MG PO TBEC
40.0000 mg | DELAYED_RELEASE_TABLET | Freq: Every day | ORAL | Status: DC
  Administered 2014-05-22 – 2014-05-23 (×2): 40 mg via ORAL
  Filled 2014-05-21 (×2): qty 1

## 2014-05-21 MED ORDER — ACETAMINOPHEN 500 MG PO TABS
1000.0000 mg | ORAL_TABLET | Freq: Four times a day (QID) | ORAL | Status: AC
  Administered 2014-05-21 (×2): 1000 mg via ORAL
  Filled 2014-05-21 (×2): qty 2

## 2014-05-21 MED ORDER — BUPIVACAINE HCL (PF) 0.25 % IJ SOLN
INTRAMUSCULAR | Status: AC
  Filled 2014-05-21: qty 30

## 2014-05-21 MED ORDER — DIPHENHYDRAMINE HCL 12.5 MG/5ML PO ELIX: 12.5000 mg | ORAL_SOLUTION | ORAL | Status: DC | PRN

## 2014-05-21 MED ORDER — HYDROMORPHONE HCL PF 1 MG/ML IJ SOLN
0.2500 mg | INTRAMUSCULAR | Status: DC | PRN
  Administered 2014-05-21 (×4): 0.5 mg via INTRAVENOUS

## 2014-05-21 MED ORDER — DEXAMETHASONE SODIUM PHOSPHATE 10 MG/ML IJ SOLN: 10.0000 mg | Freq: Every day | INTRAMUSCULAR | Status: DC

## 2014-05-21 MED ORDER — PREDNISONE 5 MG PO TABS
5.0000 mg | ORAL_TABLET | Freq: Two times a day (BID) | ORAL | Status: DC
  Administered 2014-05-23: 5 mg via ORAL
  Filled 2014-05-21 (×2): qty 1

## 2014-05-21 MED ORDER — MENTHOL 3 MG MT LOZG: 1.0000 | LOZENGE | OROMUCOSAL | Status: DC | PRN

## 2014-05-21 MED ORDER — FUROSEMIDE 40 MG PO TABS
40.0000 mg | ORAL_TABLET | Freq: Every morning | ORAL | Status: DC
  Administered 2014-05-21 – 2014-05-23 (×3): 40 mg via ORAL
  Filled 2014-05-21 (×3): qty 1

## 2014-05-21 MED ORDER — RIVAROXABAN 10 MG PO TABS
10.0000 mg | ORAL_TABLET | Freq: Every day | ORAL | Status: DC
  Administered 2014-05-22 – 2014-05-23 (×2): 10 mg via ORAL
  Filled 2014-05-21 (×3): qty 1

## 2014-05-21 MED ORDER — PREDNISONE 20 MG PO TABS
20.0000 mg | ORAL_TABLET | Freq: Once | ORAL | Status: AC
  Administered 2014-05-21: 20 mg via ORAL
  Filled 2014-05-21: qty 1

## 2014-05-21 MED ORDER — POLYETHYLENE GLYCOL 3350 17 G PO PACK
17.0000 g | PACK | Freq: Every day | ORAL | Status: DC | PRN
  Administered 2014-05-22 – 2014-05-23 (×3): 17 g via ORAL
  Filled 2014-05-21: qty 1

## 2014-05-21 MED ORDER — FENTANYL CITRATE 0.05 MG/ML IJ SOLN
INTRAMUSCULAR | Status: DC | PRN
  Administered 2014-05-21 (×2): 50 ug via INTRAVENOUS
  Administered 2014-05-21: 100 ug via INTRAVENOUS

## 2014-05-21 MED ORDER — FLEET ENEMA 7-19 GM/118ML RE ENEM: 1.0000 | ENEMA | Freq: Once | RECTAL | Status: AC | PRN

## 2014-05-21 MED ORDER — DILTIAZEM HCL ER COATED BEADS 240 MG PO CP24
240.0000 mg | ORAL_CAPSULE | Freq: Every morning | ORAL | Status: DC
  Administered 2014-05-22 – 2014-05-23 (×2): 240 mg via ORAL
  Filled 2014-05-21 (×2): qty 1

## 2014-05-21 MED ORDER — MORPHINE SULFATE 2 MG/ML IJ SOLN
1.0000 mg | INTRAMUSCULAR | Status: DC | PRN
  Administered 2014-05-21 – 2014-05-22 (×4): 1 mg via INTRAVENOUS
  Filled 2014-05-21 (×4): qty 1

## 2014-05-21 MED ORDER — METOPROLOL SUCCINATE ER 50 MG PO TB24
50.0000 mg | ORAL_TABLET | Freq: Every morning | ORAL | Status: DC
  Administered 2014-05-22 – 2014-05-23 (×2): 50 mg via ORAL
  Filled 2014-05-21 (×2): qty 1

## 2014-05-21 MED ORDER — ONDANSETRON HCL 4 MG/2ML IJ SOLN
INTRAMUSCULAR | Status: DC | PRN
  Administered 2014-05-21: 4 mg via INTRAVENOUS

## 2014-05-21 MED ORDER — DEXAMETHASONE SODIUM PHOSPHATE 10 MG/ML IJ SOLN
INTRAMUSCULAR | Status: AC
  Filled 2014-05-21: qty 1

## 2014-05-21 MED ORDER — MIDAZOLAM HCL 5 MG/5ML IJ SOLN
INTRAMUSCULAR | Status: DC | PRN
  Administered 2014-05-21: 2 mg via INTRAVENOUS

## 2014-05-21 MED ORDER — PHENOL 1.4 % MT LIQD: 1.0000 | OROMUCOSAL | Status: DC | PRN

## 2014-05-21 MED ORDER — BUPIVACAINE LIPOSOME 1.3 % IJ SUSP
20.0000 mL | Freq: Once | INTRAMUSCULAR | Status: DC
  Filled 2014-05-21: qty 20

## 2014-05-21 MED ORDER — ACETAMINOPHEN 10 MG/ML IV SOLN
1000.0000 mg | Freq: Once | INTRAVENOUS | Status: AC
  Administered 2014-05-21: 1000 mg via INTRAVENOUS
  Filled 2014-05-21: qty 100

## 2014-05-21 MED ORDER — ONDANSETRON HCL 4 MG/2ML IJ SOLN: 4.0000 mg | Freq: Four times a day (QID) | INTRAMUSCULAR | Status: DC | PRN

## 2014-05-21 MED ORDER — HYDROMORPHONE HCL PF 2 MG/ML IJ SOLN
INTRAMUSCULAR | Status: AC
  Filled 2014-05-21: qty 1

## 2014-05-21 MED ORDER — SUCCINYLCHOLINE CHLORIDE 20 MG/ML IJ SOLN
INTRAMUSCULAR | Status: DC | PRN
  Administered 2014-05-21: 100 mg via INTRAVENOUS

## 2014-05-21 MED ORDER — ONDANSETRON HCL 4 MG/2ML IJ SOLN
INTRAMUSCULAR | Status: AC
  Filled 2014-05-21: qty 2

## 2014-05-21 MED ORDER — DEXAMETHASONE SODIUM PHOSPHATE 10 MG/ML IJ SOLN
10.0000 mg | Freq: Once | INTRAMUSCULAR | Status: AC
  Administered 2014-05-21: 10 mg via INTRAVENOUS

## 2014-05-21 MED ORDER — ACETAMINOPHEN 650 MG RE SUPP: 650.0000 mg | Freq: Four times a day (QID) | RECTAL | Status: DC | PRN

## 2014-05-21 MED ORDER — GLYCOPYRROLATE 0.2 MG/ML IJ SOLN
INTRAMUSCULAR | Status: DC | PRN
  Administered 2014-05-21: 0.4 mg via INTRAVENOUS

## 2014-05-21 MED ORDER — TRAMADOL HCL 50 MG PO TABS: 50.0000 mg | ORAL_TABLET | Freq: Four times a day (QID) | ORAL | Status: DC | PRN

## 2014-05-21 MED ORDER — SODIUM CHLORIDE 0.9 % IJ SOLN
INTRAMUSCULAR | Status: AC
  Filled 2014-05-21: qty 50

## 2014-05-21 MED ORDER — PREDNISONE 10 MG PO TABS
10.0000 mg | ORAL_TABLET | Freq: Two times a day (BID) | ORAL | Status: AC
  Administered 2014-05-22 (×2): 10 mg via ORAL
  Filled 2014-05-21 (×2): qty 1

## 2014-05-21 MED ORDER — DEXAMETHASONE 6 MG PO TABS: 10.0000 mg | ORAL_TABLET | Freq: Every day | ORAL | Status: DC

## 2014-05-21 MED ORDER — KETOROLAC TROMETHAMINE 15 MG/ML IJ SOLN
7.5000 mg | Freq: Four times a day (QID) | INTRAMUSCULAR | Status: AC | PRN
  Administered 2014-05-21 (×2): 7.5 mg via INTRAVENOUS
  Filled 2014-05-21 (×2): qty 1

## 2014-05-21 MED ORDER — OXYCODONE HCL 5 MG PO TABS
5.0000 mg | ORAL_TABLET | ORAL | Status: DC | PRN
  Administered 2014-05-21 (×2): 5 mg via ORAL
  Administered 2014-05-21 – 2014-05-23 (×6): 10 mg via ORAL
  Filled 2014-05-21: qty 2
  Filled 2014-05-21: qty 1
  Filled 2014-05-21 (×2): qty 2
  Filled 2014-05-21: qty 1
  Filled 2014-05-21 (×3): qty 2

## 2014-05-21 MED ORDER — PROMETHAZINE HCL 25 MG/ML IJ SOLN: 6.2500 mg | INTRAMUSCULAR | Status: DC | PRN

## 2014-05-21 MED ORDER — LOSARTAN POTASSIUM 50 MG PO TABS: 100.0000 mg | ORAL_TABLET | Freq: Every morning | ORAL | Status: DC

## 2014-05-21 MED ORDER — HYDROMORPHONE HCL PF 1 MG/ML IJ SOLN
INTRAMUSCULAR | Status: AC
  Filled 2014-05-21: qty 1

## 2014-05-21 MED ORDER — SODIUM CHLORIDE 0.9 % IV SOLN
INTRAVENOUS | Status: DC
  Administered 2014-05-21 (×2): via INTRAVENOUS
  Administered 2014-05-22: 1000 mL via INTRAVENOUS

## 2014-05-21 MED ORDER — LACTATED RINGERS IV SOLN
INTRAVENOUS | Status: DC | PRN
  Administered 2014-05-21 (×3): via INTRAVENOUS

## 2014-05-21 MED ORDER — BUPIVACAINE HCL 0.25 % IJ SOLN
INTRAMUSCULAR | Status: DC | PRN
  Administered 2014-05-21: 30 mL

## 2014-05-21 MED ORDER — HYDROMORPHONE HCL PF 1 MG/ML IJ SOLN
INTRAMUSCULAR | Status: DC | PRN
  Administered 2014-05-21: 0.5 mg via INTRAVENOUS
  Administered 2014-05-21: 1 mg via INTRAVENOUS
  Administered 2014-05-21: 0.5 mg via INTRAVENOUS

## 2014-05-21 MED ORDER — PROPOFOL 10 MG/ML IV BOLUS
INTRAVENOUS | Status: DC | PRN
  Administered 2014-05-21: 200 mg via INTRAVENOUS

## 2014-05-21 MED ORDER — ACETAMINOPHEN 325 MG PO TABS: 650.0000 mg | ORAL_TABLET | Freq: Four times a day (QID) | ORAL | Status: DC | PRN

## 2014-05-21 MED ORDER — ONDANSETRON HCL 4 MG PO TABS: 4.0000 mg | ORAL_TABLET | Freq: Four times a day (QID) | ORAL | Status: DC | PRN

## 2014-05-21 MED ORDER — LIDOCAINE HCL (CARDIAC) 20 MG/ML IV SOLN
INTRAVENOUS | Status: DC | PRN
  Administered 2014-05-21: 50 mg via INTRAVENOUS

## 2014-05-21 MED ORDER — METHOCARBAMOL 500 MG PO TABS
500.0000 mg | ORAL_TABLET | Freq: Four times a day (QID) | ORAL | Status: DC | PRN
  Administered 2014-05-21 – 2014-05-23 (×4): 500 mg via ORAL
  Filled 2014-05-21 (×4): qty 1

## 2014-05-21 MED ORDER — METOCLOPRAMIDE HCL 5 MG/ML IJ SOLN: 5.0000 mg | Freq: Three times a day (TID) | INTRAMUSCULAR | Status: DC | PRN

## 2014-05-21 MED ORDER — NEOSTIGMINE METHYLSULFATE 10 MG/10ML IV SOLN
INTRAVENOUS | Status: DC | PRN
  Administered 2014-05-21: 3 mg via INTRAVENOUS

## 2014-05-21 MED ORDER — BUPIVACAINE LIPOSOME 1.3 % IJ SUSP
INTRAMUSCULAR | Status: DC | PRN
  Administered 2014-05-21: 20 mL

## 2014-05-21 MED ORDER — PREDNISONE 5 MG PO TABS
5.0000 mg | ORAL_TABLET | Freq: Every day | ORAL | Status: DC
  Filled 2014-05-21: qty 1

## 2014-05-21 MED ORDER — BISACODYL 10 MG RE SUPP: 10.0000 mg | Freq: Every day | RECTAL | Status: DC | PRN

## 2014-05-21 MED ORDER — ROCURONIUM BROMIDE 100 MG/10ML IV SOLN
INTRAVENOUS | Status: DC | PRN
  Administered 2014-05-21: 25 mg via INTRAVENOUS
  Administered 2014-05-21: 5 mg via INTRAVENOUS
  Administered 2014-05-21: 10 mg via INTRAVENOUS

## 2014-05-21 MED ORDER — LACTATED RINGERS IV SOLN: INTRAVENOUS | Status: DC

## 2014-05-21 MED ORDER — TRANEXAMIC ACID 100 MG/ML IV SOLN
1000.0000 mg | INTRAVENOUS | Status: AC
  Administered 2014-05-21: 1000 mg via INTRAVENOUS
  Filled 2014-05-21: qty 10

## 2014-05-21 MED ORDER — MIDAZOLAM HCL 2 MG/2ML IJ SOLN
INTRAMUSCULAR | Status: AC
  Filled 2014-05-21: qty 2

## 2014-05-21 MED ORDER — DOCUSATE SODIUM 100 MG PO CAPS
100.0000 mg | ORAL_CAPSULE | Freq: Two times a day (BID) | ORAL | Status: DC
  Administered 2014-05-21 – 2014-05-23 (×4): 100 mg via ORAL

## 2014-05-21 MED ORDER — FENTANYL CITRATE 0.05 MG/ML IJ SOLN
INTRAMUSCULAR | Status: AC
  Filled 2014-05-21: qty 2

## 2014-05-21 MED ORDER — SODIUM CHLORIDE 0.9 % IR SOLN
Status: DC | PRN
  Administered 2014-05-21 (×2): 1000 mL

## 2014-05-21 MED ORDER — METOCLOPRAMIDE HCL 10 MG PO TABS: 5.0000 mg | ORAL_TABLET | Freq: Three times a day (TID) | ORAL | Status: DC | PRN

## 2014-05-21 MED ORDER — SODIUM CHLORIDE 0.9 % IV SOLN: INTRAVENOUS | Status: DC

## 2014-05-21 MED ORDER — LOSARTAN POTASSIUM 50 MG PO TABS
100.0000 mg | ORAL_TABLET | Freq: Every day | ORAL | Status: DC
  Administered 2014-05-21 – 2014-05-23 (×3): 100 mg via ORAL
  Filled 2014-05-21 (×3): qty 2

## 2014-05-21 MED ORDER — 0.9 % SODIUM CHLORIDE (POUR BTL) OPTIME
TOPICAL | Status: DC | PRN
  Administered 2014-05-21: 1000 mL

## 2014-05-21 MED ORDER — CHLORHEXIDINE GLUCONATE 4 % EX LIQD: 60.0000 mL | Freq: Once | CUTANEOUS | Status: DC

## 2014-05-21 MED ORDER — SODIUM CHLORIDE 0.9 % IJ SOLN
INTRAMUSCULAR | Status: DC | PRN
  Administered 2014-05-21: 30 mL via INTRAVENOUS

## 2014-05-21 MED ORDER — CEFAZOLIN SODIUM-DEXTROSE 2-3 GM-% IV SOLR
2.0000 g | Freq: Four times a day (QID) | INTRAVENOUS | Status: AC
  Administered 2014-05-21 (×2): 2 g via INTRAVENOUS
  Filled 2014-05-21 (×2): qty 50

## 2014-05-21 MED ORDER — METHOCARBAMOL 1000 MG/10ML IJ SOLN
500.0000 mg | Freq: Four times a day (QID) | INTRAVENOUS | Status: DC | PRN
  Administered 2014-05-21: 500 mg via INTRAVENOUS
  Filled 2014-05-21: qty 5

## 2014-05-21 MED ORDER — PROPOFOL 10 MG/ML IV BOLUS
INTRAVENOUS | Status: AC
Start: 2014-05-21 — End: 2014-05-21
  Filled 2014-05-21: qty 20

## 2014-05-21 SURGICAL SUPPLY — 85 items
ADAPTER BOLT FEMORAL +2/-2 (Knees) ×2 IMPLANT
ADPR FEM +2/-2 OFST BOLT (Knees) ×1 IMPLANT
ADPR FEM 5D STRL KN PFC SGM (Orthopedic Implant) ×1 IMPLANT
AUG FEM 4 4 CMB LF KN POST (Knees) ×2 IMPLANT
AUG FEM DIST PFC 4 8 RT (Knees) ×2 IMPLANT
AUG FEM SZ4 8 STRL LF KN RT TI (Knees) ×2 IMPLANT
AUGMENT FEM DIST PFC 4 8 RT (Knees) IMPLANT
AUGMENT POSTERIOR PFC SZ4 4MM (Knees) IMPLANT
BAG SPEC THK2 15X12 ZIP CLS (MISCELLANEOUS) ×1
BAG ZIPLOCK 12X15 (MISCELLANEOUS) ×3 IMPLANT
BANDAGE ELASTIC 6 VELCRO ST LF (GAUZE/BANDAGES/DRESSINGS) ×3 IMPLANT
BANDAGE ESMARK 6X9 LF (GAUZE/BANDAGES/DRESSINGS) ×1 IMPLANT
BLADE SAG 18X100X1.27 (BLADE) ×3 IMPLANT
BLADE SAW SGTL 11.0X1.19X90.0M (BLADE) ×3 IMPLANT
BNDG CMPR 9X6 STRL LF SNTH (GAUZE/BANDAGES/DRESSINGS) ×1
BNDG ESMARK 6X9 LF (GAUZE/BANDAGES/DRESSINGS) ×3
BONE CEMENT GENTAMICIN (Cement) ×9 IMPLANT
BOWL SMART MIX CTS (DISPOSABLE) ×3 IMPLANT
CEMENT BONE GENTAMICIN 40 (Cement) IMPLANT
CEMENT RESTRICTOR DEPUY SZ 4 (Cement) ×2 IMPLANT
CLOSURE WOUND 1/2 X4 (GAUZE/BANDAGES/DRESSINGS) ×1
COMP FEM CEM RT SZ4 (Orthopedic Implant) ×3 IMPLANT
COMPONENT FEM CEM RT SZ4 (Orthopedic Implant) IMPLANT
CUFF TOURN SGL QUICK 34 (TOURNIQUET CUFF) ×3
CUFF TRNQT CYL 34X4X40X1 (TOURNIQUET CUFF) ×1 IMPLANT
DECANTER SPIKE VIAL GLASS SM (MISCELLANEOUS) ×3 IMPLANT
DISTAL AUGMENT (Knees) ×4 IMPLANT
DRAPE EXTREMITY T 121X128X90 (DRAPE) ×3 IMPLANT
DRAPE POUCH INSTRU U-SHP 10X18 (DRAPES) ×3 IMPLANT
DRAPE U-SHAPE 47X51 STRL (DRAPES) ×3 IMPLANT
DRSG ADAPTIC 3X8 NADH LF (GAUZE/BANDAGES/DRESSINGS) ×3 IMPLANT
DRSG PAD ABDOMINAL 8X10 ST (GAUZE/BANDAGES/DRESSINGS) ×3 IMPLANT
DURAPREP 26ML APPLICATOR (WOUND CARE) ×3 IMPLANT
ELECT REM PT RETURN 9FT ADLT (ELECTROSURGICAL) ×3
ELECTRODE REM PT RTRN 9FT ADLT (ELECTROSURGICAL) ×1 IMPLANT
EVACUATOR 1/8 PVC DRAIN (DRAIN) ×3 IMPLANT
FACESHIELD WRAPAROUND (MASK) ×15 IMPLANT
FACESHIELD WRAPAROUND OR TEAM (MASK) ×5 IMPLANT
FEMORAL ADAPTER (Orthopedic Implant) ×2 IMPLANT
GAUZE SPONGE 4X4 12PLY STRL (GAUZE/BANDAGES/DRESSINGS) ×3 IMPLANT
GLOVE BIO SURGEON STRL SZ7.5 (GLOVE) IMPLANT
GLOVE BIO SURGEON STRL SZ8 (GLOVE) ×3 IMPLANT
GLOVE BIOGEL PI IND STRL 6.5 (GLOVE) IMPLANT
GLOVE BIOGEL PI IND STRL 8 (GLOVE) ×1 IMPLANT
GLOVE BIOGEL PI INDICATOR 6.5 (GLOVE)
GLOVE BIOGEL PI INDICATOR 8 (GLOVE) ×2
GLOVE SURG SS PI 6.5 STRL IVOR (GLOVE) IMPLANT
GOWN STRL REUS W/TWL LRG LVL3 (GOWN DISPOSABLE) ×3 IMPLANT
GOWN STRL REUS W/TWL XL LVL3 (GOWN DISPOSABLE) IMPLANT
HANDPIECE INTERPULSE COAX TIP (DISPOSABLE) ×3
IMMOBILIZER KNEE 20 (SOFTGOODS) ×3
IMMOBILIZER KNEE 20 THIGH 36 (SOFTGOODS) ×1 IMPLANT
INSERT TIBAIL RP SZ4 22.5 KNEE (Insert) ×2 IMPLANT
KIT BASIN OR (CUSTOM PROCEDURE TRAY) ×3 IMPLANT
MANIFOLD NEPTUNE II (INSTRUMENTS) ×3 IMPLANT
NDL SAFETY ECLIPSE 18X1.5 (NEEDLE) ×2 IMPLANT
NEEDLE HYPO 18GX1.5 SHARP (NEEDLE) ×6
NS IRRIG 1000ML POUR BTL (IV SOLUTION) ×3 IMPLANT
PACK TOTAL JOINT (CUSTOM PROCEDURE TRAY) ×3 IMPLANT
PAD ABD 8X10 STRL (GAUZE/BANDAGES/DRESSINGS) ×2 IMPLANT
PADDING CAST COTTON 6X4 STRL (CAST SUPPLIES) ×6 IMPLANT
PATELLA DOME PFC 41MM (Knees) ×2 IMPLANT
POSITIONER SURGICAL ARM (MISCELLANEOUS) ×3 IMPLANT
POSTERIOR AUGMENT PFC SZ4 4MM (Knees) ×6 IMPLANT
SET HNDPC FAN SPRY TIP SCT (DISPOSABLE) ×1 IMPLANT
SLEEVE TIB MBT 53 (Knees) ×2 IMPLANT
STAPLER VISISTAT 35W (STAPLE) ×4 IMPLANT
STEM TIBIA PFC 13X30MM (Stem) ×2 IMPLANT
STEM UNIVERSAL REVISION 75X22 (Stem) ×2 IMPLANT
STRIP CLOSURE SKIN 1/2X4 (GAUZE/BANDAGES/DRESSINGS) ×3 IMPLANT
SUCTION FRAZIER 12FR DISP (SUCTIONS) ×3 IMPLANT
SUT ETHIBOND NAB CT1 #1 30IN (SUTURE) ×4 IMPLANT
SUT MNCRL AB 4-0 PS2 18 (SUTURE) ×3 IMPLANT
SUT VIC AB 2-0 CT1 27 (SUTURE) ×12
SUT VIC AB 2-0 CT1 TAPERPNT 27 (SUTURE) ×3 IMPLANT
SUT VLOC 180 0 24IN GS25 (SUTURE) ×3 IMPLANT
SYRINGE 20CC LL (MISCELLANEOUS) ×3 IMPLANT
SYRINGE 60CC LL (MISCELLANEOUS) ×3 IMPLANT
TOWEL OR 17X26 10 PK STRL BLUE (TOWEL DISPOSABLE) ×3 IMPLANT
TOWEL OR NON WOVEN STRL DISP B (DISPOSABLE) IMPLANT
TRAY FOLEY CATH 14FRSI W/METER (CATHETERS) ×1 IMPLANT
TRAY FOLEY METER SIL LF 16FR (CATHETERS) ×2 IMPLANT
TRAY TIBIAL MBT REVISION (Knees) ×2 IMPLANT
WATER STERILE IRR 1500ML POUR (IV SOLUTION) ×3 IMPLANT
WRAP KNEE MAXI GEL POST OP (GAUZE/BANDAGES/DRESSINGS) ×3 IMPLANT

## 2014-05-21 NOTE — Brief Op Note (Signed)
05/21/2014  9:54 AM  PATIENT:  [name]  53 y.o. 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  PROCEDURE:  Procedure(s): RIGHT KNEE ARTHROPLASTY REINPLANTATION (Right)  SURGEON:  Surgeon(s) and Role:    Loanne Drilling, MD - Primary  PHYSICIAN ASSISTANT:   ASSISTANTS: Avel Peace, PA-C   ANESTHESIA:   general  EBL:  Total I/O In: 2000 [I.V.:2000] Out: 300 [Urine:100; Blood:200]  BLOOD ADMINISTERED:none  DRAINS: (Medium) Hemovact drain(s) in the right knee with  Suction Open   LOCAL MEDICATIONS USED:  OTHER Exparel  COUNTS:  YES  TOURNIQUET:   Total Tourniquet Time Documented: Thigh (Right) - 62 minutes Thigh (Right) - 33 minutes Total: Thigh (Right) - 95 minutes   DICTATION: .Other Dictation: Dictation Number [id number]  PLAN OF CARE: Admit to inpatient   PATIENT DISPOSITION:  PACU - hemodynamically stable.

## 2014-05-21 NOTE — H&P (View-Only) (Signed)
[name] Married / Language: English / Race: White Male Date of Admission:  05/21/2014 Chief complaint: Right knee pain History of Present Illness The patient is a 53 year old male who comes in for a preoperative History and Physical. The patient is scheduled for a right reimplantation total knee to be performed by Dr. Frank V. Aluisio, MD at [facility] on 05/21/2014. The patient is a 53 year old male presenting for a post-operative visit. The patient comes in over 6 weeks out from right total knee arthroplasty (right knee arthroplasty resection with antibiotic spacer placement). The patient states that he/she is doing fair at this time. The pain is under fair control at this time and describe their pain as 3-4 / 10 on a 10 point pain scale. They are currently on Oxycodone (1 PO PRN and Robaxin 1 tab BID PRN) for their pain. The patient is currently doing home exercise program. He has competed the IV antibiotics. He feels as though the knee is doing better. He is now ready for the knee reimplantation procedure. They have been treated with an extened course of IV antibiotics via PICC line and has improved with regards to the knee. It is felt that they would benefit from undergoing total joint reimplanmtation. Risks and benefits of the procedure have been discussed with the patient and they elect to proceed with surgery. There are no active contraindications to surgery such as ongoing infection or rapidly progressive neurological disease.  Allergies Penicillin VK *PENICILLINS* Rash, Itching. Avelox ABC Pack *FLUOROQUINOLONES* Facial Flushing, Coughing Erythromycin *MACROLIDES* Itching, Rash. Sulfanilamide *CHEMICALS* Rash.  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 mother Chronic Obstructive Lung Disease father  Social History Illicit drug use no Alcohol use never consumed alcohol no Living situation live with spouse Current work status working full time Pain Contract no Number of flights of stairs before winded 1 Tobacco / smoke exposure no Drug/Alcohol Rehab (Previously) no Marital status married Tobacco use Former smoker. never smoker Children 1 Exercise Exercises rarely Drug/Alcohol Rehab (Currently) no  Medication History  OxyCODONE HCl (5MG Tablet, 1-2 Tablet Oral po q4-6h prn pain, Taken starting 03/23/2014) Active. PredniSONE (5MG Tablet, Oral) Active. Furosemide (40MG Tablet, Oral) Active. Metoprolol Succinate ER (50MG Tablet ER 24HR, Oral) Active. Aciphex (20MG Tablet DR, Oral) Active. Losartan Potassium (100MG Tablet, Oral) Active. Robaxin (500MG Tablet, Oral) Active. (PRN) Diltiazem HCl ER (240MG Capsule ER 24HR, Oral) Active.  Past Surgical History Total Knee Replacement bilateral Hip Fracture and Surgery bilateral Total Hip Replacement bilateral   Review of Systems General Not Present- Chills, Fatigue, Fever, Memory Loss, Night Sweats, Weight Gain and Weight Loss. Skin Not Present- Eczema, Hives, Itching, Lesions and Rash. HEENT Not Present- Dentures, Double Vision, Headache, Hearing Loss, Tinnitus and Visual Loss. Respiratory Not Present- Allergies, Chronic Cough, Coughing up blood, Shortness of breath at rest and Shortness of breath with exertion. Cardiovascular Not Present- Chest Pain, Difficulty Breathing Lying Down, Murmur, Palpitations,  Racing/skipping heartbeats and Swelling. Gastrointestinal Not Present- Abdominal Pain, Bloody Stool, Constipation, Diarrhea, Difficulty Swallowing, Heartburn, Jaundice, Loss of   appetitie, Nausea and Vomiting. Male Genitourinary Not Present- Blood in Urine, Discharge, Flank Pain, Incontinence, Painful Urination, Urgency, Urinary frequency, Urinary Retention, Urinating at Night and Weak urinary stream. Musculoskeletal Present- Joint Pain. Not Present- Back Pain, Joint Swelling, Morning Stiffness, Muscle Pain, Muscle Weakness and Spasms. Neurological Not Present- Blackout spells, Difficulty with balance, Dizziness, Paralysis, Tremor and Weakness. Psychiatric Not Present- Insomnia.   Vitals Weight: 288.7 lb Height: 68in Weight was reported by patient. Height was reported by patient. Body Surface Area: 2.51 m Body Mass Index: 43.9 kg/m BP: 114/66 (Sitting, Right Arm, Standard)   Physical Exam  General Mental Status -Alert, cooperative and good historian. General Appearance-pleasant, Not in acute distress. Orientation-Oriented X3. Build & Nutrition-Well nourished and Well developed.  Head and Neck Head-normocephalic, atraumatic . Neck Global Assessment - supple, no bruit auscultated on the right, no bruit auscultated on the left.  Eye Vision-Wears corrective lenses. Pupil - Bilateral-Regular and Round. Motion - Bilateral-EOMI.  Chest and Lung Exam Auscultation Breath sounds - clear at anterior chest wall and clear at posterior chest wall. Adventitious sounds - No Adventitious sounds.  Cardiovascular Auscultation Rhythm - Regular rate and rhythm. Heart Sounds - S1 WNL and S2 WNL. Murmurs & Other Heart Sounds: Murmur 1 - Location - Aortic Area. Timing - Early systolic. Grade - II/VI.  Abdomen Inspection Contour - Generalized moderate distention. Palpation/Percussion Tenderness - Abdomen is non-tender to palpation. Rigidity (guarding) - Abdomen is  soft. Auscultation Auscultation of the abdomen reveals - Bowel sounds normal.  Male Genitourinary Note: Not done, not pertinent to present illness   Musculoskeletal Note: On exam, he is alert and oriented in no apparent distress. His right knee shows a well healed incision. The swelling is essentially gone now. There is no erythema. His lower leg swelling has also improved tremendously.  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 Aluisio.  Plan is to go home.  PCP - Dr. Daniel Paterson  The patient does not have any contraindications and will receive TXA (tranexamic acid) prior to surgery.  Signed electronically by Alezandrew L Dijon Kohlman, III PA-C 

## 2014-05-21 NOTE — Transfer of Care (Signed)
Immediate Anesthesia Transfer of Care Note  Patient: [name]  Procedure(s) Performed: Procedure(s): RIGHT KNEE ARTHROPLASTY REINPLANTATION (Right)  Patient Location: PACU  Anesthesia Type:General  Level of Consciousness: awake, alert  and oriented  Airway & Oxygen Therapy: Patient Spontanous Breathing and Patient connected to face mask oxygen  Post-op Assessment: Report given to PACU RN and Post -op Vital signs reviewed and stable  Post vital signs: Reviewed and stable  Complications: No apparent anesthesia complications

## 2014-05-21 NOTE — Progress Notes (Signed)
CARE MANAGEMENT NOTE 05/21/2014  Patient:  [name], [name]   Account Number:  [id number]  Date Initiated:  05/21/2014  Documentation initiated by:  DAVIS,RHONDA  Subjective/Objective Assessment:   Resection arthroplasty, right knee, secondary to septic arthritis.  PROCEDURE:  Right total knee arthroplasty reimplantation.     Action/Plan:   home when stable with hhc/has dme from previous surgeries   Anticipated DC Date:  05/24/2014   Anticipated DC Plan:  HOME W HOME HEALTH SERVICES  In-house referral  NA      DC Planning Services  CM consult      [facility] Choice  NA   Choice offered to / List presented to:  C-1 Patient   DME arranged  NA      DME agency  NA     HH arranged  HH-2 PT      HH agency  [facility]   Status of service:  In process, will continue to follow Medicare Important Message given?  NA - LOS <3 / Initial given by admissions (If response is "NO", the following Medicare IM given date fields will be blank) Date Medicare IM given:   Medicare IM given by:   Date Additional Medicare IM given:   Additional Medicare IM given by:    Discharge Disposition:    Per UR Regulation:  Reviewed for med. necessity/level of care/duration of stay  If discussed at Long Length of Stay Meetings, dates discussed:    Comments:  [id number]/Rhonda Stark Jock, BSN, [place] [phone] Chart Reviewed for discharge and hospital needs. Discharge needs at time of review physical therapy at time of discharge in the home-arranged/

## 2014-05-21 NOTE — Progress Notes (Signed)
PT Cancellation Note  Patient Details Name: JERONIMO HELLBERG MRN: [id number] DOB: 07-20-1961   Cancelled Treatment:     POD 0 eval deferred at pt request 2* pain level.  Will follow in am.   Bridey Brookover 05/21/2014, 4:26 PM

## 2014-05-21 NOTE — Op Note (Signed)
[name], [name]                 ACCOUNT NO.:  [id number]  MEDICAL RECORD NO.:  [id number]  LOCATION:  [facility]                         FACILITY:  [facility]  PHYSICIAN:  Ollen Gross, M.D.    DATE OF BIRTH:  04-13-1961  DATE OF PROCEDURE:  05/21/2014 DATE OF DISCHARGE:                              OPERATIVE REPORT   PREOPERATIVE DIAGNOSIS:  Resection arthroplasty, right knee, secondary to septic arthritis.  POSTOPERATIVE DIAGNOSIS:  Resection arthroplasty, right knee, secondary to septic arthritis.  PROCEDURE:  Right total knee arthroplasty reimplantation.  SURGEON:  Ollen Gross, MD  ASSISTANT:  Alexzandrew L. Perkins, PA-C  ANESTHESIA:  General.  ESTIMATED BLOOD LOSS:  Minimal.  DRAINS:  Hemovac x1.  TOURNIQUET TIME:  Up 60 minutes at 300 mmHg, down 8 minutes, up additional 34 minutes at 300 mmHg.  COMPLICATIONS:  None.  CONDITION:  Stable to recovery.  BRIEF CLINICAL NOTE:  [name] is a 53 year old male who had an infected right total knee arthroplasty, treated with resection arthroplasty approximately 2 months ago.  He presents now for total knee arthroplasty, reimplantation.  PROCEDURE IN DETAIL:  After successful administration of general anesthetic, a tourniquet was placed high on his right thigh and right lower extremity was prepped and draped in the usual sterile fashion. Extremities wrapped in Esmarch, tourniquet inflated to 300 mmHg.  A midline incision was made with a 10 blade through the subcutaneous tissue to the level of the extensor mechanism.  A fresh blade was used to make a medial parapatellar arthrotomy.  There was a little hematoma there and that was evacuated.  The tissue and fluid were sent for stat Gram stain, which showed white cells, but no organisms.  We then removed the antibiotic spacer from the knee including the polyethylene for the articulating portion.  We thoroughly irrigated the joint down and removed any fibrous tissue and scar.  I  then started to prepare the tibia.  We reamed the tibia up to 13 mm and then placed the extramedullary tibial alignment guide referencing proximally at the medial aspect of the tibial tubercle and distally along the second metatarsal axis and tibial crest.  Block was pinned to remove about 3 or 4 mm anteriorly which I got about a mm posteriorly.  The resection was made with an oscillating saw.  Size 4 was the most appropriate tibia. We then prepared for a tibial sleeve and got up to 45 sleeve which had fantastic rotational and torsional stability.  We then addressed the femur.  I thoroughly irrigated the femoral canal and then reamed up to 22 mm which had an excellent press-fit in the canal.  The reamer was left in place to serve as our intramedullary cutting guide.  The distal femoral cutting block was placed to remove 2 mm of the distal femur.  We had some deficiency, distal femur so we decided to use 8 mm distal augments to bring them back out to a more normal joint line.  Size 4 was going to be the most appropriate femur and the size 4 cutting block was placed with 8 mm distal augments medial and lateral and we had marked our rotation off the epicondylar axis  and confirmed by placing a spacer block in 90 degrees of flexion to create a rectangular flexion gap.  Block was pinned in this position and then the anterior-posterior and chamfer cuts made.  He needed 4 mm posterior augments, medial and lateral in order to get contact with bone.  We then placed intercondylar block and made the intercondylar cut for the TC3 component.  The trials were then placed.  On the tibial side, it was a thick tibial tray which was the 2, 3, 4 taper which is 4 proximal and then 2 at the cut bone surface.  This was a 25 mm thick tray which had a 45 sleeve and then a 13 x 30 stem extension.  We placed this trial with excellent purchase.  On the femoral side, the trial was a size 4 TC3 femur with 8 mm distal  augments medial and lateral, 4 mm posterior augments medial and lateral and the stem was a 75 x 22 which was placed in the +2 position in 5 degrees of valgus.  The trials both had excellent fit on the bone surface.  The trial insert was placed and went to 22.5 mm with TC3 which allowed for full extension with excellent varus to valgus balance in full extension, excellent anterior-posterior balance in flexion.  The patella was then addressed and we cut the patella down to a thickness of 14 mm.  He previously had a 41 patella and then we placed a template for the 41 drilled lug holes, placed a trial patella and tracks normally.  The tourniquet was then released, first initial tourniquet time was 60 minutes.  We kept it down for 8 minutes while the components were assembled on the back table.  The minor bleeding was stopped with electrocautery, during that time, the tourniquet was down.  After 8 minutes, the components were ready and we rewrapped the leg in Esmarch reinflated the tourniquet to 300 mmHg.  We then removed the trials and sized the cement restrictor for size 4 and the cement restrictor and the tibia was placed in the appropriate depth in the tibial canal.  We then thoroughly irrigated the joint and the bone surfaces with pulsatile lavage with saline.  Cement was mixed and once ready for implantation, the cement was injected into the tibial canal and on the cut surface of the tibia.  We cemented the MBT revision tray which was the 25 mm thick tray with a 2, 3, 4 taper and a 45 sleeve with a 13 x 30 stem extension.  On the femoral side, we cemented distally, had the press-fit stem and both the femoral and tibial components were impacted, all extruded cement removed.  Femoral size was a size 4 TC3 femur with the 8 mm medial and lateral augments distally 4 mm medial and lateral augments posteriorly and the 22 x 75 stem extension in a +2 position with 5 degrees of right valgus.  The  trial inserts placed, knee held in full extension.  The rest of the extruded cement removed.  Patella 41 is then cemented into place and held with the patellar clamp.  Once the cement was hardened, the knee permanent 22.5 mm TC3 insert rotating platform was placed.  Wound was further irrigated with saline solution.  The arthrotomy was closed over Hemovac drain with #1 V-Loc suture and also with #1 Ethibond suture.  The tourniquet was released, second tourniquet time of 34 minutes.  Flexion against gravity is about 70 degrees at which point,  there is tension on the quad snip and we did not flex beyond 70.  The subcu was then closed over the second limb of the Hemovac drain with interrupted 2-0 Vicryl. The drains were hooked to suction and the incision was cleaned and dried and staples were applied.  We then placed a bulky sterile dressing and placed him into a knee immobilizer.  He subsequently awakened and transported to recovery in stable condition.  Note that, a surgical assistant was a medical necessity for this procedure to perform it in a safe and expeditious manner.  Surgical assistance necessary for retraction of vital neurovascular structures and vital ligaments.  Also a central for proper positioning the limb to allow for proper placement of the prosthesis and also for proper alignment. Also note that we had injected a total of 20 mL of Exparel with 40 mL of saline into the extensor mechanism and periosteum of the femur prior to closure and also an additional 20 mL of 0.25% Marcaine into the same tissues prior to closure.     Ollen Gross, M.D.     FA/MEDQ  D:  05/21/2014  T:  05/21/2014  Job:  [id number]

## 2014-05-21 NOTE — Interval H&P Note (Signed)
History and Physical Interval Note:  05/21/2014 7:13 AM  [name]  has presented today for surgery, with the diagnosis of RESECTION ARTHROPLASTY RIGHT KNEE SECONDARY TO SEPTIC ARTHRITIS  The various methods of treatment have been discussed with the patient and family. After consideration of risks, benefits and other options for treatment, the patient has consented to  Procedure(s): RIGHT KNEE ARTHROPLASTY REINPLANTATION (Right) as a surgical intervention .  The patient's history has been reviewed, patient examined, no change in status, stable for surgery.  I have reviewed the patient's chart and labs.  Questions were answered to the patient's satisfaction.     Loanne Drilling

## 2014-05-21 NOTE — Plan of Care (Signed)
Problem: Phase I Progression Outcomes Goal: Dangle or out of bed evening of surgery Outcome: Not Met (add Reason) refused     

## 2014-05-21 NOTE — Anesthesia Postprocedure Evaluation (Signed)
  Anesthesia Post-op Note  Patient: [name]  Procedure(s) Performed: Procedure(s) (LRB): RIGHT KNEE ARTHROPLASTY REINPLANTATION (Right)  Patient Location: PACU  Anesthesia Type: General  Level of Consciousness: awake and alert   Airway and Oxygen Therapy: Patient Spontanous Breathing  Post-op Pain: mild  Post-op Assessment: Post-op Vital signs reviewed, Patient's Cardiovascular Status Stable, Respiratory Function Stable, Patent Airway and No signs of Nausea or vomiting  Last Vitals:  Filed Vitals:   05/21/14 1217  BP: 152/85  Pulse: 55  Temp: 36.3 C  Resp: 14    Post-op Vital Signs: stable   Complications: No apparent anesthesia complications. To floor on OSA precautions. No apnea nor resedation in PACU.

## 2014-05-22 LAB — BASIC METABOLIC PANEL
ANION GAP: 7 (ref 5–15)
BUN: 13 mg/dL (ref 6–23)
CALCIUM: 8.9 mg/dL (ref 8.4–10.5)
CO2: 27 mEq/L (ref 19–32)
Chloride: 105 mEq/L (ref 96–112)
Creatinine, Ser: 0.86 mg/dL (ref 0.50–1.35)
GFR calc non Af Amer: >90 mL/min (ref >90)
Glucose, Bld: 153 mg/dL — ABNORMAL HIGH (ref 70–99)
Potassium: 4.5 mEq/L (ref 3.7–5.3)
Sodium: 139 mEq/L (ref 137–147)

## 2014-05-22 LAB — CBC
HCT: 31.6 % — ABNORMAL LOW (ref 39.0–52.0)
Hemoglobin: 9.8 g/dL — ABNORMAL LOW (ref 13.0–17.0)
MCH: 25.5 pg — ABNORMAL LOW (ref 26.0–34.0)
MCHC: 31 g/dL (ref 30.0–36.0)
MCV: 82.1 fL (ref 78.0–100.0)
PLATELETS: 225 10*3/uL (ref 150–400)
RBC: 3.85 MIL/uL — ABNORMAL LOW (ref 4.22–5.81)
RDW: 13.7 % (ref 11.5–15.5)
WBC: 11.3 10*3/uL — AB (ref 4.0–10.5)

## 2014-05-22 NOTE — Plan of Care (Signed)
Problem: Phase I Progression Outcomes Goal: Dangle or out of bed evening of surgery Outcome: Completed/Met Date Met:  05/22/14 Completed 8/8 am, patient refused to dangle Friday pm (per Patient)

## 2014-05-22 NOTE — Progress Notes (Signed)
OT Cancellation Note  Patient Details Name: MANSOOR HILLYARD MRN: [id number] DOB: 24-Mar-1961   Cancelled Treatment:    Reason Eval/Treat Not Completed: OT screened, no needs identified, will sign off.  Pt has had multiple orthopedic surgeries and has no OT needs at this time.    Kendelle Schweers 05/22/2014, 11:57 AM Marica Otter, OTR/L [phone] 05/22/2014

## 2014-05-22 NOTE — Evaluation (Signed)
Physical Therapy Evaluation Patient Details Name: [name] MRN: [id number] DOB: 1961-02-09 Today's Date: 05/22/2014   History of Present Illness  Reimplantation R TKR  Clinical Impression  Pt s/p R TKR presents with decreased R LE strength/ROM and post op pain limiting functional mobility.  Pt should progress well to d/c home with family assist and HHPT follow up.    Follow Up Recommendations Home health PT    Equipment Recommendations  None recommended by PT    Recommendations for Other Services OT consult     Precautions / Restrictions Precautions Precautions: Knee;Fall Precaution Comments: Pt states that Dr Despina Hick does not want him to push knee flexion Required Braces or Orthoses: Knee Immobilizer - Right Knee Immobilizer - Right: Discontinue once straight leg raise with < 10 degree lag Restrictions Weight Bearing Restrictions: No Other Position/Activity Restrictions: WBAT      Mobility  Bed Mobility Overal bed mobility: Needs Assistance Bed Mobility: Supine to Sit     Supine to sit: Min assist;HOB elevated     General bed mobility comments: cues for sequence with assist for R LE   Transfers Overall transfer level: Needs assistance Equipment used: Rolling walker (2 wheeled) Transfers: Sit to/from Stand Sit to Stand: From elevated surface;Min assist;Mod assist         General transfer comment: cues for LE management and use of UEs to self assist  Ambulation/Gait Ambulation/Gait assistance: Min assist;Mod assist Ambulation Distance (Feet): 37 Feet Assistive device: Rolling walker (2 wheeled) Gait Pattern/deviations: Step-to pattern;Decreased step length - right;Decreased step length - left;Shuffle;Antalgic;Trunk flexed     General Gait Details: cues for posture, position from RW and sequence.  Stairs            Wheelchair Mobility    Modified Rankin (Stroke Patients Only)       Balance                                             Pertinent Vitals/Pain Pain Assessment: 0-10 Pain Score: 4  Pain Location: R knee Pain Descriptors / Indicators: Aching Pain Intervention(s): Limited activity within patient's tolerance;Premedicated before session;Ice applied    Home Living Family/patient expects to be discharged to:: Private residence Living Arrangements: Spouse/significant other Available Help at Discharge: Family Type of Home: House Home Access: Ramped entrance     Home Layout: One level Home Equipment: Environmental consultant - 2 wheels;Crutches;Bedside commode      Prior Function Level of Independence: Independent;Independent with assistive device(s)               Hand Dominance        Extremity/Trunk Assessment   Upper Extremity Assessment: RUE deficits/detail;LUE deficits/detail RUE Deficits / Details: ltd wrist flex/ext 2* RA changes     LUE Deficits / Details: Ltd L wrist flex/ext 2* RA changes   Lower Extremity Assessment: RLE deficits/detail RLE Deficits / Details: 2/5 quads with AAROM at knee -8 - 20       Communication   Communication: No difficulties  Cognition Arousal/Alertness: Awake/alert Behavior During Therapy: WFL for tasks assessed/performed Overall Cognitive Status: Within Functional Limits for tasks assessed                      General Comments      Exercises Total Joint Exercises Ankle Circles/Pumps: AROM;Both;15 reps;Supine Quad Sets: AROM;Both;10 reps;Supine Heel Slides: AAROM;10 reps;Right;Supine  Straight Leg Raises: AAROM;Right;10 reps;Supine      Assessment/Plan    PT Assessment Patient needs continued PT services  PT Diagnosis Difficulty walking   PT Problem List Decreased strength;Decreased range of motion;Decreased activity tolerance;Decreased mobility;Decreased knowledge of use of DME;Obesity;Pain  PT Treatment Interventions DME instruction;Gait training;Stair training;Functional mobility training;Therapeutic activities;Therapeutic  exercise;Patient/family education   PT Goals (Current goals can be found in the Care Plan section) Acute Rehab PT Goals 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  PT Goal Formulation: With patient Time For Goal Achievement: 05/28/14 Potential to Achieve Goals: Good    Frequency 7X/week   Barriers to discharge        Co-evaluation               End of Session Equipment Utilized During Treatment: Gait belt;Right knee immobilizer Activity Tolerance: Patient tolerated treatment well Patient left: in chair;with call bell/phone within reach Nurse Communication: Mobility status         Time: 1024-1106 PT Time Calculation (min): 42 min   Charges:   PT Evaluation $Initial PT Evaluation Tier I: 1 Procedure PT Treatments $Gait Training: 8-22 mins $Therapeutic Exercise: 8-22 mins   PT G Codes:          Dawnell Bryant 05/22/2014, 12:57 PM

## 2014-05-22 NOTE — Progress Notes (Addendum)
   Subjective: 1 Day Post-Op Procedure(s) (LRB): RIGHT KNEE ARTHROPLASTY REINPLANTATION (Right) Patient reports pain as mild.   Patient seen in rounds with Dr. Lequita Halt.  Had a decent night. Patient is well, but has had some minor complaints of pain in the knee, requiring pain medications We will start therapy today.  Plan is to go Home after hospital stay.  Objective: Vital signs in last 24 hours: Temp:  [97.4 F (36.3 C)-98.5 F (36.9 C)] 97.8 F (36.6 C) (08/08 0559) Pulse Rate:  [54-69] 69 (08/08 0559) Resp:  [12-21] 19 (08/08 0559) BP: (120-160)/(58-85) 120/75 mmHg (08/08 0559) SpO2:  [95 %-100 %] 100 % (08/08 0559) Weight:  [130.636 kg (288 lb)] 130.636 kg (288 lb) (08/07 1123)  Intake/Output from previous day:  Intake/Output Summary (Last 24 hours) at 05/22/14 0738 Last data filed at 05/22/14 0603  Gross per 24 hour  Intake   3920 ml  Output   3065 ml  Net    855 ml    Intake/Output this shift:    Labs:  Recent Labs  05/22/14 0500  HGB 9.8*    Recent Labs  05/22/14 0500  WBC 11.3*  RBC 3.85*  HCT 31.6*  PLT 225    Recent Labs  05/22/14 0500  NA 139  K 4.5  CL 105  CO2 27  BUN 13  CREATININE 0.86  GLUCOSE 153*  CALCIUM 8.9   No results found for this basename: LABPT, INR,  in the last 72 hours  EXAM General - Patient is Alert, Appropriate and Oriented Extremity - Neurovascular intact Sensation intact distally Dorsiflexion/Plantar flexion intact Dressing - dressing C/D/I Motor Function - intact, moving foot and toes well on exam.  Hemovac pulled without difficulty.  Past Medical History  Diagnosis Date  . Asthma   . GERD (gastroesophageal reflux disease)   . Hypertension   . OSA (obstructive sleep apnea)     "quit wearing mask several years ago" (02/11/2014)  . Rheumatoid arthritis dx'd ~ 1977  . Cellulitis of lower extremity     "usually RLL; this time it's got up to my lower abdoment" (02/11/2014)-series of antibiotics completed  02-28-14  . S/P PICC central line placement 03-16-14    right upper arm remains intact.05-11-14 removed 1 week ago.    Assessment/Plan: 1 Day Post-Op Procedure(s) (LRB): RIGHT KNEE ARTHROPLASTY REINPLANTATION (Right) Active Problems:   OA (osteoarthritis) of knee  Estimated body mass index is 43.8 kg/(m^2) as calculated from the following:   Height as of this encounter: 5\' 8"  (1.727 m).   Weight as of this encounter: 130.636 kg (288 lb). Advance diet Up with therapy Discharge home with home health  DVT Prophylaxis - Xarelto Weight-Bearing as tolerated to right leg D/C O2 and Pulse OX and try on Room Air Placed on a prednisone taper.  Patient is on diltiazem so will watch for bleeding while on low dose Xarelto.  , PA-C Orthopaedic Surgery 05/22/2014, 7:38 AM

## 2014-05-22 NOTE — Progress Notes (Signed)
Physical Therapy Treatment Patient Details Name: [name] MRN: [id number] DOB: December 18, 1960 Today's Date: Jun 06, 2014    History of Present Illness Reimplantation R TKR    PT Comments    Progressing well and hopeful for d/c in am.  Follow Up Recommendations  Home health PT     Equipment Recommendations  None recommended by PT    Recommendations for Other Services OT consult     Precautions / Restrictions Precautions Precautions: Knee;Fall Required Braces or Orthoses: Knee Immobilizer - Right Knee Immobilizer - Right: Discontinue once straight leg raise with < 10 degree lag Restrictions Weight Bearing Restrictions: No Other Position/Activity Restrictions: WBAT    Mobility  Bed Mobility                  Transfers Overall transfer level: Needs assistance Equipment used: Rolling walker (2 wheeled) Transfers: Sit to/from Stand Sit to Stand: Min assist         General transfer comment: cues for LE management and use of UEs to self assist  Ambulation/Gait Ambulation/Gait assistance: Min assist Ambulation Distance (Feet): 94 Feet Assistive device: Rolling walker (2 wheeled) Gait Pattern/deviations: Step-to pattern;Shuffle;Antalgic;Trunk flexed     General Gait Details: cues for posture, position from RW and sequence.   Stairs            Wheelchair Mobility    Modified Rankin (Stroke Patients Only)       Balance                                    Cognition Arousal/Alertness: Awake/alert Behavior During Therapy: WFL for tasks assessed/performed Overall Cognitive Status: Within Functional Limits for tasks assessed                      Exercises      General Comments        Pertinent Vitals/Pain Pain Assessment: 0-10 Pain Score: 6  Pain Location: R knee Pain Descriptors / Indicators: Aching Pain Intervention(s): Limited activity within patient's tolerance;Premedicated before session;Ice applied    Home  Living                      Prior Function            PT Goals (current goals can now be found in the care plan section) Acute Rehab PT Goals 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  PT Goal Formulation: With patient Time For Goal Achievement: 05/28/14 Potential to Achieve Goals: Good Progress towards PT goals: Progressing toward goals    Frequency  7X/week    PT Plan Current plan remains appropriate    Co-evaluation             End of Session Equipment Utilized During Treatment: Gait belt;Right knee immobilizer Activity Tolerance: Patient tolerated treatment well Patient left: in chair;with call bell/phone within reach     Time: 1424-1444 PT Time Calculation (min): 20 min  Charges:  $Gait Training: 8-22 mins                    G Codes:      Okie Jansson 06-06-14, 2:46 PM

## 2014-05-22 NOTE — Discharge Instructions (Addendum)
° °Dr. Frank Aluisio °Total Joint Specialist °[facility] °[address] °[place], [place] [address] °[phone] ° °TOTAL KNEE REPLACEMENT POSTOPERATIVE DIRECTIONS ° ° ° °Knee Rehabilitation, Guidelines Following Surgery  °Results after knee surgery are often greatly improved when you follow the exercise, range of motion and muscle strengthening exercises prescribed by your doctor. Safety measures are also important to protect the knee from further injury. Any time any of these exercises cause you to have increased pain or swelling in your knee joint, decrease the amount until you are comfortable again and slowly increase them. If you have problems or questions, call your caregiver or physical therapist for advice.  ° °HOME CARE INSTRUCTIONS  °Remove items at home which could result in a fall. This includes throw rugs or furniture in walking pathways.  °Continue medications as instructed at time of discharge. °You may have some home medications which will be placed on hold until you complete the course of blood thinner medication.  °You may start showering once you are discharged home but do not submerge the incision under water. Just pat the incision dry and apply a dry gauze dressing on daily. °Walk with walker as instructed.  °You may resume a sexual relationship in one month or when given the OK by  your doctor.  °· Use walker as long as suggested by your caregivers. °· Avoid periods of inactivity such as sitting longer than an hour when not asleep. This helps prevent blood clots.  °You may put full weight on your legs and walk as much as is comfortable.  °You may return to work once you are cleared by your doctor.  °Do not drive a car for 6 weeks or until released by you surgeon.  °· Do not drive while taking narcotics.  °Wear the elastic stockings for three weeks following surgery during the day but you may remove then at night. °Make sure you keep all of your appointments after your  operation with all of your doctors and caregivers. You should call the office at the above phone number and make an appointment for approximately two weeks after the date of your surgery. °Change the dressing daily and reapply a dry dressing each time. °Please pick up a stool softener and laxative for home use as long as you are requiring pain medications. °· Continue to use ice on the knee for pain and swelling from surgery. You may notice swelling that will progress down to the foot and ankle.  This is normal after surgery.  Elevate the leg when you are not up walking on it.   °It is important for you to complete the blood thinner medication as prescribed by your doctor. °· Continue to use the breathing machine which will help keep your temperature down.  It is common for your temperature to cycle up and down following surgery, especially at night when you are not up moving around and exerting yourself.  The breathing machine keeps your lungs expanded and your temperature down. ° °RANGE OF MOTION AND STRENGTHENING EXERCISES  °Rehabilitation of the knee is important following a knee injury or an operation. After just a few days of immobilization, the muscles of the thigh which control the knee become weakened and shrink (atrophy). Knee exercises are designed to build up the tone and strength of the thigh muscles and to improve knee motion. Often times heat used for twenty to thirty minutes before working out will loosen up your tissues and help with improving the   range of motion but do not use heat for the first two weeks following surgery. These exercises can be done on a training (exercise) mat, on the floor, on a table or on a bed. Use what ever works the best and is most comfortable for you Knee exercises include:  Leg Lifts - While your knee is still immobilized in a splint or cast, you can do straight leg raises. Lift the leg to 60 degrees, hold for 3 sec, and slowly lower the leg. Repeat 10-20 times 2-3  times daily. Perform this exercise against resistance later as your knee gets better.  Quad and Hamstring Sets - Tighten up the muscle on the front of the thigh (Quad) and hold for 5-10 sec. Repeat this 10-20 times hourly. Hamstring sets are done by pushing the foot backward against an object and holding for 5-10 sec. Repeat as with quad sets.  A rehabilitation program following serious knee injuries can speed recovery and prevent re-injury in the future due to weakened muscles. Contact your doctor or a physical therapist for more information on knee rehabilitation.   SKILLED REHAB INSTRUCTIONS: If the patient is transferred to a skilled rehab facility following release from the hospital, a list of the current medications will be sent to the facility for the patient to continue.  When discharged from the skilled rehab facility, please have the facility set up the patient's Home Health Physical Therapy prior to being released. Also, the skilled facility will be responsible for providing the patient with their medications at time of release from the facility to include their pain medication, the muscle relaxants, and their blood thinner medication. If the patient is still at the rehab facility at time of the two week follow up appointment, the skilled rehab facility will also need to assist the patient in arranging follow up appointment in our office and any transportation needs.  MAKE SURE YOU:  Understand these instructions.  Will watch your condition.  Will get help right away if you are not doing well or get worse.    Pick up stool softner and laxative for home. Do not submerge incision under water. May shower. Continue to use ice for pain and swelling from surgery.  Home Health PT - Maximum of 50 degrees of flexion the first two weeks.  Take Xarelto for two and a half more weeks, then discontinue Xarelto. Once the patient has completed the blood thinner regimen, then take a Baby 81 mg Aspirin  daily for three more weeks.  Information on my medicine - XARELTO (Rivaroxaban)  This medication education was reviewed with me or my healthcare representative as part of my discharge preparation.  The pharmacist that spoke with me during my hospital stay was:  Otho Bellows, [facility]  Why was Xarelto prescribed for you? Xarelto was prescribed for you to reduce the risk of blood clots forming after orthopedic surgery. The medical term for these abnormal blood clots is venous thromboembolism (VTE).  What do you need to know about xarelto ? Take your Xarelto ONCE DAILY at the same time every day. You may take it either with or without food.  If you have difficulty swallowing the tablet whole, you may crush it and mix in applesauce just prior to taking your dose.  Take Xarelto exactly as prescribed by your doctor and DO NOT stop taking Xarelto without talking to the doctor who prescribed the medication.  Stopping without other VTE prevention medication to take the place of Xarelto may increase  your risk of developing a clot.  After discharge, you should have regular check-up appointments with your healthcare provider that is prescribing your Xarelto.    What do you do if you miss a dose? If you miss a dose, take it as soon as you remember on the same day then continue your regularly scheduled once daily regimen the next day. Do not take two doses of Xarelto on the same day.   Important Safety Information A possible side effect of Xarelto is bleeding. You should call your healthcare provider right away if you experience any of the following:   Bleeding from an injury or your nose that does not stop.   Unusual colored urine (red or dark brown) or unusual colored stools (red or black).   Unusual bruising for unknown reasons.   A serious fall or if you hit your head (even if there is no bleeding).  Some medicines may interact with Xarelto and might increase your risk of bleeding while  on Xarelto. To help avoid this, consult your healthcare provider or pharmacist prior to using any new prescription or non-prescription medications, including herbals, vitamins, non-steroidal anti-inflammatory drugs (NSAIDs) and supplements.  This website has more information on Xarelto: [email or web address].com.br.

## 2014-05-23 LAB — BASIC METABOLIC PANEL
ANION GAP: 8 (ref 5–15)
BUN: 17 mg/dL (ref 6–23)
CALCIUM: 9 mg/dL (ref 8.4–10.5)
CO2: 28 mEq/L (ref 19–32)
Chloride: 104 mEq/L (ref 96–112)
Creatinine, Ser: 0.97 mg/dL (ref 0.50–1.35)
GLUCOSE: 159 mg/dL — AB (ref 70–99)
POTASSIUM: 4.4 meq/L (ref 3.7–5.3)
SODIUM: 140 meq/L (ref 137–147)

## 2014-05-23 LAB — CBC
HCT: 29.9 % — ABNORMAL LOW (ref 39.0–52.0)
Hemoglobin: 9.1 g/dL — ABNORMAL LOW (ref 13.0–17.0)
MCH: 25.3 pg — ABNORMAL LOW (ref 26.0–34.0)
MCHC: 30.4 g/dL (ref 30.0–36.0)
MCV: 83.3 fL (ref 78.0–100.0)
PLATELETS: 256 10*3/uL (ref 150–400)
RBC: 3.59 MIL/uL — ABNORMAL LOW (ref 4.22–5.81)
RDW: 13.8 % (ref 11.5–15.5)
WBC: 12.9 10*3/uL — ABNORMAL HIGH (ref 4.0–10.5)

## 2014-05-23 MED ORDER — TRAMADOL HCL 50 MG PO TABS: 50.0000 mg | ORAL_TABLET | Freq: Four times a day (QID) | ORAL | Status: DC | PRN

## 2014-05-23 MED ORDER — METHOCARBAMOL 500 MG PO TABS: 500.0000 mg | ORAL_TABLET | Freq: Four times a day (QID) | ORAL | Status: DC | PRN

## 2014-05-23 MED ORDER — RIVAROXABAN 10 MG PO TABS: 10.0000 mg | ORAL_TABLET | Freq: Every day | ORAL | Status: DC

## 2014-05-23 MED ORDER — OXYCODONE HCL 5 MG PO TABS: 5.0000 mg | ORAL_TABLET | ORAL | Status: DC | PRN

## 2014-05-23 NOTE — Discharge Summary (Signed)
Physician Discharge Summary   Patient ID: [name] MRN: [id number] DOB/AGE: 01/02/1961 53 y.o.  Admit date: 05/21/2014 Discharge date: 05/23/2014  Primary Diagnosis:  Resection arthroplasty, right knee, secondary  to septic arthritis.  Admission Diagnoses:  Past Medical History  Diagnosis Date  . Asthma   . GERD (gastroesophageal reflux disease)   . Hypertension   . OSA (obstructive sleep apnea)     "quit wearing mask several years ago" (02/11/2014)  . Rheumatoid arthritis dx'd ~ 1977  . Cellulitis of lower extremity     "usually RLL; this time it's got up to my lower abdoment" (02/11/2014)-series of antibiotics completed 02-28-14  . S/P PICC central line placement 03-16-14    right upper arm remains intact.05-11-14 removed 1 week ago.   Discharge Diagnoses:   Active Problems:   OA (osteoarthritis) of knee  Estimated body mass index is 43.8 kg/(m^2) as calculated from the following:   Height as of this encounter: $RemoveBeforeD'5\' 8"'[id number]$  (1.727 m).   Weight as of this encounter: 130.636 kg (288 lb).  Procedure:  Procedure(s) (LRB): RIGHT KNEE ARTHROPLASTY REINPLANTATION (Right)   Consults: None  HPI: [name] is a 53 year old male who had an infected  right total knee arthroplasty, treated with resection arthroplasty  approximately 2 months ago. He presents now for total knee  arthroplasty, reimplantation.  Laboratory Data: Admission on 05/21/2014  Component Date Value Ref Range Status  . ABO/RH(D) 05/21/2014 O POS   Final  . Antibody Screen 05/21/2014 NEG   Final  . Sample Expiration 05/21/2014 05/24/2014   Final  . Specimen Description 05/21/2014 FLUID RIGHT KNEE   Final  . Special Requests 05/21/2014 NONE   Final  . Gram Stain 05/21/2014    Final                   Value:ABUNDANT WBC PRESENT,BOTH PMN AND MONONUCLEAR                         NO ORGANISMS SEEN                         Gram Stain Report Called to,Read Back By and Verified With: COTTA,P. RN AT [id number] 05/21/14 BARFIELD,T  .  Report Status 05/21/2014 05/21/2014 FINAL   Final  . Specimen Description 05/21/2014 FLUID RIGHT KNEE   Final  . Special Requests 05/21/2014 NONE   Final  . Gram Stain 05/21/2014    Final                   Value:ABUNDANT WBC PRESENT,BOTH PMN AND MONONUCLEAR                         NO ORGANISMS SEEN                         Gram Stain Report Called to,Read Back By and Verified With: Gram Stain Report Called to,Read Back By and Verified With: COTTA P RN [phone] 05/21/14 BY HARTFIELD T Performed by [facility]                         Performed at [facility]  . Culture 05/21/2014    Final                   Value:NO GROWTH 1 DAY  Performed at [facility]  . Report Status 05/21/2014 PENDING   Incomplete  . Specimen Description 05/21/2014 FLUID RIGHT KNEE   Final  . Special Requests 05/21/2014 NONE   Final  . Gram Stain 05/21/2014 PENDING   Incomplete  . Culture 05/21/2014    Final                   Value:NO ANAEROBES ISOLATED; CULTURE IN PROGRESS FOR 5 DAYS                         Performed at [facility]  . Report Status 05/21/2014 PENDING   Incomplete  . WBC 05/22/2014 11.3* 4.0 - 10.5 K/uL Final  . RBC 05/22/2014 3.85* 4.22 - 5.81 MIL/uL Final  . Hemoglobin 05/22/2014 9.8* 13.0 - 17.0 g/dL Final  . HCT 05/22/2014 31.6* 39.0 - 52.0 % Final  . MCV 05/22/2014 82.1  78.0 - 100.0 fL Final  . MCH 05/22/2014 25.5* 26.0 - 34.0 pg Final  . MCHC 05/22/2014 31.0  30.0 - 36.0 g/dL Final  . RDW 05/22/2014 13.7  11.5 - 15.5 % Final  . Platelets 05/22/2014 225  150 - 400 K/uL Final  . Sodium 05/22/2014 139  137 - 147 mEq/L Final  . Potassium 05/22/2014 4.5  3.7 - 5.3 mEq/L Final  . Chloride 05/22/2014 105  96 - 112 mEq/L Final  . CO2 05/22/2014 27  19 - 32 mEq/L Final  . Glucose, Bld 05/22/2014 153* 70 - 99 mg/dL Final  . BUN 05/22/2014 13  6 - 23 mg/dL Final  . Creatinine, Ser 05/22/2014 0.86  0.50 - 1.35 mg/dL Final  . Calcium 05/22/2014 8.9   8.4 - 10.5 mg/dL Final  . GFR calc non Af Amer 05/22/2014 >90  >90 mL/min Final  . GFR calc Af Amer 05/22/2014 >90  >90 mL/min Final   Comment: (NOTE)                          The eGFR has been calculated using the CKD EPI equation.                          This calculation has not been validated in all clinical situations.                          eGFR's persistently <90 mL/min signify possible Chronic Kidney                          Disease.  . Anion gap 05/22/2014 7  5 - 15 Final  . WBC 05/23/2014 12.9* 4.0 - 10.5 K/uL Final  . RBC 05/23/2014 3.59* 4.22 - 5.81 MIL/uL Final  . Hemoglobin 05/23/2014 9.1* 13.0 - 17.0 g/dL Final  . HCT 05/23/2014 29.9* 39.0 - 52.0 % Final  . MCV 05/23/2014 83.3  78.0 - 100.0 fL Final  . MCH 05/23/2014 25.3* 26.0 - 34.0 pg Final  . MCHC 05/23/2014 30.4  30.0 - 36.0 g/dL Final  . RDW 05/23/2014 13.8  11.5 - 15.5 % Final  . Platelets 05/23/2014 256  150 - 400 K/uL Final  . Sodium 05/23/2014 140  137 - 147 mEq/L Final  . Potassium 05/23/2014 4.4  3.7 - 5.3 mEq/L Final  . Chloride 05/23/2014 104  96 - 112 mEq/L Final  . CO2 05/23/2014  28  19 - 32 mEq/L Final  . Glucose, Bld 05/23/2014 159* 70 - 99 mg/dL Final  . BUN 05/23/2014 17  6 - 23 mg/dL Final  . Creatinine, Ser 05/23/2014 0.97  0.50 - 1.35 mg/dL Final  . Calcium 05/23/2014 9.0  8.4 - 10.5 mg/dL Final  . GFR calc non Af Amer 05/23/2014 >90  >90 mL/min Final  . GFR calc Af Amer 05/23/2014 >90  >90 mL/min Final   Comment: (NOTE)                          The eGFR has been calculated using the CKD EPI equation.                          This calculation has not been validated in all clinical situations.                          eGFR's persistently <90 mL/min signify possible Chronic Kidney                          Disease.  Georgiann Hahn gap 05/23/2014 8  5 - 15 Final  Hospital Outpatient Visit on 05/11/2014  Component Date Value Ref Range Status  . MRSA, PCR 05/11/2014 NEGATIVE  NEGATIVE Final  .  Staphylococcus aureus 05/11/2014 NEGATIVE  NEGATIVE Final   Comment:                                 The Xpert SA Assay (FDA                          approved for NASAL specimens                          in patients over 10 years of age),                          is one component of                          a comprehensive surveillance                          program.  Test performance has                          been validated by [employer] for patients greater                          than or equal to 56 year old.                          It is not intended                          to diagnose infection nor to  guide or monitor treatment.  Marland Kitchen aPTT 05/11/2014 28  24 - 37 seconds Final  . WBC 05/11/2014 10.0  4.0 - 10.5 K/uL Final  . RBC 05/11/2014 4.74  4.22 - 5.81 MIL/uL Final  . Hemoglobin 05/11/2014 12.5* 13.0 - 17.0 g/dL Final  . HCT 05/11/2014 39.1  39.0 - 52.0 % Final  . MCV 05/11/2014 82.5  78.0 - 100.0 fL Final  . MCH 05/11/2014 26.4  26.0 - 34.0 pg Final  . MCHC 05/11/2014 32.0  30.0 - 36.0 g/dL Final  . RDW 05/11/2014 14.4  11.5 - 15.5 % Final  . Platelets 05/11/2014 302  150 - 400 K/uL Final  . Sodium 05/11/2014 137  137 - 147 mEq/L Final  . Potassium 05/11/2014 4.4  3.7 - 5.3 mEq/L Final  . Chloride 05/11/2014 100  96 - 112 mEq/L Final  . CO2 05/11/2014 24  19 - 32 mEq/L Final  . Glucose, Bld 05/11/2014 104* 70 - 99 mg/dL Final  . BUN 05/11/2014 16  6 - 23 mg/dL Final  . Creatinine, Ser 05/11/2014 0.90  0.50 - 1.35 mg/dL Final  . Calcium 05/11/2014 9.6  8.4 - 10.5 mg/dL Final  . Total Protein 05/11/2014 8.1  6.0 - 8.3 g/dL Final  . Albumin 05/11/2014 3.6  3.5 - 5.2 g/dL Final  . AST 05/11/2014 17  0 - 37 U/L Final  . ALT 05/11/2014 13  0 - 53 U/L Final  . Alkaline Phosphatase 05/11/2014 94  39 - 117 U/L Final  . Total Bilirubin 05/11/2014 0.3  0.3 - 1.2 mg/dL Final  . GFR calc non Af Amer 05/11/2014 >90  >90 mL/min  Final  . GFR calc Af Amer 05/11/2014 >90  >90 mL/min Final   Comment: (NOTE)                          The eGFR has been calculated using the CKD EPI equation.                          This calculation has not been validated in all clinical situations.                          eGFR's persistently <90 mL/min signify possible Chronic Kidney                          Disease.  . Anion gap 05/11/2014 13  5 - 15 Final  . Prothrombin Time 05/11/2014 14.3  11.6 - 15.2 seconds Final  . INR 05/11/2014 1.11  0.00 - 1.49 Final  . Color, Urine 05/11/2014 YELLOW  YELLOW Final  . APPearance 05/11/2014 CLEAR  CLEAR Final  . Specific Gravity, Urine 05/11/2014 1.015  1.005 - 1.030 Final  . pH 05/11/2014 6.0  5.0 - 8.0 Final  . Glucose, UA 05/11/2014 NEGATIVE  NEGATIVE mg/dL Final  . Hgb urine dipstick 05/11/2014 NEGATIVE  NEGATIVE Final  . Bilirubin Urine 05/11/2014 NEGATIVE  NEGATIVE Final  . Ketones, ur 05/11/2014 NEGATIVE  NEGATIVE mg/dL Final  . Protein, ur 05/11/2014 NEGATIVE  NEGATIVE mg/dL Final  . Urobilinogen, UA 05/11/2014 0.2  0.0 - 1.0 mg/dL Final  . Nitrite 05/11/2014 NEGATIVE  NEGATIVE Final  . Leukocytes, UA 05/11/2014 NEGATIVE  NEGATIVE Final   MICROSCOPIC NOT DONE ON URINES WITH NEGATIVE PROTEIN, BLOOD, LEUKOCYTES, NITRITE, OR GLUCOSE <1000 mg/dL.  X-Rays:No results found.  EKG: Orders placed during the hospital encounter of 03/16/14  . EKG 12-LEAD  . EKG 12-LEAD     Hospital Course: [name] is a 53 y.o. who was admitted to [facility]. They were brought to the operating room on 05/21/2014 and underwent Procedure(s): [place].  Patient tolerated the procedure well and was later transferred to the recovery room and then to the orthopaedic floor for postoperative care.  They were given PO and IV analgesics for pain control following their surgery.  They were given 24 hours of postoperative antibiotics of  Anti-infectives   Start      Dose/Rate Route Frequency Ordered Stop   05/21/14 1400  ceFAZolin (ANCEF) IVPB 2 g/50 mL premix     2 g 100 mL/hr over 30 Minutes Intravenous Every 6 hours 05/21/14 1123 05/21/14 1951   05/21/14 0533  ceFAZolin (ANCEF) 3 g in dextrose 5 % 50 mL IVPB     3 g 160 mL/hr over 30 Minutes Intravenous On call to O.R. 05/21/14 7341 05/21/14 0732     and started on DVT prophylaxis in the form of Xarelto.   PT and OT were ordered for total joint protocol.  Discharge planning consulted to help with postop disposition and equipment needs.  Patient had a good night on the evening of surgery.  They started to get up OOB with therapy on day one. Hemovac drain was left in on day one and pulled on day two without difficulty.  Continued to work with therapy into day two.  Dressing was changed on day two and the incision was healing well.   Patient was seen in rounds and was ready to go home on POD 2.  Discharge home with home health  Diet - Cardiac diet  Follow up - in 2 weeks on the 20th or the 21st  Activity - WBAT  Disposition - Home  Condition Upon Discharge - Good  D/C Meds - See DC Summary  DVT Prophylaxis - Xarelto      Discharge Instructions   Call MD / Call 911    Complete by:  As directed   If you experience chest pain or shortness of breath, CALL 911 and be transported to the hospital emergency room.  If you develope a fever above 101 F, pus (white drainage) or increased drainage or redness at the wound, or calf pain, call your surgeon's office.     Change dressing    Complete by:  As directed   Change dressing daily with sterile 4 x 4 inch gauze dressing and apply TED hose. Do not submerge the incision under water.     Constipation Prevention    Complete by:  As directed   Drink plenty of fluids.  Prune juice may be helpful.  You may use a stool softener, such as Colace (over the counter) 100 mg twice a day.  Use MiraLax (over the counter) for constipation as needed.     Diet - low sodium  heart healthy    Complete by:  As directed      Discharge instructions    Complete by:  As directed   Pick up stool softner and laxative for home. Do not submerge incision under water. May shower. Continue to use ice for pain and swelling from surgery. Take Xarelto for two and a half more weeks, then discontinue Xarelto. Once the patient has completed the blood thinner regimen, then take a Baby 81 mg  Aspirin daily for three more weeks.     Do not put a pillow under the knee. Place it under the heel.    Complete by:  As directed      Do not sit on low chairs, stoools or toilet seats, as it may be difficult to get up from low surfaces    Complete by:  As directed      Driving restrictions    Complete by:  As directed   No driving until released by the physician.     Increase activity slowly as tolerated    Complete by:  As directed      Lifting restrictions    Complete by:  As directed   No lifting until released by the physician.     Patient may shower    Complete by:  As directed   You may shower without a dressing once there is no drainage.  Do not wash over the wound.  If drainage remains, do not shower until drainage stops.     TED hose    Complete by:  As directed   Use stockings (TED hose) for 3 weeks on both leg(s).  You may remove them at night for sleeping.     Weight bearing as tolerated    Complete by:  As directed             Medication List         ADVAIR DISKUS 250-50 MCG/DOSE Aepb  Generic drug:  Fluticasone-Salmeterol  Inhale 1 puff into the lungs daily as needed (shortness of breath).     diltiazem 240 MG 24 hr capsule  Commonly known as:  CARDIZEM CD  Take 240 mg by mouth every morning.     furosemide 40 MG tablet  Commonly known as:  LASIX  Take 40 mg by mouth every morning.     losartan 100 MG tablet  Commonly known as:  COZAAR  Take 100 mg by mouth every morning.     methocarbamol 500 MG tablet  Commonly known as:  ROBAXIN  Take 1 tablet (500  mg total) by mouth every 6 (six) hours as needed for muscle spasms.     metoprolol succinate 50 MG 24 hr tablet  Commonly known as:  TOPROL-XL  Take 50 mg by mouth every morning.     oxyCODONE 5 MG immediate release tablet  Commonly known as:  Oxy IR/ROXICODONE  Take 1-2 tablets (5-10 mg total) by mouth every 3 (three) hours as needed for moderate pain, severe pain or breakthrough pain.     predniSONE 5 MG tablet  Commonly known as:  DELTASONE  Take 5 mg by mouth daily with breakfast.     RABEprazole 20 MG tablet  Commonly known as:  ACIPHEX  Take 20 mg by mouth daily.     rivaroxaban 10 MG Tabs tablet  Commonly known as:  XARELTO  - Take 1 tablet (10 mg total) by mouth daily with breakfast. Take Xarelto for two and a half more weeks, then discontinue Xarelto.  - Once the patient has completed the blood thinner regimen, then take a Baby 81 mg Aspirin daily for three more weeks.     traMADol 50 MG tablet  Commonly known as:  ULTRAM  Take 1-2 tablets (50-100 mg total) by mouth every 6 (six) hours as needed (mild pain).       Follow-up Information   Follow up with [place])  Contact information:   [address] [place] [address] [phone]       Follow up with [name], MD. Schedule an appointment as soon as possible for a visit in 2 weeks. (Call for appointment either on the 20th or the 21st for follow up.)    Specialty:  Orthopedic Surgery   Contact information:   [address] [place] [address] [phone]       Signed: [name], PA-C Orthopaedic Surgery 05/23/2014, 8:48 AM

## 2014-05-23 NOTE — Progress Notes (Signed)
Patient d/c'd home.  Reviewed discharge paperwork with patient and spouse, both verbalized understanding.  Prescriptions given to patient's wife.  Confirmed with patient that no additional home equipment was needed at this time.  Patient escorted from floor by NT via wheelchair.  Dorothyann Peng RN

## 2014-05-23 NOTE — Progress Notes (Signed)
Physical Therapy Treatment Patient Details Name: [name] MRN: [id number] DOB: 1961/08/12 Today's Date: 05/23/2014    History of Present Illness Reimplantation R TKR    PT Comments    POD # 2 pt plans to D/C to home today.  Pt aware of KI use.  This is his 3rd knee surgery.  Asssisted with amb then perform TKR TE's following HEP handout.  Per pt MD told him to limit knee flex to only 50 degrees.  Pt tolerated session well.  Applied ICE to R knee.  Pt ready for D/C.    Follow Up Recommendations  Home health PT     Equipment Recommendations  None recommended by PT    Recommendations for Other Services       Precautions / Restrictions Precautions Precautions: Knee;Fall Precaution Comments: do not exceed knee flex >50 degrees.  Pt aware of KI use and when to D/C Required Braces or Orthoses: Knee Immobilizer - Right Knee Immobilizer - Right: Discontinue once straight leg raise with < 10 degree lag Restrictions Weight Bearing Restrictions: No Other Position/Activity Restrictions: WBAT    Mobility  Bed Mobility               General bed mobility comments: Pt OOB in recliner  Transfers Overall transfer level: Needs assistance Equipment used: Rolling walker (2 wheeled) Transfers: Sit to/from Stand Sit to Stand: Supervision;Min guard         General transfer comment: good safety cognition and use of hands  Ambulation/Gait Ambulation/Gait assistance: Supervision;Min guard Ambulation Distance (Feet): 55 Feet Assistive device: Rolling walker (2 wheeled) Gait Pattern/deviations: Step-to pattern;Trunk flexed Gait velocity: decreased   General Gait Details: <25% VC's on safety with turns and proper walker to self distance with turns.   Stairs Stairs:  (pt has a ramp enterance)          Wheelchair Mobility    Modified Rankin (Stroke Patients Only)       Balance                                    Cognition                             Exercises   Total Knee Replacement TE's 10 reps B LE ankle pumps 10 reps towel squeezes 10 reps knee presses 10 reps heel slides 10 reps SAQ's 10 reps SLR's 10 reps ABD Followed by ICE    General Comments        Pertinent Vitals/Pain      Home Living                      Prior Function            PT Goals (current goals can now be found in the care plan section) Progress towards PT goals: Progressing toward goals    Frequency  7X/week    PT Plan      Co-evaluation             End of Session Equipment Utilized During Treatment: Gait belt;Right knee immobilizer Activity Tolerance: Patient tolerated treatment well Patient left: in chair;with call bell/phone within reach     Time: 0855-0922 PT Time Calculation (min): 27 min  Charges:  $Gait Training: 8-22 mins $Therapeutic Exercise: 8-22 mins  G Codes:      Rica Koyanagi  PTA [facility]  Acute  Rehab Pager      [phone]

## 2014-05-23 NOTE — Progress Notes (Signed)
   Subjective: 2 Days Post-Op Procedure(s) (LRB): RIGHT KNEE ARTHROPLASTY REINPLANTATION (Right) Patient reports pain as mild.   Patient seen in rounds with Dr. Lequita Halt. Patient is well, and has had no acute complaints or problems Patient is ready to go home  Objective: Vital signs in last 24 hours: Temp:  [97.5 F (36.4 C)-98 F (36.7 C)] 98 F (36.7 C) (08/09 0520) Pulse Rate:  [57-69] 57 (08/09 0520) Resp:  [16-18] 16 (08/09 0520) BP: (127-147)/(81-86) 138/86 mmHg (08/09 0520) SpO2:  [99 %-100 %] 99 % (08/09 0520)  Intake/Output from previous day:  Intake/Output Summary (Last 24 hours) at 05/23/14 0804 Last data filed at 05/23/14 0520  Gross per 24 hour  Intake   1080 ml  Output   1075 ml  Net      5 ml    Intake/Output this shift:    Labs:  Recent Labs  05/22/14 0500 05/23/14 0435  HGB 9.8* 9.1*    Recent Labs  05/22/14 0500 05/23/14 0435  WBC 11.3* 12.9*  RBC 3.85* 3.59*  HCT 31.6* 29.9*  PLT 225 256    Recent Labs  05/22/14 0500 05/23/14 0435  NA 139 140  K 4.5 4.4  CL 105 104  CO2 27 28  BUN 13 17  CREATININE 0.86 0.97  GLUCOSE 153* 159*  CALCIUM 8.9 9.0   No results found for this basename: LABPT, INR,  in the last 72 hours  EXAM: General - Patient is Alert, Appropriate and Oriented Extremity - Neurovascular intact Sensation intact distally Dorsiflexion/Plantar flexion intact Incision - clean, dry, no drainage Motor Function - intact, moving foot and toes well on exam.   Assessment/Plan: 2 Days Post-Op Procedure(s) (LRB): RIGHT KNEE ARTHROPLASTY REINPLANTATION (Right) Procedure(s) (LRB): RIGHT KNEE ARTHROPLASTY REINPLANTATION (Right) Past Medical History  Diagnosis Date  . Asthma   . GERD (gastroesophageal reflux disease)   . Hypertension   . OSA (obstructive sleep apnea)     "quit wearing mask several years ago" (02/11/2014)  . Rheumatoid arthritis dx'd ~ 1977  . Cellulitis of lower extremity     "usually RLL; this time  it's got up to my lower abdoment" (02/11/2014)-series of antibiotics completed 02-28-14  . S/P PICC central line placement 03-16-14    right upper arm remains intact.05-11-14 removed 1 week ago.   Active Problems:   OA (osteoarthritis) of knee  Estimated body mass index is 43.8 kg/(m^2) as calculated from the following:   Height as of this encounter: 5\' 8"  (1.727 m).   Weight as of this encounter: 130.636 kg (288 lb). Up with therapy Discharge home with home health Diet - Cardiac diet Follow up - in 2 weeks on the 20th or the 21st Activity - WBAT Disposition - Home Condition Upon Discharge - Good D/C Meds - See DC Summary DVT Prophylaxis - Xarelto  22, PA-C Orthopaedic Surgery 05/23/2014, 8:04 AM

## 2014-05-24 LAB — BODY FLUID CULTURE: Culture: NO GROWTH

## 2014-05-25 ENCOUNTER — Encounter (HOSPITAL_COMMUNITY): Payer: Self-pay | Admitting: Orthopedic Surgery

## 2014-05-26 LAB — ANAEROBIC CULTURE

## 2014-08-24 ENCOUNTER — Other Ambulatory Visit (HOSPITAL_COMMUNITY): Payer: 59

## 2014-08-27 ENCOUNTER — Other Ambulatory Visit (HOSPITAL_COMMUNITY): Payer: 59

## 2014-09-03 ENCOUNTER — Other Ambulatory Visit (HOSPITAL_COMMUNITY): Payer: Self-pay | Admitting: Internal Medicine

## 2014-09-03 ENCOUNTER — Ambulatory Visit (HOSPITAL_COMMUNITY)
Admission: RE | Admit: 2014-09-03 | Discharge: 2014-09-03 | Disposition: A | Discharge status: Home / Self Care | Payer: 59 | Source: Ambulatory Visit | Attending: Vascular Surgery | Admitting: Vascular Surgery

## 2014-09-03 DIAGNOSIS — H538 Other visual disturbances: Secondary | ICD-10-CM

## 2014-09-27 ENCOUNTER — Encounter (HOSPITAL_COMMUNITY): Payer: Self-pay | Admitting: *Deleted

## 2014-09-27 NOTE — Anesthesia Preprocedure Evaluation (Signed)
Anesthesia Evaluation  Patient identified by MRN, date of birth, ID band Patient awake    Reviewed: Allergy & Precautions, H&P , NPO status , Patient's Chart, lab work & pertinent test results  History of Anesthesia Complications Negative for: history of anesthetic complications  Airway Mallampati: III  TM Distance: >3 FB Neck ROM: Full    Dental no notable dental hx. (+) Dental Advisory Given   Pulmonary asthma , sleep apnea and Continuous Positive Airway Pressure Ventilation , former smoker,  breath sounds clear to auscultation  Pulmonary exam normal       Cardiovascular hypertension, Pt. on medications + Peripheral Vascular Disease Rhythm:Regular Rate:Normal     Neuro/Psych negative neurological ROS  negative psych ROS   GI/Hepatic Neg liver ROS, GERD-  Medicated and Controlled,  Endo/Other  Morbid obesity  Renal/GU negative Renal ROS  negative genitourinary   Musculoskeletal  (+) Arthritis -, Rheumatoid disorders,    Abdominal (+) + obese,   Peds negative pediatric ROS (+)  Hematology negative hematology ROS (+)   Anesthesia Other Findings   Reproductive/Obstetrics negative OB ROS                             Anesthesia Physical Anesthesia Plan  ASA: III  Anesthesia Plan: General   Post-op Pain Management:    Induction: Intravenous  Airway Management Planned: LMA  Additional Equipment:   Intra-op Plan:   Post-operative Plan: Extubation in OR  Informed Consent: I have reviewed the patients History and Physical, chart, labs and discussed the procedure including the risks, benefits and alternatives for the proposed anesthesia with the patient or authorized representative who has indicated his/her understanding and acceptance.   Dental advisory given  Plan Discussed with: CRNA  Anesthesia Plan Comments:         Anesthesia Quick Evaluation

## 2014-09-28 ENCOUNTER — Ambulatory Visit (HOSPITAL_COMMUNITY): Payer: 59 | Admitting: Anesthesiology

## 2014-09-28 ENCOUNTER — Observation Stay (HOSPITAL_COMMUNITY)
Admission: RE | Admit: 2014-09-28 | Discharge: 2014-09-30 | Disposition: A | Discharge status: Home / Self Care | Payer: 59 | Source: Ambulatory Visit | Attending: Orthopedic Surgery | Admitting: Orthopedic Surgery

## 2014-09-28 ENCOUNTER — Encounter (HOSPITAL_COMMUNITY)
Admission: RE | Disposition: A | Discharge status: Home / Self Care | Payer: Self-pay | Source: Ambulatory Visit | Attending: Orthopedic Surgery

## 2014-09-28 ENCOUNTER — Encounter (HOSPITAL_COMMUNITY): Payer: Self-pay | Admitting: *Deleted

## 2014-09-28 DIAGNOSIS — G4733 Obstructive sleep apnea (adult) (pediatric): Secondary | ICD-10-CM | POA: Insufficient documentation

## 2014-09-28 DIAGNOSIS — Z880 Allergy status to penicillin: Secondary | ICD-10-CM | POA: Insufficient documentation

## 2014-09-28 DIAGNOSIS — Z882 Allergy status to sulfonamides status: Secondary | ICD-10-CM | POA: Insufficient documentation

## 2014-09-28 DIAGNOSIS — J45909 Unspecified asthma, uncomplicated: Secondary | ICD-10-CM | POA: Diagnosis not present

## 2014-09-28 DIAGNOSIS — Z7952 Long term (current) use of systemic steroids: Secondary | ICD-10-CM | POA: Diagnosis not present

## 2014-09-28 DIAGNOSIS — M71161 Other infective bursitis, right knee: Principal | ICD-10-CM | POA: Insufficient documentation

## 2014-09-28 DIAGNOSIS — Z881 Allergy status to other antibiotic agents status: Secondary | ICD-10-CM | POA: Insufficient documentation

## 2014-09-28 DIAGNOSIS — M069 Rheumatoid arthritis, unspecified: Secondary | ICD-10-CM | POA: Diagnosis not present

## 2014-09-28 DIAGNOSIS — Z79899 Other long term (current) drug therapy: Secondary | ICD-10-CM | POA: Diagnosis not present

## 2014-09-28 DIAGNOSIS — I10 Essential (primary) hypertension: Secondary | ICD-10-CM | POA: Insufficient documentation

## 2014-09-28 DIAGNOSIS — M71169 Other infective bursitis, unspecified knee: Secondary | ICD-10-CM | POA: Diagnosis present

## 2014-09-28 DIAGNOSIS — M7041 Prepatellar bursitis, right knee: Secondary | ICD-10-CM | POA: Diagnosis present

## 2014-09-28 DIAGNOSIS — K219 Gastro-esophageal reflux disease without esophagitis: Secondary | ICD-10-CM | POA: Insufficient documentation

## 2014-09-28 DIAGNOSIS — M7051 Other bursitis of knee, right knee: Secondary | ICD-10-CM

## 2014-09-28 HISTORY — PX: INCISION AND DRAINAGE: SHX5863

## 2014-09-28 LAB — BASIC METABOLIC PANEL
Anion gap: 12 (ref 5–15)
BUN: 28 mg/dL — ABNORMAL HIGH (ref 6–23)
CALCIUM: 9.7 mg/dL (ref 8.4–10.5)
CO2: 24 mEq/L (ref 19–32)
CREATININE: 1.16 mg/dL (ref 0.50–1.35)
Chloride: 99 mEq/L (ref 96–112)
GFR calc Af Amer: 81 mL/min — ABNORMAL LOW (ref >90)
GFR calc non Af Amer: 70 mL/min — ABNORMAL LOW (ref >90)
GLUCOSE: 107 mg/dL — AB (ref 70–99)
Potassium: 5.1 mEq/L (ref 3.7–5.3)
Sodium: 135 mEq/L — ABNORMAL LOW (ref 137–147)

## 2014-09-28 LAB — CBC
HCT: 37 % — ABNORMAL LOW (ref 39.0–52.0)
HEMOGLOBIN: 11.3 g/dL — AB (ref 13.0–17.0)
MCH: 23.7 pg — AB (ref 26.0–34.0)
MCHC: 30.5 g/dL (ref 30.0–36.0)
MCV: 77.6 fL — AB (ref 78.0–100.0)
Platelets: 442 10*3/uL — ABNORMAL HIGH (ref 150–400)
RBC: 4.77 MIL/uL (ref 4.22–5.81)
RDW: 17.2 % — ABNORMAL HIGH (ref 11.5–15.5)
WBC: 13.7 10*3/uL — ABNORMAL HIGH (ref 4.0–10.5)

## 2014-09-28 SURGERY — INCISION AND DRAINAGE
Anesthesia: General | Site: Knee | Laterality: Right

## 2014-09-28 MED ORDER — CHLORHEXIDINE GLUCONATE 4 % EX LIQD: 60.0000 mL | Freq: Once | CUTANEOUS | Status: DC

## 2014-09-28 MED ORDER — VANCOMYCIN HCL IN DEXTROSE 1-5 GM/200ML-% IV SOLN
1000.0000 mg | Freq: Two times a day (BID) | INTRAVENOUS | Status: AC
Start: 2014-09-29 — End: 2014-09-29
  Administered 2014-09-29: 1000 mg via INTRAVENOUS
  Filled 2014-09-28: qty 200

## 2014-09-28 MED ORDER — PREDNISONE 10 MG PO TABS
10.0000 mg | ORAL_TABLET | Freq: Every day | ORAL | Status: DC
  Administered 2014-09-29 – 2014-09-30 (×2): 10 mg via ORAL
  Filled 2014-09-28 (×3): qty 1

## 2014-09-28 MED ORDER — ONDANSETRON HCL 4 MG/2ML IJ SOLN: 4.0000 mg | Freq: Four times a day (QID) | INTRAMUSCULAR | Status: DC | PRN

## 2014-09-28 MED ORDER — LACTATED RINGERS IV SOLN
INTRAVENOUS | Status: DC | PRN
  Administered 2014-09-28: 18:00:00 via INTRAVENOUS

## 2014-09-28 MED ORDER — FENTANYL CITRATE 0.05 MG/ML IJ SOLN
INTRAMUSCULAR | Status: AC
  Filled 2014-09-28: qty 2

## 2014-09-28 MED ORDER — VANCOMYCIN HCL 10 G IV SOLR
1500.0000 mg | INTRAVENOUS | Status: AC
  Administered 2014-09-28: 1500 mg via INTRAVENOUS
  Filled 2014-09-28: qty 1500

## 2014-09-28 MED ORDER — ONDANSETRON HCL 4 MG/2ML IJ SOLN
INTRAMUSCULAR | Status: AC
  Filled 2014-09-28: qty 2

## 2014-09-28 MED ORDER — HYDROMORPHONE HCL 1 MG/ML IJ SOLN
INTRAMUSCULAR | Status: AC
  Administered 2014-09-28: 0.5 mg via INTRAVENOUS
  Filled 2014-09-28: qty 1

## 2014-09-28 MED ORDER — LIDOCAINE HCL 1 % IJ SOLN
INTRAMUSCULAR | Status: DC | PRN
  Administered 2014-09-28: 80 mg via INTRADERMAL

## 2014-09-28 MED ORDER — DILTIAZEM HCL ER COATED BEADS 240 MG PO CP24
240.0000 mg | ORAL_CAPSULE | Freq: Every morning | ORAL | Status: DC
  Administered 2014-09-29 – 2014-09-30 (×2): 240 mg via ORAL
  Filled 2014-09-28 (×2): qty 1

## 2014-09-28 MED ORDER — METOPROLOL SUCCINATE ER 50 MG PO TB24
50.0000 mg | ORAL_TABLET | Freq: Every morning | ORAL | Status: DC
  Administered 2014-09-29 – 2014-09-30 (×2): 50 mg via ORAL
  Filled 2014-09-28 (×2): qty 1

## 2014-09-28 MED ORDER — FENTANYL CITRATE 0.05 MG/ML IJ SOLN
INTRAMUSCULAR | Status: DC | PRN
  Administered 2014-09-28: 100 ug via INTRAVENOUS
  Administered 2014-09-28 (×5): 50 ug via INTRAVENOUS

## 2014-09-28 MED ORDER — ENOXAPARIN SODIUM 40 MG/0.4ML ~~LOC~~ SOLN
40.0000 mg | SUBCUTANEOUS | Status: DC
  Administered 2014-09-29 – 2014-09-30 (×2): 40 mg via SUBCUTANEOUS
  Filled 2014-09-28 (×3): qty 0.4

## 2014-09-28 MED ORDER — LIDOCAINE HCL (CARDIAC) 20 MG/ML IV SOLN
INTRAVENOUS | Status: AC
  Filled 2014-09-28: qty 5

## 2014-09-28 MED ORDER — SODIUM CHLORIDE 0.9 % IR SOLN
Status: DC | PRN
  Administered 2014-09-28: 3000 mL

## 2014-09-28 MED ORDER — PROPOFOL 10 MG/ML IV BOLUS
INTRAVENOUS | Status: AC
  Filled 2014-09-28: qty 20

## 2014-09-28 MED ORDER — MIDAZOLAM HCL 5 MG/5ML IJ SOLN
INTRAMUSCULAR | Status: DC | PRN
  Administered 2014-09-28: 2 mg via INTRAVENOUS

## 2014-09-28 MED ORDER — OXYCODONE HCL 5 MG PO TABS
5.0000 mg | ORAL_TABLET | ORAL | Status: DC | PRN
  Administered 2014-09-28 – 2014-09-30 (×4): 5 mg via ORAL
  Administered 2014-09-30: 10 mg via ORAL
  Filled 2014-09-28: qty 1
  Filled 2014-09-28: qty 2
  Filled 2014-09-28 (×3): qty 1

## 2014-09-28 MED ORDER — METOCLOPRAMIDE HCL 10 MG PO TABS: 5.0000 mg | ORAL_TABLET | Freq: Three times a day (TID) | ORAL | Status: DC | PRN

## 2014-09-28 MED ORDER — LOSARTAN POTASSIUM 50 MG PO TABS
100.0000 mg | ORAL_TABLET | Freq: Every morning | ORAL | Status: DC
  Administered 2014-09-29 – 2014-09-30 (×2): 100 mg via ORAL
  Filled 2014-09-28 (×2): qty 2

## 2014-09-28 MED ORDER — 0.9 % SODIUM CHLORIDE (POUR BTL) OPTIME
TOPICAL | Status: DC | PRN
  Administered 2014-09-28: 1000 mL

## 2014-09-28 MED ORDER — ACETAMINOPHEN 10 MG/ML IV SOLN
1000.0000 mg | Freq: Once | INTRAVENOUS | Status: DC
  Administered 2014-09-28: 1000 mg via INTRAVENOUS
  Filled 2014-09-28: qty 100

## 2014-09-28 MED ORDER — FUROSEMIDE 40 MG PO TABS
40.0000 mg | ORAL_TABLET | Freq: Every morning | ORAL | Status: DC
  Administered 2014-09-29 – 2014-09-30 (×2): 40 mg via ORAL
  Filled 2014-09-28 (×2): qty 1

## 2014-09-28 MED ORDER — TRAMADOL HCL 50 MG PO TABS
50.0000 mg | ORAL_TABLET | Freq: Four times a day (QID) | ORAL | Status: DC | PRN
Start: 2014-09-28 — End: 2014-09-30

## 2014-09-28 MED ORDER — MIDAZOLAM HCL 2 MG/2ML IJ SOLN
INTRAMUSCULAR | Status: AC
  Filled 2014-09-28: qty 2

## 2014-09-28 MED ORDER — MOMETASONE FURO-FORMOTEROL FUM 100-5 MCG/ACT IN AERO
2.0000 | INHALATION_SPRAY | Freq: Two times a day (BID) | RESPIRATORY_TRACT | Status: DC
  Administered 2014-09-28 – 2014-09-30 (×4): 2 via RESPIRATORY_TRACT
  Filled 2014-09-28 (×2): qty 8.8

## 2014-09-28 MED ORDER — FENTANYL CITRATE 0.05 MG/ML IJ SOLN: 25.0000 ug | INTRAMUSCULAR | Status: DC | PRN

## 2014-09-28 MED ORDER — PANTOPRAZOLE SODIUM 40 MG PO TBEC
40.0000 mg | DELAYED_RELEASE_TABLET | Freq: Every day | ORAL | Status: DC
  Administered 2014-09-29 – 2014-09-30 (×2): 40 mg via ORAL
  Filled 2014-09-28 (×2): qty 1

## 2014-09-28 MED ORDER — METOCLOPRAMIDE HCL 5 MG/ML IJ SOLN: 5.0000 mg | Freq: Three times a day (TID) | INTRAMUSCULAR | Status: DC | PRN

## 2014-09-28 MED ORDER — SODIUM CHLORIDE 0.9 % IV SOLN
INTRAVENOUS | Status: DC
  Administered 2014-09-28 – 2014-09-30 (×4): via INTRAVENOUS

## 2014-09-28 MED ORDER — PROPOFOL 10 MG/ML IV BOLUS
INTRAVENOUS | Status: DC | PRN
  Administered 2014-09-28: 200 mg via INTRAVENOUS

## 2014-09-28 MED ORDER — HYDROMORPHONE HCL 1 MG/ML IJ SOLN
0.2500 mg | INTRAMUSCULAR | Status: DC | PRN
  Administered 2014-09-28 (×4): 0.5 mg via INTRAVENOUS

## 2014-09-28 MED ORDER — FENTANYL CITRATE 0.05 MG/ML IJ SOLN
INTRAMUSCULAR | Status: AC
  Filled 2014-09-28: qty 5

## 2014-09-28 MED ORDER — ONDANSETRON HCL 4 MG/2ML IJ SOLN
INTRAMUSCULAR | Status: DC | PRN
  Administered 2014-09-28: 4 mg via INTRAVENOUS

## 2014-09-28 MED ORDER — ONDANSETRON HCL 4 MG PO TABS: 4.0000 mg | ORAL_TABLET | Freq: Four times a day (QID) | ORAL | Status: DC | PRN

## 2014-09-28 MED ORDER — ONDANSETRON HCL 4 MG/2ML IJ SOLN: 4.0000 mg | Freq: Once | INTRAMUSCULAR | Status: DC | PRN

## 2014-09-28 SURGICAL SUPPLY — 43 items
BAG SPEC THK2 15X12 ZIP CLS (MISCELLANEOUS) ×1
BAG ZIPLOCK 12X15 (MISCELLANEOUS) ×3 IMPLANT
BANDAGE ELASTIC 6 VELCRO ST LF (GAUZE/BANDAGES/DRESSINGS) ×3 IMPLANT
BANDAGE ESMARK 6X9 LF (GAUZE/BANDAGES/DRESSINGS) ×1 IMPLANT
BNDG CMPR 9X6 STRL LF SNTH (GAUZE/BANDAGES/DRESSINGS) ×1
BNDG ESMARK 6X9 LF (GAUZE/BANDAGES/DRESSINGS) ×3
CLOSURE WOUND 1/2 X4 (GAUZE/BANDAGES/DRESSINGS) ×2
CUFF TOURN SGL QUICK 34 (TOURNIQUET CUFF) ×3
CUFF TRNQT CYL 34X4X40X1 (TOURNIQUET CUFF) ×1 IMPLANT
DRAPE EXTREMITY T 121X128X90 (DRAPE) ×3 IMPLANT
DRAPE POUCH INSTRU U-SHP 10X18 (DRAPES) ×3 IMPLANT
DRAPE U-SHAPE 47X51 STRL (DRAPES) ×3 IMPLANT
DRSG ADAPTIC 3X8 NADH LF (GAUZE/BANDAGES/DRESSINGS) ×3 IMPLANT
DRSG PAD ABDOMINAL 8X10 ST (GAUZE/BANDAGES/DRESSINGS) ×3 IMPLANT
DURAPREP 26ML APPLICATOR (WOUND CARE) ×3 IMPLANT
ELECT REM PT RETURN 9FT ADLT (ELECTROSURGICAL) ×3
ELECTRODE REM PT RTRN 9FT ADLT (ELECTROSURGICAL) ×1 IMPLANT
EVACUATOR 1/8 PVC DRAIN (DRAIN) ×3 IMPLANT
GAUZE SPONGE 4X4 12PLY STRL (GAUZE/BANDAGES/DRESSINGS) ×3 IMPLANT
GLOVE BIO SURGEON STRL SZ7.5 (GLOVE) ×3 IMPLANT
GLOVE BIO SURGEON STRL SZ8 (GLOVE) ×6 IMPLANT
GLOVE BIOGEL PI IND STRL 8 (GLOVE) ×2 IMPLANT
GLOVE BIOGEL PI INDICATOR 8 (GLOVE) ×4
GLOVE ECLIPSE 8.0 STRL XLNG CF (GLOVE) IMPLANT
GOWN STRL REUS W/TWL LRG LVL3 (GOWN DISPOSABLE) ×3 IMPLANT
GOWN STRL REUS W/TWL XL LVL3 (GOWN DISPOSABLE) ×6 IMPLANT
HANDPIECE INTERPULSE COAX TIP (DISPOSABLE) ×3
KIT BASIN OR (CUSTOM PROCEDURE TRAY) ×3 IMPLANT
MANIFOLD NEPTUNE II (INSTRUMENTS) ×3 IMPLANT
PACK TOTAL JOINT (CUSTOM PROCEDURE TRAY) ×3 IMPLANT
PADDING CAST COTTON 6X4 STRL (CAST SUPPLIES) ×6 IMPLANT
POSITIONER SURGICAL ARM (MISCELLANEOUS) ×3 IMPLANT
SET HNDPC FAN SPRY TIP SCT (DISPOSABLE) ×1 IMPLANT
STAPLER VISISTAT 35W (STAPLE) ×3 IMPLANT
STRIP CLOSURE SKIN 1/2X4 (GAUZE/BANDAGES/DRESSINGS) ×4 IMPLANT
SUT MNCRL AB 4-0 PS2 18 (SUTURE) ×3 IMPLANT
SUT VIC AB 2-0 CT1 27 (SUTURE) ×9
SUT VIC AB 2-0 CT1 TAPERPNT 27 (SUTURE) ×3 IMPLANT
SUT VLOC 180 0 24IN GS25 (SUTURE) ×3 IMPLANT
SWAB COLLECTION DEVICE MRSA (MISCELLANEOUS) ×3 IMPLANT
TOWEL OR 17X26 10 PK STRL BLUE (TOWEL DISPOSABLE) ×6 IMPLANT
TUBE ANAEROBIC SPECIMEN COL (MISCELLANEOUS) ×3 IMPLANT
WRAP KNEE MAXI GEL POST OP (GAUZE/BANDAGES/DRESSINGS) ×2 IMPLANT

## 2014-09-28 NOTE — Transfer of Care (Signed)
Immediate Anesthesia Transfer of Care Note  Patient: [name]  Procedure(s) Performed: Procedure(s): INCISION AND DRAINAGE RIGHT KNEE (Right)  Patient Location: PACU  Anesthesia Type:General  Level of Consciousness: awake, alert , oriented and patient cooperative  Airway & Oxygen Therapy: Patient Spontanous Breathing and Patient connected to face mask oxygen  Post-op Assessment: Report given to PACU RN and Post -op Vital signs reviewed and stable  Post vital signs: Reviewed and stable  Complications: No apparent anesthesia complications

## 2014-09-28 NOTE — Brief Op Note (Signed)
09/28/2014  6:44 PM  PATIENT:  [name]  54 y.o. male  PRE-OPERATIVE DIAGNOSIS:  Infected RIGHT KNEE Prepatellar Bursa  POST-OPERATIVE DIAGNOSIS:   Infected RIGHT KNEE Prepatellar Bursa  PROCEDURE:  Irrigation and Debridement Right Knee  SURGEON:  Surgeon(s) and Role:    * [name], MD - Primary  PHYSICIAN ASSISTANT:   ASSISTANTS: [name], PA-C   ANESTHESIA:   general  EBL:  Total I/O In: 400 [I.V.:400] Out: -   DRAINS: Medium hemovac drain in the right knee   LOCAL MEDICATIONS USED:  NONE  COUNTS:  YES  TOURNIQUET: 14 minutes @ 300 mm Hg  DICTATION: .Other Dictation: Dictation Number [id number]  PLAN OF CARE: Admit for overnight observation  PATIENT DISPOSITION:  PACU - hemodynamically stable.

## 2014-09-28 NOTE — Anesthesia Postprocedure Evaluation (Signed)
  Anesthesia Post-op Note  Patient: [name]  Procedure(s) Performed: Procedure(s) (LRB): INCISION AND DRAINAGE RIGHT KNEE (Right)  Patient Location: PACU  Anesthesia Type: General  Level of Consciousness: awake and alert   Airway and Oxygen Therapy: Patient Spontanous Breathing  Post-op Pain: mild  Post-op Assessment: Post-op Vital signs reviewed, Patient's Cardiovascular Status Stable, Respiratory Function Stable, Patent Airway and No signs of Nausea or vomiting  Last Vitals:  Filed Vitals:   09/28/14 1900  BP: 160/85  Pulse: 55  Temp: 36.4 C  Resp: 10    Post-op Vital Signs: stable   Complications: No apparent anesthesia complications

## 2014-09-28 NOTE — H&P (Signed)
[name] is an 53 y.o. male.   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 He has done well until approximately 2 weeks ago when he developed anterior knee swelling consistent with prepatellar bursitis. Fluid was aspirated x 2 growing staph species. He has been on oral doxycycline and the fluid has recurred. He presents now for irrigation and debridement of the bursa and possibly the knee joint if there is communication between the 2 spaces.  Past Medical History  Diagnosis Date  . Asthma   . GERD (gastroesophageal reflux disease)   . Hypertension   . Rheumatoid arthritis dx'd ~ 1977  . Cellulitis of lower extremity     "usually RLL; this time it's got up to my lower abdoment" (02/11/2014)-series of antibiotics completed 02-28-14  . S/P PICC central line placement 03-16-14    right upper arm remains intact.05-11-14 removed 1 week ago.  . OSA (obstructive sleep apnea)     "quit wearing mask several years ago" (02/11/2014)    Past Surgical History  Procedure Laterality Date  . 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 Excisional hemorrhoidectomy    . Revision total knee arthroplasty Right 2012  . Picc line placement Right     right upper arm  . Foreign body removal Left     "BB" removal above left eye-teen yrs.  . Excisional total knee arthroplasty with antibiotic spacers Right 03/18/2014    Procedure: RIGHT KNEE RESECTION ARTHROPLASTY WITH ANTIBIOTIC SPACERS;  Surgeon: [name], MD;  Location: [facility];  Service: Orthopedics;  Laterality: Right;  . Total knee arthroplasty Right 05/21/2014    Procedure: RIGHT KNEE ARTHROPLASTY REINPLANTATION;  Surgeon: [name], MD;  Location: [facility];  Service: Orthopedics;   Laterality: Right;    Family History  Problem Relation Age of Onset  . Colon cancer Mother    Social History:  reports that he quit smoking about 33 years ago. His smoking use included Cigarettes. He has a 3 pack-year smoking history. His smokeless tobacco use includes Snuff. He reports that he does not drink alcohol or use illicit drugs.  Allergies:  Allergies  Allergen Reactions  . Avelox [Moxifloxacin Hcl In Nacl] Hives, Itching and Other (See Comments)    redness  . Sulfa Antibiotics Itching  . Erythrocin Rash  . Penicillins Rash    Medications Prior to Admission  Medication Sig Dispense Refill  . acidophilus (RISAQUAD) CAPS capsule Take 1 capsule by mouth daily.    Marland Kitchen ADVAIR DISKUS 250-50 MCG/DOSE AEPB Inhale 1 puff into the lungs daily as needed (shortness of breath).     . diltiazem (CARDIZEM CD) 240 MG 24 hr capsule Take 240 mg by mouth every morning.     Marland Kitchen doxycycline (VIBRA-TABS) 100 MG tablet Take 100 mg by mouth 2 (two) times daily.    Marland Kitchen etodolac (LODINE) 400 MG tablet Take 400 mg by mouth 2 (two) times daily.    . furosemide (LASIX) 40 MG tablet Take 40 mg by mouth every morning.    . hydroxychloroquine (PLAQUENIL) 200 MG tablet Take 400 mg by mouth daily.    Marland Kitchen losartan (COZAAR) 100 MG tablet Take 100 mg by mouth every morning.     Marland Kitchen  metoprolol (TOPROL-XL) 50 MG 24 hr tablet Take 50 mg by mouth every morning.     Marland Kitchen oxyCODONE (OXY IR/ROXICODONE) 5 MG immediate release tablet Take 1-2 tablets (5-10 mg total) by mouth every 3 (three) hours as needed for moderate pain, severe pain or breakthrough pain. 80 tablet 0  . predniSONE (DELTASONE) 5 MG tablet Take 10 mg by mouth daily with breakfast.     . RABEprazole (ACIPHEX) 20 MG tablet Take 20 mg by mouth daily.    . traMADol (ULTRAM) 50 MG tablet Take 1-2 tablets (50-100 mg total) by mouth every 6 (six) hours as needed (mild pain). 60 tablet 1  . methocarbamol (ROBAXIN) 500 MG tablet Take 1 tablet (500 mg total) by mouth every  6 (six) hours as needed for muscle spasms. (Patient not taking: Reported on 09/27/2014) 80 tablet 0  . rivaroxaban (XARELTO) 10 MG TABS tablet Take 1 tablet (10 mg total) by mouth daily with breakfast. Take Xarelto for two and a half more weeks, then discontinue Xarelto. Once the patient has completed the blood thinner regimen, then take a Baby 81 mg Aspirin daily for three more weeks. (Patient not taking: Reported on 09/27/2014) 19 tablet 1    Results for orders placed or performed during the hospital encounter of 09/28/14 (from the past 48 hour(s))  CBC     Status: Abnormal   Collection Time: 09/28/14  3:15 PM  Result Value Ref Range   WBC 13.7 (H) 4.0 - 10.5 K/uL   RBC 4.77 4.22 - 5.81 MIL/uL   Hemoglobin 11.3 (L) 13.0 - 17.0 g/dL   HCT 37.0 (L) 39.0 - 52.0 %   MCV 77.6 (L) 78.0 - 100.0 fL   MCH 23.7 (L) 26.0 - 34.0 pg   MCHC 30.5 30.0 - 36.0 g/dL   RDW 17.2 (H) 11.5 - 15.5 %   Platelets 442 (H) 150 - 400 K/uL  Basic metabolic panel     Status: Abnormal   Collection Time: 09/28/14  3:15 PM  Result Value Ref Range   Sodium 135 (L) 137 - 147 mEq/L   Potassium 5.1 3.7 - 5.3 mEq/L   Chloride 99 96 - 112 mEq/L   CO2 24 19 - 32 mEq/L   Glucose, Bld 107 (H) 70 - 99 mg/dL   BUN 28 (H) 6 - 23 mg/dL   Creatinine, Ser 1.16 0.50 - 1.35 mg/dL   Calcium 9.7 8.4 - 10.5 mg/dL   GFR calc non Af Amer 70 (L) >90 mL/min   GFR calc Af Amer 81 (L) >90 mL/min    Comment: (NOTE) The eGFR has been calculated using the CKD EPI equation. This calculation has not been validated in all clinical situations. eGFR's persistently <90 mL/min signify possible Chronic Kidney Disease.    Anion gap 12 5 - 15   No results found.  Review of Systems  Constitutional: Negative.   HENT: Negative.   Eyes: Negative.   Respiratory: Negative.   Cardiovascular: Positive for leg swelling.  Gastrointestinal: Negative.   Genitourinary: Negative.   Musculoskeletal: Positive for joint pain.  Skin: Negative.    Neurological: Negative.   Endo/Heme/Allergies: Negative.   Psychiatric/Behavioral: Negative.     Blood pressure 156/83, pulse 73, temperature 97.7 F (36.5 C), temperature source Oral, resp. rate 18, height $RemoveBe'5\' 8"'aTHGMPbSQ$  (1.727 m), weight 307 lb (139.254 kg), SpO2 99 %. Physical Exam  Physical Examination: General appearance - alert, well appearing, and in no distress Mental status - alert, oriented to person, place, and  time Chest - clear to auscultation, no wheezes, rales or rhonchi, symmetric air entry Heart - normal rate, regular rhythm, normal S1, S2, no murmurs, rubs, clicks or gallops Abdomen - soft, nontender, nondistended, no masses or organomegaly Neurological - alert, oriented, normal speech, no focal findings or movement disorder noted Right leg with swelling and erythema in lower leg. Has swollen ,warm pre-patellar bursa; no palpable intraarticular effusion  Assessment/Plan Infected right pre-patellar bursa- Plan irrigation and debridement right pre-patellar bursa and possibly knee depending on intraoperative findings. Discussed in detail with patient who elects to proceed  Darrien Laakso V 09/28/2014, 5:32 PM

## 2014-09-28 NOTE — Interval H&P Note (Signed)
History and Physical Interval Note:  09/28/2014 5:38 PM  [name]  has presented today for surgery, with the diagnosis of RIGHT TOTAL KNEE REIMPLANTATION INFECTION  The various methods of treatment have been discussed with the patient and family. After consideration of risks, benefits and other options for treatment, the patient has consented to  Procedure(s): INCISION AND DRAINAGE RIGHT KNEE (Right) as a surgical intervention .  The patient's history has been reviewed, patient examined, no change in status, stable for surgery.  I have reviewed the patient's chart and labs.  Questions were answered to the patient's satisfaction.     [name]

## 2014-09-29 ENCOUNTER — Encounter (HOSPITAL_COMMUNITY): Payer: Self-pay | Admitting: Orthopedic Surgery

## 2014-09-29 DIAGNOSIS — M71161 Other infective bursitis, right knee: Secondary | ICD-10-CM | POA: Diagnosis not present

## 2014-09-29 MED ORDER — VANCOMYCIN HCL 10 G IV SOLR
1250.0000 mg | Freq: Once | INTRAVENOUS | Status: AC
  Administered 2014-09-29: 1250 mg via INTRAVENOUS
  Filled 2014-09-29: qty 1250

## 2014-09-29 MED ORDER — VANCOMYCIN HCL 10 G IV SOLR
1250.0000 mg | Freq: Two times a day (BID) | INTRAVENOUS | Status: DC
  Administered 2014-09-30 (×2): 1250 mg via INTRAVENOUS
  Filled 2014-09-29 (×3): qty 1250

## 2014-09-29 NOTE — Progress Notes (Signed)
ANTIBIOTIC CONSULT NOTE - INITIAL  Pharmacy Consult for Vancomycin  Indication: Infection R TKA  Allergies  Allergen Reactions  . Avelox [Moxifloxacin Hcl In Nacl] Hives, Itching and Other (See Comments)    redness  . Sulfa Antibiotics Itching  . Erythrocin Rash  . Penicillins Rash   Patient Measurements: Height: 5\' 8"  (172.7 cm) Weight: (!) 307 lb (139.254 kg) IBW/kg (Calculated) : 68.4  Vital Signs: Temp: 98.2 F (36.8 C) (12/16 1419) BP: 127/70 mmHg (12/16 1419) Pulse Rate: 75 (12/16 1419) Intake/Output from previous day: 12/15 0701 - 12/16 0700 In: 1980 [P.O.:480; I.V.:1500] Out: 275 [Urine:275] Intake/Output from this shift: Total I/O In: 480 [P.O.:480] Out: 250 [Urine:250]  Labs:  Recent Labs  09/28/14 1515  WBC 13.7*  HGB 11.3*  PLT 442*  CREATININE 1.16   Estimated Creatinine Clearance: 100.8 mL/min (by C-G formula based on Cr of 1.16). No results for input(s): VANCOTROUGH, VANCOPEAK, VANCORANDOM, GENTTROUGH, GENTPEAK, GENTRANDOM, TOBRATROUGH, TOBRAPEAK, TOBRARND, AMIKACINPEAK, AMIKACINTROU, AMIKACIN in the last 72 hours.   Microbiology: No results found for this or any previous visit (from the past 720 hour(s)).  Medical History: Past Medical History  Diagnosis Date  . Asthma   . GERD (gastroesophageal reflux disease)   . Hypertension   . Rheumatoid arthritis dx'd ~ 1977  . Cellulitis of lower extremity     "usually RLL; this time it's got up to my lower abdoment" (02/11/2014)-series of antibiotics completed 02-28-14  . S/P PICC central line placement 03-16-14    right upper arm remains intact.05-11-14 removed 1 week ago.  . OSA (obstructive sleep apnea)     "quit wearing mask several years ago" (02/11/2014)   Medications:  Scheduled:  . diltiazem  240 mg Oral q morning - 10a  . enoxaparin (LOVENOX) injection  40 mg Subcutaneous Q24H  . furosemide  40 mg Oral q morning - 10a  . losartan  100 mg Oral q morning - 10a  . metoprolol succinate  50 mg  Oral q morning - 10a  . mometasone-formoterol  2 puff Inhalation BID  . pantoprazole  40 mg Oral Daily  . predniSONE  10 mg Oral Q breakfast  . vancomycin  1,250 mg Intravenous Once  . [START ON 09/30/2014] vancomycin  1,250 mg Intravenous Q12H   Anti-infectives    Start     Dose/Rate Route Frequency Ordered Stop   09/30/14 0600  vancomycin (VANCOCIN) 1,250 mg in sodium chloride 0.9 % 250 mL IVPB     1,250 mg166.7 mL/hr over 90 Minutes Intravenous Every 12 hours 09/29/14 1818     09/29/14 1830  vancomycin (VANCOCIN) 1,250 mg in sodium chloride 0.9 % 250 mL IVPB     1,250 mg166.7 mL/hr over 90 Minutes Intravenous  Once 09/29/14 1818     09/29/14 0600  vancomycin (VANCOCIN) IVPB 1000 mg/200 mL premix     1,000 mg200 mL/hr over 60 Minutes Intravenous Every 12 hours 09/28/14 2014 09/29/14 0721   09/28/14 1530  vancomycin (VANCOCIN) 1,500 mg in sodium chloride 0.9 % 500 mL IVPB     1,500 mg250 mL/hr over 120 Minutes Intravenous On call to O.R. 09/28/14 1516 09/28/14 1809     Assessment: 66 yoM s/p I&D on 12/15 of R knee for infection post TKRevision 4 months ago. Recurrence 2wks ago of prepatellar bursitis with fluid collection, fluid aspirated, stated growing Staph species (cannot verify culture result).   Received Vancomycin 1500mg  pre-I&D on 12/15, with 1 post-op dose of 1000mg  at 0600  Plan PICC line  placement, Vancomycin IV as outpatient (previously on Doxycycline PTA)  Pharmacy to dose Vancomycin in obese patient  Goal of Therapy:  Vancomycin trough level 10-15 mcg/ml  Plan:   Vancomycin 1250mg  IV q12  Would suggest trough level prior to 5th dose, likely to be discharged prior to 5th dose  Minda Ditto PharmD Pager [phone] 09/29/2014, 6:29 PM

## 2014-09-29 NOTE — Op Note (Signed)
[name], [name]                 ACCOUNT NO.:  [id number]  MEDICAL RECORD NO.:  [id number]  LOCATION:  8469                         FACILITY:  [facility]  PHYSICIAN:  Gaynelle Arabian, M.D.    DATE OF BIRTH:  [birth year]  DATE OF PROCEDURE:  09/28/2014 DATE OF DISCHARGE:                              OPERATIVE REPORT   PREOPERATIVE DIAGNOSIS:  Infected right knee prepatellar bursa.  POSTOPERATIVE DIAGNOSIS:  Infected right knee prepatellar bursa.  PROCEDURE:  Irrigation and debridement, right knee prepatellar bursa.  SURGEON:  Gaynelle Arabian, M.D.  ASSISTANT:  Alexzandrew L. Perkins, PA-C  ANESTHESIA:  General.  ESTIMATED BLOOD LOSS:  Minimal.  DRAINS:  Hemovac x1.  TOURNIQUET TIME:  14 minutes at 300 mmHg.  COMPLICATIONS:  None.  CONDITION:  Stable to recovery.  BRIEF CLINICAL NOTE:  [name] is a 53 year old male with complex history in regards to his right knee.  He had a 2 stage revision for infection back in August of this year.  He did fine until 2 weeks ago.  He started developing cellulitic appearance to his lower leg as well as swelling in the prepatellar bursa.  I drained this x2 and culture showed Staph species.  He has been on doxycycline, but the fluid has come back and he presents now for open irrigation and debridement of the right prepatellar bursa and possibly knee if the infection goes into the knee.  PROCEDURE IN DETAIL:  After successful administration of general anesthetic, a tourniquet was placed high on the right thigh.  Right lower extremity was prepped and draped in the usual sterile fashion. Extremity was wrapped in an Esmarch and tourniquet inflated to 300 mmHg. A midline incision was made with a 10 blade through a subcutaneous tissue at which point, there was a large amount of fluid present.  Since it had already been cultured before and he has been on antibiotics, we did not culture it again.  He did not appear to have any communication with the knee  joint in fact, there was no palpable fluid present at any time preoperatively or intraoperatively.  There was some fibrinous tissue present in the prepatellar bursa and had this debrided with a rongeur as well as Cobb elevator was used to elevate all the abnormal tissue off of the normal underlying tissue.  At this point, I would definitely see no communication to the joint.  We then thoroughly irrigated the bursa area with 3 L of saline using pulsatile lavage.  The tissue had a healthy appearance at this point.  A Hemovac drain was placed and then it was closed over a drain.  Subcu being closed with interrupted 2-0 Vicryl and then the tourniquet released with total time of 14 minutes.  The skin was closed with staples.  Drain was sewn in with suture.  The incision was then cleaned and dried and a bulky sterile dressing applied.  He was then awakened and transported to recovery in stable condition.     Gaynelle Arabian, M.D.     FA/MEDQ  D:  09/28/2014  T:  09/29/2014  Job:  [id number]

## 2014-09-29 NOTE — Progress Notes (Signed)
Subjective: 1 Day Post-Op Procedure(s) (LRB): INCISION AND DRAINAGE RIGHT KNEE (Right) Patient reports pain as mild.   Patient seen in rounds by Dr. Wynelle Link. Patient is well, but has had some minor complaints of pain in the knee, requiring pain medications We will start therapy today.  Plan is to go Home after hospital stay.  Objective: Vital signs in last 24 hours: Temp:  [97.6 F (36.4 C)-98.2 F (36.8 C)] 98.2 F (36.8 C) (12/16 1419) Pulse Rate:  [52-75] 75 (12/16 1419) Resp:  [10-16] 16 (12/16 1419) BP: (110-160)/(69-102) 127/70 mmHg (12/16 1419) SpO2:  [97 %-100 %] 97 % (12/16 1419)  Intake/Output from previous day: 12/15 0701 - 12/16 0700 In: 1980 [P.O.:480; I.V.:1500] Out: 275 [Urine:275] Intake/Output this shift: Total I/O In: 480 [P.O.:480] Out: 250 [Urine:250]   Recent Labs  09/28/14 1515  HGB 11.3*    Recent Labs  09/28/14 1515  WBC 13.7*  RBC 4.77  HCT 37.0*  PLT 442*    Recent Labs  09/28/14 1515  NA 135*  K 5.1  CL 99  CO2 24  BUN 28*  CREATININE 1.16  GLUCOSE 107*  CALCIUM 9.7   No results for input(s): LABPT, INR in the last 72 hours.  EXAM General - Patient is Alert and Appropriate Extremity - Neurovascular intact Sensation intact distally Dressing - dressing C/D/I Motor Function - intact, moving foot and toes well on exam.  Drain is sewn in.  Past Medical History  Diagnosis Date  . Asthma   . GERD (gastroesophageal reflux disease)   . Hypertension   . Rheumatoid arthritis dx'd ~ 1977  . Cellulitis of lower extremity     "usually RLL; this time it's got up to my lower abdoment" (02/11/2014)-series of antibiotics completed 02-28-14  . S/P PICC central line placement 03-16-14    right upper arm remains intact.05-11-14 removed 1 week ago.  . OSA (obstructive sleep apnea)     "quit wearing mask several years ago" (02/11/2014)    Assessment/Plan: 1 Day Post-Op Procedure(s) (LRB): INCISION AND DRAINAGE RIGHT KNEE  (Right) Principal Problem:   Patellar bursitis of right knee Active Problems:   Infected prepatellar bursa  Estimated body mass index is 46.69 kg/(m^2) as calculated from the following:   Height as of this encounter: 5\' 8"  (1.727 m).   Weight as of this encounter: 139.254 kg (307 lb). Up with therapy  DVT Prophylaxis - Lovenox Weight-Bearing as tolerated to right leg D/C O2 and Pulse OX and try on Room Air  [name], PA-C Orthopaedic Surgery 09/29/2014, 3:51 PM

## 2014-09-29 NOTE — Care Management Note (Signed)
    Page 1 of 2   09/30/2014     12:18:40 PM CARE MANAGEMENT NOTE 09/30/2014  Patient:  [name], [name]   Account Number:  [id number]  Date Initiated:  09/29/2014  Documentation initiated by:  [facility]  Subjective/Objective Assessment:   adm: INCISION AND DRAINAGE RIGHT KNEE (Right)     Action/Plan:   discharge planning   Anticipated DC Date:  09/30/2014   Anticipated DC Plan:  [place]  CM consult      [facility] Choice  HOME HEALTH   Choice offered to / List presented to:  C-1 Patient        [place] arranged  HH-1 RN  HH-2 PT  IV Antibiotics      [place].   Status of service:  Completed, signed off Medicare Important Message given?   (If response is "NO", the following Medicare IM given date fields will be blank) Date Medicare IM given:   Medicare IM given by:   Date Additional Medicare IM given:   Additional Medicare IM given by:    Discharge Disposition:  [place]  Per UR Regulation:  Reviewed for med. necessity/level of care/duration of stay  If discussed at [place] of Stay Meetings, dates discussed:    Comments:  09-30-14 [place] 1200 Spoke with patient and wife at beside. Some confusion on [facility] being able to to take the patient. Contacted Wendy at [facility] at [phone] she was waiting on orders so she could process. Not sure who started said they could not take [facility] as this patient has had previous service by this infusion company. Faxed orders, H&P, and demographics to [facility] at [phone] per request, d/c summary incomplete so will fax that later. Start of service will be tomorrow so patient should complete all abx scheduled for today prior to d/c, RN aware.  09/29/14 17;00 Cm met with pt in room to offer choice and pt states he wants [facility] to render HHPT and [facility] to render [facility] for IV ABX.  NO  orders have been placed. NO PICC line has been placed.  Both have been requested. Waiting for orders.  CM will continue to follow.  Mariane Masters, BSn, CM [phone].

## 2014-09-29 NOTE — Progress Notes (Signed)
UR completed 

## 2014-09-30 DIAGNOSIS — M71161 Other infective bursitis, right knee: Secondary | ICD-10-CM | POA: Diagnosis not present

## 2014-09-30 MED ORDER — HEPARIN SOD (PORK) LOCK FLUSH 100 UNIT/ML IV SOLN
250.0000 [IU] | INTRAVENOUS | Status: AC | PRN
  Administered 2014-09-30: 250 [IU]

## 2014-09-30 MED ORDER — METHOCARBAMOL 500 MG PO TABS: 500.0000 mg | ORAL_TABLET | Freq: Four times a day (QID) | ORAL | Status: DC | PRN

## 2014-09-30 MED ORDER — ENOXAPARIN SODIUM 40 MG/0.4ML ~~LOC~~ SOLN: 40.0000 mg | SUBCUTANEOUS | Status: DC

## 2014-09-30 MED ORDER — TRAMADOL HCL 50 MG PO TABS: 50.0000 mg | ORAL_TABLET | Freq: Four times a day (QID) | ORAL | Status: DC | PRN

## 2014-09-30 MED ORDER — SODIUM CHLORIDE 0.9 % IJ SOLN
10.0000 mL | INTRAMUSCULAR | Status: DC | PRN
  Administered 2014-09-30: 10 mL
  Filled 2014-09-30: qty 40

## 2014-09-30 MED ORDER — OXYCODONE HCL 5 MG PO TABS: 5.0000 mg | ORAL_TABLET | ORAL | Status: DC | PRN

## 2014-09-30 MED ORDER — SODIUM CHLORIDE 0.9 % IV SOLN: 1250.0000 mg | Freq: Two times a day (BID) | INTRAVENOUS | Status: DC

## 2014-09-30 NOTE — Progress Notes (Signed)
Peripherally Inserted Central Catheter/Midline Placement  The IV Nurse has discussed with the patient and/or persons authorized to consent for the patient, the purpose of this procedure and the potential benefits and risks involved with this procedure.  The benefits include less needle sticks, lab draws from the catheter and patient may be discharged home with the catheter.  Risks include, but not limited to, infection, bleeding, blood clot (thrombus formation), and puncture of an artery; nerve damage and irregular heat beat.  Alternatives to this procedure were also discussed.  PICC/Midline Placement Documentation        [name] 09/30/2014, 8:59 AM

## 2014-09-30 NOTE — Discharge Instructions (Signed)
Dr. Ollen Gross Total Joint Specialist [facility] [address], [address] [place], [place] [address] [phone]  KNEE POSTOPERATIVE DIRECTIONS    Knee Rehabilitation, Guidelines Following Surgery  Results after knee surgery are often greatly improved when you follow the exercise, range of motion and muscle strengthening exercises prescribed by your doctor. Safety measures are also important to protect the knee from further injury. Any time any of these exercises cause you to have increased pain or swelling in your knee joint, decrease the amount until you are comfortable again and slowly increase them. If you have problems or questions, call your caregiver or physical therapist for advice.   HOME CARE INSTRUCTIONS  Remove items at home which could result in a fall. This includes throw rugs or furniture in walking pathways.  Continue medications as instructed at time of discharge. You may have some home medications which will be placed on hold until you complete the course of blood thinner medication.  You may start showering once you are discharged home but do not submerge the incision under water. Just pat the incision dry and apply a dry gauze dressing on daily. Walk with walker as instructed.  You may resume a sexual relationship in one month or when given the OK by your doctor.   Use walker as long as suggested by your caregivers.  Avoid periods of inactivity such as sitting longer than an hour when not asleep. This helps prevent blood clots.  You may put full weight on your legs and walk as much as is comfortable.  You may return to work once you are cleared by your doctor.  Do not drive a car for 6 weeks or until released by you surgeon.   Do not drive while taking narcotics.  Wear the elastic stockings for three weeks following surgery during the day but you may remove then at night. Make sure you keep all of your appointments after your operation with all  of your doctors and caregivers. You should call the office at the above phone number and make an appointment for approximately two weeks after the date of your surgery. Change the dressing daily and reapply a dry dressing each time. Please pick up a stool softener and laxative for home use as long as you are requiring pain medications.  ICE to the affected knee every three hours for 30 minutes at a time and then as needed for pain and swelling.  Continue to use ice on the knee for pain and swelling from surgery. You may notice swelling that will progress down to the foot and ankle.  This is normal after surgery.  Elevate the leg when you are not up walking on it.   It is important for you to complete the blood thinner medication as prescribed by your doctor.  Continue to use the breathing machine which will help keep your temperature down.  It is common for your temperature to cycle up and down following surgery, especially at night when you are not up moving around and exerting yourself.  The breathing machine keeps your lungs expanded and your temperature down.  RANGE OF MOTION AND STRENGTHENING EXERCISES  Rehabilitation of the knee is important following a knee injury or an operation. After just a few days of immobilization, the muscles of the thigh which control the knee become weakened and shrink (atrophy). Knee exercises are designed to build up the tone and strength of the thigh muscles and to improve knee motion. Often times  heat used for twenty to thirty minutes before working out will loosen up your tissues and help with improving the range of motion but do not use heat for the first two weeks following surgery. These exercises can be done on a training (exercise) mat, on the floor, on a table or on a bed. Use what ever works the best and is most comfortable for you Knee exercises include:  Leg Lifts - While your knee is still immobilized in a splint or cast, you can do straight leg raises. Lift  the leg to 60 degrees, hold for 3 sec, and slowly lower the leg. Repeat 10-20 times 2-3 times daily. Perform this exercise against resistance later as your knee gets better.  Quad and Hamstring Sets - Tighten up the muscle on the front of the thigh (Quad) and hold for 5-10 sec. Repeat this 10-20 times hourly. Hamstring sets are done by pushing the foot backward against an object and holding for 5-10 sec. Repeat as with quad sets.  A rehabilitation program following serious knee injuries can speed recovery and prevent re-injury in the future due to weakened muscles. Contact your doctor or a physical therapist for more information on knee rehabilitation.   SKILLED REHAB INSTRUCTIONS: If the patient is transferred to a skilled rehab facility following release from the hospital, a list of the current medications will be sent to the facility for the patient to continue.  When discharged from the skilled rehab facility, please have the facility set up the patient's Home Health Physical Therapy prior to being released. Also, the skilled facility will be responsible for providing the patient with their medications at time of release from the facility to include their pain medication, the muscle relaxants, and their blood thinner medication. If the patient is still at the rehab facility at time of the two week follow up appointment, the skilled rehab facility will also need to assist the patient in arranging follow up appointment in our office and any transportation needs.  MAKE SURE YOU:  Understand these instructions.  Will watch your condition.  Will get help right away if you are not doing well or get worse.    Pick up stool softner and laxative for home use following surgery while on pain medications. Do not submerge incision under water. Please use good hand washing techniques while changing dressing each day. May shower starting three days after surgery. Please use a clean towel to pat the incision  dry following showers. Continue to use ice for pain and swelling after surgery. Do not use any lotions or creams on the incision until instructed by your surgeon.  Lovenox injections for five days at home and then switch over to a baby 81 mg Aspirin daily at home.    Postoperative Constipation Protocol  Constipation - defined medically as fewer than three stools per week and severe constipation as less than one stool per week.  One of the most common issues patients have following surgery is constipation.  Even if you have a regular bowel pattern at home, your normal regimen is likely to be disrupted due to multiple reasons following surgery.  Combination of anesthesia, postoperative narcotics, change in appetite and fluid intake all can affect your bowels.  In order to avoid complications following surgery, here are some recommendations in order to help you during your recovery period.  Colace (docusate) - Pick up an over-the-counter form of Colace or another stool softener and take twice a day as long as you  are requiring postoperative pain medications.  Take with a full glass of water daily.  If you experience loose stools or diarrhea, hold the colace until you stool forms back up.  If your symptoms do not get better within 1 week or if they get worse, check with your doctor.  Dulcolax (bisacodyl) - Pick up over-the-counter and take as directed by the product packaging as needed to assist with the movement of your bowels.  Take with a full glass of water.  Use this product as needed if not relieved by Colace only.   MiraLax (polyethylene glycol) - Pick up over-the-counter to have on hand.  MiraLax is a solution that will increase the amount of water in your bowels to assist with bowel movements.  Take as directed and can mix with a glass of water, juice, soda, coffee, or tea.  Take if you go more than two days without a movement. Do not use MiraLax more than once per day. Call your doctor if  you are still constipated or irregular after using this medication for 7 days in a row.  If you continue to have problems with postoperative constipation, please contact the office for further assistance and recommendations.  If you experience "the worst abdominal pain ever" or develop nausea or vomiting, please contact the office immediatly for further recommendations for treatment.  PICC Home Guide    A peripherally inserted central catheter (PICC)  is a long, thin, flexible tube that is inserted into a vein in the upper arm. It is a form of intravenous (IV) access. It is considered to be a "central" line because the tip of the PICC ends in a large vein in your chest. This large vein is called the superior vena cava (SVC). The PICC tip ends in the SVC because there is a lot of blood flow in the SVC. This allows medicines and IV fluids to be quickly distributed throughout the body. The PICC is inserted using a sterile technique by a specially trained nurse or physician. After the PICC is inserted, a chest X-ray exam is done to be sure it is in the correct place.  A PICC may be placed for different reasons, such as:  To give medicines and liquid nutrition that can only be given through a central line. Examples are:  Certain antibiotic treatments.  Chemotherapy.  Total parenteral nutrition (TPN). To take frequent blood samples.  To give IV fluids and blood products.  If there is difficulty placing a peripheral intravenous (PIV) catheter. If taken care of properly, a PICC can remain in place for several months. A PICC can also allow a person to go home from the hospital early. Medicine and PICC care can be managed at home by a family member or home health care team.  WHAT PROBLEMS CAN HAPPEN WHEN I HAVE A PICC?  Problems with a PICC can occasionally occur. These may include the following:  A blood clot (thrombus) forming in or at the tip of the PICC. This can cause the PICC to become clogged. A  clot-dissolving medicine called tissue plasminogen activator (tPA) can be given through the PICC to help break up the clot.  Inflammation of the vein (phlebitis) in which the PICC is placed. Signs of inflammation may include redness, pain at the insertion site, red streaks, or being able to feel a "cord" in the vein where the PICC is located.  Infection in the PICC or at the insertion site. Signs of infection may include fever, chills,  redness, swelling, or pus drainage from the PICC insertion site.  PICC movement (malposition). The PICC tip may move from its original position due to excessive physical activity, forceful coughing, sneezing, or vomiting.  A break or cut in the PICC. It is important to not use scissors near the PICC.  Nerve or tendon irritation or injury during PICC insertion. WHAT SHOULD I KEEP IN MIND ABOUT ACTIVITIES WHEN I HAVE A PICC?  You may bend your arm and move it freely. If your PICC is near or at the bend of your elbow, avoid activity with repeated motion at the elbow.  Rest at home for the remainder of the day following PICC line insertion.  Avoid lifting heavy objects as instructed by your health care provider.  Avoid using a crutch with the arm on the same side as your PICC. You may need to use a walker. WHAT SHOULD I KNOW ABOUT MY PICC DRESSING?  Keep your PICC bandage (dressing) clean and dry to prevent infection.  Ask your health care provider when you may shower. Ask your health care provider to teach you how to wrap the PICC when you do take a shower. Change the PICC dressing as instructed by your health care provider.  Change your PICC dressing if it becomes loose or wet. WHAT SHOULD I KNOW ABOUT PICC CARE?  Check the PICC insertion site daily for leakage, redness, swelling, or pain.  Do not take a bath, swim, or use hot tubs when you have a PICC. Cover PICC line with clear plastic wrap and tape to keep it dry while showering.  Flush the PICC as directed by your  health care provider. Let your health care provider know right away if the PICC is difficult to flush or does not flush. Do not use force to flush the PICC.  Do not use a syringe that is less than 10 mL to flush the PICC.  Never pull or tug on the PICC.  Avoid blood pressure checks on the arm with the PICC.  Keep your PICC identification card with you at all times.  Do not take the PICC out yourself. Only a trained clinical professional should remove the PICC. SEEK IMMEDIATE MEDICAL CARE IF:  Your PICC is accidentally pulled all the way out. If this happens, cover the insertion site with a bandage or gauze dressing. Do not throw the PICC away. Your health care provider will need to inspect it.  Your PICC was tugged or pulled and has partially come out. Do not push the PICC back in.  There is any type of drainage, redness, or swelling where the PICC enters the skin.  You cannot flush the PICC, it is difficult to flush, or the PICC leaks around the insertion site when it is flushed.  You hear a "flushing" sound when the PICC is flushed.  You have pain, discomfort, or numbness in your arm, shoulder, or jaw on the same side as the PICC.  You feel your heart "racing" or skipping beats.  You notice a hole or tear in the PICC.  You develop chills or a fever. MAKE SURE YOU:  Understand these instructions.  Will watch your condition.  Will get help right away if you are not doing well or get worse. Document Released: 04/07/2003 Document Revised: 02/15/2014 Document Reviewed: 06/08/2013  [facility] Patient Information 2015 [place], [place]. This information is not intended to replace advice given to you by your health care provider. Make sure you discuss any questions you  have with your health care provider.

## 2014-09-30 NOTE — Evaluation (Addendum)
Physical Therapy Evaluation Patient Details Name: [name] MRN: [id number] DOB: 1961/03/14 Today's Date: 09/30/2014   History of Present Illness  s/p I&D right knee patella bursitis  Clinical Impression  Pt known to me from previous admissions; reviewed (verbally) basic knee ex, pt and wife are very familiar and aware of importance of  continuing to keep knee moving; no further needs other than HHPT safety eval; has all DME    Follow Up Recommendations Home health PT (safety eval)    Equipment Recommendations  None recommended by PT    Recommendations for Other Services       Precautions / Restrictions Precautions Precautions: Fall Restrictions Weight Bearing Restrictions: No Other Position/Activity Restrictions: WBAT R LE      Mobility  Bed Mobility               General bed mobility comments: on EOB; no assist needed per wife  Transfers Overall transfer level: Modified independent Equipment used: None Transfers: Sit to/from Stand Sit to Stand: Supervision         General transfer comment: for safety  Ambulation/Gait Ambulation/Gait assistance: Supervision to I over smooth level surface Ambulation Distance (Feet): 180 Feet Assistive device: Rolling walker (2 wheeled) Gait Pattern/deviations: Decreased weight shift to right     General Gait Details: advised pt to use cane at home prn  Stairs            Wheelchair Mobility    Modified Rankin (Stroke Patients Only)       Balance                                             Pertinent Vitals/Pain Pain Assessment: No/denies pain    Home Living Family/patient expects to be discharged to:: Private residence Living Arrangements: Spouse/significant other Available Help at Discharge: Family Type of Home: House Home Access: Ramped entrance     Home Layout: One level Home Equipment: Environmental consultant - 2 wheels;Crutches;Bedside commode;Cane - single point      Prior Function  Level of Independence: Independent               Hand Dominance        Extremity/Trunk Assessment   Upper Extremity Assessment: Overall WFL for tasks assessed           Lower Extremity Assessment: RLE deficits/detail RLE Deficits / Details: limited by edema; at least 95* flexion       Communication   Communication: No difficulties  Cognition Arousal/Alertness: Awake/alert Behavior During Therapy: WFL for tasks assessed/performed Overall Cognitive Status: Within Functional Limits for tasks assessed                      General Comments General comments (skin integrity, edema, etc.): RLE edematous; pt to ice and elevate after he finishes bath and prior to D/C;     Exercises        Assessment/Plan    PT Assessment All further PT needs can be met in the next venue of care  PT Diagnosis Difficulty walking   PT Problem List    PT Treatment Interventions     PT Goals (Current goals can be found in the Care Plan section) Acute Rehab PT Goals PT Goal Formulation: All assessment and education complete, DC therapy    Frequency     Barriers to discharge  Co-evaluation               End of Session   Activity Tolerance: Patient tolerated treatment well Patient left: Other (comment) (EOB) Nurse Communication: Mobility status         Time: 1594-7076 PT Time Calculation (min) (ACUTE ONLY): 19 min   Charges:   PT Evaluation $Initial PT Evaluation Tier I: 1 Procedure PT Treatments $Gait Training: 8-22 mins   PT G Codes:          [name] 10/26/2014, 10:56 AM

## 2014-09-30 NOTE — Progress Notes (Signed)
   Subjective: 2 Days Post-Op Procedure(s) (LRB): INCISION AND DRAINAGE RIGHT KNEE (Right) Patient reports pain as mild.   Patient seen in rounds by Dr. Wynelle Link.   Patient is well, and has had no acute complaints or problems Patient is ready to go home today.  PICC line placed. Will need IV antibiotics at home via PICC line.  Objective: Vital signs in last 24 hours: Temp:  [98.1 F (36.7 C)-98.3 F (36.8 C)] 98.3 F (36.8 C) (12/17 0540) Pulse Rate:  [72-75] 72 (12/17 0540) Resp:  [16-18] 18 (12/17 0540) BP: (127-149)/(70-79) 149/79 mmHg (12/17 0540) SpO2:  [97 %-98 %] 97 % (12/17 0540)  Intake/Output from previous day:  Intake/Output Summary (Last 24 hours) at 09/30/14 1031 Last data filed at 09/30/14 0600  Gross per 24 hour  Intake   1920 ml  Output    700 ml  Net   1220 ml    Labs:  Recent Labs  09/28/14 1515  HGB 11.3*    Recent Labs  09/28/14 1515  WBC 13.7*  RBC 4.77  HCT 37.0*  PLT 442*    Recent Labs  09/28/14 1515  NA 135*  K 5.1  CL 99  CO2 24  BUN 28*  CREATININE 1.16  GLUCOSE 107*  CALCIUM 9.7   No results for input(s): LABPT, INR in the last 72 hours.  EXAM: General - Patient is Alert, Appropriate and Oriented Extremity - Neurovascular intact Sensation intact distally Incision - clean, dry, healing Motor Function - intact, moving foot and toes well on exam.   Assessment/Plan: 2 Days Post-Op Procedure(s) (LRB): INCISION AND DRAINAGE RIGHT KNEE (Right) Procedure(s) (LRB): INCISION AND DRAINAGE RIGHT KNEE (Right) Past Medical History  Diagnosis Date  . Asthma   . GERD (gastroesophageal reflux disease)   . Hypertension   . Rheumatoid arthritis dx'd ~ 1977  . Cellulitis of lower extremity     "usually RLL; this time it's got up to my lower abdoment" (02/11/2014)-series of antibiotics completed 02-28-14  . S/P PICC central line placement 03-16-14    right upper arm remains intact.05-11-14 removed 1 week ago.  . OSA (obstructive  sleep apnea)     "quit wearing mask several years ago" (02/11/2014)   Principal Problem:   Patellar bursitis of right knee Active Problems:   Infected prepatellar bursa  Estimated body mass index is 46.69 kg/(m^2) as calculated from the following:   Height as of this encounter: 5\' 8"  (1.727 m).   Weight as of this encounter: 139.254 kg (307 lb). Up with therapy Discharge home with home health for a one time eval and HH IV infusion for antibiotics. Diet - Cardiac diet Follow up - in 2 weeks Activity - WBAT Disposition - Home Condition Upon Discharge - Good D/C Meds - See DC Summary DVT Prophylaxis - Lovenox for eight more days and then Aspirin 325 mg twice a day.  [name], PA-C Orthopaedic Surgery 09/30/2014, 10:31 AM

## 2014-09-30 NOTE — Discharge Summary (Signed)
Physician Discharge Summary   Patient ID: [name] MRN: [id number] DOB/AGE: 1961-04-13 53 y.o.  Admit date: 09/28/2014 Discharge date: 09/30/2014  Primary Diagnosis:  Infected right knee prepatellar bursa. Admission Diagnoses:  Past Medical History  Diagnosis Date  . Asthma   . GERD (gastroesophageal reflux disease)   . Hypertension   . Rheumatoid arthritis dx'd ~ 1977  . Cellulitis of lower extremity     "usually RLL; this time it's got up to my lower abdoment" (02/11/2014)-series of antibiotics completed 02-28-14  . S/P PICC central line placement 03-16-14    right upper arm remains intact.05-11-14 removed 1 week ago.  . OSA (obstructive sleep apnea)     "quit wearing mask several years ago" (02/11/2014)   Discharge Diagnoses:   Principal Problem:   Patellar bursitis of right knee Active Problems:   Infected prepatellar bursa  Estimated body mass index is 46.69 kg/(m^2) as calculated from the following:   Height as of this encounter: 5' 8" (1.727 m).   Weight as of this encounter: 139.254 kg (307 lb).  Procedure:  Procedure(s) (LRB): INCISION AND DRAINAGE RIGHT KNEE (Right)   Consults: None  HPI: [name] is a 53 year old male with complex history in regards to his right knee. He had a 2 stage revision for infection back in August of this year. He did fine until 2 weeks ago. He started developing cellulitic appearance to his lower leg as well as swelling in the prepatellar bursa. I drained this x2 and culture showed Staph species. He has been on doxycycline, but the fluid has come back and he presents now for open irrigation and debridement of the right prepatellar bursa and possibly knee if the infection goes into the knee.  Laboratory Data: Admission on 09/28/2014, Discharged on 09/30/2014  Component Date Value Ref Range Status  . WBC 09/28/2014 13.7* 4.0 - 10.5 K/uL Final  . RBC 09/28/2014 4.77  4.22 - 5.81 MIL/uL Final  . Hemoglobin 09/28/2014 11.3* 13.0 -  17.0 g/dL Final  . HCT 09/28/2014 37.0* 39.0 - 52.0 % Final  . MCV 09/28/2014 77.6* 78.0 - 100.0 fL Final  . MCH 09/28/2014 23.7* 26.0 - 34.0 pg Final  . MCHC 09/28/2014 30.5  30.0 - 36.0 g/dL Final  . RDW 09/28/2014 17.2* 11.5 - 15.5 % Final  . Platelets 09/28/2014 442* 150 - 400 K/uL Final  . Sodium 09/28/2014 135* 137 - 147 mEq/L Final  . Potassium 09/28/2014 5.1  3.7 - 5.3 mEq/L Final  . Chloride 09/28/2014 99  96 - 112 mEq/L Final  . CO2 09/28/2014 24  19 - 32 mEq/L Final  . Glucose, Bld 09/28/2014 107* 70 - 99 mg/dL Final  . BUN 09/28/2014 28* 6 - 23 mg/dL Final  . Creatinine, Ser 09/28/2014 1.16  0.50 - 1.35 mg/dL Final  . Calcium 09/28/2014 9.7  8.4 - 10.5 mg/dL Final  . GFR calc non Af Amer 09/28/2014 70* >90 mL/min Final  . GFR calc Af Amer 09/28/2014 81* >90 mL/min Final   Comment: (NOTE) The eGFR has been calculated using the CKD EPI equation. This calculation has not been validated in all clinical situations. eGFR's persistently <90 mL/min signify possible Chronic Kidney Disease.   . Anion gap 09/28/2014 12  5 - 15 Final     X-Rays:No results found.  EKG: Orders placed or performed during the hospital encounter of 03/16/14  . EKG 12-Lead  . EKG 12-Lead     Hospital Course: [name] is a 53 y.o.  who was admitted to [facility]. They were brought to the operating room on 09/28/2014 and underwent Procedure(s): INCISION AND DRAINAGE RIGHT KNEE.  Patient tolerated the procedure well and was later transferred to the recovery room and then to the orthopaedic floor for postoperative care.  They were given PO and IV analgesics for pain control following their surgery.  They were given 24 hours of postoperative antibiotics of  Anti-infectives    Start     Dose/Rate Route Frequency Ordered Stop   09/30/14 0600  vancomycin (VANCOCIN) 1,250 mg in sodium chloride 0.9 % 250 mL IVPB  Status:  Discontinued     1,250 mg166.7 mL/hr over 90 Minutes Intravenous Every 12  hours 09/29/14 1818 09/30/14 2347   09/30/14 0000  vancomycin 1,250 mg in sodium chloride 0.9 % 250 mL     1,250 mg166.7 mL/hr over 90 Minutes Intravenous Every 12 hours 09/30/14 1054     09/29/14 1830  vancomycin (VANCOCIN) 1,250 mg in sodium chloride 0.9 % 250 mL IVPB     1,250 mg166.7 mL/hr over 90 Minutes Intravenous  Once 09/29/14 1818 09/29/14 2000   09/29/14 0600  vancomycin (VANCOCIN) IVPB 1000 mg/200 mL premix     1,000 mg200 mL/hr over 60 Minutes Intravenous Every 12 hours 09/28/14 2014 09/29/14 0721   09/28/14 1530  vancomycin (VANCOCIN) 1,500 mg in sodium chloride 0.9 % 500 mL IVPB     1,500 mg250 mL/hr over 120 Minutes Intravenous On call to O.R. 09/28/14 1516 09/28/14 1809     and started on DVT prophylaxis in the form of Lovenox.   PT and OT were ordered for total joint protocol.  Discharge planning consulted to help with postop disposition and equipment needs.  Patient had a good night on the evening of surgery.  They started to get up OOB with therapy on day one. PICC line placed.  Needed IV antibiotics at home via PICC line.  Continued to work with therapy into day two.  Dressing was changed on day two and the incision was clean and dry.  Patient was seen in rounds and was ready to go home.  Discharge home with home health for a one time eval and HH IV infusion for antibiotics. Diet - Cardiac diet Follow up - in 2 weeks Activity - WBAT Disposition - Home Condition Upon Discharge - Good D/C Meds - See DC Summary DVT Prophylaxis - Lovenox for eight more days and then Aspirin 325 mg twice a day.       Discharge Instructions    Call MD / Call 911    Complete by:  As directed   If you experience chest pain or shortness of breath, CALL 911 and be transported to the hospital emergency room.  If you develope a fever above 101 F, pus (white drainage) or increased drainage or redness at the wound, or calf pain, call your surgeon's office.     Change dressing    Complete by:  As  directed   Change dressing daily with sterile 4 x 4 inch gauze dressing and apply TED hose. Do not submerge the incision under water.     Constipation Prevention    Complete by:  As directed   Drink plenty of fluids.  Prune juice may be helpful.  You may use a stool softener, such as Colace (over the counter) 100 mg twice a day.  Use MiraLax (over the counter) for constipation as needed.     Diet - low sodium heart  healthy    Complete by:  As directed      Discharge instructions    Complete by:  As directed   Pick up stool softner and laxative for home use following surgery while on pain medications. Do not submerge incision under water. Please use good hand washing techniques while changing dressing each day. May shower starting three days after surgery. Please use a clean towel to pat the incision dry following showers. Continue to use ice for pain and swelling after surgery. Do not use any lotions or creams on the incision until instructed by your surgeon.  Lovenox for five days at home and then switch over to a baby 81 mg Aspirin daily at home for three weeks.     Do not put a pillow under the knee. Place it under the heel.    Complete by:  As directed      Do not sit on low chairs, stoools or toilet seats, as it may be difficult to get up from low surfaces    Complete by:  As directed      Driving restrictions    Complete by:  As directed   No driving until released by the physician.     Increase activity slowly as tolerated    Complete by:  As directed      Lifting restrictions    Complete by:  As directed   No lifting until released by the physician.     Patient may shower    Complete by:  As directed   You may shower without a dressing once there is no drainage.  Do not wash over the wound.  If drainage remains, do not shower until drainage stops.     TED hose    Complete by:  As directed   Use stockings (TED hose) for 3 weeks on both leg(s).  You may remove them at night  for sleeping.     Weight bearing as tolerated    Complete by:  As directed   Laterality:  right  Extremity:  Lower            Medication List    STOP taking these medications        acidophilus Caps capsule     doxycycline 100 MG tablet  Commonly known as:  VIBRA-TABS     etodolac 400 MG tablet  Commonly known as:  LODINE     hydroxychloroquine 200 MG tablet  Commonly known as:  PLAQUENIL     rivaroxaban 10 MG Tabs tablet  Commonly known as:  XARELTO      TAKE these medications        ADVAIR DISKUS 250-50 MCG/DOSE Aepb  Generic drug:  Fluticasone-Salmeterol  Inhale 1 puff into the lungs daily as needed (shortness of breath).     diltiazem 240 MG 24 hr capsule  Commonly known as:  CARDIZEM CD  Take 240 mg by mouth every morning.     enoxaparin 40 MG/0.4ML injection  Commonly known as:  LOVENOX  Inject 0.4 mLs (40 mg total) into the skin daily. Lovenox injections for five more days and then switch to a baby 81 mg Aspirin daily for three more weeks at home.     furosemide 40 MG tablet  Commonly known as:  LASIX  Take 40 mg by mouth every morning.     losartan 100 MG tablet  Commonly known as:  COZAAR  Take 100 mg by mouth every morning.  methocarbamol 500 MG tablet  Commonly known as:  ROBAXIN  Take 1 tablet (500 mg total) by mouth every 6 (six) hours as needed for muscle spasms.     metoprolol succinate 50 MG 24 hr tablet  Commonly known as:  TOPROL-XL  Take 50 mg by mouth every morning.     oxyCODONE 5 MG immediate release tablet  Commonly known as:  Oxy IR/ROXICODONE  Take 1-2 tablets (5-10 mg total) by mouth every 3 (three) hours as needed for moderate pain, severe pain or breakthrough pain.     predniSONE 5 MG tablet  Commonly known as:  DELTASONE  Take 10 mg by mouth daily with breakfast.     RABEprazole 20 MG tablet  Commonly known as:  ACIPHEX  Take 20 mg by mouth daily.     traMADol 50 MG tablet  Commonly known as:  ULTRAM  Take 1-2  tablets (50-100 mg total) by mouth every 6 (six) hours as needed (mild pain).     vancomycin 1,250 mg in sodium chloride 0.9 % 250 mL  Inject 1,250 mg into the vein every 12 (twelve) hours. Vancomycin every 12 hours twice a day for a total of four weeks via PICC line.       Follow-up Information    Follow up with [name], MD In 2 weeks.   Specialty:  Orthopedic Surgery   Why:  Call office at [phone] to set up appointment with Dr. Wynelle Link on Dec 28th or 29th.   Contact information:   [address] [place] [address] [phone]       Signed: [name], PA-C Orthopaedic Surgery 10/26/2014, 9:51 AM

## 2014-11-09 ENCOUNTER — Ambulatory Visit: Payer: 59 | Admitting: Gastroenterology

## 2014-12-21 ENCOUNTER — Ambulatory Visit: Payer: 59 | Admitting: Gastroenterology

## 2015-12-03 ENCOUNTER — Emergency Department (HOSPITAL_COMMUNITY): Payer: 59

## 2015-12-03 ENCOUNTER — Inpatient Hospital Stay (HOSPITAL_COMMUNITY)
Admission: EM | Admit: 2015-12-03 | Discharge: 2015-12-09 | DRG: 354 | Disposition: A | Discharge status: Home / Self Care | Payer: 59 | Attending: General Surgery | Admitting: General Surgery

## 2015-12-03 ENCOUNTER — Encounter (HOSPITAL_COMMUNITY): Payer: Self-pay | Admitting: *Deleted

## 2015-12-03 DIAGNOSIS — J45909 Unspecified asthma, uncomplicated: Secondary | ICD-10-CM | POA: Diagnosis present

## 2015-12-03 DIAGNOSIS — K5669 Other intestinal obstruction: Secondary | ICD-10-CM | POA: Diagnosis not present

## 2015-12-03 DIAGNOSIS — E669 Obesity, unspecified: Secondary | ICD-10-CM | POA: Diagnosis present

## 2015-12-03 DIAGNOSIS — Z881 Allergy status to other antibiotic agents status: Secondary | ICD-10-CM

## 2015-12-03 DIAGNOSIS — K219 Gastro-esophageal reflux disease without esophagitis: Secondary | ICD-10-CM | POA: Diagnosis present

## 2015-12-03 DIAGNOSIS — Z833 Family history of diabetes mellitus: Secondary | ICD-10-CM

## 2015-12-03 DIAGNOSIS — Z96653 Presence of artificial knee joint, bilateral: Secondary | ICD-10-CM | POA: Diagnosis present

## 2015-12-03 DIAGNOSIS — Z96643 Presence of artificial hip joint, bilateral: Secondary | ICD-10-CM | POA: Diagnosis present

## 2015-12-03 DIAGNOSIS — K436 Other and unspecified ventral hernia with obstruction, without gangrene: Principal | ICD-10-CM | POA: Diagnosis present

## 2015-12-03 DIAGNOSIS — K56609 Unspecified intestinal obstruction, unspecified as to partial versus complete obstruction: Secondary | ICD-10-CM

## 2015-12-03 DIAGNOSIS — I10 Essential (primary) hypertension: Secondary | ICD-10-CM | POA: Diagnosis present

## 2015-12-03 DIAGNOSIS — Z6841 Body Mass Index (BMI) 40.0 and over, adult: Secondary | ICD-10-CM

## 2015-12-03 DIAGNOSIS — M069 Rheumatoid arthritis, unspecified: Secondary | ICD-10-CM | POA: Diagnosis present

## 2015-12-03 DIAGNOSIS — G4733 Obstructive sleep apnea (adult) (pediatric): Secondary | ICD-10-CM | POA: Diagnosis present

## 2015-12-03 DIAGNOSIS — Z79899 Other long term (current) drug therapy: Secondary | ICD-10-CM

## 2015-12-03 DIAGNOSIS — Z800 Family history of malignant neoplasm of digestive organs: Secondary | ICD-10-CM

## 2015-12-03 DIAGNOSIS — Z720 Tobacco use: Secondary | ICD-10-CM

## 2015-12-03 DIAGNOSIS — Z7952 Long term (current) use of systemic steroids: Secondary | ICD-10-CM

## 2015-12-03 DIAGNOSIS — Z880 Allergy status to penicillin: Secondary | ICD-10-CM

## 2015-12-03 DIAGNOSIS — I739 Peripheral vascular disease, unspecified: Secondary | ICD-10-CM | POA: Diagnosis present

## 2015-12-03 LAB — COMPREHENSIVE METABOLIC PANEL
ALBUMIN: 3.6 g/dL (ref 3.5–5.0)
ALK PHOS: 71 U/L (ref 38–126)
ALT: 26 U/L (ref 17–63)
AST: 26 U/L (ref 15–41)
Anion gap: 13 (ref 5–15)
BUN: 15 mg/dL (ref 6–20)
CALCIUM: 9.3 mg/dL (ref 8.9–10.3)
CO2: 25 mmol/L (ref 22–32)
CREATININE: 1.08 mg/dL (ref 0.61–1.24)
Chloride: 101 mmol/L (ref 101–111)
GFR calc Af Amer: >60 mL/min (ref >60)
GFR calc non Af Amer: >60 mL/min (ref >60)
GLUCOSE: 160 mg/dL — AB (ref 65–99)
Potassium: 4.2 mmol/L (ref 3.5–5.1)
SODIUM: 139 mmol/L (ref 135–145)
Total Bilirubin: 0.9 mg/dL (ref 0.3–1.2)
Total Protein: 7.8 g/dL (ref 6.5–8.1)

## 2015-12-03 LAB — CBC
HCT: 46.7 % (ref 39.0–52.0)
HEMOGLOBIN: 15.2 g/dL (ref 13.0–17.0)
MCH: 28.4 pg (ref 26.0–34.0)
MCHC: 32.5 g/dL (ref 30.0–36.0)
MCV: 87.3 fL (ref 78.0–100.0)
PLATELETS: 364 10*3/uL (ref 150–400)
RBC: 5.35 MIL/uL (ref 4.22–5.81)
RDW: 16.6 % — AB (ref 11.5–15.5)
WBC: 18.2 10*3/uL — ABNORMAL HIGH (ref 4.0–10.5)

## 2015-12-03 LAB — URINALYSIS, ROUTINE W REFLEX MICROSCOPIC
Glucose, UA: NEGATIVE mg/dL
HGB URINE DIPSTICK: NEGATIVE
KETONES UR: NEGATIVE mg/dL
Leukocytes, UA: NEGATIVE
Nitrite: NEGATIVE
Protein, ur: NEGATIVE mg/dL
SPECIFIC GRAVITY, URINE: 1.031 — AB (ref 1.005–1.030)
pH: 5 (ref 5.0–8.0)

## 2015-12-03 LAB — I-STAT CG4 LACTIC ACID, ED: LACTIC ACID, VENOUS: 1.62 mmol/L (ref 0.5–2.0)

## 2015-12-03 LAB — LIPASE, BLOOD: Lipase: 21 U/L (ref 11–51)

## 2015-12-03 MED ORDER — ONDANSETRON HCL 4 MG/2ML IJ SOLN
4.0000 mg | Freq: Once | INTRAMUSCULAR | Status: AC
  Administered 2015-12-03: 4 mg via INTRAVENOUS
  Filled 2015-12-03: qty 2

## 2015-12-03 MED ORDER — SODIUM CHLORIDE 0.9 % IV BOLUS (SEPSIS)
1000.0000 mL | Freq: Once | INTRAVENOUS | Status: AC
  Administered 2015-12-03: 1000 mL via INTRAVENOUS

## 2015-12-03 MED ORDER — IOHEXOL 300 MG/ML  SOLN
25.0000 mL | Freq: Once | INTRAMUSCULAR | Status: AC | PRN
  Administered 2015-12-03: 25 mL via ORAL

## 2015-12-03 MED ORDER — IOHEXOL 300 MG/ML  SOLN
100.0000 mL | Freq: Once | INTRAMUSCULAR | Status: AC | PRN
  Administered 2015-12-03: 100 mL via INTRAVENOUS

## 2015-12-03 NOTE — ED Provider Notes (Signed)
CSN: [id number]     Arrival date & time 12/03/15  1522 History   First MD Initiated Contact with Patient 12/03/15 2050     Chief Complaint  Patient presents with  . Abdominal Pain     (Consider location/radiation/quality/duration/timing/severity/associated sxs/prior Treatment) HPI Comments: Patient is a 55 year old male with a history of asthma, esophageal reflux, hypertension, diverticulosis, and sleep apnea. He presents to the emergency department for complaints of abdominal pain which began yesterday. He reports intermittent pain which is waxing and waning in severity. Pain has steadily worsened since onset, per patient. He complains of pain mostly in his lower abdomen. He states that his last bowel movement was 3 days ago. He did pass flatus this morning. Patient also had 2 episodes of emesis today. He denies feeling nauseous at this time and has been able to tolerate fluids prior to arrival. Patient tried laxatives as well as milk of magnesia and an enema today without relief of his symptoms. He denies a history of abdominal surgeries. No associated fevers, chest pain, shortness of breath, or dysuria. Patient's last colonoscopy was 5 years ago. His mother passed from stage IV colon cancer. He is due for another colonoscopy this year.  PCP - Dr. Eloise Harman.  Patient is a 55 y.o. male presenting with abdominal pain. The history is provided by the patient. No language interpreter was used.  Abdominal Pain Associated symptoms: chills, constipation, nausea and vomiting   Associated symptoms: no chest pain, no dysuria, no fever and no shortness of breath     Past Medical History  Diagnosis Date  . Asthma   . GERD (gastroesophageal reflux disease)   . Hypertension   . Rheumatoid arthritis (HCC) dx'd ~ 1977  . Cellulitis of lower extremity     "usually RLL; this time it's got up to my lower abdoment" (02/11/2014)-series of antibiotics completed 02-28-14  . S/P PICC central line placement 03-16-14     right upper arm remains intact.05-11-14 removed 1 week ago.  . OSA (obstructive sleep apnea)     "quit wearing mask several years ago" (02/11/2014)  . Diverticulosis    Past Surgical History  Procedure Laterality Date  . 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 Excisional hemorrhoidectomy    . Revision total knee arthroplasty Right 2012  . Picc line placement Right     right upper arm  . Foreign body removal Left     "BB" removal above left eye-teen yrs.  . Excisional total knee arthroplasty with antibiotic spacers Right 03/18/2014    Procedure: RIGHT KNEE RESECTION ARTHROPLASTY WITH ANTIBIOTIC SPACERS;  Surgeon: Loanne Drilling, MD;  Location: [facility];  Service: Orthopedics;  Laterality: Right;  . Total knee arthroplasty Right 05/21/2014    Procedure: RIGHT KNEE ARTHROPLASTY REINPLANTATION;  Surgeon: Loanne Drilling, MD;  Location: [facility];  Service: Orthopedics;  Laterality: Right;  . Incision and drainage Right 09/28/2014    Procedure: INCISION AND DRAINAGE RIGHT KNEE;  Surgeon: Loanne Drilling, MD;  Location: [facility];  Service: Orthopedics;  Laterality: Right;   Family History  Problem Relation Age of Onset  . Colon cancer Mother   . Hypertension Mother   . Diabetes Mellitus II Paternal Grandmother    Social History  Substance Use Topics  . Smoking status: Former Smoker -- 1.00 packs/day for 3 years    Types: Cigarettes  Quit date: 03/16/1981  . Smokeless tobacco: Current User    Types: Snuff     Comment: "quit smoking cigarettes in the 1980's"  . Alcohol Use: No    Review of Systems  Constitutional: Positive for chills. Negative for fever.  Respiratory: Negative for shortness of breath.   Cardiovascular: Negative for chest pain.  Gastrointestinal: Positive for nausea, vomiting, abdominal pain and constipation.  Genitourinary: Negative for  dysuria.  Neurological: Negative for syncope.  All other systems reviewed and are negative.   Allergies  Avelox; Rocephin; Sulfa antibiotics; Vancomycin; Erythrocin; and Penicillins  Home Medications   Prior to Admission medications   Medication Sig Start Date End Date Taking? Authorizing Provider  ADVAIR DISKUS 250-50 MCG/DOSE AEPB Inhale 1 puff into the lungs daily as needed (shortness of breath).  12/05/10   Historical Provider, MD  diltiazem (CARDIZEM CD) 240 MG 24 hr capsule Take 240 mg by mouth every morning.  11/12/10   Historical Provider, MD  enoxaparin (LOVENOX) 40 MG/0.4ML injection Inject 0.4 mLs (40 mg total) into the skin daily. Lovenox injections for five more days and then switch to a baby 81 mg Aspirin daily for three more weeks at home. 09/30/14   Avel Peace, PA-C  furosemide (LASIX) 40 MG tablet Take 40 mg by mouth every morning.    Historical Provider, MD  losartan (COZAAR) 100 MG tablet Take 100 mg by mouth every morning.  11/12/10   Historical Provider, MD  methocarbamol (ROBAXIN) 500 MG tablet Take 1 tablet (500 mg total) by mouth every 6 (six) hours as needed for muscle spasms. 09/30/14   Avel Peace, PA-C  metoprolol (TOPROL-XL) 50 MG 24 hr tablet Take 50 mg by mouth every morning.  11/12/10   Historical Provider, MD  oxyCODONE (OXY IR/ROXICODONE) 5 MG immediate release tablet Take 1-2 tablets (5-10 mg total) by mouth every 3 (three) hours as needed for moderate pain, severe pain or breakthrough pain. 09/30/14   Avel Peace, PA-C  predniSONE (DELTASONE) 5 MG tablet Take 10 mg by mouth daily with breakfast.  01/22/14   Historical Provider, MD  RABEprazole (ACIPHEX) 20 MG tablet Take 20 mg by mouth daily.    Historical Provider, MD  traMADol (ULTRAM) 50 MG tablet Take 1-2 tablets (50-100 mg total) by mouth every 6 (six) hours as needed (mild pain). 09/30/14   Avel Peace, PA-C  vancomycin 1,250 mg in sodium chloride 0.9 % 250 mL Inject 1,250 mg into the vein every 12  (twelve) hours. Vancomycin every 12 hours twice a day for a total of four weeks via PICC line. 09/30/14   Avel Peace, PA-C   BP 158/116 mmHg  Pulse 107  Temp(Src) 98.8 F (37.1 C) (Oral)  Resp 18  SpO2 97%   Physical Exam  Constitutional: He is oriented to person, place, and time. He appears well-developed and well-nourished. No distress.  Nontoxic/nonseptic appearing  HENT:  Head: Normocephalic and atraumatic.  Eyes: Conjunctivae and EOM are normal. No scleral icterus.  Neck: Normal range of motion.  Cardiovascular: Normal rate, regular rhythm and intact distal pulses.   HR 99bpm in exam room  Pulmonary/Chest: Effort normal and breath sounds normal. No respiratory distress. He has no wheezes. He has no rales.  Respirations even and unlabored.  Abdominal: There is tenderness. There is guarding (minimal voluntary in RLQ). There is no rebound.  Slightly hypoactive bowel sounds. Obese abdomen with epigastric TTP and RLQ TTP. No peritoneal signs. Exam limited secondary to body habitus.  Musculoskeletal: Normal range  of motion.  Neurological: He is alert and oriented to person, place, and time. He exhibits normal muscle tone. Coordination normal.  Patient moving all extremities.  Skin: Skin is warm and dry. No rash noted. He is not diaphoretic. No erythema. No pallor.  Psychiatric: He has a normal mood and affect. His behavior is normal.  Nursing note and vitals reviewed.   ED Course  Procedures (including critical care time) Labs Review Labs Reviewed  COMPREHENSIVE METABOLIC PANEL - Abnormal; Notable for the following:    Glucose, Bld 160 (*)    All other components within normal limits  CBC - Abnormal; Notable for the following:    WBC 18.2 (*)    RDW 16.6 (*)    All other components within normal limits  URINALYSIS, ROUTINE W REFLEX MICROSCOPIC (NOT AT [facility]) - Abnormal; Notable for the following:    Color, Urine AMBER (*)    Specific Gravity, Urine 1.031 (*)    Bilirubin  Urine SMALL (*)    All other components within normal limits  LIPASE, BLOOD  I-STAT CG4 LACTIC ACID, ED    Imaging Review Ct Abdomen Pelvis W Contrast  12/03/2015  CLINICAL DATA:  55 year old male with abdominal pain EXAM: CT ABDOMEN AND PELVIS WITH CONTRAST TECHNIQUE: Multidetector CT imaging of the abdomen and pelvis was performed using the standard protocol following bolus administration of intravenous contrast. CONTRAST:  OMNIPAQUE IOHEXOL 300 MG/ML  SOLN COMPARISON:  Radiograph dated 12/03/2015 and CT dated 02/12/2014 FINDINGS: The visualized lung bases are clear. No intra-abdominal free air. Small free fluid within the pelvis. There multiple small noncalcified gallstones. No pericholecystic fluid. There is a focal area of adenomyomatosis at the gallbladder fundus. The liver, pancreas, spleen, adrenal glands appear unremarkable. A 1 cm left renal inferior pole hypodensity is not well characterized but may represent a cyst. An ill-defined focal area of parenchymal prominence and heterogeneity is seen in the anterior aspect of the lower pole of the left kidney (series 2, image 52). Underlying lesion or infection is not excluded an ill-defined focal area of hypodensity is also noted in the lateral aspect of the right renal inter pole. Correlation with urinalysis and further evaluation with renal ultrasound recommended. There is no hydronephrosis on either side. The the visualized ureters and urinary bladder appear unremarkable. The prostate gland is not well visualized due to streak artifact caused by metallic hip arthroplasties. There is a sub 4.5 cm defect in the right lateral abdominal wall lateral to the rectus abdominus muscle in the region of the transversus abdominus aponeurosis. There is focal herniation of a short segment of terminal ileum with evidence of obstruction. There is inflammatory changes and small amount of fluid in the hernia sac which may represent strangulation/incarceration.  There is dilatation of the bowel loops proximal to this hernia. The appendix extends into this hernia sac but appears unremarkable. The abdominal aorta and IVC appear unremarkable. No portal venous gas identified. There is no adenopathy. Stop midline vertical anterior pelvic wall incisional scar. Degenerative changes of the spine. Bilateral hip arthroplasties. No acute fracture. IMPRESSION: Small-bowel obstruction with transition zone in the terminal ileum at the right lateral abdominal wall hernia defect (Spigelian hernia). There is evidence of strangulation and possible incarceration. Clinical correlation and surgical consult is advised. Minimally heterogeneous kidneys with ill-defined areas of lower attenuation. Ultrasound is recommended for better evaluation. Electronically Signed   By: Elgie Collard M.D.   On: 12/03/2015 23:55   Dg Abd Acute W/chest  12/03/2015  CLINICAL DATA:  Abdominal pain and constipation for 2 days. Vomiting today. EXAM: DG ABDOMEN ACUTE W/ 1V CHEST COMPARISON:  CT 02/12/2014 FINDINGS: The cardiomediastinal contours are normal.  The lungs are clear. There is no free intra-abdominal air. Dilated small bowel loops in the left abdomen measuring up to 5.3 cm, with air-fluid levels. Small volume of stool in the right colon. No radiopaque calculi. No acute osseous abnormalities are seen. Bilateral hip arthroplasties in place. IMPRESSION: Dilated small bowel loops in the left abdomen with air-fluid levels, may reflect early small bowel obstruction versus focal ileus. Small volume of stool in the right colon. Electronically Signed   By: Rubye Oaks M.D.   On: 12/03/2015 16:32   I have personally reviewed and evaluated these images and lab results as part of my medical decision-making.   EKG Interpretation None      12:08 AM [facility] consulted regarding CT results. Case discussed with Dr. Janee Morn who will evaluate the patient in the ED. Anticipate admission to either the surgical  service or [facility]. Patient continues to decline pain medication. He is requesting PO fluids. I have informed him of the need to remain NPO until surgical evaluation.  12:28 AM Patient to be taken to surgery for management of SBO with potential strangulation and incarceration. Additional zofran ordered for onset of emesis.  MDM   Final diagnoses:  Small bowel obstruction (HCC)    Patient to go to OR for management of SBO. Patient evaluated in the ED by Dr. Violeta Gelinas of [facility].   Filed Vitals:   12/03/15 2245 12/03/15 2330 12/03/15 2345 12/04/15 0000  BP: 158/86 156/91 144/91 143/91  Pulse: 94 92 90 92  Temp:      TempSrc:      Resp:      SpO2: 95% 97% 93% 95%     Antony Madura, PA-C 12/04/15 0030  Linwood Dibbles, MD 12/04/15 [phone]

## 2015-12-03 NOTE — ED Notes (Signed)
Taken to CT at this time. 

## 2015-12-03 NOTE — ED Notes (Signed)
Pt notified of the need of a urine specimen.

## 2015-12-03 NOTE — ED Notes (Signed)
Pt reports having lower abd pain since yesterday, pt thinks he has bowel blockage. Had small bowel movement yesterday. This am tried laxatives and enema with no relief and also having n/v.

## 2015-12-04 ENCOUNTER — Encounter (HOSPITAL_COMMUNITY): Admission: EM | Disposition: A | Discharge status: Home / Self Care | Payer: Self-pay | Source: Home / Self Care

## 2015-12-04 ENCOUNTER — Emergency Department (HOSPITAL_COMMUNITY): Payer: 59 | Admitting: Certified Registered"

## 2015-12-04 ENCOUNTER — Encounter (HOSPITAL_COMMUNITY): Payer: Self-pay | Admitting: Certified Registered"

## 2015-12-04 DIAGNOSIS — Z6841 Body Mass Index (BMI) 40.0 and over, adult: Secondary | ICD-10-CM | POA: Diagnosis not present

## 2015-12-04 DIAGNOSIS — Z833 Family history of diabetes mellitus: Secondary | ICD-10-CM | POA: Diagnosis not present

## 2015-12-04 DIAGNOSIS — Z881 Allergy status to other antibiotic agents status: Secondary | ICD-10-CM | POA: Diagnosis not present

## 2015-12-04 DIAGNOSIS — Z96653 Presence of artificial knee joint, bilateral: Secondary | ICD-10-CM | POA: Diagnosis present

## 2015-12-04 DIAGNOSIS — M069 Rheumatoid arthritis, unspecified: Secondary | ICD-10-CM | POA: Diagnosis present

## 2015-12-04 DIAGNOSIS — Z720 Tobacco use: Secondary | ICD-10-CM | POA: Diagnosis not present

## 2015-12-04 DIAGNOSIS — K219 Gastro-esophageal reflux disease without esophagitis: Secondary | ICD-10-CM | POA: Diagnosis present

## 2015-12-04 DIAGNOSIS — I10 Essential (primary) hypertension: Secondary | ICD-10-CM | POA: Diagnosis present

## 2015-12-04 DIAGNOSIS — E669 Obesity, unspecified: Secondary | ICD-10-CM | POA: Diagnosis present

## 2015-12-04 DIAGNOSIS — Z96643 Presence of artificial hip joint, bilateral: Secondary | ICD-10-CM | POA: Diagnosis present

## 2015-12-04 DIAGNOSIS — Z800 Family history of malignant neoplasm of digestive organs: Secondary | ICD-10-CM | POA: Diagnosis not present

## 2015-12-04 DIAGNOSIS — I739 Peripheral vascular disease, unspecified: Secondary | ICD-10-CM | POA: Diagnosis present

## 2015-12-04 DIAGNOSIS — K5669 Other intestinal obstruction: Secondary | ICD-10-CM | POA: Diagnosis present

## 2015-12-04 DIAGNOSIS — J45909 Unspecified asthma, uncomplicated: Secondary | ICD-10-CM | POA: Diagnosis present

## 2015-12-04 DIAGNOSIS — G4733 Obstructive sleep apnea (adult) (pediatric): Secondary | ICD-10-CM | POA: Diagnosis present

## 2015-12-04 DIAGNOSIS — Z79899 Other long term (current) drug therapy: Secondary | ICD-10-CM | POA: Diagnosis not present

## 2015-12-04 DIAGNOSIS — Z880 Allergy status to penicillin: Secondary | ICD-10-CM | POA: Diagnosis not present

## 2015-12-04 DIAGNOSIS — K436 Other and unspecified ventral hernia with obstruction, without gangrene: Secondary | ICD-10-CM | POA: Diagnosis present

## 2015-12-04 DIAGNOSIS — Z7952 Long term (current) use of systemic steroids: Secondary | ICD-10-CM | POA: Diagnosis not present

## 2015-12-04 HISTORY — PX: SPIGELIAN HERNIA: SHX6100

## 2015-12-04 SURGERY — REPAIR, HERNIA, SPIGELIAN
Anesthesia: General | Site: Abdomen | Laterality: Right

## 2015-12-04 MED ORDER — LOSARTAN POTASSIUM 50 MG PO TABS
100.0000 mg | ORAL_TABLET | Freq: Every day | ORAL | Status: DC
  Administered 2015-12-04 – 2015-12-09 (×6): 100 mg via ORAL
  Filled 2015-12-04 (×6): qty 2

## 2015-12-04 MED ORDER — KCL IN DEXTROSE-NACL 20-5-0.45 MEQ/L-%-% IV SOLN
INTRAVENOUS | Status: AC
  Filled 2015-12-04: qty 1000

## 2015-12-04 MED ORDER — ONDANSETRON HCL 4 MG/2ML IJ SOLN
INTRAMUSCULAR | Status: DC | PRN
  Administered 2015-12-04: 4 mg via INTRAVENOUS

## 2015-12-04 MED ORDER — HYDROMORPHONE HCL 1 MG/ML IJ SOLN
INTRAMUSCULAR | Status: AC
  Filled 2015-12-04: qty 1

## 2015-12-04 MED ORDER — ONDANSETRON HCL 4 MG/2ML IJ SOLN
4.0000 mg | Freq: Once | INTRAMUSCULAR | Status: AC
  Administered 2015-12-04: 4 mg via INTRAVENOUS
  Filled 2015-12-04: qty 2

## 2015-12-04 MED ORDER — SUCCINYLCHOLINE CHLORIDE 20 MG/ML IJ SOLN
INTRAMUSCULAR | Status: DC | PRN
  Administered 2015-12-04: 130 mg via INTRAVENOUS

## 2015-12-04 MED ORDER — PROPOFOL 10 MG/ML IV BOLUS
INTRAVENOUS | Status: DC | PRN
  Administered 2015-12-04: 200 mg via INTRAVENOUS

## 2015-12-04 MED ORDER — METOPROLOL SUCCINATE ER 50 MG PO TB24
50.0000 mg | ORAL_TABLET | Freq: Every day | ORAL | Status: DC
  Administered 2015-12-04 – 2015-12-09 (×5): 50 mg via ORAL
  Filled 2015-12-04 (×6): qty 1

## 2015-12-04 MED ORDER — ALBUMIN HUMAN 5 % IV SOLN
INTRAVENOUS | Status: DC | PRN
  Administered 2015-12-04 (×2): via INTRAVENOUS

## 2015-12-04 MED ORDER — HYDROMORPHONE HCL 1 MG/ML IJ SOLN
0.2500 mg | INTRAMUSCULAR | Status: DC | PRN
  Administered 2015-12-04 (×2): 0.5 mg via INTRAVENOUS

## 2015-12-04 MED ORDER — PROPOFOL 10 MG/ML IV BOLUS
INTRAVENOUS | Status: AC
  Filled 2015-12-04: qty 20

## 2015-12-04 MED ORDER — PANTOPRAZOLE SODIUM 40 MG IV SOLR
40.0000 mg | Freq: Every day | INTRAVENOUS | Status: DC
  Administered 2015-12-04 – 2015-12-07 (×4): 40 mg via INTRAVENOUS
  Filled 2015-12-04 (×4): qty 40

## 2015-12-04 MED ORDER — ROCURONIUM BROMIDE 100 MG/10ML IV SOLN
INTRAVENOUS | Status: DC | PRN
  Administered 2015-12-04: 50 mg via INTRAVENOUS

## 2015-12-04 MED ORDER — ENOXAPARIN SODIUM 40 MG/0.4ML ~~LOC~~ SOLN
40.0000 mg | SUBCUTANEOUS | Status: DC
  Administered 2015-12-04 – 2015-12-08 (×5): 40 mg via SUBCUTANEOUS
  Filled 2015-12-04 (×5): qty 0.4

## 2015-12-04 MED ORDER — LACTATED RINGERS IV SOLN
INTRAVENOUS | Status: DC | PRN
  Administered 2015-12-04 (×2): via INTRAVENOUS

## 2015-12-04 MED ORDER — ONDANSETRON 4 MG PO TBDP: 4.0000 mg | ORAL_TABLET | Freq: Four times a day (QID) | ORAL | Status: DC | PRN

## 2015-12-04 MED ORDER — ONDANSETRON HCL 4 MG/2ML IJ SOLN
4.0000 mg | Freq: Four times a day (QID) | INTRAMUSCULAR | Status: DC | PRN
  Administered 2015-12-04 – 2015-12-08 (×5): 4 mg via INTRAVENOUS
  Filled 2015-12-04 (×5): qty 2

## 2015-12-04 MED ORDER — FUROSEMIDE 40 MG PO TABS
40.0000 mg | ORAL_TABLET | Freq: Every day | ORAL | Status: DC
  Administered 2015-12-04 – 2015-12-09 (×6): 40 mg via ORAL
  Filled 2015-12-04 (×6): qty 1

## 2015-12-04 MED ORDER — FENTANYL CITRATE (PF) 100 MCG/2ML IJ SOLN
INTRAMUSCULAR | Status: DC | PRN
  Administered 2015-12-04: 100 ug via INTRAVENOUS

## 2015-12-04 MED ORDER — PROMETHAZINE HCL 25 MG/ML IJ SOLN: 6.2500 mg | INTRAMUSCULAR | Status: DC | PRN

## 2015-12-04 MED ORDER — BUPIVACAINE-EPINEPHRINE 0.25% -1:200000 IJ SOLN
INTRAMUSCULAR | Status: DC | PRN
  Administered 2015-12-04: 20 mL

## 2015-12-04 MED ORDER — METHOTREXATE 2.5 MG PO TABS
15.0000 mg | ORAL_TABLET | ORAL | Status: DC
  Administered 2015-12-07: 15 mg via ORAL
  Filled 2015-12-04: qty 6

## 2015-12-04 MED ORDER — DIPHENHYDRAMINE HCL 25 MG PO CAPS: 25.0000 mg | ORAL_CAPSULE | Freq: Four times a day (QID) | ORAL | Status: DC | PRN

## 2015-12-04 MED ORDER — PHENYLEPHRINE HCL 10 MG/ML IJ SOLN
INTRAMUSCULAR | Status: DC | PRN
  Administered 2015-12-04 (×6): 80 ug via INTRAVENOUS

## 2015-12-04 MED ORDER — MIDAZOLAM HCL 2 MG/2ML IJ SOLN
INTRAMUSCULAR | Status: AC
  Filled 2015-12-04: qty 2

## 2015-12-04 MED ORDER — DIPHENHYDRAMINE HCL 50 MG/ML IJ SOLN: 25.0000 mg | Freq: Four times a day (QID) | INTRAMUSCULAR | Status: DC | PRN

## 2015-12-04 MED ORDER — CLINDAMYCIN PHOSPHATE 600 MG/50ML IV SOLN
600.0000 mg | Freq: Once | INTRAVENOUS | Status: AC
  Administered 2015-12-04: 600 mg via INTRAVENOUS

## 2015-12-04 MED ORDER — KCL IN DEXTROSE-NACL 20-5-0.45 MEQ/L-%-% IV SOLN
INTRAVENOUS | Status: DC
  Administered 2015-12-04 – 2015-12-07 (×8): via INTRAVENOUS
  Filled 2015-12-04 (×8): qty 1000

## 2015-12-04 MED ORDER — HYDROMORPHONE HCL 1 MG/ML IJ SOLN
1.0000 mg | INTRAMUSCULAR | Status: DC | PRN
  Administered 2015-12-04 – 2015-12-05 (×2): 1 mg via INTRAVENOUS
  Filled 2015-12-04 (×2): qty 1

## 2015-12-04 MED ORDER — MIDAZOLAM HCL 5 MG/5ML IJ SOLN
INTRAMUSCULAR | Status: DC | PRN
  Administered 2015-12-04: 2 mg via INTRAVENOUS

## 2015-12-04 MED ORDER — SUGAMMADEX SODIUM 200 MG/2ML IV SOLN
INTRAVENOUS | Status: DC | PRN
  Administered 2015-12-04: 200 mg via INTRAVENOUS

## 2015-12-04 MED ORDER — DILTIAZEM HCL ER COATED BEADS 240 MG PO CP24
240.0000 mg | ORAL_CAPSULE | Freq: Every day | ORAL | Status: DC
  Administered 2015-12-04 – 2015-12-09 (×6): 240 mg via ORAL
  Filled 2015-12-04 (×6): qty 1

## 2015-12-04 MED ORDER — PHENOL 1.4 % MT LIQD
1.0000 | OROMUCOSAL | Status: DC | PRN
  Administered 2015-12-04: 1 via OROMUCOSAL
  Filled 2015-12-04: qty 177

## 2015-12-04 MED ORDER — FENTANYL CITRATE (PF) 250 MCG/5ML IJ SOLN
INTRAMUSCULAR | Status: AC
  Filled 2015-12-04: qty 5

## 2015-12-04 MED ORDER — FENTANYL CITRATE (PF) 100 MCG/2ML IJ SOLN: INTRAMUSCULAR | Status: DC | PRN

## 2015-12-04 SURGICAL SUPPLY — 41 items
BLADE SURG ROTATE 9660 (MISCELLANEOUS) ×2 IMPLANT
CANISTER SUCTION 2500CC (MISCELLANEOUS) ×3 IMPLANT
CHLORAPREP W/TINT 26ML (MISCELLANEOUS) ×3 IMPLANT
COVER SURGICAL LIGHT HANDLE (MISCELLANEOUS) ×3 IMPLANT
DRAPE LAPAROSCOPIC ABDOMINAL (DRAPES) ×3 IMPLANT
DRAPE UTILITY XL STRL (DRAPES) ×6 IMPLANT
ELECT BLADE 4.0 EZ CLEAN MEGAD (MISCELLANEOUS) ×3
ELECT CAUTERY BLADE 6.4 (BLADE) ×3 IMPLANT
ELECT REM PT RETURN 9FT ADLT (ELECTROSURGICAL) ×3
ELECTRODE BLDE 4.0 EZ CLN MEGD (MISCELLANEOUS) IMPLANT
ELECTRODE REM PT RTRN 9FT ADLT (ELECTROSURGICAL) ×1 IMPLANT
GAUZE SPONGE 4X4 12PLY STRL (GAUZE/BANDAGES/DRESSINGS) IMPLANT
GLOVE BIO SURGEON STRL SZ8 (GLOVE) ×7 IMPLANT
GLOVE BIOGEL PI IND STRL 8 (GLOVE) ×1 IMPLANT
GLOVE BIOGEL PI INDICATOR 8 (GLOVE) ×2
GOWN STRL REUS W/ TWL LRG LVL3 (GOWN DISPOSABLE) ×2 IMPLANT
GOWN STRL REUS W/ TWL XL LVL3 (GOWN DISPOSABLE) ×1 IMPLANT
GOWN STRL REUS W/TWL LRG LVL3 (GOWN DISPOSABLE) ×6
GOWN STRL REUS W/TWL XL LVL3 (GOWN DISPOSABLE) ×3
KIT BASIN OR (CUSTOM PROCEDURE TRAY) ×3 IMPLANT
KIT ROOM TURNOVER OR (KITS) ×3 IMPLANT
LIQUID BAND (GAUZE/BANDAGES/DRESSINGS) IMPLANT
MESH VENTRALEX ST 8CM LRG (Mesh General) ×2 IMPLANT
NEEDLE 22X1 1/2 (OR ONLY) (NEEDLE) ×3 IMPLANT
NS IRRIG 1000ML POUR BTL (IV SOLUTION) ×3 IMPLANT
PACK GENERAL/GYN (CUSTOM PROCEDURE TRAY) ×3 IMPLANT
PAD ARMBOARD 7.5X6 YLW CONV (MISCELLANEOUS) ×5 IMPLANT
SPONGE GAUZE 4X4 12PLY STER LF (GAUZE/BANDAGES/DRESSINGS) ×2 IMPLANT
STAPLER VISISTAT 35W (STAPLE) IMPLANT
SUT MNCRL AB 4-0 PS2 18 (SUTURE) ×3 IMPLANT
SUT NOVA NAB DX-16 0-1 5-0 T12 (SUTURE) ×4 IMPLANT
SUT PROLENE 0 CT 2 (SUTURE) IMPLANT
SUT VIC AB 2-0 CT1 36 (SUTURE) ×2 IMPLANT
SUT VIC AB 3-0 SH 27 (SUTURE) ×3
SUT VIC AB 3-0 SH 27X BRD (SUTURE) ×1 IMPLANT
SYR CONTROL 10ML LL (SYRINGE) ×3 IMPLANT
TAPE CLOTH SURG 6X10 WHT LF (GAUZE/BANDAGES/DRESSINGS) ×2 IMPLANT
TOWEL OR 17X24 6PK STRL BLUE (TOWEL DISPOSABLE) ×3 IMPLANT
TOWEL OR 17X26 10 PK STRL BLUE (TOWEL DISPOSABLE) ×3 IMPLANT
TRAY FOLEY CATH 14FRSI W/METER (CATHETERS) IMPLANT
WATER STERILE IRR 1000ML POUR (IV SOLUTION) IMPLANT

## 2015-12-04 NOTE — Anesthesia Procedure Notes (Signed)
Procedure Name: Intubation Date/Time: 12/04/2015 1:22 AM Performed by: Arlice Colt B Pre-anesthesia Checklist: Patient identified, Emergency Drugs available, Suction available, Patient being monitored and Timeout performed Patient Re-evaluated:Patient Re-evaluated prior to inductionOxygen Delivery Method: Circle system utilized Preoxygenation: Pre-oxygenation with 100% oxygen Intubation Type: IV induction, Rapid sequence and Cricoid Pressure applied Laryngoscope Size: Mac and 3 Grade View: Grade I Tube type: Oral Tube size: 7.5 mm Number of attempts: 1 Airway Equipment and Method: Stylet Placement Confirmation: ETT inserted through vocal cords under direct vision,  positive ETCO2 and breath sounds checked- equal and bilateral Secured at: 22 cm Tube secured with: Tape Dental Injury: Teeth and Oropharynx as per pre-operative assessment

## 2015-12-04 NOTE — Op Note (Signed)
12/03/2015 - 12/04/2015  2:24 AM  PATIENT:  [name]  54 y.o. male  PRE-OPERATIVE DIAGNOSIS:  incarcerated spigelian hernia  POST-OPERATIVE DIAGNOSIS:  incarcerated spigelian hernia  PROCEDURE:  Procedure(s): INCARCERATED SPIGELIAN HERNIA REPAIR WITH MESH 8CM VENTRALEX  SURGEON:  Surgeon(s): Violeta Gelinas, MD  ASSISTANTS: none   ANESTHESIA:   local and general  EBL:  Total I/O In: 1500 [I.V.:1000; IV Piggyback:500] Out: -   BLOOD ADMINISTERED:none  DRAINS: none   SPECIMEN:  Excision  DISPOSITION OF SPECIMEN:  PATHOLOGY  COUNTS:  YES  DICTATION: .Dragon Dictation Findings: Bowel had spontaneously reduced, hernia still contained incarcerated fat  Procedure in detail: [name] is brought for emergent repair of incarcerated spigelian hernia right lower quadrant with small bowel obstruction. Informed consent was obtained. He received intravenous antibiotics. He is brought to the operating room and general endotracheal anesthesia was administered by the anesthesia staff. His abdomen was prepped and draped in a sterile fashion. We did time out procedure. Right lower quadrant incision was made over the vaguely palpable mass. Subcutaneous tissues were dissected down achieving good hemostasis. We identified the external fascial layer and there was palpable mass beneath that. External fascial layer was opened carefully. Underneath, the hernia sac was identified. This was freed up from the surrounding interfascial layers and then the sac was opened. The bowel had spontaneously reduced. The sac only contained some incarcerated fat which was necrotic. This was excised and sent to pathology. The hernia sac wall also contained some incarcerated fat and this was excised and sent to pathology. The hernia defect was small. Viable omentum and local bowel was visualized in limited fashion. No evidence of ischemia. The hernia was then repaired with an 8 cm Ventralex mesh. This was placed inside the  defect and it laid flat nicely up against the inside of the abdominal wall. Next, the 2 leaflets on the top of the mesh were secured to the inner layers of fascia on each side using #1 Novafil. I then closed the external fascial layer over the top of the mesh and incorporated the mesh in the closure using interrupted #1 Novafil sutures. Local was injected. Hemostasis was ensured. Subcutaneous tissues were irrigated. Subcutaneous tissues were closed with interrupted 2-0 Vicryl. The skin was closed with staples. All counts were correct. He tolerated procedure well without apparent complication was taken recovery in stable condition. PATIENT DISPOSITION:  PACU - hemodynamically stable.   Delay start of Pharmacological VTE agent (>24hrs) due to surgical blood loss or risk of bleeding:  no  Violeta Gelinas, MD, MPH, FACS Pager: [phone]  2/19/20172:24 AM

## 2015-12-04 NOTE — H&P (Signed)
[name] is an 55 y.o. male.   Chief Complaint: RLQ abdominal pain, vomiting HPI: [name] developed right lower quadrant pain on Thursday. It has persisted. He developed vomiting yesterday. He has not eaten anything since Friday afternoon. He came to the emergency department for further evaluation. He has had pains in this area before on and off for the past several months but has not sought medical attention for it. Evaluation here demonstrates leukocytosis of 18,200. 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 It contains the distal ileum and appendix. I was asked to see him for admission and treatment.   Past Medical History  Diagnosis Date  . Asthma   . GERD (gastroesophageal reflux disease)   . Hypertension   . Rheumatoid arthritis ([place]) dx'd ~ 1977  . Cellulitis of lower extremity     "usually RLL; this time it's got up to my lower abdoment" (02/11/2014)-series of antibiotics completed 02-28-14  . S/P PICC central line placement 03-16-14    right upper arm remains intact.05-11-14 removed 1 week ago.  . OSA (obstructive sleep apnea)     "quit wearing mask several years ago" (02/11/2014)  . Diverticulosis     Past Surgical History  Procedure Laterality Date  . 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 Excisional hemorrhoidectomy    . Revision total knee arthroplasty Right 2012  . Picc line placement Right     right upper arm  . Foreign body removal Left     "BB" removal above left eye-teen yrs.  . Excisional total knee arthroplasty with antibiotic spacers Right 03/18/2014    Procedure: RIGHT KNEE RESECTION ARTHROPLASTY WITH ANTIBIOTIC SPACERS;  Surgeon: [name], MD;  Location: [facility];  Service: Orthopedics;  Laterality: Right;  . Total knee arthroplasty Right  05/21/2014    Procedure: RIGHT KNEE ARTHROPLASTY REINPLANTATION;  Surgeon: [name], MD;  Location: [facility];  Service: Orthopedics;  Laterality: Right;  . Incision and drainage Right 09/28/2014    Procedure: INCISION AND DRAINAGE RIGHT KNEE;  Surgeon: [name], MD;  Location: [facility];  Service: Orthopedics;  Laterality: Right;    Family History  Problem Relation Age of Onset  . Colon cancer Mother   . Hypertension Mother   . Diabetes Mellitus II Paternal Grandmother    Social History:  reports that he quit smoking about 34 years ago. His smoking use included Cigarettes. He has a 3 pack-year smoking history. His smokeless tobacco use includes Snuff. He reports that he does not drink alcohol or use illicit drugs.  Allergies:  Allergies  Allergen Reactions  . Avelox [Moxifloxacin Hcl In Nacl] Hives, Shortness Of Breath and Itching  . Rocephin [Ceftriaxone Sodium In Dextrose] Hives and Itching  . Sulfa Antibiotics Itching  . Vancomycin Hives and Itching  . Erythrocin Rash  . Penicillins Rash    Has patient had a PCN reaction causing immediate rash, facial/tongue/throat swelling, SOB or lightheadedness with hypotension: Yes Has patient had a PCN reaction causing severe rash involving mucus membranes or skin necrosis: No Has patient had a PCN reaction that required hospitalization No Has patient had a PCN reaction occurring within the last 10 years: No If all of the above answers are "NO", then may proceed with Cephalosporin use.     (  Not in a hospital admission)  Results for orders placed or performed during the hospital encounter of 12/03/15 (from the past 48 hour(s))  Lipase, blood     Status: None   Collection Time: 12/03/15  3:50 PM  Result Value Ref Range   Lipase 21 11 - 51 U/L  Comprehensive metabolic panel     Status: Abnormal   Collection Time: 12/03/15  3:50 PM  Result Value Ref Range   Sodium 139 135 - 145 mmol/L   Potassium 4.2 3.5 - 5.1 mmol/L   Chloride  101 101 - 111 mmol/L   CO2 25 22 - 32 mmol/L   Glucose, Bld 160 (H) 65 - 99 mg/dL   BUN 15 6 - 20 mg/dL   Creatinine, Ser 1.08 0.61 - 1.24 mg/dL   Calcium 9.3 8.9 - 10.3 mg/dL   Total Protein 7.8 6.5 - 8.1 g/dL   Albumin 3.6 3.5 - 5.0 g/dL   AST 26 15 - 41 U/L   ALT 26 17 - 63 U/L   Alkaline Phosphatase 71 38 - 126 U/L   Total Bilirubin 0.9 0.3 - 1.2 mg/dL   GFR calc non Af Amer >60 >60 mL/min   GFR calc Af Amer >60 >60 mL/min    Comment: (NOTE) The eGFR has been calculated using the CKD EPI equation. This calculation has not been validated in all clinical situations. eGFR's persistently <60 mL/min signify possible Chronic Kidney Disease.    Anion gap 13 5 - 15  CBC     Status: Abnormal   Collection Time: 12/03/15  3:50 PM  Result Value Ref Range   WBC 18.2 (H) 4.0 - 10.5 K/uL   RBC 5.35 4.22 - 5.81 MIL/uL   Hemoglobin 15.2 13.0 - 17.0 g/dL   HCT 46.7 39.0 - 52.0 %   MCV 87.3 78.0 - 100.0 fL   MCH 28.4 26.0 - 34.0 pg   MCHC 32.5 30.0 - 36.0 g/dL   RDW 16.6 (H) 11.5 - 15.5 %   Platelets 364 150 - 400 K/uL  Urinalysis, Routine w reflex microscopic (not at [facility])     Status: Abnormal   Collection Time: 12/03/15  9:44 PM  Result Value Ref Range   Color, Urine AMBER (A) YELLOW    Comment: BIOCHEMICALS MAY BE AFFECTED BY COLOR   APPearance CLEAR CLEAR   Specific Gravity, Urine 1.031 (H) 1.005 - 1.030   pH 5.0 5.0 - 8.0   Glucose, UA NEGATIVE NEGATIVE mg/dL   Hgb urine dipstick NEGATIVE NEGATIVE   Bilirubin Urine SMALL (A) NEGATIVE   Ketones, ur NEGATIVE NEGATIVE mg/dL   Protein, ur NEGATIVE NEGATIVE mg/dL   Nitrite NEGATIVE NEGATIVE   Leukocytes, UA NEGATIVE NEGATIVE    Comment: MICROSCOPIC NOT DONE ON URINES WITH NEGATIVE PROTEIN, BLOOD, LEUKOCYTES, NITRITE, OR GLUCOSE <1000 mg/dL.  I-Stat CG4 Lactic Acid, ED     Status: None   Collection Time: 12/03/15  9:48 PM  Result Value Ref Range   Lactic Acid, Venous 1.62 0.5 - 2.0 mmol/L   Ct Abdomen Pelvis W  Contrast  12/03/2015  CLINICAL DATA:  55 year old male with abdominal pain EXAM: CT ABDOMEN AND PELVIS WITH CONTRAST TECHNIQUE: Multidetector CT imaging of the abdomen and pelvis was performed using the standard protocol following bolus administration of intravenous contrast. CONTRAST:  121m OMNIPAQUE IOHEXOL 300 MG/ML  SOLN COMPARISON:  Radiograph dated 12/03/2015 and CT dated 02/12/2014 FINDINGS: The visualized lung bases are clear. No intra-abdominal free air. Small free fluid within the  pelvis. There multiple small noncalcified gallstones. No pericholecystic fluid. There is a focal area of adenomyomatosis at the gallbladder fundus. The liver, pancreas, spleen, adrenal glands appear unremarkable. A 1 cm left renal inferior pole hypodensity is not well characterized but may represent a cyst. An ill-defined focal area of parenchymal prominence and heterogeneity is seen in the anterior aspect of the lower pole of the left kidney (series 2, image 52). Underlying lesion or infection is not excluded an ill-defined focal area of hypodensity is also noted in the lateral aspect of the right renal inter pole. Correlation with urinalysis and further evaluation with renal ultrasound recommended. There is no hydronephrosis on either side. The the visualized ureters and urinary bladder appear unremarkable. The prostate gland is not well visualized due to streak artifact caused by metallic hip arthroplasties. There is a sub 4.5 cm defect in the right lateral abdominal wall lateral to the rectus abdominus muscle in the region of the transversus abdominus aponeurosis. There is focal herniation of a short segment of terminal ileum with evidence of obstruction. There is inflammatory changes and small amount of fluid in the hernia sac which may represent strangulation/incarceration. There is dilatation of the bowel loops proximal to this hernia. The appendix extends into this hernia sac but appears unremarkable. The abdominal  aorta and IVC appear unremarkable. No portal venous gas identified. There is no adenopathy. Stop midline vertical anterior pelvic wall incisional scar. Degenerative changes of the spine. Bilateral hip arthroplasties. No acute fracture. IMPRESSION: Small-bowel obstruction with transition zone in the terminal ileum at the right lateral abdominal wall hernia defect (Spigelian hernia). There is evidence of strangulation and possible incarceration. Clinical correlation and surgical consult is advised. Minimally heterogeneous kidneys with ill-defined areas of lower attenuation. Ultrasound is recommended for better evaluation. Electronically Signed   By: Anner Crete M.D.   On: 12/03/2015 23:55   Dg Abd Acute W/chest  12/03/2015  CLINICAL DATA:  Abdominal pain and constipation for 2 days. Vomiting today. EXAM: DG ABDOMEN ACUTE W/ 1V CHEST COMPARISON:  CT 02/12/2014 FINDINGS: The cardiomediastinal contours are normal.  The lungs are clear. There is no free intra-abdominal air. Dilated small bowel loops in the left abdomen measuring up to 5.3 cm, with air-fluid levels. Small volume of stool in the right colon. No radiopaque calculi. No acute osseous abnormalities are seen. Bilateral hip arthroplasties in place. IMPRESSION: Dilated small bowel loops in the left abdomen with air-fluid levels, may reflect early small bowel obstruction versus focal ileus. Small volume of stool in the right colon. Electronically Signed   By: Jeb Levering M.D.   On: 12/03/2015 16:32    Review of Systems  Constitutional: Negative for fever and chills.  HENT: Negative.   Eyes: Negative.   Respiratory: Negative for cough.   Cardiovascular: Negative for chest pain.  Gastrointestinal: Positive for nausea, vomiting and abdominal pain.  Genitourinary: Negative.   Musculoskeletal: Negative.   Skin: Negative.   Neurological: Negative.   Endo/Heme/Allergies: Negative.   Psychiatric/Behavioral: Negative.     Blood pressure  143/91, pulse 92, temperature 98.8 F (37.1 C), temperature source Oral, resp. rate 18, SpO2 95 %. Physical Exam  Constitutional: He is oriented to person, place, and time. He appears well-developed and well-nourished. No distress.  HENT:  Head: Normocephalic and atraumatic.  Right Ear: External ear normal.  Left Ear: External ear normal.  Nose: Nose normal.  Mouth/Throat: Oropharynx is clear and moist.  Eyes: EOM are normal. Pupils are equal, round, and reactive to  light.  Neck: Normal range of motion. Neck supple. No tracheal deviation present.  Cardiovascular: Normal rate, regular rhythm, normal heart sounds and intact distal pulses.   Respiratory: Effort normal and breath sounds normal. No stridor. No respiratory distress. He has no wheezes. He has no rales.  GI: Soft. He exhibits no distension. There is tenderness. There is no rebound and no guarding.  Tender fullness palpable in the right lower quadrant  Musculoskeletal: Normal range of motion.  Neurological: He is alert and oriented to person, place, and time.  Skin: Skin is warm.  Psychiatric: He has a normal mood and affect.     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 I recommend emergent surgery. We'll plan repair of incarcerated spine given hernia with mesh. Procedure, risks, and benefits were discussed in detail with him and his wife. He is agreeable.  Zenovia Jarred, MD 12/04/2015, 12:31 AM

## 2015-12-04 NOTE — Progress Notes (Signed)
Patient refused CPAP tonight. There Isn't a machine in the room at this time. RN aware. Explained to Patient that if they changed their mind, to just have the RN call Respiratory and we would come set them up. Stated he hasn't worn his CPAP for 5+ years.

## 2015-12-04 NOTE — Progress Notes (Signed)
Day of Surgery  Subjective:  7 hours postop Stable and alert.  Pain control seems adequate although sore.  NG in place with bilious drainage. Urine output reasonable.  Urine clear. Heart rate 91.  BP 131/86.  SPO2 93% on room air.  Objective: Vital signs in last 24 hours: Temp:  [98.2 F (36.8 C)-98.8 F (37.1 C)] 98.6 F (37 C) (02/19 0415) Pulse Rate:  [84-107] 91 (02/19 0415) Resp:  [13-22] 19 (02/19 0415) BP: (123-165)/(71-116) 131/86 mmHg (02/19 0415) SpO2:  [88 %-99 %] 93 % (02/19 0415) Weight:  [150.2 kg (331 lb 2.1 oz)] 150.2 kg (331 lb 2.1 oz) (02/19 0415) Last BM Date: 12/01/15  Intake/Output from previous day: 02/18 0701 - 02/19 0700 In: 2105 [I.V.:1575; NG/GT:30; IV Piggyback:500] Out: 300 [Emesis/NG output:300] Intake/Output this shift: Total I/O In: 0  Out: 500 [Urine:500]   EXAM: General appearance: Alert.  Cooperative.  Mild distress.  Large man. Resp: Clear to auscultation anteriorly.  Decreased breath sounds bases.  No wheeze GI: Obese.  Soft.  Appropriately tender.  Wound clean.  Silent.  Lab Results:  Results for orders placed or performed during the hospital encounter of 12/03/15 (from the past 24 hour(s))  Lipase, blood     Status: None   Collection Time: 12/03/15  3:50 PM  Result Value Ref Range   Lipase 21 11 - 51 U/L  Comprehensive metabolic panel     Status: Abnormal   Collection Time: 12/03/15  3:50 PM  Result Value Ref Range   Sodium 139 135 - 145 mmol/L   Potassium 4.2 3.5 - 5.1 mmol/L   Chloride 101 101 - 111 mmol/L   CO2 25 22 - 32 mmol/L   Glucose, Bld 160 (H) 65 - 99 mg/dL   BUN 15 6 - 20 mg/dL   Creatinine, Ser 5.42 0.61 - 1.24 mg/dL   Calcium 9.3 8.9 - 70.6 mg/dL   Total Protein 7.8 6.5 - 8.1 g/dL   Albumin 3.6 3.5 - 5.0 g/dL   AST 26 15 - 41 U/L   ALT 26 17 - 63 U/L   Alkaline Phosphatase 71 38 - 126 U/L   Total Bilirubin 0.9 0.3 - 1.2 mg/dL   GFR calc non Af Amer >60 >60 mL/min   GFR calc Af Amer >60 >60 mL/min   Anion  gap 13 5 - 15  CBC     Status: Abnormal   Collection Time: 12/03/15  3:50 PM  Result Value Ref Range   WBC 18.2 (H) 4.0 - 10.5 K/uL   RBC 5.35 4.22 - 5.81 MIL/uL   Hemoglobin 15.2 13.0 - 17.0 g/dL   HCT 23.7 62.8 - 31.5 %   MCV 87.3 78.0 - 100.0 fL   MCH 28.4 26.0 - 34.0 pg   MCHC 32.5 30.0 - 36.0 g/dL   RDW 17.6 (H) 16.0 - 73.7 %   Platelets 364 150 - 400 K/uL  Urinalysis, Routine w reflex microscopic (not at [facility])     Status: Abnormal   Collection Time: 12/03/15  9:44 PM  Result Value Ref Range   Color, Urine AMBER (A) YELLOW   APPearance CLEAR CLEAR   Specific Gravity, Urine 1.031 (H) 1.005 - 1.030   pH 5.0 5.0 - 8.0   Glucose, UA NEGATIVE NEGATIVE mg/dL   Hgb urine dipstick NEGATIVE NEGATIVE   Bilirubin Urine SMALL (A) NEGATIVE   Ketones, ur NEGATIVE NEGATIVE mg/dL   Protein, ur NEGATIVE NEGATIVE mg/dL   Nitrite NEGATIVE NEGATIVE   Leukocytes,  UA NEGATIVE NEGATIVE  I-Stat CG4 Lactic Acid, ED     Status: None   Collection Time: 12/03/15  9:48 PM  Result Value Ref Range   Lactic Acid, Venous 1.62 0.5 - 2.0 mmol/L     Studies/Results: Ct Abdomen Pelvis W Contrast  12/03/2015  CLINICAL DATA:  56 year old male with abdominal pain EXAM: CT ABDOMEN AND PELVIS WITH CONTRAST TECHNIQUE: Multidetector CT imaging of the abdomen and pelvis was performed using the standard protocol following bolus administration of intravenous contrast. CONTRAST:  OMNIPAQUE IOHEXOL 300 MG/ML  SOLN COMPARISON:  Radiograph dated 12/03/2015 and CT dated 02/12/2014 FINDINGS: The visualized lung bases are clear. No intra-abdominal free air. Small free fluid within the pelvis. There multiple small noncalcified gallstones. No pericholecystic fluid. There is a focal area of adenomyomatosis at the gallbladder fundus. The liver, pancreas, spleen, adrenal glands appear unremarkable. A 1 cm left renal inferior pole hypodensity is not well characterized but may represent a cyst. An ill-defined focal area of  parenchymal prominence and heterogeneity is seen in the anterior aspect of the lower pole of the left kidney (series 2, image 52). Underlying lesion or infection is not excluded an ill-defined focal area of hypodensity is also noted in the lateral aspect of the right renal inter pole. Correlation with urinalysis and further evaluation with renal ultrasound recommended. There is no hydronephrosis on either side. The the visualized ureters and urinary bladder appear unremarkable. The prostate gland is not well visualized due to streak artifact caused by metallic hip arthroplasties. There is a sub 4.5 cm defect in the right lateral abdominal wall lateral to the rectus abdominus muscle in the region of the transversus abdominus aponeurosis. There is focal herniation of a short segment of terminal ileum with evidence of obstruction. There is inflammatory changes and small amount of fluid in the hernia sac which may represent strangulation/incarceration. There is dilatation of the bowel loops proximal to this hernia. The appendix extends into this hernia sac but appears unremarkable. The abdominal aorta and IVC appear unremarkable. No portal venous gas identified. There is no adenopathy. Stop midline vertical anterior pelvic wall incisional scar. Degenerative changes of the spine. Bilateral hip arthroplasties. No acute fracture. IMPRESSION: Small-bowel obstruction with transition zone in the terminal ileum at the right lateral abdominal wall hernia defect (Spigelian hernia). There is evidence of strangulation and possible incarceration. Clinical correlation and surgical consult is advised. Minimally heterogeneous kidneys with ill-defined areas of lower attenuation. Ultrasound is recommended for better evaluation. Electronically Signed   By: Elgie Collard M.D.   On: 12/03/2015 23:55   Dg Abd Acute W/chest  12/03/2015  CLINICAL DATA:  Abdominal pain and constipation for 2 days. Vomiting today. EXAM: DG ABDOMEN ACUTE  W/ 1V CHEST COMPARISON:  CT 02/12/2014 FINDINGS: The cardiomediastinal contours are normal.  The lungs are clear. There is no free intra-abdominal air. Dilated small bowel loops in the left abdomen measuring up to 5.3 cm, with air-fluid levels. Small volume of stool in the right colon. No radiopaque calculi. No acute osseous abnormalities are seen. Bilateral hip arthroplasties in place. IMPRESSION: Dilated small bowel loops in the left abdomen with air-fluid levels, may reflect early small bowel obstruction versus focal ileus. Small volume of stool in the right colon. Electronically Signed   By: Rubye Oaks M.D.   On: 12/03/2015 16:32    . dextrose 5 % and 0.45 % NaCl with KCl 20 mEq/L      . enoxaparin (LOVENOX) injection  40 mg Subcutaneous  Q24H  . HYDROmorphone         Assessment/Plan: s/p Procedure(s): INCARCERATED SPIGELIAN HERNIA REPAIR WITH MESH  7 hours postop.  Repair incarcerated right spigelian hernia with SBO Stable Continue NG since obstructed for 48 hours or more with dilated small bowel Incentive spirometry Mobilize out of bed Intake and output strict VTE prophylaxis-Lovenox and SCDs  Obesity GERD Hypertension Obstructive sleep apnea.  Doesn't wear mask  @PROBHOSP @  LOS: 0 days    Phila Shoaf M 12/04/2015  . .prob

## 2015-12-04 NOTE — Anesthesia Postprocedure Evaluation (Signed)
Anesthesia Post Note  Patient: [name]  Procedure(s) Performed: Procedure(s) (LRB): INCARCERATED SPIGELIAN HERNIA REPAIR WITH MESH (Right)  Patient location during evaluation: PACU Anesthesia Type: General Level of consciousness: awake Pain management: pain level controlled Vital Signs Assessment: post-procedure vital signs reviewed and stable Respiratory status: spontaneous breathing Cardiovascular status: stable Anesthetic complications: no    Last Vitals:  Filed Vitals:   12/04/15 0228 12/04/15 0230  BP:  145/74  Pulse: 86 85  Temp: 36.9 C   Resp: 18 18    Last Pain:  Filed Vitals:   12/04/15 0241  PainSc: 6                  EDWARDS,Marshae Azam

## 2015-12-04 NOTE — Progress Notes (Signed)
Pt ambulated about on unit using front wheel walker. Currently sitting up in chair

## 2015-12-04 NOTE — Anesthesia Preprocedure Evaluation (Addendum)
Anesthesia Evaluation  Patient identified by MRN, date of birth, ID band Patient awake    Reviewed: Allergy & Precautions, NPO status , Patient's Chart, lab work & pertinent test results  History of Anesthesia Complications (+) PONV  Airway Mallampati: I  TM Distance: >3 FB Neck ROM: Full    Dental  (+) Poor Dentition, Missing, Chipped   Pulmonary asthma , sleep apnea , former smoker,    breath sounds clear to auscultation       Cardiovascular hypertension, Pt. on medications and Pt. on home beta blockers + Peripheral Vascular Disease   Rhythm:Regular Rate:Normal     Neuro/Psych    GI/Hepatic Neg liver ROS, GERD  ,History noted. CE   Endo/Other  negative endocrine ROS  Renal/GU negative Renal ROS     Musculoskeletal  (+) Arthritis ,   Abdominal   Peds  Hematology  (+) anemia ,   Anesthesia Other Findings   Reproductive/Obstetrics                            Anesthesia Physical Anesthesia Plan  ASA: III and emergent  Anesthesia Plan: General   Post-op Pain Management:    Induction: Intravenous, Rapid sequence and Cricoid pressure planned  Airway Management Planned: Oral ETT  Additional Equipment:   Intra-op Plan:   Post-operative Plan: Extubation in OR  Informed Consent: I have reviewed the patients History and Physical, chart, labs and discussed the procedure including the risks, benefits and alternatives for the proposed anesthesia with the patient or authorized representative who has indicated his/her understanding and acceptance.   Dental advisory given  Plan Discussed with: Anesthesiologist, Surgeon and CRNA  Anesthesia Plan Comments:        Anesthesia Quick Evaluation

## 2015-12-04 NOTE — Progress Notes (Signed)
Utilization review completed.  

## 2015-12-04 NOTE — Transfer of Care (Signed)
Immediate Anesthesia Transfer of Care Note  Patient: [name]  Procedure(s) Performed: Procedure(s): INCARCERATED SPIGELIAN HERNIA REPAIR WITH MESH (Right)  Patient Location: PACU  Anesthesia Type:General  Level of Consciousness: awake, alert  and oriented  Airway & Oxygen Therapy: Patient Spontanous Breathing  Post-op Assessment: Report given to RN and Post -op Vital signs reviewed and stable  Post vital signs: Reviewed and stable  Last Vitals:  Filed Vitals:   12/04/15 0030 12/04/15 0059  BP: 144/89   Pulse: 99   Temp:  37.1 C  Resp:      Complications: No apparent anesthesia complications

## 2015-12-04 NOTE — ED Notes (Signed)
Taken to OR at this time.  Report given to OR nurse.  Clothing and watch removed and given to wife.

## 2015-12-04 NOTE — Procedures (Signed)
Pt states that he does not use a CPAP at home.  RT instructed PACU RN to place pt on VM if pt desats due to NG (which will cause CPAP to leak) and pt's wishes for not using the machine.

## 2015-12-04 NOTE — Progress Notes (Signed)
D; Resp.tx person came on the way to floor,       Report given to [facility] per CPAP order and resp.tx person told use venti mask 50%

## 2015-12-05 ENCOUNTER — Encounter (HOSPITAL_COMMUNITY): Payer: Self-pay | Admitting: General Surgery

## 2015-12-05 LAB — CBC
HCT: 37 % — ABNORMAL LOW (ref 39.0–52.0)
HEMOGLOBIN: 11.4 g/dL — AB (ref 13.0–17.0)
MCH: 27.9 pg (ref 26.0–34.0)
MCHC: 30.8 g/dL (ref 30.0–36.0)
MCV: 90.7 fL (ref 78.0–100.0)
Platelets: 224 10*3/uL (ref 150–400)
RBC: 4.08 MIL/uL — ABNORMAL LOW (ref 4.22–5.81)
RDW: 17 % — AB (ref 11.5–15.5)
WBC: 9.9 10*3/uL (ref 4.0–10.5)

## 2015-12-05 LAB — BASIC METABOLIC PANEL
Anion gap: 5 (ref 5–15)
BUN: 9 mg/dL (ref 6–20)
CHLORIDE: 104 mmol/L (ref 101–111)
CO2: 28 mmol/L (ref 22–32)
Calcium: 8.2 mg/dL — ABNORMAL LOW (ref 8.9–10.3)
Creatinine, Ser: 0.9 mg/dL (ref 0.61–1.24)
GFR calc Af Amer: >60 mL/min (ref >60)
GFR calc non Af Amer: >60 mL/min (ref >60)
GLUCOSE: 134 mg/dL — AB (ref 65–99)
Potassium: 4.2 mmol/L (ref 3.5–5.1)
SODIUM: 137 mmol/L (ref 135–145)

## 2015-12-05 NOTE — Progress Notes (Signed)
1 Day Post-Op  Subjective: Passed flatus, good pain control  Objective: Vital signs in last 24 hours: Temp:  [98.2 F (36.8 C)-98.6 F (37 C)] 98.6 F (37 C) (02/20 0624) Pulse Rate:  [72-85] 85 (02/20 0624) Resp:  [18-19] 18 (02/20 0624) BP: (134-165)/(64-85) 150/73 mmHg (02/20 0624) SpO2:  [96 %-98 %] 98 % (02/20 0624) Last BM Date: 12/01/15  Intake/Output from previous day: 02/19 0701 - 02/20 0700 In: 320  Out: 1500 [Urine:1000; Emesis/NG output:500] Intake/Output this shift:    General appearance: cooperative Resp: clear to auscultation bilaterally Cardio: regular rate and rhythm  Abd: soft, incision mild erythema/ecchymosis, active BS  Lab Results:   Recent Labs  12/03/15 1550 12/05/15 0637  WBC 18.2* 9.9  HGB 15.2 11.4*  HCT 46.7 37.0*  PLT 364 224   BMET  Recent Labs  12/03/15 1550 12/05/15 0637  NA 139 137  K 4.2 4.2  CL 101 104  CO2 25 28  GLUCOSE 160* 134*  BUN 15 9  CREATININE 1.08 0.90  CALCIUM 9.3 8.2*   Anti-infectives: Anti-infectives    Start     Dose/Rate Route Frequency Ordered Stop   12/04/15 0045  clindamycin (CLEOCIN) IVPB 600 mg     600 mg 100 mL/hr over 30 Minutes Intravenous  Once 12/04/15 0040 12/04/15 0125      Assessment/Plan: s/p Procedure(s): INCARCERATED SPIGELIAN HERNIA REPAIR WITH MESH (Right) POD#1 D/C NGT and start clears Decrease IVF Mobilize VTE - lovenox  LOS: 1 day    Aanya Haynes E 12/05/2015

## 2015-12-06 LAB — BASIC METABOLIC PANEL
ANION GAP: 10 (ref 5–15)
BUN: 8 mg/dL (ref 6–20)
CALCIUM: 8.7 mg/dL — AB (ref 8.9–10.3)
CO2: 26 mmol/L (ref 22–32)
CREATININE: 1 mg/dL (ref 0.61–1.24)
Chloride: 101 mmol/L (ref 101–111)
GFR calc Af Amer: >60 mL/min (ref >60)
GLUCOSE: 122 mg/dL — AB (ref 65–99)
Potassium: 4 mmol/L (ref 3.5–5.1)
Sodium: 137 mmol/L (ref 135–145)

## 2015-12-06 LAB — CBC
HEMATOCRIT: 38.9 % — AB (ref 39.0–52.0)
Hemoglobin: 12.1 g/dL — ABNORMAL LOW (ref 13.0–17.0)
MCH: 27.8 pg (ref 26.0–34.0)
MCHC: 31.1 g/dL (ref 30.0–36.0)
MCV: 89.4 fL (ref 78.0–100.0)
PLATELETS: 272 10*3/uL (ref 150–400)
RBC: 4.35 MIL/uL (ref 4.22–5.81)
RDW: 16.5 % — AB (ref 11.5–15.5)
WBC: 9.2 10*3/uL (ref 4.0–10.5)

## 2015-12-06 MED ORDER — ALUM & MAG HYDROXIDE-SIMETH 200-200-20 MG/5ML PO SUSP
30.0000 mL | ORAL | Status: DC | PRN
Start: 2015-12-06 — End: 2015-12-09
  Administered 2015-12-06 – 2015-12-08 (×4): 30 mL via ORAL
  Filled 2015-12-06 (×4): qty 30

## 2015-12-06 NOTE — Progress Notes (Signed)
Pt had 2 episodes of vomiting this afternoon, mostly bile with some undigested food particles from his last meal (2/17). Has had a couple of small stools today. Abdomen still distended, and heartburn continues.

## 2015-12-06 NOTE — Progress Notes (Signed)
2 Days Post-Op  Subjective: 3 BM but also vomited and C/O heartburn  Objective: Vital signs in last 24 hours: Temp:  [97.9 F (36.6 C)-98 F (36.7 C)] 98 F (36.7 C) (02/21 0500) Pulse Rate:  [60-66] 60 (02/21 0500) Resp:  [18-20] 18 (02/21 0500) BP: (131-144)/(52-75) 144/75 mmHg (02/21 0500) SpO2:  [97 %-98 %] 97 % (02/21 0500) Last BM Date: 12/05/15  Intake/Output from previous day: 02/20 0701 - 02/21 0700 In: 1320 [P.O.:240; I.V.:1080] Out: 1451 [Urine:1450; Stool:1] Intake/Output this shift:    GI: soft, incision mild ecchymosis but no drainage, active BS  Lab Results:   Recent Labs  12/05/15 0637 12/06/15 0639  WBC 9.9 9.2  HGB 11.4* 12.1*  HCT 37.0* 38.9*  PLT 224 272   BMET  Recent Labs  12/05/15 0637 12/06/15 0639  NA 137 137  K 4.2 4.0  CL 104 101  CO2 28 26  GLUCOSE 134* 122*  BUN 9 8  CREATININE 0.90 1.00  CALCIUM 8.2* 8.7*   PT/INR No results for input(s): LABPROT, INR in the last 72 hours. ABG No results for input(s): PHART, HCO3 in the last 72 hours.  Invalid input(s): PCO2, PO2  Studies/Results: No results found.  Anti-infectives: Anti-infectives    Start     Dose/Rate Route Frequency Ordered Stop   12/04/15 0045  clindamycin (CLEOCIN) IVPB 600 mg     600 mg 100 mL/hr over 30 Minutes Intravenous  Once 12/04/15 0040 12/04/15 0125      Assessment/Plan: S/P repair incarcerated Spigelian hernia POD#2 Continue clears Add Maalox PRN HTN - Cozaar, metoprolol VTE - Lovenox  LOS: 2 days    Azell Bill E 12/06/2015

## 2015-12-07 MED ORDER — FOLIC ACID 1 MG PO TABS
1.0000 mg | ORAL_TABLET | Freq: Every day | ORAL | Status: DC
  Administered 2015-12-07 – 2015-12-09 (×3): 1 mg via ORAL
  Filled 2015-12-07 (×3): qty 1

## 2015-12-07 MED ORDER — BISACODYL 10 MG RE SUPP
10.0000 mg | Freq: Every day | RECTAL | Status: DC | PRN
  Administered 2015-12-08: 10 mg via RECTAL
  Filled 2015-12-07: qty 1

## 2015-12-07 NOTE — Care Management Note (Signed)
Case Management Note  Patient Details  Name: [name] MRN: [id number] Date of Birth: [birth year]  Subjective/Objective:                    Action/Plan:   Expected Discharge Date:                  Expected Discharge Plan:  Home/Self Care  In-House Referral:     Discharge planning Services     Post Acute Care Choice:    Choice offered to:     DME Arranged:    DME Agency:     HH Arranged:    HH Agency:     Status of Service:  In process, will continue to follow  Medicare Important Message Given:    Date Medicare IM Given:    Medicare IM give by:    Date Additional Medicare IM Given:    Additional Medicare Important Message give by:     If discussed at Long Length of Stay Meetings, dates discussed:    Additional Comments: UR updated  Kingsley Plan, RN 12/07/2015, 2:35 PM

## 2015-12-07 NOTE — Progress Notes (Signed)
3 Days Post-Op  Subjective: Pt with some emesis last night.  Is passing flatus and having small BMs but feels distended  Objective: Vital signs in last 24 hours: Temp:  [98.3 F (36.8 C)-98.5 F (36.9 C)] 98.5 F (36.9 C) (02/22 0416) Pulse Rate:  [60] 60 (02/22 0416) Resp:  [18-19] 19 (02/22 0416) BP: (112-134)/(49-65) 134/65 mmHg (02/22 0416) SpO2:  [99 %] 99 % (02/22 0416) Last BM Date: 12/06/15  Intake/Output from previous day: 02/21 0701 - 02/22 0700 In: 570 [I.V.:570] Out: -  Intake/Output this shift:    General appearance: alert and cooperative GI: soft, min dist, staples in place, c/d/i  Lab Results:   Recent Labs  12/05/15 0637 12/06/15 0639  WBC 9.9 9.2  HGB 11.4* 12.1*  HCT 37.0* 38.9*  PLT 224 272   BMET  Recent Labs  12/05/15 0637 12/06/15 0639  NA 137 137  K 4.2 4.0  CL 104 101  CO2 28 26  GLUCOSE 134* 122*  BUN 9 8  CREATININE 0.90 1.00  CALCIUM 8.2* 8.7*   PT/INR No results for input(s): LABPROT, INR in the last 72 hours. ABG No results for input(s): PHART, HCO3 in the last 72 hours.  Invalid input(s): PCO2, PO2  Studies/Results: No results found.  Anti-infectives: Anti-infectives    Start     Dose/Rate Route Frequency Ordered Stop   12/04/15 0045  clindamycin (CLEOCIN) IVPB 600 mg     600 mg 100 mL/hr over 30 Minutes Intravenous  Once 12/04/15 0040 12/04/15 0125      Assessment/Plan: s/p Procedure(s): INCARCERATED SPIGELIAN HERNIA REPAIR WITH MESH (Right) Make NPO 2/2 to ileus Mobilize PRN supp IVF   LOS: 3 days    Marigene Ehlers., [facility] 12/07/2015

## 2015-12-08 MED ORDER — ACETAMINOPHEN 325 MG PO TABS: 650.0000 mg | ORAL_TABLET | Freq: Four times a day (QID) | ORAL | Status: DC | PRN

## 2015-12-08 MED ORDER — PANTOPRAZOLE SODIUM 40 MG PO TBEC
40.0000 mg | DELAYED_RELEASE_TABLET | Freq: Every day | ORAL | Status: DC
  Administered 2015-12-08: 40 mg via ORAL
  Filled 2015-12-08: qty 1

## 2015-12-08 MED ORDER — POLYETHYLENE GLYCOL 3350 17 G PO PACK
17.0000 g | PACK | Freq: Every day | ORAL | Status: DC | PRN
  Administered 2015-12-09: 17 g via ORAL
  Filled 2015-12-08: qty 1

## 2015-12-08 MED ORDER — OXYCODONE-ACETAMINOPHEN 5-325 MG PO TABS: 1.0000 | ORAL_TABLET | ORAL | Status: DC | PRN

## 2015-12-08 NOTE — Progress Notes (Signed)
Pt complained of vomitiitng x1 at 1930, unable to assess vomitus since pt vomitted in the bathroom but stated that it was greenish in color, zofran iv 4mg  given

## 2015-12-08 NOTE — Discharge Instructions (Signed)
[facility] _______[facility]  UMBILICAL OR INGUINAL HERNIA REPAIR: POST OP INSTRUCTIONS  Always review your discharge instruction sheet given to you by the facility where your surgery was performed. IF YOU HAVE DISABILITY OR FAMILY LEAVE FORMS, YOU MUST BRING THEM TO THE OFFICE FOR PROCESSING.   DO NOT GIVE THEM TO YOUR DOCTOR.  1. A  prescription for pain medication may be given to you upon discharge.  Take your pain medication as prescribed, if needed.  If narcotic pain medicine is not needed, then you may take acetaminophen (Tylenol) or ibuprofen (Advil) as needed. 2. Take your usually prescribed medications unless otherwise directed. 3. If you need a refill on your pain medication, please contact your pharmacy.  They will contact our office to request authorization. Prescriptions will not be filled after 5 pm or on week-ends. 4. You should follow a light diet the first 24 hours after arrival home, such as soup and crackers, etc.  Be sure to include lots of fluids daily.  Resume your normal diet the day after surgery. 5. Most patients will experience some swelling and bruising around the umbilicus or in the groin and scrotum.  Ice packs and reclining will help.  Swelling and bruising can take several days to resolve.  6. It is common to experience some constipation if taking pain medication after surgery.  Increasing fluid intake and taking a stool softener (such as Colace) will usually help or prevent this problem from occurring.  A mild laxative (Milk of Magnesia or Miralax) should be taken according to package directions if there are no bowel movements after 48 hours. 7. Unless discharge instructions indicate otherwise, you may remove your bandages 24-48 hours after surgery, and you may shower at that time.  You may have steri-strips (small skin tapes) in place directly over the incision.  These strips should be left on the skin for 7-10 days.  If your surgeon used skin glue on the  incision, you may shower in 24 hours.  The glue will flake off over the next 2-3 weeks.  Any sutures or staples will be removed at the office during your follow-up visit. 8. ACTIVITIES:  You may resume regular (light) daily activities beginning the next day--such as daily self-care, walking, climbing stairs--gradually increasing activities as tolerated.  You may have sexual intercourse when it is comfortable.  Refrain from any heavy lifting or straining until approved by your doctor. a. You may drive when you are no longer taking prescription pain medication, you can comfortably wear a seatbelt, and you can safely maneuver your car and apply brakes. b. RETURN TO WORK:  __________________________________________________________ 9. You should see your doctor in the office for a follow-up appointment approximately 2-3 weeks after your surgery.  Make sure that you call for this appointment within a day or two after you arrive home to insure a convenient appointment time. 10. OTHER INSTRUCTIONS:  __________________________________________________________________________________________________________________________________________________________________________________________  WHEN TO CALL YOUR DOCTOR: 1. Fever over 101.0 2. Inability to urinate 3. Nausea and/or vomiting 4. Extreme swelling or bruising 5. Continued bleeding from incision. 6. Increased pain, redness, or drainage from the incision  The clinic staff is available to answer your questions during regular business hours.  Please don't hesitate to call and ask to speak to one of the nurses for clinical concerns.  If you have a medical emergency, go to the nearest emergency room or call 911.  A surgeon from [facility] is always on call at the hospital     [address], [address], [place], [place]  [address] ?  [address] [address], [place], [place]   [address] [phone] ? [phone] ? FAX [phone] Web site:  [email or web address]  

## 2015-12-08 NOTE — Clinical Documentation Improvement (Signed)
General Surgery  (Query responses must be documented in the current medical record, not on the CDI BPA form in [facility].)  Please document if a condition below provides greater specificity regarding the patient's "obesity and BMI of 50.3".  Possible Clinical Conditions:  - Morbid Obesity  - Other conditions  - Unable to clinically determine  Clinical Information/Indicators: "Body mass index is 50.3" documented in progress note 12/08/15 "Obesity" documented in progress note 12/04/15   Please exercise your independent, professional judgment when responding. A specific answer is not anticipated or expected.   Thank You, Jerral Ralph  RN BSN [facility] [phone] [facility] [place]

## 2015-12-08 NOTE — Care Management Note (Signed)
Case Management Note  Patient Details  Name: [name] MRN: [id number] Date of Birth: 1961-01-09  Subjective/Objective:                    Action/Plan:   Expected Discharge Date:                  Expected Discharge Plan:  Home/Self Care  In-House Referral:     Discharge planning Services     Post Acute Care Choice:    Choice offered to:     DME Arranged:    DME Agency:     HH Arranged:    HH Agency:     Status of Service:  In process, will continue to follow  Medicare Important Message Given:    Date Medicare IM Given:    Medicare IM give by:    Date Additional Medicare IM Given:    Additional Medicare Important Message give by:     If discussed at Long Length of Stay Meetings, dates discussed:  12-08-15  Additional Comments: UR updated  Kingsley Plan, RN 12/08/2015, 11:35 AM

## 2015-12-08 NOTE — Progress Notes (Signed)
4 Days Post-Op  Subjective: He did well with clears, had another BM.  Site looks good and he is motivated to go home.    Objective: Vital signs in last 24 hours: Temp:  [98 F (36.7 C)-98.8 F (37.1 C)] 98.8 F (37.1 C) (02/23 0456) Pulse Rate:  [59-60] 60 (02/23 0456) Resp:  [19-20] 19 (02/23 0456) BP: (130-156)/(82-87) 151/84 mmHg (02/23 0456) SpO2:  [97 %-100 %] 97 % (02/23 0456) Last BM Date: 12/07/15 PO 820  Restarted clears last PM BM x 1 Afebrile, VSS Labs OK yesterday Intake/Output from previous day: 02/22 0701 - 02/23 0700 In: 820 [P.O.:820] Out: -  Intake/Output this shift:    General appearance: alert, cooperative, no distress and anxious to progress Resp: clear to auscultation bilaterally GI: soft, hard to get up, but moves well.  +BS, + BM, and site looks good.  Lab Results:   Recent Labs  12/06/15 0639  WBC 9.2  HGB 12.1*  HCT 38.9*  PLT 272    BMET  Recent Labs  12/06/15 0639  NA 137  K 4.0  CL 101  CO2 26  GLUCOSE 122*  BUN 8  CREATININE 1.00  CALCIUM 8.7*   PT/INR No results for input(s): LABPROT, INR in the last 72 hours.   Recent Labs Lab 12/03/15 1550  AST 26  ALT 26  ALKPHOS 71  BILITOT 0.9  PROT 7.8  ALBUMIN 3.6     Lipase     Component Value Date/Time   LIPASE 21 12/03/2015 1550     Studies/Results: No results found.  Medications: . diltiazem  240 mg Oral Daily  . enoxaparin (LOVENOX) injection  40 mg Subcutaneous Q24H  . folic acid  1 mg Oral Daily  . furosemide  40 mg Oral Daily  . losartan  100 mg Oral Daily  . methotrexate  15 mg Oral Q Wed  . metoprolol succinate  50 mg Oral Daily  . pantoprazole (PROTONIX) IV  40 mg Intravenous QHS   . dextrose 5 % and 0.45 % NaCl with KCl 20 mEq/L 75 mL/hr at 12/07/15 1140   Prior to Admission medications   Medication Sig Start Date End Date Taking? Authorizing Provider  acetaminophen (TYLENOL) 500 MG tablet Take 1,000 mg by mouth every 6 (six) hours as needed  (pain).   Yes Historical Provider, MD  clindamycin (CLEOCIN) 300 MG capsule Take 300 mg by mouth See admin instructions. Take 1 capsule (300 mg) by mouth one hour before dental appointment and 1 capsule (300 mg) an hour after. 10/12/15  Yes Historical Provider, MD  diltiazem (CARDIZEM CD) 240 MG 24 hr capsule Take 240 mg by mouth daily.  11/12/10  Yes Historical Provider, MD  Fluticasone-Salmeterol (ADVAIR) 250-50 MCG/DOSE AEPB Inhale 1 puff into the lungs 2 (two) times daily as needed (wheezing).   Yes Historical Provider, MD  folic acid (FOLVITE) 1 MG tablet Take 1 mg by mouth daily.   Yes Historical Provider, MD  furosemide (LASIX) 40 MG tablet Take 40 mg by mouth daily.    Yes Historical Provider, MD  losartan (COZAAR) 100 MG tablet Take 100 mg by mouth daily.  11/12/10  Yes Historical Provider, MD  methotrexate (RHEUMATREX) 2.5 MG tablet Take 15 mg by mouth every Wednesday. 10/12/15  Yes Historical Provider, MD  metoprolol (TOPROL-XL) 50 MG 24 hr tablet Take 50 mg by mouth daily.  11/12/10  Yes Historical Provider, MD  Oxycodone HCl 10 MG TABS Take 10 mg by mouth daily as  needed (pain).   Yes Historical Provider, MD  predniSONE (DELTASONE) 5 MG tablet Take 10 mg by mouth See admin instructions. Take 1 tablet (5 mg) by mouth daily for 3 days as needed for pain from rheumatoid arthritis 01/22/14  Yes Historical Provider, MD  RABEprazole (ACIPHEX) 20 MG tablet Take 20 mg by mouth daily.   Yes Historical Provider, MD  Adalimumab (HUMIRA) 40 MG/0.8ML PSKT Inject 40 mg into the skin every 14 (fourteen) days.    Historical Provider, MD  methocarbamol (ROBAXIN) 500 MG tablet Take 1 tablet (500 mg total) by mouth every 6 (six) hours as needed for muscle spasms. Patient not taking: Reported on 12/03/2015 09/30/14   Avel Peace, PA-C  oxyCODONE (OXY IR/ROXICODONE) 5 MG immediate release tablet Take 1-2 tablets (5-10 mg total) by mouth every 3 (three) hours as needed for moderate pain, severe pain or  breakthrough pain. Patient not taking: Reported on 12/03/2015 09/30/14   Avel Peace, PA-C  traMADol (ULTRAM) 50 MG tablet Take 1-2 tablets (50-100 mg total) by mouth every 6 (six) hours as needed (mild pain). Patient not taking: Reported on 12/03/2015 09/30/14   Avel Peace, PA-C     Assessment/Plan S/P repair incarcerated Spigelian hernia, 12/04/15, Dr. Violeta Gelinas Post op ileus Hypertension Rheumatoid arthritis OSA GERD Body mass index is 50.3 Antibiotics:  None DVT:  Lovenox/SCD   Plan:  Continue to mobilize, advance diet and if he does well aim for d/c home tomorrow.    LOS: 4 days    Tammi Boulier 12/08/2015

## 2015-12-09 LAB — BASIC METABOLIC PANEL
Anion gap: 11 (ref 5–15)
BUN: 11 mg/dL (ref 6–20)
CHLORIDE: 99 mmol/L — AB (ref 101–111)
CO2: 26 mmol/L (ref 22–32)
CREATININE: 1.24 mg/dL (ref 0.61–1.24)
Calcium: 8.9 mg/dL (ref 8.9–10.3)
GFR calc Af Amer: >60 mL/min (ref >60)
GLUCOSE: 94 mg/dL (ref 65–99)
Potassium: 4.2 mmol/L (ref 3.5–5.1)
SODIUM: 136 mmol/L (ref 135–145)

## 2015-12-09 LAB — CBC
HCT: 42.7 % (ref 39.0–52.0)
Hemoglobin: 14 g/dL (ref 13.0–17.0)
MCH: 29.1 pg (ref 26.0–34.0)
MCHC: 32.8 g/dL (ref 30.0–36.0)
MCV: 88.8 fL (ref 78.0–100.0)
PLATELETS: 332 10*3/uL (ref 150–400)
RBC: 4.81 MIL/uL (ref 4.22–5.81)
RDW: 16.5 % — ABNORMAL HIGH (ref 11.5–15.5)
WBC: 9.7 10*3/uL (ref 4.0–10.5)

## 2015-12-09 LAB — C DIFFICILE QUICK SCREEN W PCR REFLEX
C DIFFICLE (CDIFF) ANTIGEN: NEGATIVE
C Diff interpretation: NEGATIVE
C Diff toxin: NEGATIVE

## 2015-12-09 MED ORDER — ACETAMINOPHEN 325 MG PO TABS: 650.0000 mg | ORAL_TABLET | Freq: Four times a day (QID) | ORAL | Status: DC | PRN

## 2015-12-09 MED ORDER — OXYCODONE-ACETAMINOPHEN 5-325 MG PO TABS: 1.0000 | ORAL_TABLET | ORAL | Status: DC | PRN

## 2015-12-09 MED ORDER — ONDANSETRON 4 MG PO TBDP: 4.0000 mg | ORAL_TABLET | Freq: Four times a day (QID) | ORAL | Status: DC | PRN

## 2015-12-09 NOTE — Progress Notes (Signed)
5 Days Post-Op  Subjective: He vomited again last PM, says he has had another BM, but says it's all liquid, no solid content, the ones before that were also liquid.  He doesn't really feel bad otherwise.  Has not eaten much even with the soft diet.  Objective: Vital signs in last 24 hours: Temp:  [97.9 F (36.6 C)-98.4 F (36.9 C)] 97.9 F (36.6 C) (02/24 0621) Pulse Rate:  [55-65] 64 (02/24 0621) Resp:  [18-20] 18 (02/24 0621) BP: (138-155)/(75-87) 138/75 mmHg (02/24 0621) SpO2:  [97 %-99 %] 97 % (02/24 0621) Last BM Date: 12/08/15 360 PO recorded  Soft diet BM x 4 recorded Vomited x 1 last PM Afebrile, VSS No labs Intake/Output from previous day: 02/23 0701 - 02/24 0700 In: 3778.8 [P.O.:360; I.V.:3368.8; IV Piggyback:50] Out: -  Intake/Output this shift:    General appearance: alert, cooperative and no distress Resp: clear to auscultation bilaterally GI: soft, sore incision looks fine.  his abdomen is not tender.    Lab Results:  No results for input(s): WBC, HGB, HCT, PLT in the last 72 hours.  BMET No results for input(s): NA, K, CL, CO2, GLUCOSE, BUN, CREATININE, CALCIUM in the last 72 hours. PT/INR No results for input(s): LABPROT, INR in the last 72 hours.   Recent Labs Lab 12/03/15 1550  AST 26  ALT 26  ALKPHOS 71  BILITOT 0.9  PROT 7.8  ALBUMIN 3.6     Lipase     Component Value Date/Time   LIPASE 21 12/03/2015 1550     Studies/Results: No results found.  Medications: . diltiazem  240 mg Oral Daily  . enoxaparin (LOVENOX) injection  40 mg Subcutaneous Q24H  . folic acid  1 mg Oral Daily  . furosemide  40 mg Oral Daily  . losartan  100 mg Oral Daily  . methotrexate  15 mg Oral Q Wed  . metoprolol succinate  50 mg Oral Daily  . pantoprazole  40 mg Oral QHS    Assessment/Plan S/P repair incarcerated Spigelian hernia, 12/04/15, Dr. Violeta Gelinas Post op ileus Hypertension Rheumatoid arthritis OSA GERD Body mass index is  50.3 Antibiotics: None DVT: Lovenox/SCD   Plan:  I am going to check labs and C diff, I doubt he has it but I don't want to send him home with liquid stools and no labs for 3 days.  If every thing is good we can send him later today.      LOS: 5 days    [name] 12/09/2015

## 2015-12-09 NOTE — Progress Notes (Signed)
IV removed. AVS given to patient, understanding demonstrated.  Belongings packed. Transportation with wife.

## 2015-12-12 IMAGING — CT CT ABD-PELV W/ CM
2 of 6 series · 15 of 46 positions shown, 17 images · IV contrast (APPLIED)
Comparison: None.

CLINICAL DATA: Abdominal pain and burning sensation on skin.

EXAM:
CT ABDOMEN AND PELVIS WITH CONTRAST
TECHNIQUE: Multidetector CT imaging of the abdomen and pelvis was performed
using the standard protocol following bolus administration of
intravenous contrast.
CONTRAST:  100mL OMNIPAQUE IOHEXOL 300 MG/ML  SOLN

[Series 2: abd/ pelvis 5.0 i30f 1 · axial · 0.98mm/px · z∈[+352,+827]mm · 12 of 109 slices shown, 14 images]
[im 7/109  soft-tissue]
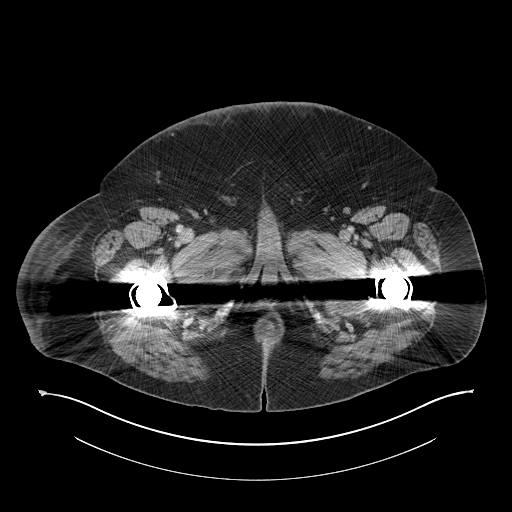
[im 7/109  bone]
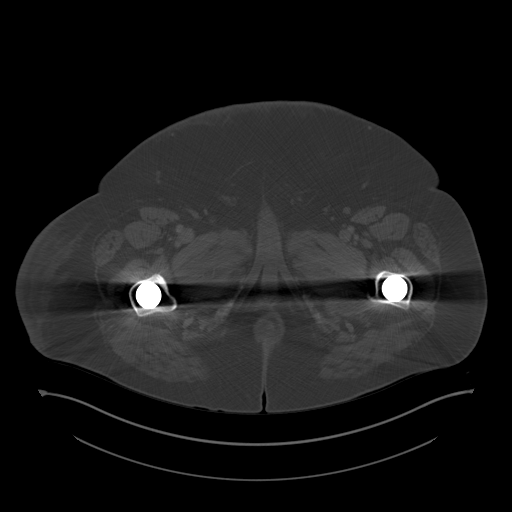
[im 14/109  soft-tissue]
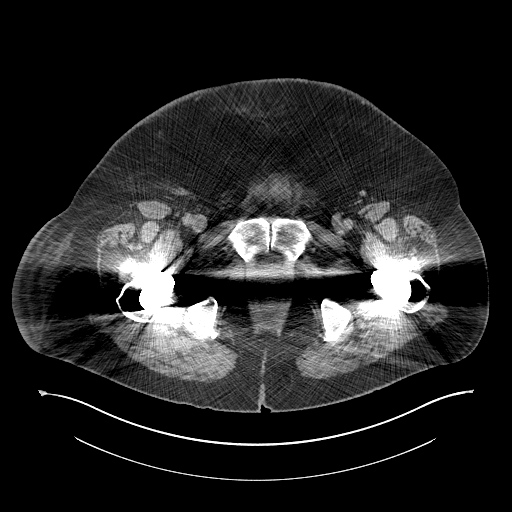
[im 28/109  soft-tissue]
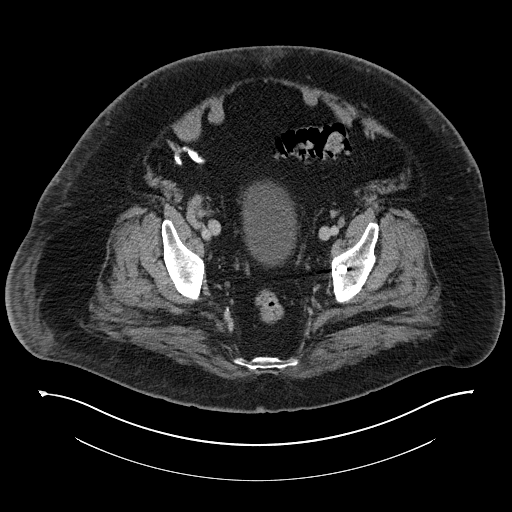
[im 34/109  soft-tissue]
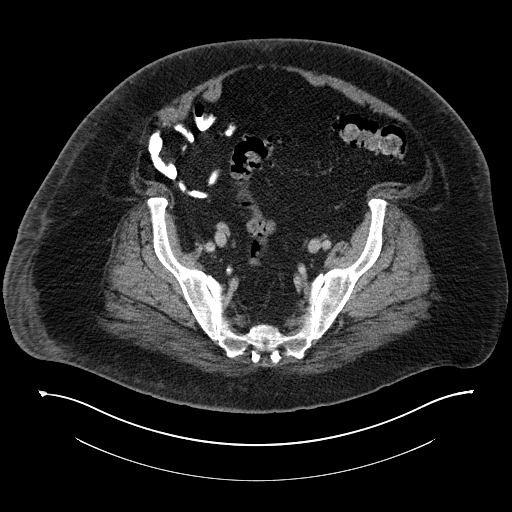
[im 41/109  soft-tissue]
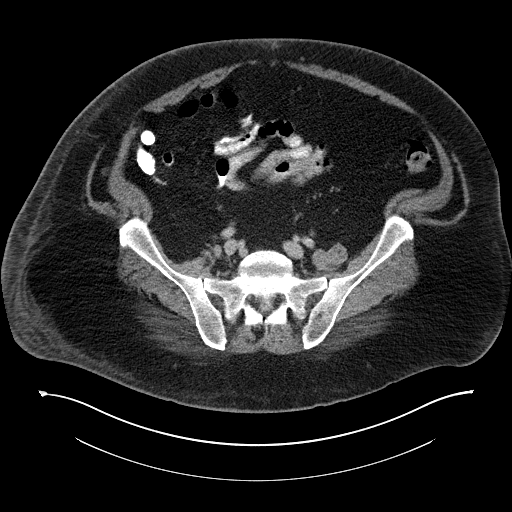
[im 48/109  soft-tissue]
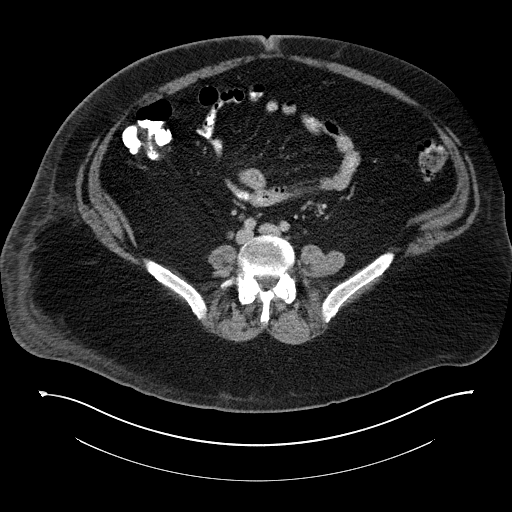
[im 61/109  soft-tissue]
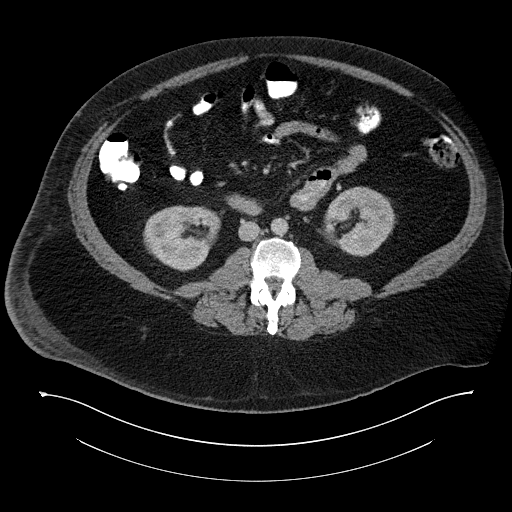
[im 68/109  soft-tissue]
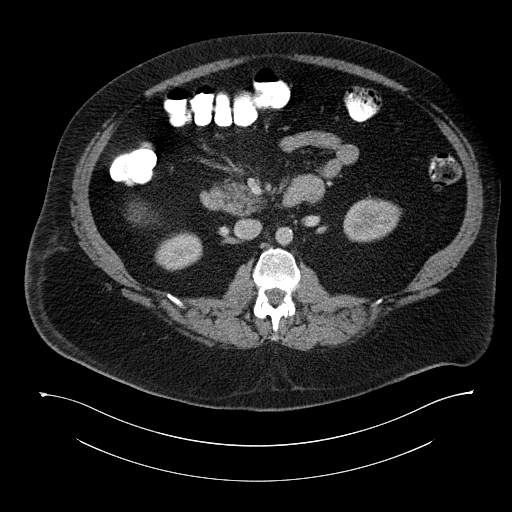
[im 75/109  soft-tissue]
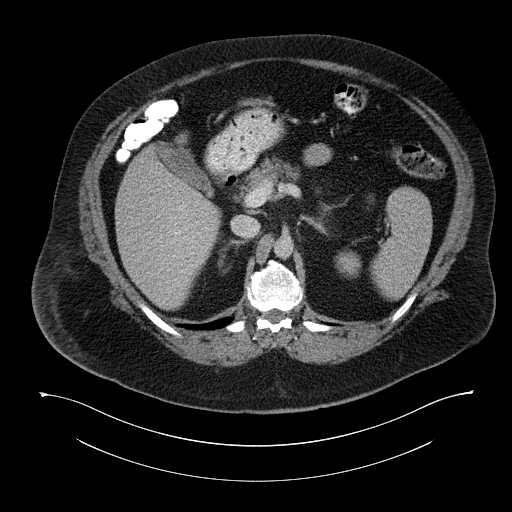
[im 75/109  bone]
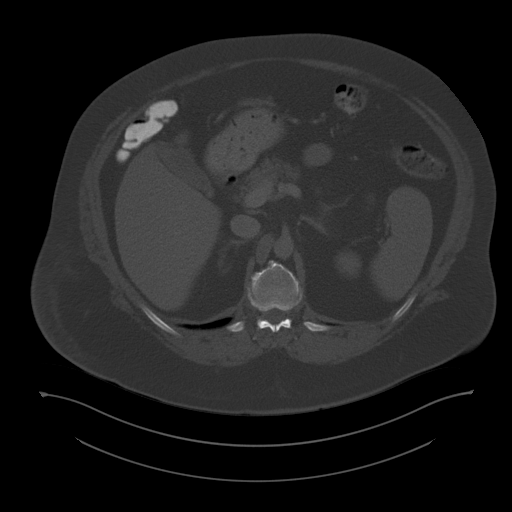
[im 82/109  soft-tissue]
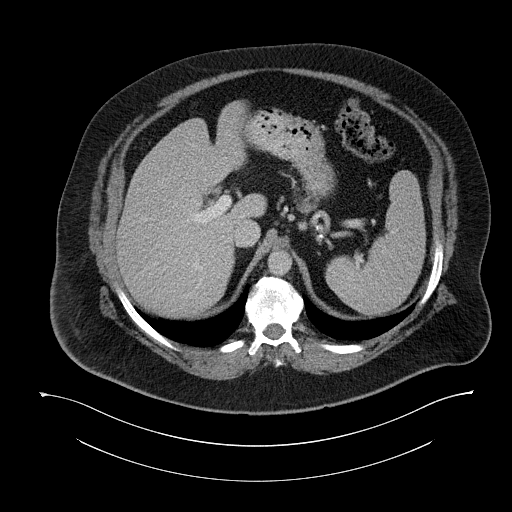
[im 95/109  soft-tissue]
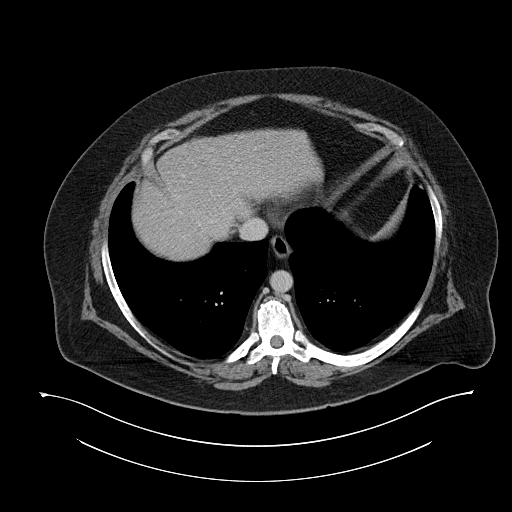
[im 102/109  soft-tissue]
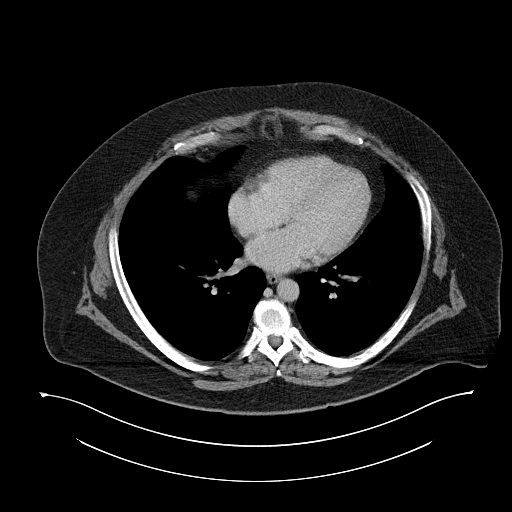

[Series 4: coronal soft tissue · coronal · 1.06mm/px · 3 of 122 slices shown]
[im 41/122  soft-tissue]
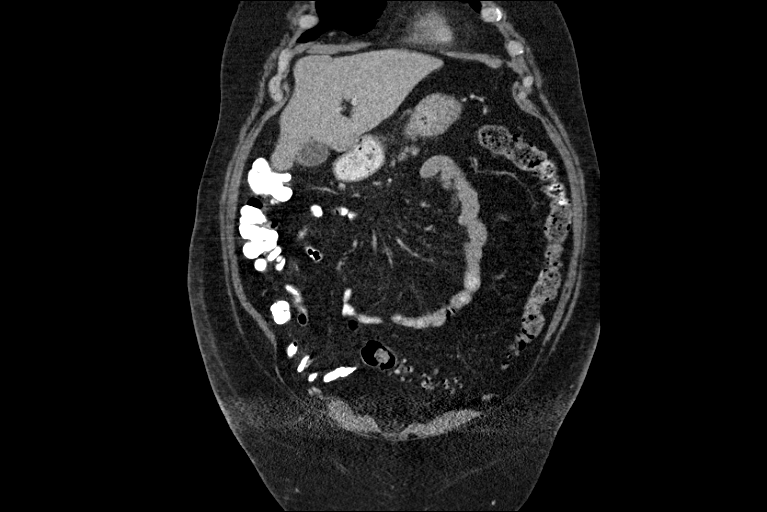
[im 54/122  soft-tissue]
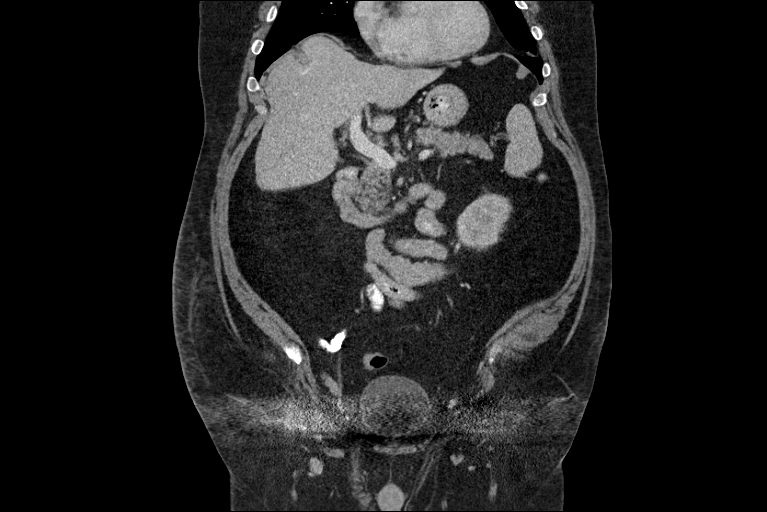
[im 68/122  soft-tissue]
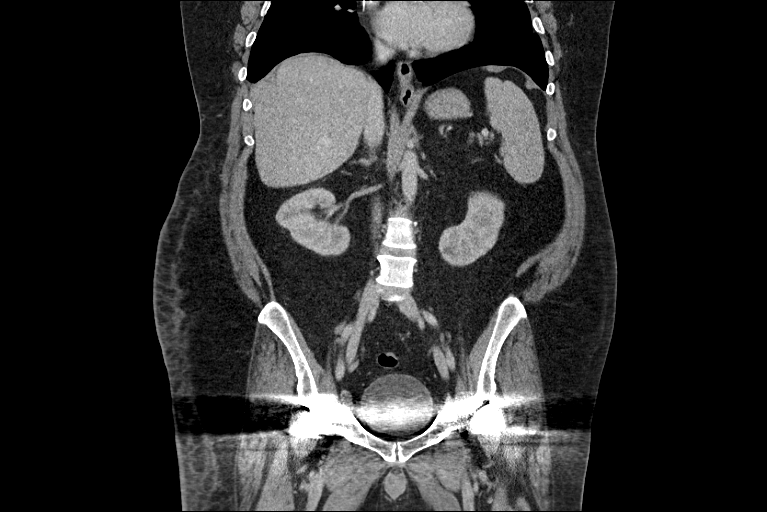

[15 of 46 positions shown; findings below may reference images not displayed]

FINDINGS: The lung bases are clear. There is no pleural or pericardial
effusion. The heart size appears normal.

No focal liver abnormality identified. The gallbladder appears
normal. No biliary dilatation. Normal appearance of the pancreas.
The spleen is normal.

The adrenal glands are both normal. The kidneys are both on
unremarkable. Small renal cysts are identified. The urinary bladder
is on unremarkable. Beam hardening artifact from bilateral hip
arthroplasty devices significantly diminishes detail within the
pelvis including the bladder, prostate gland and rectum.

Normal caliber of the abdominal aorta. No aneurysm. There is no
upper abdominal adenopathy identified. Right common iliac lymph node
measures 8 mm. Right external iliac node is borderline enlarged
measuring 10 mm. More distally there is an external iliac node
measuring 1.8 cm.

The stomach is normal. The small bowel loops have a normal course
and caliber without evidence for obstruction. Normal appearance of
the proximal colon. Multiple distal colonic diverticula are
identified without acute inflammation. Review of the visualized
osseous structures is significant for mild multi level lumbar
degenerative disc disease. Facet hypertrophy and degenerative
changes also noted within the lower lumbar spine. There is skin
thickening and subcutaneous fat stranding involving the ventral
abdominal wall and skin overlying the right hip region.
IMPRESSION: 1. No acute findings within the abdomen or pelvis.
2. Borderline enlarged right iliac and right inguinal lymph nodes.
3. Body wall cellulitis.

## 2015-12-12 NOTE — Discharge Summary (Signed)
Physician Discharge Summary  Patient ID: [name] MRN: [id number] DOB/AGE: 55-Dec-1962 55 y.o.  Admit date: 12/03/2015 Discharge date: 12/09/2015  Admission Diagnoses:    Discharge Diagnoses:  Active Problems:   Spigelian hernia with bowel obstruction   PROCEDURES: Repair incarcerated Spigelian hernia, 12/04/15, Dr. Gloris Ham Course:  Eros developed right lower quadrant pain on Thursday. It has persisted. He developed vomiting yesterday. He has not eaten anything since Friday afternoon. He came to the emergency department for further evaluation. He has had pains in this area before on and off for the past several months but has not sought medical attention for it. Evaluation here demonstrates leukocytosis of 18,200. 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 It contains the distal ileum and appendix. I was asked to see him for admission and treatment.   He was admitted and taken to the OR for surgery later that day.  Post op NG was obtained for obstruction, to allow for bowel rest and decompression.   He developed a post op ileus and was slow to open up.  His diet was slowly advanced after he had a BM.  Despite this he remained slow to open up and we actually decreased his PO's to allow for the ileus post op.  We were ready to let him go home on 12/09/15 and her reported very loose watery stools.  We recheck labs and C diff for colitis.  This was negative and labs below were normal.  He finally went home on  11/2415 with recommendations to remain on full liquids for another day or 2 before going up to a soft diet.  He will see [place] in the office as noted below and follow up with his primary care doctor in 2 weeks, or with any medical issues.  CBC Latest Ref Rng 12/09/2015 12/06/2015 12/05/2015  WBC 4.0 - 10.5 K/uL 9.7 9.2 9.9  Hemoglobin 13.0 - 17.0 g/dL 40.9 12.1(L) 11.4(L)  Hematocrit  39.0 - 52.0 % 42.7 38.9(L) 37.0(L)  Platelets 150 - 400 K/uL 332 272 224   CMP Latest Ref Rng 12/09/2015 12/06/2015 12/05/2015  Glucose 65 - 99 mg/dL 94 811(B) 147(W)  BUN 6 - 20 mg/dL 11 8 9   Creatinine 0.61 - 1.24 mg/dL 2.95 6.21 3.08  Sodium 135 - 145 mmol/L 136 137 137  Potassium 3.5 - 5.1 mmol/L 4.2 4.0 4.2  Chloride 101 - 111 mmol/L 99(L) 101 104  CO2 22 - 32 mmol/L 26 26 28   Calcium 8.9 - 10.3 mg/dL 8.9 6.5(H) 8.4(O)  Total Protein 6.5 - 8.1 g/dL - - -  Total Bilirubin 0.3 - 1.2 mg/dL - - -  Alkaline Phos 38 - 126 U/L - - -  AST 15 - 41 U/L - - -  ALT 17 - 63 U/L - - -   Condition on D/C:  Improved      Disposition: 01-Home or Self Care     Medication List    STOP taking these medications        oxyCODONE 5 MG immediate release tablet  Commonly known as:  Oxy IR/ROXICODONE     Oxycodone HCl 10 MG Tabs      TAKE these medications        acetaminophen 325 MG tablet  Commonly known as:  TYLENOL  Take 2 tablets (650 mg total) by mouth every 6 (six) hours as needed for mild pain,  moderate pain, fever or headache.     clindamycin 300 MG capsule  Commonly known as:  CLEOCIN  Take 300 mg by mouth See admin instructions. Take 1 capsule (300 mg) by mouth one hour before dental appointment and 1 capsule (300 mg) an hour after.     diltiazem 240 MG 24 hr capsule  Commonly known as:  CARDIZEM CD  Take 240 mg by mouth daily.     Fluticasone-Salmeterol 250-50 MCG/DOSE Aepb  Commonly known as:  ADVAIR  Inhale 1 puff into the lungs 2 (two) times daily as needed (wheezing).     folic acid 1 MG tablet  Commonly known as:  FOLVITE  Take 1 mg by mouth daily.     furosemide 40 MG tablet  Commonly known as:  LASIX  Take 40 mg by mouth daily.     HUMIRA 40 MG/0.8ML Pskt  Generic drug:  Adalimumab  Inject 40 mg into the skin every 14 (fourteen) days.     losartan 100 MG tablet  Commonly known as:  COZAAR  Take 100 mg by mouth daily.     methocarbamol 500 MG tablet   Commonly known as:  ROBAXIN  Take 1 tablet (500 mg total) by mouth every 6 (six) hours as needed for muscle spasms.     methotrexate 2.5 MG tablet  Commonly known as:  RHEUMATREX  Take 15 mg by mouth every Wednesday.     metoprolol succinate 50 MG 24 hr tablet  Commonly known as:  TOPROL-XL  Take 50 mg by mouth daily.     ondansetron 4 MG disintegrating tablet  Commonly known as:  ZOFRAN-ODT  Take 1 tablet (4 mg total) by mouth every 6 (six) hours as needed for nausea.     oxyCODONE-acetaminophen 5-325 MG tablet  Commonly known as:  PERCOCET/ROXICET  Take 1-2 tablets by mouth every 4 (four) hours as needed for moderate pain.     predniSONE 5 MG tablet  Commonly known as:  DELTASONE  Take 10 mg by mouth See admin instructions. Take 1 tablet (5 mg) by mouth daily for 3 days as needed for pain from rheumatoid arthritis     RABEprazole 20 MG tablet  Commonly known as:  ACIPHEX  Take 20 mg by mouth daily.     traMADol 50 MG tablet  Commonly known as:  ULTRAM  Take 1-2 tablets (50-100 mg total) by mouth every 6 (six) hours as needed (mild pain).           Follow-up Information    Follow up with [facility] On 12/16/2015.   Specialty:  General Surgery   Why:  Your appointment for staple removal with the nurse is at 2 PM, be at the office 30 minutes early for check in.   Contact information:   [address] [place] [address] [phone]       Follow up with Liz Malady, MD On 12/28/2015.   Specialty:  General Surgery   Why:  Your appointment is at 11:50 AM, be at the office for check in 30 minutes early.   Contact information:   [address] [place] [address] [phone]       Follow up with Garlan Fillers, MD.   Specialty:  Internal Medicine   Why:  Call for follow up appointment and treatment of medical issues.     Contact information:   [address] [place] [address] [phone]  SignedSherrie George 12/12/2015, 11:09 AM

## 2016-04-17 ENCOUNTER — Emergency Department (HOSPITAL_BASED_OUTPATIENT_CLINIC_OR_DEPARTMENT_OTHER)
Admit: 2016-04-17 | Discharge: 2016-04-17 | Disposition: A | Discharge status: Home / Self Care | Payer: 59 | Attending: Emergency Medicine | Admitting: Emergency Medicine

## 2016-04-17 ENCOUNTER — Emergency Department (HOSPITAL_COMMUNITY): Payer: 59

## 2016-04-17 ENCOUNTER — Emergency Department (HOSPITAL_COMMUNITY)
Admission: EM | Admit: 2016-04-17 | Discharge: 2016-04-17 | Disposition: A | Discharge status: Home / Self Care | Payer: 59 | Attending: Emergency Medicine | Admitting: Emergency Medicine

## 2016-04-17 ENCOUNTER — Encounter (HOSPITAL_COMMUNITY): Payer: Self-pay

## 2016-04-17 DIAGNOSIS — M25562 Pain in left knee: Secondary | ICD-10-CM | POA: Insufficient documentation

## 2016-04-17 DIAGNOSIS — Z96643 Presence of artificial hip joint, bilateral: Secondary | ICD-10-CM | POA: Diagnosis not present

## 2016-04-17 DIAGNOSIS — M79609 Pain in unspecified limb: Secondary | ICD-10-CM | POA: Diagnosis not present

## 2016-04-17 DIAGNOSIS — M25462 Effusion, left knee: Secondary | ICD-10-CM | POA: Insufficient documentation

## 2016-04-17 DIAGNOSIS — J45909 Unspecified asthma, uncomplicated: Secondary | ICD-10-CM | POA: Diagnosis not present

## 2016-04-17 DIAGNOSIS — M7989 Other specified soft tissue disorders: Secondary | ICD-10-CM | POA: Diagnosis not present

## 2016-04-17 DIAGNOSIS — L03115 Cellulitis of right lower limb: Secondary | ICD-10-CM

## 2016-04-17 DIAGNOSIS — Z87891 Personal history of nicotine dependence: Secondary | ICD-10-CM | POA: Diagnosis not present

## 2016-04-17 DIAGNOSIS — Z79899 Other long term (current) drug therapy: Secondary | ICD-10-CM | POA: Diagnosis not present

## 2016-04-17 DIAGNOSIS — Z96653 Presence of artificial knee joint, bilateral: Secondary | ICD-10-CM | POA: Insufficient documentation

## 2016-04-17 DIAGNOSIS — M069 Rheumatoid arthritis, unspecified: Secondary | ICD-10-CM | POA: Diagnosis not present

## 2016-04-17 DIAGNOSIS — L03116 Cellulitis of left lower limb: Secondary | ICD-10-CM | POA: Diagnosis not present

## 2016-04-17 DIAGNOSIS — M25561 Pain in right knee: Secondary | ICD-10-CM

## 2016-04-17 DIAGNOSIS — M25461 Effusion, right knee: Secondary | ICD-10-CM

## 2016-04-17 LAB — SEDIMENTATION RATE: Sed Rate: 53 mm/hr — ABNORMAL HIGH (ref 0–16)

## 2016-04-17 LAB — CBC WITH DIFFERENTIAL/PLATELET
BASOS ABS: 0 10*3/uL (ref 0.0–0.1)
Basophils Relative: 0 %
Eosinophils Absolute: 0.1 10*3/uL (ref 0.0–0.7)
Eosinophils Relative: 0 %
HEMATOCRIT: 37.9 % — AB (ref 39.0–52.0)
Hemoglobin: 12.5 g/dL — ABNORMAL LOW (ref 13.0–17.0)
LYMPHS PCT: 15 %
Lymphs Abs: 1.8 10*3/uL (ref 0.7–4.0)
MCH: 29.2 pg (ref 26.0–34.0)
MCHC: 33 g/dL (ref 30.0–36.0)
MCV: 88.6 fL (ref 78.0–100.0)
MONO ABS: 1.6 10*3/uL — AB (ref 0.1–1.0)
Monocytes Relative: 14 %
NEUTROS ABS: 8.1 10*3/uL — AB (ref 1.7–7.7)
Neutrophils Relative %: 71 %
Platelets: 207 10*3/uL (ref 150–400)
RBC: 4.28 MIL/uL (ref 4.22–5.81)
RDW: 15.4 % (ref 11.5–15.5)
WBC: 11.5 10*3/uL — ABNORMAL HIGH (ref 4.0–10.5)

## 2016-04-17 LAB — COMPREHENSIVE METABOLIC PANEL
ALK PHOS: 53 U/L (ref 38–126)
ALT: 15 U/L — ABNORMAL LOW (ref 17–63)
ANION GAP: 6 (ref 5–15)
AST: 17 U/L (ref 15–41)
Albumin: 3.7 g/dL (ref 3.5–5.0)
BILIRUBIN TOTAL: 1.2 mg/dL (ref 0.3–1.2)
BUN: 14 mg/dL (ref 6–20)
CALCIUM: 8.7 mg/dL — AB (ref 8.9–10.3)
CO2: 26 mmol/L (ref 22–32)
Chloride: 105 mmol/L (ref 101–111)
Creatinine, Ser: 0.96 mg/dL (ref 0.61–1.24)
GLUCOSE: 102 mg/dL — AB (ref 65–99)
POTASSIUM: 4.2 mmol/L (ref 3.5–5.1)
Sodium: 137 mmol/L (ref 135–145)
TOTAL PROTEIN: 7.6 g/dL (ref 6.5–8.1)

## 2016-04-17 LAB — SYNOVIAL CELL COUNT + DIFF, W/ CRYSTALS
CRYSTALS FLUID: UNDETERMINED
Eosinophils-Synovial: 0 % (ref 0–1)
Lymphocytes-Synovial Fld: 0 % (ref 0–20)
Monocyte-Macrophage-Synovial Fluid: 0 % — ABNORMAL LOW (ref 50–90)
Neutrophil, Synovial: 100 % — ABNORMAL HIGH (ref 0–25)
WBC, Synovial: 14200 /mm3 — ABNORMAL HIGH (ref 0–200)

## 2016-04-17 LAB — C-REACTIVE PROTEIN: CRP: 21.4 mg/dL — ABNORMAL HIGH (ref <1.0)

## 2016-04-17 MED ORDER — LIDOCAINE-EPINEPHRINE 2 %-1:100000 IJ SOLN
10.0000 mL | Freq: Once | INTRAMUSCULAR | Status: AC
  Administered 2016-04-17: 10 mL via INTRADERMAL
  Filled 2016-04-17: qty 1

## 2016-04-17 MED ORDER — LIDOCAINE HCL (PF) 1 % IJ SOLN
INTRAMUSCULAR | Status: AC
  Filled 2016-04-17: qty 30

## 2016-04-17 MED ORDER — LIDOCAINE HCL 2 % IJ SOLN
INTRAMUSCULAR | Status: AC
  Filled 2016-04-17: qty 20

## 2016-04-17 NOTE — Progress Notes (Signed)
VASCULAR LAB PRELIMINARY  PRELIMINARY  PRELIMINARY  PRELIMINARY  Right lower extremity venous duplex completed.    Preliminary report:  There is no DVT or SVT noted in the right lower extremity.   Nikos Anglemyer, RVT 04/17/2016, 4:00 PM

## 2016-04-17 NOTE — ED Notes (Signed)
Pt here with swelling to rt knee and leg.  Swelling started Sunday morning.  Pt on antibiotics for cellulitis.  That is improving per patient.  Pt has had hx of needing fluid drained from him knee.

## 2016-04-17 NOTE — Consult Note (Signed)
Reason for Consult:rightknee pain and swelling after total knee replacment Referring Physician: EDP  [name] is an 55 y.o. male.  HPI: 55 yo male patient of Dr [name] who presents with a several day history of increasing knee pain and swelling following moving the lawn last Saturday on a riding mower.  The patient noted redness on his calf and went to his primary who placed him on po Clindamycin for suspected cellulitis.  He noted redness on the inner thigh yesterday and called the [place] [place] on-call MD who recommended continuing the current po abx course and follow up here at [facility] today if not improved.  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  The patient has had one additional right knee surgery for infected bursa per report.  Past Medical History  Diagnosis Date  . Asthma   . GERD (gastroesophageal reflux disease)   . Hypertension   . Rheumatoid arthritis ([place]) dx'd ~ 1977  . Cellulitis of lower extremity     "usually RLL; this time it's got up to my lower abdoment" (02/11/2014)-series of antibiotics completed 02-28-14  . S/P PICC central line placement 03-16-14    right upper arm remains intact.05-11-14 removed 1 week ago.  . OSA (obstructive sleep apnea)     "quit wearing mask several years ago" (02/11/2014)  . Diverticulosis     Past Surgical History  Procedure Laterality Date  . 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 Excisional hemorrhoidectomy    . Revision total knee arthroplasty Right 2012  . Picc line placement Right     right upper arm  . Foreign body removal Left     "BB" removal above left eye-teen yrs.  . Excisional total knee arthroplasty with antibiotic spacers Right 03/18/2014    Procedure: RIGHT KNEE RESECTION ARTHROPLASTY WITH  ANTIBIOTIC SPACERS;  Surgeon: Vincent Alf, MD;  Location: [facility];  Service: Orthopedics;  Laterality: Right;  . Total knee arthroplasty Right 05/21/2014    Procedure: RIGHT KNEE ARTHROPLASTY REINPLANTATION;  Surgeon: Vincent Alf, MD;  Location: [facility];  Service: Orthopedics;  Laterality: Right;  . Incision and drainage Right 09/28/2014    Procedure: INCISION AND DRAINAGE RIGHT KNEE;  Surgeon: Vincent Alf, MD;  Location: [facility];  Service: Orthopedics;  Laterality: Right;  . Spigelian hernia Right 12/04/2015    Procedure: INCARCERATED SPIGELIAN HERNIA REPAIR WITH MESH;  Surgeon: Vincent Skeans, MD;  Location: [facility];  Service: General;  Laterality: Right;    Family History  Problem Relation Age of Onset  . Colon cancer Mother   . Hypertension Mother   . Diabetes Mellitus II Paternal Grandmother     Social History:  reports that he quit smoking about 35 years ago. His smoking use included Cigarettes. He has a 3 pack-year smoking history. His smokeless tobacco use includes Snuff. He reports that he does not drink alcohol or use illicit drugs.  Allergies:  Allergies  Allergen Reactions  . Avelox [Moxifloxacin Hcl In Nacl] Hives, Shortness Of Breath and Itching  . Rocephin [Ceftriaxone Sodium In Dextrose] Hives and Itching  . Sulfa Antibiotics Itching  . Vancomycin Hives and Itching  . Erythrocin Rash  . Penicillins Rash    Has patient had a PCN reaction causing immediate rash, facial/tongue/throat swelling, SOB  or lightheadedness with hypotension: Yes Has patient had a PCN reaction causing severe rash involving mucus membranes or skin necrosis: No Has patient had a PCN reaction that required hospitalization No Has patient had a PCN reaction occurring within the last 10 years: No If all of the above answers are "NO", then may proceed with Cephalosporin use.    Medications: I have reviewed the patient's current medications.  No results found for this or any previous visit (from  the past 48 hour(s)).  Dg Knee Complete 4 Views Right  04/17/2016  CLINICAL DATA:  Soft tissue swelling for 2 days EXAM: RIGHT KNEE - COMPLETE 4+ VIEW COMPARISON:  None. FINDINGS: Frontal, lateral, and bilateral oblique views were obtained. Patient is status post total knee replacement with the prosthetic components well-seated. No acute fracture or dislocation is evident. There is bony remodeling along the posterior proximal tibia. There is a moderate joint effusion. No erosive change is evident. IMPRESSION: Prosthetic components appear well seated. No acute fracture or dislocation. Moderate joint effusion present. Bony remodeling or posterior proximal tibia. Electronically Signed   By: [name] M.D.   On: 04/17/2016 14:24    ROS Blood pressure 134/59, pulse 79, temperature 98.2 F (36.8 C), temperature source Oral, resp. rate 18, SpO2 99 %. Physical Exam  Healthy appearing male in no apparent distress.  Right knee with well healed midline incision and no erythema or drainage at the incision site.  There is a palm sized area of erythema on the medial thigh proximal to the knee joint.  There is another slightly smaller area of erythema on the distal anterior chin.  There is no pain with AROM of the knee hip or ankle.  Neg Homan's and no cords appreciated. Distally his pulses and sensation are normal.  Assessment/Plan: Swelling and redness in the right leg with a right knee effusion after exchange arthoplasty.  Doppler was negative for a DVT in the right leg.  Labs including CBC/Diff, ESR and CRP are pending.  I discussed with the patient and his wife that I would recommend an aspiration of his right knee to obtain fluid for culture and micro analysis.  They agreed to this plan.  Under sterile conditions I aspirated 80 cc of yellowish cloudy fluid from the knee consistent with an inflammatory effusion.  This fluid was sent for gram stain, aerobic and anaerobic culture and cell count and  differential.  Patient will follow up with Dr [name] this week.  Until follow -up rest ice and elevation and continue po clindamycin.  [name],STEVEN R 04/17/2016, 4:01 PM

## 2016-04-17 NOTE — Discharge Instructions (Signed)

## 2016-04-17 NOTE — ED Provider Notes (Signed)
CSN: [id number]     Arrival date & time 04/17/16  1252 History   First MD Initiated Contact with Patient 04/17/16 1305     Chief Complaint  Patient presents with  . Leg Swelling     (Consider location/radiation/quality/duration/timing/severity/associated sxs/prior Treatment) HPI   Cellulitis Sunday AM with chills, redness of right leg. Clindamycin is helping, started taking it Sunday. (had leftover then called doctor yesterday who refilled it)  Redness was entire leg, now is only present for the post part in inside of upper leg.   Temp 98.3 at home.   Knee pain began Monday morning.  Noticed fluid gathering on knee. Just knee pain.  Severe pain when walking, 1-2 with rest.  Feels tight from fluid, but not severe pain with flexion.  Not sure if it feels the same as infection before but does feel like pain from when knee has effusions.     Past Medical History  Diagnosis Date  . Asthma   . GERD (gastroesophageal reflux disease)   . Hypertension   . Rheumatoid arthritis ([place]) dx'd ~ 1977  . Cellulitis of lower extremity     "usually RLL; this time it's got up to my lower abdoment" (02/11/2014)-series of antibiotics completed 02-28-14  . S/P PICC central line placement 03-16-14    right upper arm remains intact.05-11-14 removed 1 week ago.  . OSA (obstructive sleep apnea)     "quit wearing mask several years ago" (02/11/2014)  . Diverticulosis    Past Surgical History  Procedure Laterality Date  . 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 Excisional hemorrhoidectomy    . Revision total knee arthroplasty Right 2012  . Picc line placement Right     right upper arm  . Foreign body removal Left     "BB" removal above left eye-teen yrs.  . Excisional total knee arthroplasty with antibiotic spacers Right 03/18/2014    Procedure: RIGHT KNEE RESECTION ARTHROPLASTY  WITH ANTIBIOTIC SPACERS;  Surgeon: [name], MD;  [facility];  Service: Orthopedics;  Laterality: Right;  . Total knee arthroplasty Right 05/21/2014    Procedure: RIGHT KNEE ARTHROPLASTY REINPLANTATION;  Surgeon: [name], MD;  [facility];  Service: Orthopedics;  Laterality: Right;  . Incision and drainage Right 09/28/2014    Procedure: INCISION AND DRAINAGE RIGHT KNEE;  Surgeon: [name], MD;  [facility];  Service: Orthopedics;  Laterality: Right;  . Spigelian hernia Right 12/04/2015    Procedure: INCARCERATED SPIGELIAN HERNIA REPAIR WITH MESH;  Surgeon: Georganna Skeans, MD;  Location: [facility];  Service: General;  Laterality: Right;   Family History  Problem Relation Age of Onset  . Colon cancer Mother   . Hypertension Mother   . Diabetes Mellitus II Paternal Grandmother    Social History  Substance Use Topics  . Smoking status: Former Smoker -- 1.00 packs/day for 3 years    Types: Cigarettes    Quit date: 03/16/1981  . Smokeless tobacco: Current User    Types: Snuff     Comment: "quit smoking cigarettes in the 1980's"  . Alcohol Use: No    Review of Systems  Constitutional: Negative for fever.  HENT: Negative for sore throat.   Eyes: Negative for visual disturbance.  Respiratory: Negative for shortness of breath.   Cardiovascular: Negative for chest pain.  Gastrointestinal: Negative for nausea,  vomiting and abdominal pain.  Genitourinary: Negative for difficulty urinating.  Musculoskeletal: Positive for arthralgias. Negative for back pain and neck stiffness.  Skin: Positive for rash.  Neurological: Negative for syncope and headaches.      Allergies  Avelox; Rocephin; Sulfa antibiotics; Vancomycin; Erythrocin; and Penicillins  Home Medications   Prior to Admission medications   Medication Sig Start Date End Date Taking? Authorizing Provider  acetaminophen (TYLENOL) 325 MG tablet Take 2 tablets (650 mg total) by mouth every 6 (six) hours  as needed for mild pain, moderate pain, fever or headache. 12/09/15  Yes Earnstine Regal, PA-C  clindamycin (CLEOCIN) 300 MG capsule Take 600 mg by mouth 3 (three) times daily.  10/12/15  Yes Historical Provider, MD  diltiazem (CARDIZEM CD) 240 MG 24 hr capsule Take 240 mg by mouth daily.  11/12/10  Yes Historical Provider, MD  folic acid (FOLVITE) 1 MG tablet Take 1 mg by mouth daily.   Yes Historical Provider, MD  furosemide (LASIX) 40 MG tablet Take 40 mg by mouth daily.    Yes Historical Provider, MD  losartan (COZAAR) 100 MG tablet Take 100 mg by mouth daily.  11/12/10  Yes Historical Provider, MD  metoprolol (TOPROL-XL) 50 MG 24 hr tablet Take 50 mg by mouth daily.  11/12/10  Yes Historical Provider, MD  oxyCODONE-acetaminophen (PERCOCET/ROXICET) 5-325 MG tablet Take 1-2 tablets by mouth every 4 (four) hours as needed for moderate pain. 12/09/15  Yes Earnstine Regal, PA-C  Probiotic Product (PROBIOTIC PO) Take 1 tablet by mouth daily.   Yes Historical Provider, MD  RABEprazole (ACIPHEX) 20 MG tablet Take 20 mg by mouth daily.   Yes Historical Provider, MD  Adalimumab (HUMIRA) 40 MG/0.8ML PSKT Inject 40 mg into the skin every 14 (fourteen) days.    Historical Provider, MD  Fluticasone-Salmeterol (ADVAIR) 250-50 MCG/DOSE AEPB Inhale 1 puff into the lungs 2 (two) times daily as needed (wheezing).    Historical Provider, MD  methocarbamol (ROBAXIN) 500 MG tablet Take 1 tablet (500 mg total) by mouth every 6 (six) hours as needed for muscle spasms. Patient not taking: Reported on 12/03/2015 09/30/14   [name], PA-C  methotrexate (RHEUMATREX) 2.5 MG tablet Take 15 mg by mouth every Wednesday. 10/12/15   Historical Provider, MD  ondansetron (ZOFRAN-ODT) 4 MG disintegrating tablet Take 1 tablet (4 mg total) by mouth every 6 (six) hours as needed for nausea. 12/09/15   Earnstine Regal, PA-C  traMADol (ULTRAM) 50 MG tablet Take 1-2 tablets (50-100 mg total) by mouth every 6 (six) hours as needed (mild  pain). Patient not taking: Reported on 12/03/2015 09/30/14   [name], PA-C   BP 134/59 mmHg  Pulse 79  Temp(Src) 98.2 F (36.8 C) (Oral)  Resp 18  SpO2 99% Physical Exam  Constitutional: He is oriented to person, place, and time. He appears well-developed and well-nourished. No distress.  HENT:  Head: Normocephalic and atraumatic.  Eyes: Conjunctivae and EOM are normal.  Neck: Normal range of motion.  Cardiovascular: Normal rate, regular rhythm and intact distal pulses.   Pulmonary/Chest: Effort normal. No respiratory distress.  Abdominal: Soft. He exhibits no distension. There is no tenderness. There is no guarding.  Musculoskeletal: He exhibits no edema.       Right knee: He exhibits swelling and effusion. He exhibits normal range of motion (reports feels tight wiht passive and active ROM but no significant pain), no ecchymosis and no deformity.  Swelling of right lower extremity  Neurological: He is alert and oriented  to person, place, and time.  Skin: Skin is warm and dry. He is not diaphoretic. There is erythema (right medial thigh 20cm area of erythema, mild erythema of lower leg).  Nursing note and vitals reviewed.   ED Course  Procedures (including critical care time) Labs Review Labs Reviewed  CBC WITH DIFFERENTIAL/PLATELET - Abnormal; Notable for the following:    WBC 11.5 (*)    Hemoglobin 12.5 (*)    HCT 37.9 (*)    Neutro Abs 8.1 (*)    Monocytes Absolute 1.6 (*)    All other components within normal limits  COMPREHENSIVE METABOLIC PANEL - Abnormal; Notable for the following:    Glucose, Bld 102 (*)    Calcium 8.7 (*)    ALT 15 (*)    All other components within normal limits  SYNOVIAL CELL COUNT + DIFF, W/ CRYSTALS - Abnormal; Notable for the following:    Appearance-Synovial CLOUDY (*)    WBC, Synovial 14200 (*)    Neutrophil, Synovial 100 (*)    Monocyte-Macrophage-Synovial Fluid 0 (*)    All other components within normal limits  BODY FLUID CULTURE   SEDIMENTATION RATE  C-REACTIVE PROTEIN    Imaging Review Dg Knee Complete 4 Views Right  04/17/2016  CLINICAL DATA:  Soft tissue swelling for 2 days EXAM: RIGHT KNEE - COMPLETE 4+ VIEW COMPARISON:  None. FINDINGS: Frontal, lateral, and bilateral oblique views were obtained. Patient is status post total knee replacement with the prosthetic components well-seated. No acute fracture or dislocation is evident. There is bony remodeling along the posterior proximal tibia. There is a moderate joint effusion. No erosive change is evident. IMPRESSION: Prosthetic components appear well seated. No acute fracture or dislocation. Moderate joint effusion present. Bony remodeling or posterior proximal tibia. Electronically Signed   By: Lowella Grip III M.D.   On: 04/17/2016 14:24   I have personally reviewed and evaluated these images and lab results as part of my medical decision-making.   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  Patient reports long history of recurrent sialitis of the right X remedy, and cellulitis treatment initiated on Sunday with clindamycin has significantly improved the infection. Given this, I feel continued outpatient treatment for cellulitis is appropriate with clindamycin, with close PCP follow-up. Patient is afebrile, well-appearing, with normal vital signs.  Patient has swelling of the right lower extremity, and DVT study was ordered to evaluate for DVT, and showed no acute abnormalities. Swelling is likely secondary to the  right lower external remedy cellulitis.  Given patient's history of septic arthritis, discussed with Dr. Veverly Fells of [facility] who came to bedside and performed synovial fluid aspiration.  Similar synovial fluid cell count is 14,200, and overall have low suspicion for septic arthritis clinically and feel effusion is likely secondary to RA. Patient will follow up with Dr. Maureen Ralphs in the next few days, and culture will be followed.  Patient discharged in stable condition with understanding of reasons to return.    Gareth Morgan, MD 04/17/16 1719

## 2016-04-19 ENCOUNTER — Telehealth: Payer: Self-pay | Admitting: *Deleted

## 2016-04-20 NOTE — ED Notes (Signed)
(+)  synovial fluid culture discussed with Dr. Judd Lien who feel result is likely due to contaminant but recommends review with ID.  Contacted Dr. Orvan Falconer with ID who feels follow up with orthopedist is best course.  Spoke with patient who states he is being followed for same with Dr. Lequita Halt and saw him as scheduled on 04/19/2016.

## 2016-04-21 LAB — BODY FLUID CULTURE

## 2016-04-22 ENCOUNTER — Telehealth (HOSPITAL_BASED_OUTPATIENT_CLINIC_OR_DEPARTMENT_OTHER): Payer: Self-pay

## 2016-04-22 NOTE — Telephone Encounter (Signed)
Post ED Visit - Positive Culture Follow-up  Culture report reviewed by antimicrobial stewardship pharmacist:  []  , Pharm.D. []  Enzo Bi, Pharm.D., BCPS []  , Pharm.D. []  Celedonio Miyamoto, Pharm.D., BCPS []  [place], Garvin Fila.D., BCPS, AAHIVP []  , Pharm.D., BCPS, AAHIVP []  Georgina Pillion, Pharm.D. []  , [place].[id number] Pharm D Positive body fluid culture and no further patient follow-up is required at this time.  04/22/2016, 11:07 AM

## 2016-04-24 ENCOUNTER — Encounter (HOSPITAL_COMMUNITY): Payer: Self-pay | Admitting: *Deleted

## 2016-04-24 ENCOUNTER — Ambulatory Visit: Payer: Self-pay | Admitting: Orthopedic Surgery

## 2016-04-25 ENCOUNTER — Ambulatory Visit (HOSPITAL_COMMUNITY): Payer: 59 | Admitting: Anesthesiology

## 2016-04-25 ENCOUNTER — Encounter (HOSPITAL_COMMUNITY)
Admission: RE | Disposition: A | Discharge status: Home Health | Payer: Self-pay | Source: Ambulatory Visit | Attending: Orthopedic Surgery

## 2016-04-25 ENCOUNTER — Encounter (HOSPITAL_COMMUNITY): Payer: Self-pay | Admitting: *Deleted

## 2016-04-25 ENCOUNTER — Inpatient Hospital Stay (HOSPITAL_COMMUNITY)
Admission: RE | Admit: 2016-04-25 | Discharge: 2016-04-29 | DRG: 464 | Disposition: A | Discharge status: Home Health | Payer: 59 | Source: Ambulatory Visit | Attending: Orthopedic Surgery | Admitting: Orthopedic Surgery

## 2016-04-25 DIAGNOSIS — Z882 Allergy status to sulfonamides status: Secondary | ICD-10-CM | POA: Diagnosis not present

## 2016-04-25 DIAGNOSIS — M1711 Unilateral primary osteoarthritis, right knee: Secondary | ICD-10-CM | POA: Diagnosis present

## 2016-04-25 DIAGNOSIS — Z79899 Other long term (current) drug therapy: Secondary | ICD-10-CM | POA: Diagnosis not present

## 2016-04-25 DIAGNOSIS — M009 Pyogenic arthritis, unspecified: Secondary | ICD-10-CM | POA: Diagnosis present

## 2016-04-25 DIAGNOSIS — Z87891 Personal history of nicotine dependence: Secondary | ICD-10-CM | POA: Diagnosis not present

## 2016-04-25 DIAGNOSIS — Z96641 Presence of right artificial hip joint: Secondary | ICD-10-CM | POA: Diagnosis present

## 2016-04-25 DIAGNOSIS — Z6841 Body Mass Index (BMI) 40.0 and over, adult: Secondary | ICD-10-CM

## 2016-04-25 DIAGNOSIS — M069 Rheumatoid arthritis, unspecified: Secondary | ICD-10-CM | POA: Diagnosis present

## 2016-04-25 DIAGNOSIS — I10 Essential (primary) hypertension: Secondary | ICD-10-CM | POA: Diagnosis present

## 2016-04-25 DIAGNOSIS — M25561 Pain in right knee: Secondary | ICD-10-CM | POA: Diagnosis present

## 2016-04-25 DIAGNOSIS — J45909 Unspecified asthma, uncomplicated: Secondary | ICD-10-CM | POA: Diagnosis present

## 2016-04-25 DIAGNOSIS — Z833 Family history of diabetes mellitus: Secondary | ICD-10-CM

## 2016-04-25 DIAGNOSIS — Y831 Surgical operation with implant of artificial internal device as the cause of abnormal reaction of the patient, or of later complication, without mention of misadventure at the time of the procedure: Secondary | ICD-10-CM | POA: Diagnosis present

## 2016-04-25 DIAGNOSIS — Z96653 Presence of artificial knee joint, bilateral: Secondary | ICD-10-CM | POA: Diagnosis present

## 2016-04-25 DIAGNOSIS — Z881 Allergy status to other antibiotic agents status: Secondary | ICD-10-CM | POA: Diagnosis not present

## 2016-04-25 DIAGNOSIS — Z880 Allergy status to penicillin: Secondary | ICD-10-CM

## 2016-04-25 DIAGNOSIS — Z800 Family history of malignant neoplasm of digestive organs: Secondary | ICD-10-CM | POA: Diagnosis not present

## 2016-04-25 DIAGNOSIS — M064 Inflammatory polyarthropathy: Secondary | ICD-10-CM | POA: Diagnosis present

## 2016-04-25 DIAGNOSIS — G4733 Obstructive sleep apnea (adult) (pediatric): Secondary | ICD-10-CM | POA: Diagnosis present

## 2016-04-25 DIAGNOSIS — K219 Gastro-esophageal reflux disease without esophagitis: Secondary | ICD-10-CM | POA: Diagnosis present

## 2016-04-25 DIAGNOSIS — Z8249 Family history of ischemic heart disease and other diseases of the circulatory system: Secondary | ICD-10-CM

## 2016-04-25 DIAGNOSIS — T8453XA Infection and inflammatory reaction due to internal right knee prosthesis, initial encounter: Secondary | ICD-10-CM | POA: Diagnosis present

## 2016-04-25 DIAGNOSIS — Y792 Prosthetic and other implants, materials and accessory orthopedic devices associated with adverse incidents: Secondary | ICD-10-CM | POA: Diagnosis not present

## 2016-04-25 DIAGNOSIS — B957 Other staphylococcus as the cause of diseases classified elsewhere: Secondary | ICD-10-CM | POA: Diagnosis not present

## 2016-04-25 DIAGNOSIS — M00061 Staphylococcal arthritis, right knee: Secondary | ICD-10-CM | POA: Diagnosis not present

## 2016-04-25 HISTORY — DX: Other specified postprocedural states: R11.2

## 2016-04-25 HISTORY — PX: I & D KNEE WITH POLY EXCHANGE: SHX5024

## 2016-04-25 HISTORY — DX: Other specified postprocedural states: Z98.890

## 2016-04-25 LAB — CBC
HEMATOCRIT: 37.5 % — AB (ref 39.0–52.0)
HEMOGLOBIN: 12 g/dL — AB (ref 13.0–17.0)
MCH: 28.5 pg (ref 26.0–34.0)
MCHC: 32 g/dL (ref 30.0–36.0)
MCV: 89.1 fL (ref 78.0–100.0)
Platelets: 449 10*3/uL — ABNORMAL HIGH (ref 150–400)
RBC: 4.21 MIL/uL — ABNORMAL LOW (ref 4.22–5.81)
RDW: 15.1 % (ref 11.5–15.5)
WBC: 10 10*3/uL (ref 4.0–10.5)

## 2016-04-25 LAB — PROTIME-INR
INR: 1.24 (ref 0.00–1.49)
Prothrombin Time: 15.2 seconds (ref 11.6–15.2)

## 2016-04-25 LAB — APTT: APTT: 30 s (ref 24–37)

## 2016-04-25 LAB — COMPREHENSIVE METABOLIC PANEL
ALBUMIN: 3.3 g/dL — AB (ref 3.5–5.0)
ALK PHOS: 58 U/L (ref 38–126)
ALT: 15 U/L — ABNORMAL LOW (ref 17–63)
ANION GAP: 7 (ref 5–15)
AST: 16 U/L (ref 15–41)
BILIRUBIN TOTAL: 0.6 mg/dL (ref 0.3–1.2)
BUN: 17 mg/dL (ref 6–20)
CALCIUM: 8.9 mg/dL (ref 8.9–10.3)
CO2: 25 mmol/L (ref 22–32)
CREATININE: 1.02 mg/dL (ref 0.61–1.24)
Chloride: 102 mmol/L (ref 101–111)
GFR calc Af Amer: >60 mL/min (ref >60)
GFR calc non Af Amer: >60 mL/min (ref >60)
GLUCOSE: 104 mg/dL — AB (ref 65–99)
Potassium: 5 mmol/L (ref 3.5–5.1)
Sodium: 134 mmol/L — ABNORMAL LOW (ref 135–145)
TOTAL PROTEIN: 7.8 g/dL (ref 6.5–8.1)

## 2016-04-25 LAB — TYPE AND SCREEN
ABO/RH(D): O POS
Antibody Screen: NEGATIVE

## 2016-04-25 SURGERY — IRRIGATION AND DEBRIDEMENT KNEE WITH POLY EXCHANGE
Anesthesia: General | Site: Knee | Laterality: Right

## 2016-04-25 MED ORDER — ACETAMINOPHEN 325 MG PO TABS: 650.0000 mg | ORAL_TABLET | Freq: Four times a day (QID) | ORAL | Status: DC | PRN

## 2016-04-25 MED ORDER — DILTIAZEM HCL ER COATED BEADS 240 MG PO CP24
240.0000 mg | ORAL_CAPSULE | Freq: Every day | ORAL | Status: DC
  Administered 2016-04-26 – 2016-04-29 (×4): 240 mg via ORAL
  Filled 2016-04-25 (×4): qty 1

## 2016-04-25 MED ORDER — SODIUM CHLORIDE 0.9 % IR SOLN
Status: DC | PRN
  Administered 2016-04-25 (×2): 3000 mL

## 2016-04-25 MED ORDER — POLYETHYLENE GLYCOL 3350 17 G PO PACK: 17.0000 g | PACK | Freq: Every day | ORAL | Status: DC | PRN

## 2016-04-25 MED ORDER — METHOCARBAMOL 1000 MG/10ML IJ SOLN
500.0000 mg | Freq: Four times a day (QID) | INTRAVENOUS | Status: DC | PRN
  Administered 2016-04-25: 500 mg via INTRAVENOUS
  Filled 2016-04-25 (×2): qty 5

## 2016-04-25 MED ORDER — PHENYLEPHRINE HCL 10 MG/ML IJ SOLN
INTRAMUSCULAR | Status: DC | PRN
  Administered 2016-04-25 (×3): 80 ug via INTRAVENOUS
  Administered 2016-04-25: 40 ug via INTRAVENOUS

## 2016-04-25 MED ORDER — METOCLOPRAMIDE HCL 5 MG PO TABS: 5.0000 mg | ORAL_TABLET | Freq: Three times a day (TID) | ORAL | Status: DC | PRN

## 2016-04-25 MED ORDER — FENTANYL CITRATE (PF) 250 MCG/5ML IJ SOLN
INTRAMUSCULAR | Status: DC | PRN
  Administered 2016-04-25 (×2): 50 ug via INTRAVENOUS
  Administered 2016-04-25: 25 ug via INTRAVENOUS
  Administered 2016-04-25: 50 ug via INTRAVENOUS
  Administered 2016-04-25: 75 ug via INTRAVENOUS

## 2016-04-25 MED ORDER — MOMETASONE FURO-FORMOTEROL FUM 200-5 MCG/ACT IN AERO: 2.0000 | INHALATION_SPRAY | Freq: Two times a day (BID) | RESPIRATORY_TRACT | Status: DC | PRN

## 2016-04-25 MED ORDER — ONDANSETRON HCL 4 MG/2ML IJ SOLN
INTRAMUSCULAR | Status: DC | PRN
  Administered 2016-04-25: 4 mg via INTRAVENOUS

## 2016-04-25 MED ORDER — OXYCODONE HCL 5 MG PO TABS
5.0000 mg | ORAL_TABLET | ORAL | Status: DC | PRN
  Administered 2016-04-25 – 2016-04-26 (×2): 5 mg via ORAL
  Administered 2016-04-27: 10 mg via ORAL
  Filled 2016-04-25 (×2): qty 2
  Filled 2016-04-25: qty 1

## 2016-04-25 MED ORDER — LACTATED RINGERS IV SOLN
INTRAVENOUS | Status: DC
  Administered 2016-04-25 (×2): via INTRAVENOUS

## 2016-04-25 MED ORDER — ACETAMINOPHEN 10 MG/ML IV SOLN
1000.0000 mg | Freq: Once | INTRAVENOUS | Status: AC
Start: 2016-04-25 — End: 2016-04-25
  Administered 2016-04-25: 1000 mg via INTRAVENOUS

## 2016-04-25 MED ORDER — ONDANSETRON HCL 4 MG PO TABS: 4.0000 mg | ORAL_TABLET | Freq: Four times a day (QID) | ORAL | Status: DC | PRN

## 2016-04-25 MED ORDER — LOSARTAN POTASSIUM 50 MG PO TABS
100.0000 mg | ORAL_TABLET | Freq: Every day | ORAL | Status: DC
  Administered 2016-04-26 – 2016-04-29 (×4): 100 mg via ORAL
  Filled 2016-04-25 (×4): qty 2

## 2016-04-25 MED ORDER — CLINDAMYCIN PHOSPHATE 600 MG/50ML IV SOLN
600.0000 mg | Freq: Four times a day (QID) | INTRAVENOUS | Status: AC
  Administered 2016-04-25 – 2016-04-26 (×2): 600 mg via INTRAVENOUS
  Filled 2016-04-25 (×4): qty 50

## 2016-04-25 MED ORDER — MORPHINE SULFATE (PF) 2 MG/ML IV SOLN: 1.0000 mg | INTRAVENOUS | Status: DC | PRN

## 2016-04-25 MED ORDER — DEXAMETHASONE SODIUM PHOSPHATE 10 MG/ML IJ SOLN
10.0000 mg | Freq: Once | INTRAMUSCULAR | Status: AC
  Administered 2016-04-26: 10 mg via INTRAVENOUS
  Filled 2016-04-25: qty 1

## 2016-04-25 MED ORDER — LACTATED RINGERS IV SOLN: INTRAVENOUS | Status: DC

## 2016-04-25 MED ORDER — HYDROMORPHONE HCL 1 MG/ML IJ SOLN
INTRAMUSCULAR | Status: AC
  Filled 2016-04-25: qty 1

## 2016-04-25 MED ORDER — SUCCINYLCHOLINE CHLORIDE 20 MG/ML IJ SOLN
INTRAMUSCULAR | Status: DC | PRN
  Administered 2016-04-25: 100 mg via INTRAVENOUS

## 2016-04-25 MED ORDER — HYDROMORPHONE HCL 1 MG/ML IJ SOLN
0.2500 mg | INTRAMUSCULAR | Status: DC | PRN
  Administered 2016-04-25 (×2): 0.5 mg via INTRAVENOUS

## 2016-04-25 MED ORDER — BUPIVACAINE HCL (PF) 0.25 % IJ SOLN
INTRAMUSCULAR | Status: DC | PRN
  Administered 2016-04-25: 30 mL

## 2016-04-25 MED ORDER — PROPOFOL 10 MG/ML IV BOLUS
INTRAVENOUS | Status: DC | PRN
Start: 2016-04-25 — End: 2016-04-25
  Administered 2016-04-25: 160 mg via INTRAVENOUS

## 2016-04-25 MED ORDER — BUPIVACAINE HCL (PF) 0.25 % IJ SOLN
INTRAMUSCULAR | Status: AC
  Filled 2016-04-25: qty 30

## 2016-04-25 MED ORDER — FLEET ENEMA 7-19 GM/118ML RE ENEM: 1.0000 | ENEMA | Freq: Once | RECTAL | Status: DC | PRN

## 2016-04-25 MED ORDER — BISACODYL 10 MG RE SUPP: 10.0000 mg | Freq: Every day | RECTAL | Status: DC | PRN

## 2016-04-25 MED ORDER — PROPOFOL 10 MG/ML IV BOLUS
INTRAVENOUS | Status: AC
  Filled 2016-04-25: qty 20

## 2016-04-25 MED ORDER — FOLIC ACID 1 MG PO TABS
1.0000 mg | ORAL_TABLET | Freq: Every day | ORAL | Status: DC
  Administered 2016-04-26 – 2016-04-29 (×4): 1 mg via ORAL
  Filled 2016-04-25 (×4): qty 1

## 2016-04-25 MED ORDER — SODIUM CHLORIDE 0.9 % IJ SOLN
INTRAMUSCULAR | Status: DC | PRN
  Administered 2016-04-25: 30 mL

## 2016-04-25 MED ORDER — TRAMADOL HCL 50 MG PO TABS: 50.0000 mg | ORAL_TABLET | Freq: Four times a day (QID) | ORAL | Status: DC | PRN

## 2016-04-25 MED ORDER — METOCLOPRAMIDE HCL 5 MG/ML IJ SOLN: 5.0000 mg | Freq: Three times a day (TID) | INTRAMUSCULAR | Status: DC | PRN

## 2016-04-25 MED ORDER — DEXAMETHASONE SODIUM PHOSPHATE 10 MG/ML IJ SOLN
INTRAMUSCULAR | Status: AC
  Filled 2016-04-25: qty 1

## 2016-04-25 MED ORDER — FUROSEMIDE 40 MG PO TABS
40.0000 mg | ORAL_TABLET | Freq: Every day | ORAL | Status: DC
  Administered 2016-04-26 – 2016-04-29 (×4): 40 mg via ORAL
  Filled 2016-04-25 (×4): qty 1

## 2016-04-25 MED ORDER — FENTANYL CITRATE (PF) 250 MCG/5ML IJ SOLN
INTRAMUSCULAR | Status: AC
  Filled 2016-04-25: qty 5

## 2016-04-25 MED ORDER — BUPIVACAINE LIPOSOME 1.3 % IJ SUSP
INTRAMUSCULAR | Status: DC | PRN
  Administered 2016-04-25: 20 mL

## 2016-04-25 MED ORDER — PHENYLEPHRINE 40 MCG/ML (10ML) SYRINGE FOR IV PUSH (FOR BLOOD PRESSURE SUPPORT)
PREFILLED_SYRINGE | INTRAVENOUS | Status: AC
  Filled 2016-04-25: qty 20

## 2016-04-25 MED ORDER — METOPROLOL SUCCINATE ER 50 MG PO TB24
50.0000 mg | ORAL_TABLET | Freq: Every day | ORAL | Status: DC
  Administered 2016-04-26 – 2016-04-29 (×4): 50 mg via ORAL
  Filled 2016-04-25 (×5): qty 1

## 2016-04-25 MED ORDER — PHENOL 1.4 % MT LIQD: 1.0000 | OROMUCOSAL | Status: DC | PRN

## 2016-04-25 MED ORDER — CLINDAMYCIN PHOSPHATE 900 MG/50ML IV SOLN
INTRAVENOUS | Status: AC
  Filled 2016-04-25: qty 50

## 2016-04-25 MED ORDER — CLINDAMYCIN PHOSPHATE 900 MG/50ML IV SOLN
900.0000 mg | INTRAVENOUS | Status: AC
  Administered 2016-04-25: 900 mg via INTRAVENOUS

## 2016-04-25 MED ORDER — LIDOCAINE HCL (CARDIAC) 20 MG/ML IV SOLN
INTRAVENOUS | Status: DC | PRN
  Administered 2016-04-25: 40 mg via INTRAVENOUS

## 2016-04-25 MED ORDER — SODIUM CHLORIDE 0.9 % IJ SOLN
INTRAMUSCULAR | Status: AC
  Filled 2016-04-25: qty 10

## 2016-04-25 MED ORDER — ACETAMINOPHEN 650 MG RE SUPP: 650.0000 mg | Freq: Four times a day (QID) | RECTAL | Status: DC | PRN

## 2016-04-25 MED ORDER — METHOCARBAMOL 500 MG PO TABS
500.0000 mg | ORAL_TABLET | Freq: Four times a day (QID) | ORAL | Status: DC | PRN
  Administered 2016-04-27: 500 mg via ORAL
  Filled 2016-04-25 (×2): qty 1

## 2016-04-25 MED ORDER — DOCUSATE SODIUM 100 MG PO CAPS
100.0000 mg | ORAL_CAPSULE | Freq: Two times a day (BID) | ORAL | Status: DC
  Administered 2016-04-25 – 2016-04-29 (×8): 100 mg via ORAL
  Filled 2016-04-25 (×8): qty 1

## 2016-04-25 MED ORDER — PANTOPRAZOLE SODIUM 40 MG PO TBEC
40.0000 mg | DELAYED_RELEASE_TABLET | Freq: Every day | ORAL | Status: DC
  Administered 2016-04-26 – 2016-04-29 (×4): 40 mg via ORAL
  Filled 2016-04-25 (×4): qty 1

## 2016-04-25 MED ORDER — 0.9 % SODIUM CHLORIDE (POUR BTL) OPTIME
TOPICAL | Status: DC | PRN
  Administered 2016-04-25: 1000 mL

## 2016-04-25 MED ORDER — MENTHOL 3 MG MT LOZG: 1.0000 | LOZENGE | OROMUCOSAL | Status: DC | PRN

## 2016-04-25 MED ORDER — ACETAMINOPHEN 500 MG PO TABS
1000.0000 mg | ORAL_TABLET | Freq: Four times a day (QID) | ORAL | Status: AC
  Administered 2016-04-25 – 2016-04-26 (×4): 1000 mg via ORAL
  Filled 2016-04-25 (×4): qty 2

## 2016-04-25 MED ORDER — SODIUM CHLORIDE 0.9 % IV SOLN
INTRAVENOUS | Status: DC
  Administered 2016-04-25 – 2016-04-26 (×3): via INTRAVENOUS

## 2016-04-25 MED ORDER — ONDANSETRON HCL 4 MG/2ML IJ SOLN
INTRAMUSCULAR | Status: AC
  Filled 2016-04-25: qty 2

## 2016-04-25 MED ORDER — ACETAMINOPHEN 10 MG/ML IV SOLN
INTRAVENOUS | Status: AC
  Filled 2016-04-25: qty 100

## 2016-04-25 MED ORDER — ASPIRIN EC 325 MG PO TBEC
325.0000 mg | DELAYED_RELEASE_TABLET | Freq: Every day | ORAL | Status: DC
  Administered 2016-04-26 – 2016-04-29 (×4): 325 mg via ORAL
  Filled 2016-04-25 (×4): qty 1

## 2016-04-25 MED ORDER — DEXAMETHASONE SODIUM PHOSPHATE 10 MG/ML IJ SOLN
10.0000 mg | Freq: Once | INTRAMUSCULAR | Status: AC
  Administered 2016-04-25: 10 mg via INTRAVENOUS

## 2016-04-25 MED ORDER — ONDANSETRON HCL 4 MG/2ML IJ SOLN
4.0000 mg | Freq: Four times a day (QID) | INTRAMUSCULAR | Status: DC | PRN
Start: 2016-04-25 — End: 2016-04-29

## 2016-04-25 MED ORDER — DIPHENHYDRAMINE HCL 12.5 MG/5ML PO ELIX: 12.5000 mg | ORAL_SOLUTION | ORAL | Status: DC | PRN

## 2016-04-25 MED ORDER — GENTAMICIN SULFATE 40 MG/ML IJ SOLN
INTRAMUSCULAR | Status: DC | PRN
  Administered 2016-04-25: 160 mg

## 2016-04-25 MED ORDER — MIDAZOLAM HCL 2 MG/2ML IJ SOLN
INTRAMUSCULAR | Status: DC | PRN
  Administered 2016-04-25 (×2): 1 mg via INTRAVENOUS

## 2016-04-25 MED ORDER — CHLORHEXIDINE GLUCONATE 4 % EX LIQD: 60.0000 mL | Freq: Once | CUTANEOUS | Status: DC

## 2016-04-25 MED ORDER — MIDAZOLAM HCL 2 MG/2ML IJ SOLN
INTRAMUSCULAR | Status: AC
  Filled 2016-04-25: qty 2

## 2016-04-25 MED ORDER — LIDOCAINE HCL (CARDIAC) 20 MG/ML IV SOLN
INTRAVENOUS | Status: AC
  Filled 2016-04-25: qty 10

## 2016-04-25 MED ORDER — HYDROMORPHONE HCL 2 MG/ML IJ SOLN
INTRAMUSCULAR | Status: AC
  Filled 2016-04-25: qty 1

## 2016-04-25 MED ORDER — NALOXONE HCL 0.4 MG/ML IJ SOLN
INTRAMUSCULAR | Status: AC
  Filled 2016-04-25: qty 1

## 2016-04-25 MED ORDER — BUPIVACAINE LIPOSOME 1.3 % IJ SUSP
20.0000 mL | Freq: Once | INTRAMUSCULAR | Status: DC
  Filled 2016-04-25: qty 20

## 2016-04-25 MED ORDER — SODIUM CHLORIDE 0.9 % IJ SOLN
INTRAMUSCULAR | Status: AC
  Filled 2016-04-25: qty 50

## 2016-04-25 MED ORDER — HYDROMORPHONE HCL 1 MG/ML IJ SOLN
INTRAMUSCULAR | Status: DC | PRN
  Administered 2016-04-25 (×2): 0.5 mg via INTRAVENOUS

## 2016-04-25 SURGICAL SUPPLY — 50 items
BAG SPEC THK2 15X12 ZIP CLS (MISCELLANEOUS) ×1
BAG ZIPLOCK 12X15 (MISCELLANEOUS) ×3 IMPLANT
BANDAGE ACE 6X5 VEL STRL LF (GAUZE/BANDAGES/DRESSINGS) ×3 IMPLANT
CLOSURE WOUND 1/2 X4 (GAUZE/BANDAGES/DRESSINGS) ×2
CLOTH BEACON ORANGE TIMEOUT ST (SAFETY) ×3 IMPLANT
CUFF TOURN SGL QUICK 34 (TOURNIQUET CUFF) ×3
CUFF TRNQT CYL 34X4X40X1 (TOURNIQUET CUFF) ×1 IMPLANT
DRAPE U-SHAPE 47X51 STRL (DRAPES) ×3 IMPLANT
DRSG ADAPTIC 3X8 NADH LF (GAUZE/BANDAGES/DRESSINGS) ×3 IMPLANT
DRSG PAD ABDOMINAL 8X10 ST (GAUZE/BANDAGES/DRESSINGS) ×9 IMPLANT
DURAPREP 26ML APPLICATOR (WOUND CARE) ×3 IMPLANT
ELECT REM PT RETURN 9FT ADLT (ELECTROSURGICAL) ×3
ELECTRODE REM PT RTRN 9FT ADLT (ELECTROSURGICAL) ×1 IMPLANT
EVACUATOR 1/8 PVC DRAIN (DRAIN) ×3 IMPLANT
GAUZE SPONGE 4X4 12PLY STRL (GAUZE/BANDAGES/DRESSINGS) ×3 IMPLANT
GLOVE BIO SURGEON STRL SZ7.5 (GLOVE) ×3 IMPLANT
GLOVE BIO SURGEON STRL SZ8 (GLOVE) ×3 IMPLANT
GLOVE BIOGEL PI IND STRL 8 (GLOVE) ×1 IMPLANT
GLOVE BIOGEL PI INDICATOR 8 (GLOVE) ×2
GOWN STRL REUS W/TWL LRG LVL3 (GOWN DISPOSABLE) ×3 IMPLANT
GOWN STRL REUS W/TWL XL LVL3 (GOWN DISPOSABLE) ×3 IMPLANT
HANDPIECE INTERPULSE COAX TIP (DISPOSABLE) ×3
IMMOBILIZER KNEE 20 (SOFTGOODS) ×5 IMPLANT
IMMOBILIZER KNEE 20 THIGH 36 (SOFTGOODS) ×1 IMPLANT
INSERT TIBIAL TC3 RP SZ4.0 25 (Knees) ×2 IMPLANT
KIT STIMULAN RAPID CURE  10CC (Orthopedic Implant) ×2 IMPLANT
KIT STIMULAN RAPID CURE 10CC (Orthopedic Implant) IMPLANT
MANIFOLD NEPTUNE II (INSTRUMENTS) ×3 IMPLANT
NS IRRIG 1000ML POUR BTL (IV SOLUTION) ×3 IMPLANT
PACK TOTAL KNEE CUSTOM (KITS) ×3 IMPLANT
PAD ABD 8X10 STRL (GAUZE/BANDAGES/DRESSINGS) ×6 IMPLANT
PADDING CAST ABS 6INX4YD NS (CAST SUPPLIES) ×2
PADDING CAST ABS COTTON 6X4 NS (CAST SUPPLIES) IMPLANT
PADDING CAST COTTON 6X4 STRL (CAST SUPPLIES) ×6 IMPLANT
POSITIONER SURGICAL ARM (MISCELLANEOUS) ×3 IMPLANT
SET HNDPC FAN SPRY TIP SCT (DISPOSABLE) ×1 IMPLANT
SPONGE LAP 18X18 X RAY DECT (DISPOSABLE) ×3 IMPLANT
STAPLER VISISTAT 35W (STAPLE) ×5 IMPLANT
STRIP CLOSURE SKIN 1/2X4 (GAUZE/BANDAGES/DRESSINGS) ×4 IMPLANT
SUT MNCRL AB 4-0 PS2 18 (SUTURE) ×3 IMPLANT
SUT PDS AB 1 CT1 27 (SUTURE) ×9 IMPLANT
SUT VIC AB 2-0 CT1 27 (SUTURE) ×9
SUT VIC AB 2-0 CT1 TAPERPNT 27 (SUTURE) ×3 IMPLANT
SUT VLOC 180 0 24IN GS25 (SUTURE) IMPLANT
SWAB COLLECTION DEVICE MRSA (MISCELLANEOUS) ×3 IMPLANT
SWAB CULTURE ESWAB REG 1ML (MISCELLANEOUS) ×3 IMPLANT
TRAY FOLEY W/METER SILVER 14FR (SET/KITS/TRAYS/PACK) ×3 IMPLANT
TRAY FOLEY W/METER SILVER 16FR (SET/KITS/TRAYS/PACK) ×3 IMPLANT
WATER STERILE IRR 1500ML POUR (IV SOLUTION) ×3 IMPLANT
WRAP KNEE MAXI GEL POST OP (GAUZE/BANDAGES/DRESSINGS) ×6 IMPLANT

## 2016-04-25 NOTE — Interval H&P Note (Signed)
History and Physical Interval Note:  04/25/2016 3:57 PM  [name]  has presented today for surgery, with the diagnosis of INFECTED RIGHT TOTAL KNEE ARTHROPLASTY   The various methods of treatment have been discussed with the patient and family. After consideration of risks, benefits and other options for treatment, the patient has consented to  Procedure(s): RIGHT KNEE IRRIGATION AND DEBRIDEMENT WITH POLY EXCHANGE (Right) as a surgical intervention .  The patient's history has been reviewed, patient examined, no change in status, stable for surgery.  I have reviewed the patient's chart and labs.  Questions were answered to the patient's satisfaction.     Loanne Drilling

## 2016-04-25 NOTE — Anesthesia Postprocedure Evaluation (Signed)
Anesthesia Post Note  Patient: [name]  Procedure(s) Performed: Procedure(s) (LRB): RIGHT KNEE IRRIGATION AND DEBRIDEMENT WITH POLY EXCHANGE (Right)  Patient location during evaluation: PACU Anesthesia Type: General Level of consciousness: awake and alert Pain management: pain level controlled Vital Signs Assessment: post-procedure vital signs reviewed and stable Respiratory status: spontaneous breathing, nonlabored ventilation, respiratory function stable and patient connected to nasal cannula oxygen Cardiovascular status: blood pressure returned to baseline and stable Postop Assessment: no signs of nausea or vomiting Anesthetic complications: no    Last Vitals:  Filed Vitals:   04/25/16 1815 04/25/16 1827  BP: 150/83 138/72  Pulse: 59 60  Temp: 37 C 37.2 C  Resp: 19 20    Last Pain:  Filed Vitals:   04/25/16 1828  PainSc: 4                  Gilma Bessette L

## 2016-04-25 NOTE — H&P (Signed)
[name] is an 55 y.o. male.   Chief Complaint: Right knee pain HPI: 55 yo male with long complex history regarding his right knee who had acute onset of pain and swelling last week after starting Humara for treatment of his inflammatory arthritis. He has a history of a septic right TKA treated successfully with 2 stage revision several years ago and did fine until this episode last week. He had aspiration x 2 with elevated WBC count in the aspirate but no organisms in the cultures. Given the recurrent effusions x 2 post-aspiration accompanied by the severe pain and elevated synovial WBC count with his history of a septic knee, it is felt that the knee is acutely infected. He presents now for irrigation and debridement; polyethylene exchange and post-operative IV antibiotics  Past Medical History  Diagnosis Date  . Asthma   . GERD (gastroesophageal reflux disease)   . Hypertension   . Rheumatoid arthritis (HCC) dx'd ~ 1977  . Cellulitis of lower extremity     "usually RLL; this time it's got up to my lower abdoment" (02/11/2014)-series of antibiotics completed 02-28-14  . S/P PICC central line placement 03-16-14    right upper arm remains intact.05-11-14 removed 1 week ago.  . Diverticulosis   . PONV (postoperative nausea and vomiting)   . OSA (obstructive sleep apnea)     "quit wearing mask several years ago" (02/11/2014)    Past Surgical History  Procedure Laterality Date  . 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 Excisional hemorrhoidectomy    . Revision total knee arthroplasty Right 2012  . Picc line placement Right     right upper arm  . Foreign body removal Left     "BB" removal above left eye-teen yrs.  . Excisional total knee arthroplasty with antibiotic spacers Right 03/18/2014    Procedure: RIGHT KNEE RESECTION ARTHROPLASTY WITH ANTIBIOTIC SPACERS;   Surgeon: Loanne Drilling, MD;  Location: [facility];  Service: Orthopedics;  Laterality: Right;  . Total knee arthroplasty Right 05/21/2014    Procedure: RIGHT KNEE ARTHROPLASTY REINPLANTATION;  Surgeon: Loanne Drilling, MD;  Location: [facility];  Service: Orthopedics;  Laterality: Right;  . Incision and drainage Right 09/28/2014    Procedure: INCISION AND DRAINAGE RIGHT KNEE;  Surgeon: Loanne Drilling, MD;  Location: [facility];  Service: Orthopedics;  Laterality: Right;  . Spigelian hernia Right 12/04/2015    Procedure: INCARCERATED SPIGELIAN HERNIA REPAIR WITH MESH;  Surgeon: Violeta Gelinas, MD;  Location: [facility] OR;  Service: General;  Laterality: Right;    Family History  Problem Relation Age of Onset  . Colon cancer Mother   . Hypertension Mother   . Diabetes Mellitus II Paternal Grandmother    Social History:  reports that he quit smoking about 35 years ago. His smoking use included Cigarettes. He has a 3 pack-year smoking history. His smokeless tobacco use includes Snuff. He reports that he does not drink alcohol or use illicit drugs.  Allergies:  Allergies  Allergen Reactions  . Avelox [Moxifloxacin Hcl In Nacl] Hives, Shortness Of Breath and Itching  . Rocephin [Ceftriaxone Sodium In Dextrose] Hives and Itching  . Sulfa Antibiotics Itching  . Vancomycin Hives and Itching  . Erythrocin Rash  . Penicillins Rash    Has patient had a PCN reaction causing immediate rash, facial/tongue/throat swelling, SOB or lightheadedness with hypotension:  Yes Has patient had a PCN reaction causing severe rash involving mucus membranes or skin necrosis: No Has patient had a PCN reaction that required hospitalization No Has patient had a PCN reaction occurring within the last 10 years: No If all of the above answers are "NO", then may proceed with Cephalosporin use.    Medications Prior to Admission  Medication Sig Dispense Refill  . clindamycin (CLEOCIN) 300 MG capsule Take 600 mg by mouth 3 (three) times  daily. 4 days left  0  . diltiazem (CARDIZEM CD) 240 MG 24 hr capsule Take 240 mg by mouth daily.     . folic acid (FOLVITE) 1 MG tablet Take 1 mg by mouth daily.    . furosemide (LASIX) 40 MG tablet Take 40 mg by mouth daily.     Marland Kitchen losartan (COZAAR) 100 MG tablet Take 100 mg by mouth daily.     . methotrexate (RHEUMATREX) 2.5 MG tablet Take 15 mg by mouth every Wednesday.  4  . metoprolol (TOPROL-XL) 50 MG 24 hr tablet Take 50 mg by mouth daily.     . Probiotic Product (PROBIOTIC PO) Take 1 tablet by mouth daily.    . RABEprazole (ACIPHEX) 20 MG tablet Take 20 mg by mouth daily.    Marland Kitchen acetaminophen (TYLENOL) 325 MG tablet Take 2 tablets (650 mg total) by mouth every 6 (six) hours as needed for mild pain, moderate pain, fever or headache. (Patient not taking: Reported on 04/24/2016)    . Fluticasone-Salmeterol (ADVAIR) 250-50 MCG/DOSE AEPB Inhale 1 puff into the lungs 2 (two) times daily as needed (wheezing).    . ondansetron (ZOFRAN-ODT) 4 MG disintegrating tablet Take 1 tablet (4 mg total) by mouth every 6 (six) hours as needed for nausea. (Patient not taking: Reported on 04/24/2016) 10 tablet 0  . oxyCODONE-acetaminophen (PERCOCET/ROXICET) 5-325 MG tablet Take 1-2 tablets by mouth every 4 (four) hours as needed for moderate pain. (Patient not taking: Reported on 04/24/2016) 40 tablet 0    Results for orders placed or performed during the hospital encounter of 04/25/16 (from the past 48 hour(s))  APTT     Status: None   Collection Time: 04/25/16  2:55 PM  Result Value Ref Range   aPTT 30 24 - 37 seconds  CBC     Status: Abnormal   Collection Time: 04/25/16  2:55 PM  Result Value Ref Range   WBC 10.0 4.0 - 10.5 K/uL   RBC 4.21 (L) 4.22 - 5.81 MIL/uL   Hemoglobin 12.0 (L) 13.0 - 17.0 g/dL   HCT 37.5 (L) 39.0 - 52.0 %   MCV 89.1 78.0 - 100.0 fL   MCH 28.5 26.0 - 34.0 pg   MCHC 32.0 30.0 - 36.0 g/dL   RDW 15.1 11.5 - 15.5 %   Platelets 449 (H) 150 - 400 K/uL  Comprehensive metabolic panel      Status: Abnormal   Collection Time: 04/25/16  2:55 PM  Result Value Ref Range   Sodium 134 (L) 135 - 145 mmol/L   Potassium 5.0 3.5 - 5.1 mmol/L   Chloride 102 101 - 111 mmol/L   CO2 25 22 - 32 mmol/L   Glucose, Bld 104 (H) 65 - 99 mg/dL   BUN 17 6 - 20 mg/dL   Creatinine, Ser 1.02 0.61 - 1.24 mg/dL   Calcium 8.9 8.9 - 10.3 mg/dL   Total Protein 7.8 6.5 - 8.1 g/dL   Albumin 3.3 (L) 3.5 - 5.0 g/dL   AST 16  15 - 41 U/L   ALT 15 (L) 17 - 63 U/L   Alkaline Phosphatase 58 38 - 126 U/L   Total Bilirubin 0.6 0.3 - 1.2 mg/dL   GFR calc non Af Amer >60 >60 mL/min   GFR calc Af Amer >60 >60 mL/min    Comment: (NOTE) The eGFR has been calculated using the CKD EPI equation. This calculation has not been validated in all clinical situations. eGFR's persistently <60 mL/min signify possible Chronic Kidney Disease.    Anion gap 7 5 - 15  Protime-INR     Status: None   Collection Time: 04/25/16  2:55 PM  Result Value Ref Range   Prothrombin Time 15.2 11.6 - 15.2 seconds   INR 1.24 0.00 - 1.49  Type and screen Order type and screen if day of surgery is less than 15 days from draw of preadmission visit or order morning of surgery if day of surgery is greater than 6 days from preadmission visit.     Status: None   Collection Time: 04/25/16  2:55 PM  Result Value Ref Range   ABO/RH(D) O POS    Antibody Screen NEG    Sample Expiration 04/28/2016    No results found.  ROS  Blood pressure 113/71, pulse 66, temperature 99 F (37.2 C), temperature source Oral, resp. rate 20, height '5\' 8"'$  (1.727 m), weight 140.303 kg (309 lb 5 oz), SpO2 97 %. Physical Exam Physical Examination: General appearance - alert, well appearing, and in no distress Mental status - alert, oriented to person, place, and time Chest - clear to auscultation, no wheezes, rales or rhonchi, symmetric air entry Heart - normal rate, regular rhythm, normal S1, S2, no murmurs, rubs, clicks or gallops Abdomen - soft, nontender,  nondistended, no masses or organomegaly Neurological - alert, oriented, normal speech, no focal findings or movement disorder noted Right knee- warm; effusion present; no erythema, painful range of motion   Assessment/Plan Right knee septic arthritis- Plan right knee irrigation and debridement with polyethylene exchange. Discussed in detail with patient who elects to proceed.  [name], MD 04/25/2016, 3:51 PM

## 2016-04-25 NOTE — Anesthesia Procedure Notes (Signed)
Procedure Name: Intubation Date/Time: 04/25/2016 4:11 PM Performed by: Minerva Ends Pre-anesthesia Checklist: Patient identified, Emergency Drugs available, Suction available and Patient being monitored Patient Re-evaluated:Patient Re-evaluated prior to inductionOxygen Delivery Method: Circle System Utilized Preoxygenation: Pre-oxygenation with 100% oxygen Intubation Type: IV induction Ventilation: Mask ventilation without difficulty Laryngoscope Size: Miller and 2 Grade View: Grade I Tube type: Oral Number of attempts: 1 Airway Equipment and Method: Stylet Placement Confirmation: ETT inserted through vocal cords under direct vision,  positive ETCO2 and breath sounds checked- equal and bilateral Secured at: 23 cm Tube secured with: Tape Dental Injury: Teeth and Oropharynx as per pre-operative assessment  Comments: Smooth RSI--- good mask AW prior to SUX- intubation AM CRNA atraumatic--- many chipped and missing teeth prior to laryngoscopy---  [place] present

## 2016-04-25 NOTE — Transfer of Care (Signed)
Immediate Anesthesia Transfer of Care Note  Patient: [name]  Procedure(s) Performed: Procedure(s): RIGHT KNEE IRRIGATION AND DEBRIDEMENT WITH POLY EXCHANGE (Right)  Patient Location: PACU  Anesthesia Type:General  Level of Consciousness:  sedated, patient cooperative and responds to stimulation  Airway & Oxygen Therapy:Patient Spontanous Breathing and Patient connected to face mask oxgen  Post-op Assessment:  Report given to PACU RN and Post -op Vital signs reviewed and stable  Post vital signs:  Reviewed and stable  Last Vitals:  Filed Vitals:   04/25/16 1425  BP: 113/71  Pulse: 66  Temp: 37.2 C  Resp: 20    Complications: No apparent anesthesia complications

## 2016-04-25 NOTE — Anesthesia Preprocedure Evaluation (Addendum)
Anesthesia Evaluation  Patient identified by MRN, date of birth, ID band Patient awake    Reviewed: Allergy & Precautions, H&P , NPO status , Patient's Chart, lab work & pertinent test results  History of Anesthesia Complications (+) PONVNegative for: history of anesthetic complications  Airway Mallampati: III  TM Distance: >3 FB Neck ROM: Full    Dental  (+) Dental Advisory Given, Chipped Chipped left upper front tooth:   Pulmonary asthma , sleep apnea , former smoker,    Pulmonary exam normal breath sounds clear to auscultation       Cardiovascular hypertension, Pt. on medications and Pt. on home beta blockers + Peripheral Vascular Disease  Normal cardiovascular exam Rhythm:Regular Rate:Normal     Neuro/Psych negative neurological ROS  negative psych ROS   GI/Hepatic Neg liver ROS, GERD  Medicated and Controlled,  Endo/Other  Morbid obesitySuper obesity  Renal/GU negative Renal ROS  negative genitourinary   Musculoskeletal  (+) Arthritis , Rheumatoid disorders,    Abdominal (+) + obese,   Peds negative pediatric ROS (+)  Hematology negative hematology ROS (+)   Anesthesia Other Findings   Reproductive/Obstetrics negative OB ROS                           Anesthesia Physical Anesthesia Plan  ASA: III  Anesthesia Plan: General   Post-op Pain Management:    Induction: Intravenous  Airway Management Planned: Oral ETT  Additional Equipment:   Intra-op Plan:   Post-operative Plan: Extubation in OR  Informed Consent:   Plan Discussed with:   Anesthesia Plan Comments:        Anesthesia Quick Evaluation

## 2016-04-25 NOTE — Brief Op Note (Signed)
04/25/2016  5:04 PM  PATIENT:  [name]  54 y.o. male  PRE-OPERATIVE DIAGNOSIS:  INFECTED RIGHT TOTAL KNEE ARTHROPLASTY   POST-OPERATIVE DIAGNOSIS:  NFECTED RIGHT TOTAL KNEE ARTHROPLASTY   PROCEDURE:  Procedure(s): RIGHT KNEE IRRIGATION AND DEBRIDEMENT WITH POLY EXCHANGE (Right)  SURGEON:  Surgeon(s) and Role:    * Ollen Gross, MD - Primary  PHYSICIAN ASSISTANT:   ASSISTANTS: Leilani Able, PA-C   ANESTHESIA:   general  EBL:  Total I/O In: 1200 [I.V.:1200] Out: 150 [Urine:100; Blood:50]  BLOOD ADMINISTERED:none  DRAINS: none   LOCAL MEDICATIONS USED:  OTHER Exparel  COUNTS:  YES  TOURNIQUET:  28 minutes @ 300 mm Hg  DICTATION: .Other Dictation: Dictation Number [id number]  PLAN OF CARE: Admit to inpatient   PATIENT DISPOSITION:  PACU - hemodynamically stable.

## 2016-04-26 ENCOUNTER — Encounter (HOSPITAL_COMMUNITY): Payer: Self-pay | Admitting: Orthopedic Surgery

## 2016-04-26 DIAGNOSIS — B957 Other staphylococcus as the cause of diseases classified elsewhere: Secondary | ICD-10-CM

## 2016-04-26 DIAGNOSIS — M00061 Staphylococcal arthritis, right knee: Secondary | ICD-10-CM

## 2016-04-26 DIAGNOSIS — I10 Essential (primary) hypertension: Secondary | ICD-10-CM

## 2016-04-26 DIAGNOSIS — Y792 Prosthetic and other implants, materials and accessory orthopedic devices associated with adverse incidents: Secondary | ICD-10-CM

## 2016-04-26 DIAGNOSIS — T8453XA Infection and inflammatory reaction due to internal right knee prosthesis, initial encounter: Principal | ICD-10-CM

## 2016-04-26 DIAGNOSIS — M069 Rheumatoid arthritis, unspecified: Secondary | ICD-10-CM

## 2016-04-26 DIAGNOSIS — Z7952 Long term (current) use of systemic steroids: Secondary | ICD-10-CM

## 2016-04-26 LAB — CBC
HCT: 36.1 % — ABNORMAL LOW (ref 39.0–52.0)
Hemoglobin: 11.6 g/dL — ABNORMAL LOW (ref 13.0–17.0)
MCH: 28.5 pg (ref 26.0–34.0)
MCHC: 32.1 g/dL (ref 30.0–36.0)
MCV: 88.7 fL (ref 78.0–100.0)
PLATELETS: 445 10*3/uL — AB (ref 150–400)
RBC: 4.07 MIL/uL — AB (ref 4.22–5.81)
RDW: 14.9 % (ref 11.5–15.5)
WBC: 9.8 10*3/uL (ref 4.0–10.5)

## 2016-04-26 LAB — BASIC METABOLIC PANEL
Anion gap: 6 (ref 5–15)
BUN: 17 mg/dL (ref 6–20)
CO2: 26 mmol/L (ref 22–32)
Calcium: 8.9 mg/dL (ref 8.9–10.3)
Chloride: 104 mmol/L (ref 101–111)
Creatinine, Ser: 0.93 mg/dL (ref 0.61–1.24)
GFR calc Af Amer: >60 mL/min (ref >60)
GLUCOSE: 147 mg/dL — AB (ref 65–99)
POTASSIUM: 5.2 mmol/L — AB (ref 3.5–5.1)
Sodium: 136 mmol/L (ref 135–145)

## 2016-04-26 LAB — CK: Total CK: 42 U/L — ABNORMAL LOW (ref 49–397)

## 2016-04-26 MED ORDER — SODIUM CHLORIDE 0.9 % IV SOLN
6.0000 mg/kg | INTRAVENOUS | Status: DC
  Administered 2016-04-26 – 2016-04-29 (×4): 842 mg via INTRAVENOUS
  Filled 2016-04-26 (×4): qty 16.84

## 2016-04-26 MED ORDER — RIFAMPIN 300 MG PO CAPS
300.0000 mg | ORAL_CAPSULE | Freq: Every day | ORAL | Status: DC
  Administered 2016-04-26 – 2016-04-29 (×4): 300 mg via ORAL
  Filled 2016-04-26 (×4): qty 1

## 2016-04-26 NOTE — Progress Notes (Signed)
Subjective: 1 Day Post-Op Procedure(s) (LRB): RIGHT KNEE IRRIGATION AND DEBRIDEMENT WITH POLY EXCHANGE (Right) Patient reports pain as mild.   Patient seen in rounds by Dr. Lequita Halt. Patient is well, but has had some minor complaints of pain in the knee, requiring pain medications We will start therapy today. PICC line ordered. I.D. Consult today. Plan is to go Home after hospital stay.  Objective: Vital signs in last 24 hours: Temp:  [97.5 F (36.4 C)-99 F (37.2 C)] 98.1 F (36.7 C) (07/13 0601) Pulse Rate:  [55-66] 61 (07/13 0601) Resp:  [16-22] 17 (07/13 0601) BP: (113-153)/(52-90) 153/85 mmHg (07/13 0601) SpO2:  [91 %-100 %] 99 % (07/13 0601) Weight:  [140.303 kg (309 lb 5 oz)] 140.303 kg (309 lb 5 oz) (07/12 1425)  Intake/Output from previous day:  Intake/Output Summary (Last 24 hours) at 04/26/16 0745 Last data filed at 04/26/16 0600  Gross per 24 hour  Intake   2925 ml  Output   2070 ml  Net    855 ml    Intake/Output this shift: UOP 1075  Labs:  Recent Labs  04/25/16 1455 04/26/16 0401  HGB 12.0* 11.6*    Recent Labs  04/25/16 1455 04/26/16 0401  WBC 10.0 9.8  RBC 4.21* 4.07*  HCT 37.5* 36.1*  PLT 449* 445*    Recent Labs  04/25/16 1455 04/26/16 0401  NA 134* 136  K 5.0 5.2*  CL 102 104  CO2 25 26  BUN 17 17  CREATININE 1.02 0.93  GLUCOSE 104* 147*  CALCIUM 8.9 8.9    Recent Labs  04/25/16 1455  INR 1.24    EXAM General - Patient is Alert, Appropriate and Oriented Extremity - Neurovascular intact Sensation intact distally Dressing - dressing C/D/I Motor Function - intact, moving foot and toes well on exam.    Past Medical History  Diagnosis Date  . Asthma   . GERD (gastroesophageal reflux disease)   . Hypertension   . Rheumatoid arthritis (HCC) dx'd ~ 1977  . Cellulitis of lower extremity     "usually RLL; this time it's got up to my lower abdoment" (02/11/2014)-series of antibiotics completed 02-28-14  . S/P PICC  central line placement 03-16-14    right upper arm remains intact.05-11-14 removed 1 week ago.  . Diverticulosis   . PONV (postoperative nausea and vomiting)   . OSA (obstructive sleep apnea)     "quit wearing mask several years ago" (02/11/2014)    Assessment/Plan: 1 Day Post-Op Procedure(s) (LRB): RIGHT KNEE IRRIGATION AND DEBRIDEMENT WITH POLY EXCHANGE (Right) Principal Problem:   Infection of prosthetic right knee joint (HCC) Active Problems:   Septic arthritis of knee, right (HCC)  Estimated body mass index is 47.04 kg/(m^2) as calculated from the following:   Height as of this encounter: 5\' 8"  (1.727 m).   Weight as of this encounter: 140.303 kg (309 lb 5 oz). Advance diet Up with therapy  DVT Prophylaxis - Aspirin Weight-Bearing as tolerated to right leg D/C O2 and Pulse OX and try on Room Air  I.D. Consult 1993 - Right Total Knee Replacement 2002 - Left Total Hip Replacment 2008 - Left Total Knee Replacement 2011 - Right Total Hip Replacement March 2012 - Full Revision Right Total Knee 01/10/2013 - 01/13/2013 - Hospital Admission for Cellulitis Right Leg 02/11/2014 - 02/15/2014 - Hospital Admission for Cellulitis Abdominal Wall 02/23/2014 - Office Aspiration - Positive for Serratia 03/18/2014 - Right Knee Resection/Antibiotic Spacer 05/21/2014 - Right Total Knee Reimplantation 09/16/2014 - Office  Visit Aspiration of Bursa (no joint fluid) - Positive for moderate Staph species (coag negative) 09/28/2014 - I&D Right Knee Prepatellar Bursa (PICC treatment) 04/17/2016 - ED Visit - Aspiration of Right Knee -   WBC,Synovial - 14,200  Culture - Rare Staphylococcus Lugdenensis  CRP - 21.4  Sed Rate - 53 04/25/2016 - RIGHT KNEE IRRIGATION AND DEBRIDEMENT WITH POLY EXCHANGE (Right)  Operative Cultures - few WBCs, no orgs seen  PICC ordered Multiple allergies Consult ID for recommendations.  Avel Peace, PA-C Orthopaedic Surgery 04/26/2016, 7:45 AM

## 2016-04-26 NOTE — Op Note (Signed)
[name], [name]                 ACCOUNT NO.:  [id number]  MEDICAL RECORD NO.:  [id number]  LOCATION:  1612                         FACILITY:  [facility]  PHYSICIAN:  Ollen Gross, M.D.    DATE OF BIRTH:  15-Sep-1961  DATE OF PROCEDURE:  04/25/2016 DATE OF DISCHARGE:                              OPERATIVE REPORT   PREOPERATIVE DIAGNOSIS:  Infected right total knee arthroplasty.  POSTOPERATIVE DIAGNOSIS:  Infected right total knee arthroplasty.  PROCEDURE:  Right knee irrigation debridement with polyethylene exchange.  SURGEON:  Ollen Gross, M.D.  ASSISTANT:  Luna Glasgow, [facility].  ANESTHESIA:  General.  ESTIMATED BLOOD LOSS:  Minimal.  DRAINS:  None.  COMPLICATIONS:  None.  CONDITION:  Stable to recovery.  BRIEF CLINICAL NOTE:  [name] is a 55 year old male with very complex history regards to his right knee.  He has had multiple total knee primary revision procedures including a 2 stage revision for infection several years ago.  He was doing fine up until last week.  He was given a dose of Humira for inflammatory arthritis and shortly afterwards developed a painful swollen right knee.  He has had aspirations x2, which showed an elevated white blood cell count and did not show any bacteria of bacterial growth.  Given his history of infection with the synovial fluid showing an elevated white cell count.  The large effusions, which were recurrent and has significant pain.  It was felt as though he may have had an infection in the total knee arthroplasty. Given that he has had multiple revisions and he is not want to do resection arthroplasty as he would not be left with much bone.  We attempted today to perform irrigation debridement, polyethylene exchange, and placement of antibiotic impregnated beads.  He presents now for that procedure.  PROCEDURE IN DETAIL:  After successful administration of general anesthetic, a tourniquet was placed on his right thigh.  Right  lower extremity prepped and draped in the usual sterile fashion.  Extremities wrapped in Esmarch, tourniquet inflated to 300 mmHg.  A midline incision was made with a 10 blade through the subcutaneous tissue to the extensor mechanism.  A fresh blade was used to make a medial arthrotomy.  There was clear synovial fluid present in the joint.  It sent for Gram stain, culture and sensitivity.  The hypertrophic synovial tissue was then debrided back to normal-appearing tissue.  I then subluxed the tibia forward.  I was able to remove the tibial polyethylene, which was a size 4 TC3 rotating platform insert, which was 22.5 mm thick.  This was removed and the back of the joint cleaned out.  I then thoroughly irrigated the joint with 3 L of saline using pulsatile lavage.  About 250 mL of Betadine was then poured into the joint, then circulated throughout the tissues.  It was left intact for approximately 3 minutes to hopefully have bacteria _______ function.  After 3 minutes, I then irrigated the tissues and joint again with 3 L of saline with pulsatile lavage.  Joints again inspected.  The tissue appeared normal now.  All the components were well fixed and in good position.  Since there was easy  remove of the 22.5 thickness insert, I felt that his stability will be better served by going to a 25-mm thickness.  We placed a 25-mm thick TC3 rotating platform insert for the size 4 prosthesis.  It was placed into the tibia and then in tibia it was reduced with excellent stability throughout full range of motion.  Further irrigation was performed and then the stimulant beads mixed with 160 mg of gentamicin were passed into the joint.  The arthrotomy was then closed with a running #1 V-Loc suture.  Tourniquet released total time of 28 minutes.  Minor bleeding was stopped with electrocautery.  Prior to this, I injected the extensor mechanism, periosteum of the femur and subcu tissues a 20 mL of  Exparel mixed with 30 mL of saline.  An additional 20 mL of 0.25% Marcaine injected into the same tissues.  The arthrotomy was closed and the subcu was closed with interrupted 2-0 Vicryl and skin closed with staples. Incisions cleaned and dried and a bulky sterile dressing applied.  He was then placed into a knee immobilizer, awakened and transported to recovery in stable condition.  Note that a surgical assistant was a medical necessity for this procedure to allow for retraction of vital ligaments, neurovascular structures, and for appropriate debridement of the knee as well as for proper removal of the old prosthesis and placement of the new.     Ollen Gross, M.D.     FA/MEDQ  D:  04/25/2016  T:  04/26/2016  Job:  [id number]

## 2016-04-26 NOTE — Progress Notes (Addendum)
Pharmacy Antibiotic Note  [name] is a 55 y.o. 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  ID following. Pharmacy has been consulted for Daptomycin dosing and MD dosing rifampin once daily.  Synovial fluid culture right knee pending, no growth < 24 hours. Pt has allergies to avelox (hives, SOB, itching), CTX (hives, itching), sulfa (itching), vancomycin (hives, itching), penicillins (rash), and erythromycin (rash).  Plan for at least 6 weeks therapy.    Plan: Check baseline CK.  Daptomycin 6mg /kg (total body weight) once daily = 842mg  IV q24h F/u weekly CK or sooner if renal insufficiency or elevations in CK F/u muscle pain, weakness, or signs and symptoms of peripheral neuropathy during use F/u renal function F/u cultures  Height: 5\' 8"  (172.7 cm) Weight: (!) 309 lb 5 oz (140.303 kg) IBW/kg (Calculated) : 68.4  Temp (24hrs), Avg:98.2 F (36.8 C), Min:97.5 F (36.4 C), Max:98.9 F (37.2 C)   Recent Labs Lab 04/25/16 1455 04/26/16 0401  WBC 10.0 9.8  CREATININE 1.02 0.93    Estimated Creatinine Clearance: 123.4 mL/min (by C-G formula based on Cr of 0.93).    Allergies  Allergen Reactions  . Avelox [Moxifloxacin Hcl In Nacl] Hives, Shortness Of Breath and Itching  . Rocephin [Ceftriaxone Sodium In Dextrose] Hives and Itching  . Sulfa Antibiotics Itching  . Vancomycin Hives and Itching  . Erythrocin Rash  . Penicillins Rash    Has patient had a PCN reaction causing immediate rash, facial/tongue/throat swelling, SOB or lightheadedness with hypotension: Yes Has patient had a PCN reaction causing severe rash involving mucus membranes or skin necrosis: No Has patient had a PCN reaction that required hospitalization No Has patient had a PCN reaction occurring within the last 10 years: No If all of the above answers  are "NO", then may proceed with Cephalosporin use.    Antimicrobials this admission: Clindamycin 7/12-7/13 x 2 doses (peri-op) 7/13 Rifampin >>  7/13 Daptomycin >>   Dose adjustments this admission:   Microbiology results: 7/12 synovial fluid right knee:  Gram stain: few WBC, no organisms.  Culture: No growth < 24h  Thank you for allowing pharmacy to be a part of this patient's care.  8/13, PharmD, BCPS 04/26/2016, 2:56 PM  Pager: [phone]

## 2016-04-26 NOTE — Consult Note (Addendum)
[facility] for Infectious Disease  Date of Admission:  04/25/2016  Date of Consult:  04/26/2016  Reason for Consult: septic arthritis Referring Physician: Perkins/Alusio  Impression/Recommendation Septic prosthetic joint Would start pt on cubicin Check CK Will start rifampin as well, Discussed side effects with pt.  Discussed with pharm. Will interact with his diltiazem.  PIC ordered  RA On steroids  HTN Watch BP with dilt-rif interaction.   Thank you so much for this interesting consult,   Bobby Rumpf (pager) [phone] [email or web address]  [name] is an 55 y.o. male.  HPI: 55 yo M with hx of RA (prev on MTX and prednisone) and recurrent episodes of cellulitis.  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 His Cx 03-2014 grew Serratia and he was treated with 6 weeks of ceftriaxone. His wife also states that he also had Staph in 2015.  He had a Humira injection 6-24 and by 7-2 developed worsening effusions in his knee. He also developed f/c.  He returns to hospital 7-12 after having joint effusion and aspiration x 2. His was noted to have elevated WBC in his knee fluid (14k, 100% N on 7-4), his Cx grew Staph lugdenensis.  He underwent I & D, poly-exchange on 7-12.  His hx is also notable for many antibiotic allergies. He was given clindamycin peri-operatively. He was also started on steroids.   Past Medical History  Diagnosis Date  . Asthma   . GERD (gastroesophageal reflux disease)   . Hypertension   . Rheumatoid arthritis ([place]) dx'd ~ 1977  . Cellulitis of lower extremity     "usually RLL; this time it's got up to my lower abdoment" (02/11/2014)-series of antibiotics completed 02-28-14  . S/P PICC central line placement 03-16-14    right upper arm remains intact.05-11-14 removed 1 week ago.  . Diverticulosis   . PONV (postoperative nausea and vomiting)   . OSA (obstructive sleep apnea)     "quit  wearing mask several years ago" (02/11/2014)    Past Surgical History  Procedure Laterality Date  . 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 Excisional hemorrhoidectomy    . Revision total knee arthroplasty Right 2012  . Picc line placement Right     right upper arm  . Foreign body removal Left     "BB" removal above left eye-teen yrs.  . Excisional total knee arthroplasty with antibiotic spacers Right 03/18/2014    Procedure: RIGHT KNEE RESECTION ARTHROPLASTY WITH ANTIBIOTIC SPACERS;  Surgeon: [name], MD;  Location: [facility];  Service: Orthopedics;  Laterality: Right;  . Total knee arthroplasty Right 05/21/2014    Procedure: RIGHT KNEE ARTHROPLASTY REINPLANTATION;  Surgeon: [name], MD;  Location: [facility];  Service: Orthopedics;  Laterality: Right;  . Incision and drainage Right 09/28/2014    Procedure: INCISION AND DRAINAGE RIGHT KNEE;  Surgeon: [name], MD;  Location: [facility];  Service: Orthopedics;  Laterality: Right;  . 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 I&d knee with poly exchange Right 04/25/2016    Procedure: RIGHT KNEE IRRIGATION AND DEBRIDEMENT WITH POLY EXCHANGE;  Surgeon: Gaynelle Arabian, MD;  Location: [facility];  Service: Orthopedics;  Laterality: Right;     Allergies  Allergen Reactions  . Avelox [Moxifloxacin Hcl In Nacl] Hives, Shortness Of Breath and Itching  . Rocephin [Ceftriaxone Sodium In Dextrose] Hives and Itching  . Sulfa Antibiotics Itching  . Vancomycin Hives and Itching  . Erythrocin Rash  . Penicillins Rash    Has patient had a PCN reaction causing immediate rash, facial/tongue/throat swelling, SOB or lightheadedness with hypotension: Yes Has patient had a PCN  reaction causing severe rash involving mucus membranes or skin necrosis: No Has patient had a PCN reaction that required hospitalization No Has patient had a PCN reaction occurring within the last 10 years: No If all of the above answers are "NO", then may proceed with Cephalosporin use.    Medications:  Scheduled: . acetaminophen  1,000 mg Oral Q6H  . aspirin EC  325 mg Oral Q breakfast  . diltiazem  240 mg Oral Daily  . docusate sodium  100 mg Oral BID  . folic acid  1 mg Oral Daily  . furosemide  40 mg Oral Daily  . losartan  100 mg Oral Daily  . metoprolol succinate  50 mg Oral Daily  . pantoprazole  40 mg Oral Daily    Abtx:  Anti-infectives    Start     Dose/Rate Route Frequency Ordered Stop   04/26/16 0600  clindamycin (CLEOCIN) IVPB 900 mg     900 mg 100 mL/hr over 30 Minutes Intravenous On call to O.R. 04/25/16 1417 04/25/16 1601   04/25/16 2200  clindamycin (CLEOCIN) IVPB 600 mg     600 mg 100 mL/hr over 30 Minutes Intravenous Every 6 hours 04/25/16 1845 04/26/16 0625   04/25/16 1641  gentamicin (GARAMYCIN) injection  Status:  Discontinued       As needed 04/25/16 1642 04/25/16 1727      Total days of antibiotics: 1 clinda          Social History:  reports that he quit smoking about 35 years ago. His smoking use included Cigarettes. He has a 3 pack-year smoking history. His smokeless tobacco use includes Snuff. He reports that he does not drink alcohol or use illicit drugs.  Family History  Problem Relation Age of Onset  . Colon cancer Mother   . Hypertension Mother   . Diabetes Mellitus II Paternal Grandmother     General ROS: normal BM, normal urine. no hx of prev rifampin. see HPI.   Please see HPI. 12 point ROS o/w (-)   Blood pressure 146/76, pulse 69, temperature 97.8 F (36.6 C), temperature source Oral, resp. rate 18, height 5\' 8"  (1.727 m), weight 140.303 kg (309 lb 5 oz), SpO2 100 %. General appearance: alert, cooperative and no distress Eyes:  negative findings: conjunctivae and sclerae normal and pupils equal, round, reactive to light and accomodation Throat: normal findings: oropharynx pink & moist without lesions or evidence of thrush Neck: no adenopathy and supple, symmetrical, trachea midline Lungs: clear to auscultation bilaterally Heart: regular rate and rhythm Abdomen: normal findings: bowel sounds normal and soft, non-tender Extremities: RLE wrapped.  Neurologic: Sensory: grossly normal light touch.    Results for orders placed or performed during the hospital encounter of 04/25/16 (from the past 48 hour(s))  APTT     Status: None   Collection Time: 04/25/16  2:55 PM  Result Value Ref Range   aPTT 30 24 - 37 seconds  CBC     Status: Abnormal   Collection Time: 04/25/16  2:55  PM  Result Value Ref Range   WBC 10.0 4.0 - 10.5 K/uL   RBC 4.21 (L) 4.22 - 5.81 MIL/uL   Hemoglobin 12.0 (L) 13.0 - 17.0 g/dL   HCT 37.5 (L) 39.0 - 52.0 %   MCV 89.1 78.0 - 100.0 fL   MCH 28.5 26.0 - 34.0 pg   MCHC 32.0 30.0 - 36.0 g/dL   RDW 15.1 11.5 - 15.5 %   Platelets 449 (H) 150 - 400 K/uL  Comprehensive metabolic panel     Status: Abnormal   Collection Time: 04/25/16  2:55 PM  Result Value Ref Range   Sodium 134 (L) 135 - 145 mmol/L   Potassium 5.0 3.5 - 5.1 mmol/L   Chloride 102 101 - 111 mmol/L   CO2 25 22 - 32 mmol/L   Glucose, Bld 104 (H) 65 - 99 mg/dL   BUN 17 6 - 20 mg/dL   Creatinine, Ser 1.02 0.61 - 1.24 mg/dL   Calcium 8.9 8.9 - 10.3 mg/dL   Total Protein 7.8 6.5 - 8.1 g/dL   Albumin 3.3 (L) 3.5 - 5.0 g/dL   AST 16 15 - 41 U/L   ALT 15 (L) 17 - 63 U/L   Alkaline Phosphatase 58 38 - 126 U/L   Total Bilirubin 0.6 0.3 - 1.2 mg/dL   GFR calc non Af Amer >60 >60 mL/min   GFR calc Af Amer >60 >60 mL/min    Comment: (NOTE) The eGFR has been calculated using the CKD EPI equation. This calculation has not been validated in all clinical situations. eGFR's persistently <60 mL/min signify possible Chronic  Kidney Disease.    Anion gap 7 5 - 15  Protime-INR     Status: None   Collection Time: 04/25/16  2:55 PM  Result Value Ref Range   Prothrombin Time 15.2 11.6 - 15.2 seconds   INR 1.24 0.00 - 1.49  Type and screen Order type and screen if day of surgery is less than 15 days from draw of preadmission visit or order morning of surgery if day of surgery is greater than 6 days from preadmission visit.     Status: None   Collection Time: 04/25/16  2:55 PM  Result Value Ref Range   ABO/RH(D) O POS    Antibody Screen NEG    Sample Expiration 04/28/2016   Body fluid culture     Status: None (Preliminary result)   Collection Time: 04/25/16  4:00 PM  Result Value Ref Range   Specimen Description SYNOVIAL FLUID RIGHT KNEE    Special Requests NONE    Gram Stain      FEW WBC PRESENT,BOTH PMN AND MONONUCLEAR NO ORGANISMS SEEN Performed at [facility]    Culture PENDING    Report Status PENDING   CBC     Status: Abnormal   Collection Time: 04/26/16  4:01 AM  Result Value Ref Range   WBC 9.8 4.0 - 10.5 K/uL   RBC 4.07 (L) 4.22 - 5.81 MIL/uL   Hemoglobin 11.6 (L) 13.0 - 17.0 g/dL   HCT 36.1 (L) 39.0 - 52.0 %   MCV 88.7 78.0 - 100.0 fL   MCH 28.5 26.0 - 34.0 pg   MCHC 32.1 30.0 - 36.0 g/dL   RDW 14.9 11.5 - 15.5 %   Platelets 445 (H) 150 - 400 K/uL  Basic metabolic panel     Status: Abnormal   Collection Time: 04/26/16  4:01 AM  Result Value Ref Range   Sodium  136 135 - 145 mmol/L   Potassium 5.2 (H) 3.5 - 5.1 mmol/L   Chloride 104 101 - 111 mmol/L   CO2 26 22 - 32 mmol/L   Glucose, Bld 147 (H) 65 - 99 mg/dL   BUN 17 6 - 20 mg/dL   Creatinine, Ser 0.93 0.61 - 1.24 mg/dL   Calcium 8.9 8.9 - 10.3 mg/dL   GFR calc non Af Amer >60 >60 mL/min   GFR calc Af Amer >60 >60 mL/min    Comment: (NOTE) The eGFR has been calculated using the CKD EPI equation. This calculation has not been validated in all clinical situations. eGFR's persistently <60 mL/min signify possible Chronic  Kidney Disease.    Anion gap 6 5 - 15      Component Value Date/Time   SDES SYNOVIAL FLUID RIGHT KNEE 04/25/2016 1600   SPECREQUEST NONE 04/25/2016 1600   CULT PENDING 04/25/2016 1600   REPTSTATUS PENDING 04/25/2016 1600   No results found. Recent Results (from the past 240 hour(s))  Body fluid culture     Status: None   Collection Time: 04/17/16  3:50 PM  Result Value Ref Range Status   Specimen Description SYNOVIAL FLUID KNEE  Final   Special Requests FLUID ON SWAB  Final   Gram Stain   Final    ABUNDANT WBC PRESENT, PREDOMINANTLY PMN NO ORGANISMS SEEN    Culture   Final    RARE STAPHYLOCOCCUS LUGDUNENSIS CRITICAL RESULT CALLED TO, READ BACK BY AND VERIFIED WITH: RN Ronn Melena [id number] '@1230'$  MLM Performed at [facility]    Report Status 04/21/2016 FINAL  Final   Organism ID, Bacteria STAPHYLOCOCCUS LUGDUNENSIS  Final      Susceptibility   Staphylococcus lugdunensis - MIC*    CIPROFLOXACIN <=0.5 SENSITIVE Sensitive     ERYTHROMYCIN >=8 RESISTANT Resistant     GENTAMICIN <=0.5 SENSITIVE Sensitive     OXACILLIN 1 SENSITIVE Sensitive     TETRACYCLINE >=16 RESISTANT Resistant     VANCOMYCIN <=0.5 SENSITIVE Sensitive     TRIMETH/SULFA <=10 SENSITIVE Sensitive     CLINDAMYCIN >=8 RESISTANT Resistant     RIFAMPIN <=0.5 SENSITIVE Sensitive     Inducible Clindamycin NEGATIVE Sensitive     * RARE STAPHYLOCOCCUS LUGDUNENSIS  Body fluid culture     Status: None (Preliminary result)   Collection Time: 04/25/16  4:00 PM  Result Value Ref Range Status   Specimen Description SYNOVIAL FLUID RIGHT KNEE  Final   Special Requests NONE  Final   Gram Stain   Final    FEW WBC PRESENT,BOTH PMN AND MONONUCLEAR NO ORGANISMS SEEN Performed at [facility]    Culture PENDING  Incomplete   Report Status PENDING  Incomplete      04/26/2016, 2:06 PM     LOS: 1 day    Records and images were personally reviewed where available.

## 2016-04-26 NOTE — Progress Notes (Signed)
Occupational Therapy Evaluation Patient Details Name: [name] MRN: [id number] DOB: [birth year] Today's Date: 04/26/2016    History of Present Illness RIGHT KNEE IRRIGATION AND DEBRIDEMENT WITH POLY EXCHANGE (Right)   Clinical Impression   Patient s/p above procedure and presents to OT with decreased ADL independence and safety due to the deficits listed below. He will benefit from skilled OT to maximize function and to facilitate discharge to the venue listed below. OT will follow.    Follow Up Recommendations  No OT follow up;Supervision/Assistance - 24 hour    Equipment Recommendations  None recommended by OT    Recommendations for Other Services       Precautions / Restrictions Precautions Precautions: Knee;Fall Required Braces or Orthoses: Knee Immobilizer - Right Restrictions Weight Bearing Restrictions: No Other Position/Activity Restrictions: WBAT      Mobility Bed Mobility Overal bed mobility: Needs Assistance Bed Mobility: Supine to Sit     Supine to sit: Min assist;+2 for safety/equipment;HOB elevated     General bed mobility comments: needed assistance with RLE and guarding during trunk elevation  Transfers Overall transfer level: Needs assistance Equipment used: Rolling walker (2 wheeled) Transfers: Sit to/from Stand Sit to Stand: From elevated surface;Min assist;+2 safety/equipment         General transfer comment: needed cues UE/LE placement    Balance                                            ADL Overall ADL's : Needs assistance/impaired Eating/Feeding: Independent;Sitting   Grooming: Set up;Sitting           Upper Body Dressing : Minimal assistance;Sitting   Lower Body Dressing: Total assistance;Sit to/from stand   Toilet Transfer: Minimal assistance;Cueing for safety;Cueing for sequencing;Stand-pivot;RW Toilet Transfer Details (indicate cue type and reason): simulated during mobility tasks          Functional mobility during ADLs: Minimal assistance;+2 for safety/equipment;Rolling walker General ADL Comments: Patient still has foley this am. Focused session on functional mobility tasks in preparation for ADLs. Will address toilet and shower transfers next visit.     Vision     Perception     Praxis      Pertinent Vitals/Pain Pain Assessment: 0-10 Pain Score: 4  Pain Location: R knee Pain Descriptors / Indicators: Aching;Burning;Sore Pain Intervention(s): Limited activity within patient's tolerance;Monitored during session;Repositioned;RN gave pain meds during session     Hand Dominance     Extremity/Trunk Assessment Upper Extremity Assessment Upper Extremity Assessment: Overall WFL for tasks assessed (history of RA; note some joint deformities in B hands)   Lower Extremity Assessment Lower Extremity Assessment: Defer to PT evaluation       Communication Communication Communication: No difficulties   Cognition Arousal/Alertness: Awake/alert Behavior During Therapy: WFL for tasks assessed/performed Overall Cognitive Status: Within Functional Limits for tasks assessed                     General Comments       Exercises       Shoulder Instructions      Home Living Family/patient expects to be discharged to:: Private residence Living Arrangements: Spouse/significant other Available Help at Discharge: Family Type of Home: House Home Access: Stairs to enter [employer] of Steps: 4-front, 3 back   Home Layout: One level     Bathroom Shower/Tub: Walk-in shower  Bathroom Toilet: Handicapped height     Home Equipment: Environmental consultant - 2 wheels;Cane - single point;Crutches;Bedside commode;Shower seat;Wheelchair - manual          Prior Functioning/Environment Level of Independence: Independent with assistive device(s)        Comments: using RW vs crutches vs cane vs wheelchair just prior to surgery. Needed assistance with socks/shoes  even PTA.    OT Diagnosis: Acute pain   OT Problem List: Decreased strength;Decreased range of motion;Decreased knowledge of use of DME or AE;Pain   OT Treatment/Interventions: Self-care/ADL training;DME and/or AE instruction;Therapeutic activities;Patient/family education    OT Goals(Current goals can be found in the care plan section) Acute Rehab OT Goals Patient Stated Goal: regain independence OT Goal Formulation: With patient Time For Goal Achievement: 05/10/16 Potential to Achieve Goals: Good ADL Goals Pt Will Transfer to Toilet: with supervision;ambulating;bedside commode Pt Will Perform Toileting - Clothing Manipulation and hygiene: with supervision;sit to/from stand Pt Will Perform Tub/Shower Transfer: Shower transfer;with supervision;ambulating;shower seat;rolling walker  OT Frequency: Min 2X/week   Barriers to D/C:            Co-evaluation PT/OT/SLP Co-Evaluation/Treatment: Yes Reason for Co-Treatment: For patient/therapist safety PT goals addressed during session: Mobility/safety with mobility OT goals addressed during session: ADL's and self-care      End of Session Equipment Utilized During Treatment: Rolling walker;Right knee immobilizer Nurse Communication: Mobility status  Activity Tolerance: Patient tolerated treatment well Patient left: in chair;with call bell/phone within reach;with chair alarm set   Time: [phone] OT Time Calculation (min): 19 min Charges:  OT General Charges $OT Visit: 1 Procedure OT Evaluation $OT Eval Low Complexity: 1 Procedure G-Codes:    Kyvon Hu A 05-23-16, 10:22 AM

## 2016-04-26 NOTE — Progress Notes (Signed)
Physical Therapy Treatment Patient Details Name: [name] MRN: [id number] DOB: [birth year] Today's Date: 04/26/2016    History of Present Illness 55 yo male s/p R knee I&D, poly exchange 04/25/16. Hx of RA, multiple surgeries R knee.     PT Comments    Progressing with mobility. Will follow and continue to progress activity.   Follow Up Recommendations  Home health PT     Equipment Recommendations  None recommended by PT    Recommendations for Other Services       Precautions / Restrictions Precautions Precautions: Fall;Knee Required Braces or Orthoses: Knee Immobilizer - Right Restrictions Weight Bearing Restrictions: No RLE Weight Bearing: Weight bearing as tolerated    Mobility  Bed Mobility Overal bed mobility: Needs Assistance Bed Mobility: Sit to Supine       Sit to supine: Min assist   General bed mobility comments: Assist for L LE.  Transfers Overall transfer level: Needs assistance Equipment used: Rolling walker (2 wheeled) Transfers: Sit to/from Stand Sit to Stand: Min assist         General transfer comment: Assist to rise, stabilize, control descent. VCs safety, technique, hand/LE placement.   Ambulation/Gait Ambulation/Gait assistance: Min assist Ambulation Distance (Feet): 65 Feet Assistive device: Rolling walker (2 wheeled) Gait Pattern/deviations: Step-to pattern;Antalgic;Decreased stance time - right     General Gait Details: assist to stabilize intermittently. VCs safety, sequence. Dyspnea 2/4.   Stairs            Wheelchair Mobility    Modified Rankin (Stroke Patients Only)       Balance                                    Cognition Arousal/Alertness: Awake/[name] Behavior During Therapy: WFL for tasks assessed/performed Overall Cognitive Status: Within Functional Limits for tasks assessed                      Exercises      General Comments        Pertinent Vitals/Pain Pain  Assessment: 0-10 Pain Score: 4  Pain Location: R knee Pain Descriptors / Indicators: Aching;Sore Pain Intervention(s): Monitored during session;Repositioned;Ice applied    Home Living                      Prior Function            PT Goals (current goals can now be found in the care plan section) Progress towards PT goals: Progressing toward goals    Frequency  7X/week    PT Plan Current plan remains appropriate    Co-evaluation             End of Session   Activity Tolerance: Patient tolerated treatment well Patient left: in bed;with call bell/phone within reach;with family/visitor present     Time: 0814-4818 PT Time Calculation (min) (ACUTE ONLY): 8 min  Charges:  $Gait Training: 8-22 mins                    G Codes:      [name], MPT Pager: [phone]

## 2016-04-26 NOTE — Care Management Note (Signed)
Case Management Note  Patient Details  Name: [name] MRN: [id number] Date of Birth: 05/30/1961  Subjective/Objective:                  R knee I&D, poly exchange 04/25/16 Action/Plan: CM met with pt and spouse of pt who wish to remain with [facility] and HHRN Deno Etienne for [facility] IV ABX management.   Expected Discharge Date:                  Expected Discharge Plan:  [place]  In-House Referral:     Discharge planning Services  CM Consult  Post Acute Care Choice:    Choice offered to:  Patient, Spouse  DME Arranged:  IV pump/equipment DME Agency:     HH Arranged:    [place] Agency:     Status of Service:  In process, will continue to follow  If discussed at Long Length of Stay Meetings, dates discussed:    Additional Comments: CM met with pt and spouse of pt who wish to remain with [facility] and HHRN Deno Etienne for [facility] IV ABX management; [facility] at [phone].  No orders have been placed; No prescriptions have been written (waiting on ID consult); No PICC has been placed; case management will continue to follow to arrange. Dellie Catholic, RN 04/26/2016, 1:39 PM

## 2016-04-26 NOTE — Evaluation (Signed)
Physical Therapy Evaluation Patient Details Name: [name] MRN: [id number] DOB: 1961/06/24 Today's Date: 04/26/2016   History of Present Illness  55 yo male s/p R knee I&D, poly exchange 04/25/16. Hx of RA, multiple surgeries R knee.   Clinical Impression  On eval, pt required Min assist +2 for safety/equipment. Pain rated 4/10 with activity. Dyspnea 2/4 with ambulation. Will follow and progress activity as tolerated.     Follow Up Recommendations Home health PT    Equipment Recommendations  None recommended by PT    Recommendations for Other Services       Precautions / Restrictions Precautions Precautions: Fall;Knee Required Braces or Orthoses: Knee Immobilizer - Right Restrictions Weight Bearing Restrictions: No RLE Weight Bearing: Weight bearing as tolerated Other Position/Activity Restrictions: WBAT      Mobility  Bed Mobility Overal bed mobility: Needs Assistance Bed Mobility: Supine to Sit     Supine to sit: Min assist;+2 for safety/equipment;HOB elevated     General bed mobility comments: Assist for L LE. Increased time.   Transfers Overall transfer level: Needs assistance Equipment used: Rolling walker (2 wheeled) Transfers: Sit to/from Stand Sit to Stand: Min assist;+2 safety/equipment;From elevated surface         General transfer comment: Assist to rise, stabilize, control descent. VCs safety, technique, hand/LE placement.   Ambulation/Gait Ambulation/Gait assistance: Min assist;+2 safety/equipment Ambulation Distance (Feet): 50 Feet Assistive device: Rolling walker (2 wheeled) Gait Pattern/deviations: Step-to pattern;Antalgic;Decreased stance time - right     General Gait Details: assist to stabilize intermittently. VCs safety, sequence. Dyspnea 2/4. Followed with recliner.  Stairs            Wheelchair Mobility    Modified Rankin (Stroke Patients Only)       Balance                                              Pertinent Vitals/Pain Pain Assessment: 0-10 Pain Score: 4  Pain Location: R knee Pain Descriptors / Indicators: Aching;Sore Pain Intervention(s): Monitored during session;Repositioned;RN gave pain meds during session    Home Living Family/patient expects to be discharged to:: Private residence Living Arrangements: Spouse/significant other Available Help at Discharge: Family Type of Home: House Home Access: Stairs to enter   [employer] of Steps: 4-front, 3 back Home Layout: One level Home Equipment: Environmental consultant - 2 wheels;Cane - single point;Crutches;Bedside commode;Shower seat;Wheelchair - manual      Prior Function Level of Independence: Independent with assistive device(s)         Comments: using RW vs crutches vs cane vs wheelchair just prior to surgery. Needed assistance with socks/shoes even PTA.     Hand Dominance        Extremity/Trunk Assessment   Upper Extremity Assessment: Defer to OT evaluation           Lower Extremity Assessment: RLE deficits/detail RLE Deficits / Details: moves ankle well. R hip flex 2/5.     Cervical / Trunk Assessment: Normal  Communication   Communication: No difficulties  Cognition Arousal/Alertness: Awake/alert Behavior During Therapy: WFL for tasks assessed/performed Overall Cognitive Status: Within Functional Limits for tasks assessed                      General Comments      Exercises        Assessment/Plan    PT  Assessment Patient needs continued PT services  PT Diagnosis Difficulty walking;Acute pain;Abnormality of gait   PT Problem List Decreased strength;Decreased range of motion;Decreased activity tolerance;Decreased balance;Decreased mobility;Decreased knowledge of use of DME;Pain  PT Treatment Interventions DME instruction;Gait training;Functional mobility training;Therapeutic activities;Patient/family education;Balance training;Therapeutic exercise   PT Goals (Current goals can be  found in the Care Plan section) Acute Rehab PT Goals Patient Stated Goal: regain independence PT Goal Formulation: With patient Time For Goal Achievement: 05/10/16 Potential to Achieve Goals: Good    Frequency 7X/week   Barriers to discharge        Co-evaluation   Reason for Co-Treatment: For patient/therapist safety PT goals addressed during session: Mobility/safety with mobility OT goals addressed during session: ADL's and self-care       End of Session   Activity Tolerance: Patient tolerated treatment well Patient left: in chair;with call bell/phone within reach           Time: 0930-0953 PT Time Calculation (min) (ACUTE ONLY): 23 min   Charges:   PT Evaluation $PT Eval Low Complexity: 1 Procedure     PT G Codes:        Rebeca Alert, MPT Pager: [phone]

## 2016-04-27 LAB — BASIC METABOLIC PANEL
ANION GAP: 6 (ref 5–15)
BUN: 17 mg/dL (ref 6–20)
CALCIUM: 9.1 mg/dL (ref 8.9–10.3)
CO2: 27 mmol/L (ref 22–32)
CREATININE: 0.87 mg/dL (ref 0.61–1.24)
Chloride: 106 mmol/L (ref 101–111)
GFR calc Af Amer: >60 mL/min (ref >60)
GFR calc non Af Amer: >60 mL/min (ref >60)
GLUCOSE: 140 mg/dL — AB (ref 65–99)
Potassium: 4.6 mmol/L (ref 3.5–5.1)
Sodium: 139 mmol/L (ref 135–145)

## 2016-04-27 LAB — CBC
HEMATOCRIT: 36.6 % — AB (ref 39.0–52.0)
Hemoglobin: 11.8 g/dL — ABNORMAL LOW (ref 13.0–17.0)
MCH: 28.5 pg (ref 26.0–34.0)
MCHC: 32.2 g/dL (ref 30.0–36.0)
MCV: 88.4 fL (ref 78.0–100.0)
Platelets: 477 10*3/uL — ABNORMAL HIGH (ref 150–400)
RBC: 4.14 MIL/uL — ABNORMAL LOW (ref 4.22–5.81)
RDW: 14.7 % (ref 11.5–15.5)
WBC: 14.1 10*3/uL — ABNORMAL HIGH (ref 4.0–10.5)

## 2016-04-27 MED ORDER — TRAMADOL HCL 50 MG PO TABS: 50.0000 mg | ORAL_TABLET | Freq: Four times a day (QID) | ORAL | Status: DC | PRN

## 2016-04-27 MED ORDER — SODIUM CHLORIDE 0.9% FLUSH: 10.0000 mL | Freq: Two times a day (BID) | INTRAVENOUS | Status: DC

## 2016-04-27 MED ORDER — ASPIRIN 325 MG PO TBEC: 325.0000 mg | DELAYED_RELEASE_TABLET | Freq: Every day | ORAL | Status: DC

## 2016-04-27 MED ORDER — SODIUM CHLORIDE 0.9% FLUSH
10.0000 mL | INTRAVENOUS | Status: DC | PRN
Start: 2016-04-27 — End: 2016-04-29
  Administered 2016-04-28: 10 mL
  Filled 2016-04-27: qty 40

## 2016-04-27 MED ORDER — METHOCARBAMOL 500 MG PO TABS: 500.0000 mg | ORAL_TABLET | Freq: Four times a day (QID) | ORAL | Status: DC | PRN

## 2016-04-27 MED ORDER — OXYCODONE HCL 5 MG PO TABS: 5.0000 mg | ORAL_TABLET | ORAL | Status: DC | PRN

## 2016-04-27 NOTE — Progress Notes (Signed)
Spoke with patient at bedside, discussed plan for d/c in am with [facility]. Continue to await final orders and scripts. Contacted Renee at [facility] [phone]), she requested I send what I have now and rest later. Faxed demographics, progress notes, H&P, Op note, and PICC note to her at [phone]. Will continue to await final orders and fax once clarified. Patient agrees to [facility] PT as well, states surgeon thinks he will on need a couple of visits but does want HH PT, patient would like to use [facility] for these services. Contacted [facility] for referral.

## 2016-04-27 NOTE — Progress Notes (Signed)
PT Cancellation Note  Patient Details Name: [name] MRN: [id number] DOB: [birth year]   Cancelled Treatment:    Reason Eval/Treat Not Completed: Attempted PT tx session-pt politely declined participation at this time. Pt requested PT check back on tomorrow.    Rebeca Alert, MPT Pager: [phone]

## 2016-04-27 NOTE — Progress Notes (Signed)
Pt called secretary due to pump beeping, noted pump with low battery and needed to be plugged up. Pt does not like RLE ace wrap. RLE reinforced and rewrapped per pt request. Pt has no C/O pain at this time. Wife called to check on patient condition and voiced concerns for patients comfort. Follow up done and pt verbalized no discomfort at this time.  Will follow up with NT and continue hourly rounds on patient.

## 2016-04-27 NOTE — Progress Notes (Signed)
Peripherally Inserted Central Catheter/Midline Placement  The IV Nurse has discussed with the patient and/or persons authorized to consent for the patient, the purpose of this procedure and the potential benefits and risks involved with this procedure.  The benefits include less needle sticks, lab draws from the catheter and patient may be discharged home with the catheter.  Risks include, but not limited to, infection, bleeding, blood clot (thrombus formation), and puncture of an artery; nerve damage and irregular heat beat.  Alternatives to this procedure were also discussed.  PICC/Midline Placement Documentation  PICC Single Lumen 04/27/16 PICC Right Basilic 44 cm 0 cm (Active)  Indication for Insertion or Continuance of Line Home intravenous therapies (PICC only) 04/27/2016 12:01 PM  Exposed Catheter (cm) 0 cm 04/27/2016 12:01 PM  Site Assessment Clean;Dry;Intact 04/27/2016 12:01 PM  Line Status Flushed 04/27/2016 12:01 PM  Dressing Type Transparent 04/27/2016 12:01 PM  Dressing Status Clean;Dry;Intact 04/27/2016 12:01 PM  Dressing Change Due 05/04/16 04/27/2016 12:01 PM       Ethelda Chick 04/27/2016, 12:02 PM

## 2016-04-27 NOTE — Progress Notes (Signed)
INFECTIOUS DISEASE PROGRESS NOTE  ID: [name] is a 55 y.o. male with  Principal Problem:   Infection of prosthetic right knee joint (HCC) Active Problems:   Septic arthritis of knee, right (HCC)  Subjective: Getting PIC. No complaints.   Abtx:  Anti-infectives    Start     Dose/Rate Route Frequency Ordered Stop   04/26/16 1600  rifampin (RIFADIN) capsule 300 mg     300 mg Oral Daily 04/26/16 1441     04/26/16 1600  DAPTOmycin (CUBICIN) 842 mg in sodium chloride 0.9 % IVPB     6 mg/kg  140.3 kg 233.7 mL/hr over 30 Minutes Intravenous Every 24 hours 04/26/16 1531     04/26/16 0600  clindamycin (CLEOCIN) IVPB 900 mg     900 mg 100 mL/hr over 30 Minutes Intravenous On call to O.R. 04/25/16 1417 04/25/16 1601   04/25/16 2200  clindamycin (CLEOCIN) IVPB 600 mg     600 mg 100 mL/hr over 30 Minutes Intravenous Every 6 hours 04/25/16 1845 04/26/16 0625   04/25/16 1641  gentamicin (GARAMYCIN) injection  Status:  Discontinued       As needed 04/25/16 1642 04/25/16 1727      Medications:  Scheduled: . aspirin EC  325 mg Oral Q breakfast  . DAPTOmycin (CUBICIN)  IV  6 mg/kg Intravenous Q24H  . diltiazem  240 mg Oral Daily  . docusate sodium  100 mg Oral BID  . folic acid  1 mg Oral Daily  . furosemide  40 mg Oral Daily  . losartan  100 mg Oral Daily  . metoprolol succinate  50 mg Oral Daily  . pantoprazole  40 mg Oral Daily  . rifampin  300 mg Oral Daily    Objective: Vital signs in last 24 hours: Temp:  [97.3 F (36.3 C)-98.3 F (36.8 C)] 97.3 F (36.3 C) (07/14 0443) Pulse Rate:  [53-69] 53 (07/14 0443) Resp:  [16-17] 16 (07/14 0443) BP: (137-153)/(69-75) 151/75 mmHg (07/14 0443) SpO2:  [98 %-99 %] 99 % (07/14 0443)   General appearance: no distress  Lab Results  Recent Labs  04/26/16 0401 04/27/16 0420  WBC 9.8 14.1*  HGB 11.6* 11.8*  HCT 36.1* 36.6*  NA 136 139  K 5.2* 4.6  CL 104 106  CO2 26 27  BUN 17 17  CREATININE 0.93 0.87   Liver  Panel  Recent Labs  04/25/16 1455  PROT 7.8  ALBUMIN 3.3*  AST 16  ALT 15*  ALKPHOS 58  BILITOT 0.6   Sedimentation Rate No results for input(s): ESRSEDRATE in the last 72 hours. C-Reactive Protein No results for input(s): CRP in the last 72 hours.  Microbiology: Recent Results (from the past 240 hour(s))  Body fluid culture     Status: None   Collection Time: 04/17/16  3:50 PM  Result Value Ref Range Status   Specimen Description SYNOVIAL FLUID KNEE  Final   Special Requests FLUID ON SWAB  Final   Gram Stain   Final    ABUNDANT WBC PRESENT, PREDOMINANTLY PMN NO ORGANISMS SEEN    Culture   Final    RARE STAPHYLOCOCCUS LUGDUNENSIS CRITICAL RESULT CALLED TO, READ BACK BY AND VERIFIED WITH: RN Francesco Runner [id number] @1230  MLM Performed at [facility]    Report Status 04/21/2016 FINAL  Final   Organism ID, Bacteria STAPHYLOCOCCUS LUGDUNENSIS  Final      Susceptibility   Staphylococcus lugdunensis - MIC*    CIPROFLOXACIN <=0.5 SENSITIVE Sensitive  ERYTHROMYCIN >=8 RESISTANT Resistant     GENTAMICIN <=0.5 SENSITIVE Sensitive     OXACILLIN 1 SENSITIVE Sensitive     TETRACYCLINE >=16 RESISTANT Resistant     VANCOMYCIN <=0.5 SENSITIVE Sensitive     TRIMETH/SULFA <=10 SENSITIVE Sensitive     CLINDAMYCIN >=8 RESISTANT Resistant     RIFAMPIN <=0.5 SENSITIVE Sensitive     Inducible Clindamycin NEGATIVE Sensitive     * RARE STAPHYLOCOCCUS LUGDUNENSIS  Anaerobic culture     Status: None (Preliminary result)   Collection Time: 04/25/16  4:00 PM  Result Value Ref Range Status   Specimen Description SYNOVIAL FLUID RIGHT KNEE  Final   Special Requests NONE  Final   Gram Stain   Final    FEW WBC PRESENT,BOTH PMN AND MONONUCLEAR NO ORGANISMS SEEN Performed at [facility]    Culture   Final    NO ANAEROBES ISOLATED; CULTURE IN PROGRESS FOR 5 DAYS   Report Status PENDING  Incomplete  Body fluid culture     Status: None (Preliminary result)   Collection Time:  04/25/16  4:00 PM  Result Value Ref Range Status   Specimen Description SYNOVIAL FLUID RIGHT KNEE  Final   Special Requests NONE  Final   Gram Stain   Final    FEW WBC PRESENT,BOTH PMN AND MONONUCLEAR NO ORGANISMS SEEN    Culture   Final    CULTURE REINCUBATED FOR BETTER GROWTH Performed at [facility]    Report Status PENDING  Incomplete    Studies/Results: No results found.   Assessment/Plan: Septic prosthetic joint (staph lugdenensis) Continue cubicin and rifampin Ck 42. Will need weekly after d/c.  LFTs not elevated, will need weekly after d/c.   PIC being placed.   RA On steroids  HTN Total days of antibiotics: 1 dapto-rifampin         Johny Sax Infectious Diseases (pager) [phone] [email or web address] 04/27/2016, 11:19 AM  LOS: 2 days

## 2016-04-27 NOTE — Progress Notes (Signed)
Physical Therapy Treatment Patient Details Name: [name] MRN: [id number] DOB: 1961-07-26 Today's Date: 04/27/2016    History of Present Illness 55 yo male s/p R knee I&D, poly exchange 04/25/16. Hx of RA, multiple surgeries R knee.     PT Comments    Progressing well with mobility.   Follow Up Recommendations  Home health PT     Equipment Recommendations  None recommended by PT    Recommendations for Other Services       Precautions / Restrictions Precautions Precautions: Knee;Fall Required Braces or Orthoses: Knee Immobilizer - Right Knee Immobilizer - Right: Discontinue once straight leg raise with < 10 degree lag Restrictions Weight Bearing Restrictions: No RLE Weight Bearing: Weight bearing as tolerated    Mobility  Bed Mobility Overal bed mobility: Needs Assistance Bed Mobility: Supine to Sit     Supine to sit: Min assist;HOB elevated     General bed mobility comments: Assist for R LE  Transfers Overall transfer level: Needs assistance Equipment used: Rolling walker (2 wheeled) Transfers: Sit to/from Stand Sit to Stand: Min guard         General transfer comment: close guard for safety. VCs safety, hand/LE placement.   Ambulation/Gait Ambulation/Gait assistance: Min guard Ambulation Distance (Feet): 115 Feet Assistive device: Rolling walker (2 wheeled) Gait Pattern/deviations: Step-to pattern     General Gait Details: close guard for safety.    Stairs            Wheelchair Mobility    Modified Rankin (Stroke Patients Only)       Balance                                    Cognition Arousal/Alertness: Awake/alert Behavior During Therapy: WFL for tasks assessed/performed Overall Cognitive Status: Within Functional Limits for tasks assessed                      Exercises Total Joint Exercises Ankle Circles/Pumps: AROM;Both;10 reps;Supine Quad Sets: AROM;Both;10 reps;Supine Heel Slides:  AAROM;Right;10 reps;Supine Hip ABduction/ADduction: AAROM;Right;10 reps;Supine Straight Leg Raises: AAROM;Right;10 reps;Supine (ext lag noted)    General Comments        Pertinent Vitals/Pain Pain Assessment: 0-10 Pain Score: 4  Pain Location: R knee Pain Descriptors / Indicators: Sore;Aching Pain Intervention(s): Monitored during session;Ice applied;Repositioned    Home Living                      Prior Function            PT Goals (current goals can now be found in the care plan section) Progress towards PT goals: Progressing toward goals    Frequency  7X/week    PT Plan Current plan remains appropriate    Co-evaluation             End of Session   Activity Tolerance: Patient tolerated treatment well Patient left: in chair;with call bell/phone within reach     Time: 0926-0953 PT Time Calculation (min) (ACUTE ONLY): 27 min  Charges:  $Gait Training: 8-22 mins $Therapeutic Exercise: 8-22 mins                    G Codes:      Rebeca Alert, MPT Pager: [phone]

## 2016-04-27 NOTE — Progress Notes (Addendum)
Received a call from [facility], they are unable to provide [facility] services to patient. Contacted patient and he chose [place]. Contacted Tim with [facility] is out of network with them. Contacted the patient and he is ok with any agency that will be able to serve him. Contacted [facility] and they don't serve the area where he lives. Contacted [facility] and they do not service his area. Contacted [facility], they have information from [facility]. They can provide PT services. PT order faxed to them.

## 2016-04-27 NOTE — Discharge Instructions (Signed)
° °Dr. Frank Aluisio °Total Joint Specialist °[facility] °[address] °[place], [place] [address] °[phone] ° °TOTAL KNEE REPLACEMENT POSTOPERATIVE DIRECTIONS ° °Knee Rehabilitation, Guidelines Following Surgery  °Results after knee surgery are often greatly improved when you follow the exercise, range of motion and muscle strengthening exercises prescribed by your doctor. Safety measures are also important to protect the knee from further injury. Any time any of these exercises cause you to have increased pain or swelling in your knee joint, decrease the amount until you are comfortable again and slowly increase them. If you have problems or questions, call your caregiver or physical therapist for advice.  ° °HOME CARE INSTRUCTIONS  °Remove items at home which could result in a fall. This includes throw rugs or furniture in walking pathways.  °· ICE to the affected knee every three hours for 30 minutes at a time and then as needed for pain and swelling.  Continue to use ice on the knee for pain and swelling from surgery. You may notice swelling that will progress down to the foot and ankle.  This is normal after surgery.  Elevate the leg when you are not up walking on it.   °· Continue to use the breathing machine which will help keep your temperature down.  It is common for your temperature to cycle up and down following surgery, especially at night when you are not up moving around and exerting yourself.  The breathing machine keeps your lungs expanded and your temperature down. °· Do not place pillow under knee, focus on keeping the knee straight while resting ° °DIET °You may resume your previous home diet once your are discharged from the hospital. ° °DRESSING / WOUND CARE / SHOWERING °You may shower 3 days after surgery, but keep the wounds dry during showering.  You may use an occlusive plastic wrap (Press'n Seal for example), NO SOAKING/SUBMERGING IN THE BATHTUB.  If the  bandage gets wet, change with a clean dry gauze.  If the incision gets wet, pat the wound dry with a clean towel. °You may start showering once you are discharged home but do not submerge the incision under water. Just pat the incision dry and apply a dry gauze dressing on daily. °Change the surgical dressing daily and reapply a dry dressing each time. ° °ACTIVITY °Walk with your walker as instructed. °Use walker as long as suggested by your caregivers. °Avoid periods of inactivity such as sitting longer than an hour when not asleep. This helps prevent blood clots.  °You may resume a sexual relationship in one month or when given the OK by your doctor.  °You may return to work once you are cleared by your doctor.  °Do not drive a car for 6 weeks or until released by you surgeon.  °Do not drive while taking narcotics. ° °WEIGHT BEARING °Weight bearing as tolerated with assist device (walker, cane, etc) as directed, use it as long as suggested by your surgeon or therapist, typically at least 4-6 weeks. ° °POSTOPERATIVE CONSTIPATION PROTOCOL °Constipation - defined medically as fewer than three stools per week and severe constipation as less than one stool per week. ° °One of the most common issues patients have following surgery is constipation.  Even if you have a regular bowel pattern at home, your normal regimen is likely to be disrupted due to multiple reasons following surgery.  Combination of anesthesia, postoperative narcotics, change in appetite and fluid intake all can affect your bowels.    In order to avoid complications following surgery, here are some recommendations in order to help you during your recovery period. ° °Colace (docusate) - Pick up an over-the-counter form of Colace or another stool softener and take twice a day as long as you are requiring postoperative pain medications.  Take with a full glass of water daily.  If you experience loose stools or diarrhea, hold the colace until you stool forms  back up.  If your symptoms do not get better within 1 week or if they get worse, check with your doctor. ° °Dulcolax (bisacodyl) - Pick up over-the-counter and take as directed by the product packaging as needed to assist with the movement of your bowels.  Take with a full glass of water.  Use this product as needed if not relieved by Colace only.  ° °MiraLax (polyethylene glycol) - Pick up over-the-counter to have on hand.  MiraLax is a solution that will increase the amount of water in your bowels to assist with bowel movements.  Take as directed and can mix with a glass of water, juice, soda, coffee, or tea.  Take if you go more than two days without a movement. °Do not use MiraLax more than once per day. Call your doctor if you are still constipated or irregular after using this medication for 7 days in a row. ° °If you continue to have problems with postoperative constipation, please contact the office for further assistance and recommendations.  If you experience "the worst abdominal pain ever" or develop nausea or vomiting, please contact the office immediatly for further recommendations for treatment. ° °ITCHING ° If you experience itching with your medications, try taking only a single pain pill, or even half a pain pill at a time.  You can also use Benadryl over the counter for itching or also to help with sleep.  ° °TED HOSE STOCKINGS °Wear the elastic stockings on both legs for three weeks following surgery during the day but you may remove then at night for sleeping. ° °MEDICATIONS °See your medication summary on the “After Visit Summary” that the nursing staff will review with you prior to discharge.  You may have some home medications which will be placed on hold until you complete the course of blood thinner medication.  It is important for you to complete the blood thinner medication as prescribed by your surgeon.  Continue your approved medications as instructed at time of  discharge. ° °PRECAUTIONS °If you experience chest pain or shortness of breath - call 911 immediately for transfer to the hospital emergency department.  °If you develop a fever greater that 101 F, purulent drainage from wound, increased redness or drainage from wound, foul odor from the wound/dressing, or calf pain - CONTACT YOUR SURGEON.   °                                                °FOLLOW-UP APPOINTMENTS °Make sure you keep all of your appointments after your operation with your surgeon and caregivers. You should call the office at the above phone number and make an appointment for approximately two weeks after the date of your surgery or on the date instructed by your surgeon outlined in the "After Visit Summary". ° ° °RANGE OF MOTION AND STRENGTHENING EXERCISES  °Rehabilitation of the knee is important following a knee injury or   an operation. After just a few days of immobilization, the muscles of the thigh which control the knee become weakened and shrink (atrophy). Knee exercises are designed to build up the tone and strength of the thigh muscles and to improve knee motion. Often times heat used for twenty to thirty minutes before working out will loosen up your tissues and help with improving the range of motion but do not use heat for the first two weeks following surgery. These exercises can be done on a training (exercise) mat, on the floor, on a table or on a bed. Use what ever works the best and is most comfortable for you Knee exercises include:  Leg Lifts - While your knee is still immobilized in a splint or cast, you can do straight leg raises. Lift the leg to 60 degrees, hold for 3 sec, and slowly lower the leg. Repeat 10-20 times 2-3 times daily. Perform this exercise against resistance later as your knee gets better.  Quad and Hamstring Sets - Tighten up the muscle on the front of the thigh (Quad) and hold for 5-10 sec. Repeat this 10-20 times hourly. Hamstring sets are done by pushing the  foot backward against an object and holding for 5-10 sec. Repeat as with quad sets.   Leg Slides: Lying on your back, slowly slide your foot toward your buttocks, bending your knee up off the floor (only go as far as is comfortable). Then slowly slide your foot back down until your leg is flat on the floor again.  Angel Wings: Lying on your back spread your legs to the side as far apart as you can without causing discomfort.  A rehabilitation program following serious knee injuries can speed recovery and prevent re-injury in the future due to weakened muscles. Contact your doctor or a physical therapist for more information on knee rehabilitation.   IF YOU ARE TRANSFERRED TO A SKILLED REHAB FACILITY If the patient is transferred to a skilled rehab facility following release from the hospital, a list of the current medications will be sent to the facility for the patient to continue.  When discharged from the skilled rehab facility, please have the facility set up the patient's Home Health Physical Therapy prior to being released. Also, the skilled facility will be responsible for providing the patient with their medications at time of release from the facility to include their pain medication, the muscle relaxants, and their blood thinner medication. If the patient is still at the rehab facility at time of the two week follow up appointment, the skilled rehab facility will also need to assist the patient in arranging follow up appointment in our office and any transportation needs.  MAKE SURE YOU:  Understand these instructions.  Get help right away if you are not doing well or get worse.    Pick up stool softner and laxative for home use following surgery while on pain medications. Do not submerge incision under water. Please use good hand washing techniques while changing dressing each day. May shower starting three days after surgery. Please use a clean towel to pat the incision dry following  showers. Continue to use ice for pain and swelling after surgery. Do not use any lotions or creams on the incision until instructed by your surgeon.  Take a full dose 325 mg Aspirin daily for three weeks and then reduce to a baby 81 mg Aspirin for an additional three weeks.   PICC Home Guide   A peripherally inserted central  catheter (PICC) is a long, thin, flexible tube that is inserted into a vein in the upper arm. It is a form of intravenous (IV) access. It is considered to be a "central" line because the tip of the PICC ends in a large vein in your chest. This large vein is called the superior vena cava (SVC). The PICC tip ends in the SVC because there is a lot of blood flow in the SVC. This allows medicines and IV fluids to be quickly distributed throughout the body. The PICC is inserted using a sterile technique by a specially trained nurse or physician. After the PICC is inserted, a chest X-ray exam is done to be sure it is in the correct place.  A PICC may be placed for different reasons, such as:  To give medicines and liquid nutrition that can only be given through a central line. Examples are:  Certain antibiotic treatments.  Chemotherapy.  Total parenteral nutrition (TPN). To take frequent blood samples.  To give IV fluids and blood products.  If there is difficulty placing a peripheral intravenous (PIV) catheter. If taken care of properly, a PICC can remain in place for several months. A PICC can also allow a person to go home from the hospital early. Medicine and PICC care can be managed at home by a family member or home health care team.  WHAT PROBLEMS CAN HAPPEN WHEN I HAVE A PICC?  Problems with a PICC can occasionally occur. These may include the following:  A blood clot (thrombus) forming in or at the tip of the PICC. This can cause the PICC to become clogged. A clot-dissolving medicine called tissue plasminogen activator (tPA) can be given through the PICC to help break up  the clot.  Inflammation of the vein (phlebitis) in which the PICC is placed. Signs of inflammation may include redness, pain at the insertion site, red streaks, or being able to feel a "cord" in the vein where the PICC is located.  Infection in the PICC or at the insertion site. Signs of infection may include fever, chills, redness, swelling, or pus drainage from the PICC insertion site.  PICC movement (malposition). The PICC tip may move from its original position due to excessive physical activity, forceful coughing, sneezing, or vomiting.  A break or cut in the PICC. It is important to not use scissors near the PICC.  Nerve or tendon irritation or injury during PICC insertion. WHAT SHOULD I KEEP IN MIND ABOUT ACTIVITIES WHEN I HAVE A PICC?  You may bend your arm and move it freely. If your PICC is near or at the bend of your elbow, avoid activity with repeated motion at the elbow.  Rest at home for the remainder of the day following PICC line insertion.  Avoid lifting heavy objects as instructed by your health care provider.  Avoid using a crutch with the arm on the same side as your PICC. You may need to use a walker. WHAT SHOULD I KNOW ABOUT MY PICC DRESSING?  Keep your PICC bandage (dressing) clean and dry to prevent infection.  Ask your health care provider when you may shower. Ask your health care provider to teach you how to wrap the PICC when you do take a shower. Change the PICC dressing as instructed by your health care provider.  Change your PICC dressing if it becomes loose or wet. WHAT SHOULD I KNOW ABOUT PICC CARE?  Check the PICC insertion site daily for leakage, redness, swelling, or pain.  Do not take a bath, swim, or use hot tubs when you have a PICC. Cover PICC line with clear plastic wrap and tape to keep it dry while showering.  Flush the PICC as directed by your health care provider. Let your health care provider know right away if the PICC is difficult to flush or does not  flush. Do not use force to flush the PICC.  Do not use a syringe that is less than 10 mL to flush the PICC.  Never pull or tug on the PICC.  Avoid blood pressure checks on the arm with the PICC.  Keep your PICC identification card with you at all times.  Do not take the PICC out yourself. Only a trained clinical professional should remove the PICC. SEEK IMMEDIATE MEDICAL CARE IF:  Your PICC is accidentally pulled all the way out. If this happens, cover the insertion site with a bandage or gauze dressing. Do not throw the PICC away. Your health care provider will need to inspect it.  Your PICC was tugged or pulled and has partially come out. Do not push the PICC back in.  There is any type of drainage, redness, or swelling where the PICC enters the skin.  You cannot flush the PICC, it is difficult to flush, or the PICC leaks around the insertion site when it is flushed.  You hear a "flushing" sound when the PICC is flushed.  You have pain, discomfort, or numbness in your arm, shoulder, or jaw on the same side as the PICC.  You feel your heart "racing" or skipping beats.  You notice a hole or tear in the PICC.  You develop chills or a fever. MAKE SURE YOU:  Understand these instructions.  Will watch your condition.  Will get help right away if you are not doing well or get worse. This information is not intended to replace advice given to you by your health care provider. Make sure you discuss any questions you have with your health care provider.  Document Released: 04/07/2003 Document Revised: 10/22/2014 Document Reviewed: 06/08/2013  Elsevier Interactive Patient Education [employer]

## 2016-04-27 NOTE — Progress Notes (Signed)
Occupational Therapy Treatment Patient Details Name: DIYAN DAVE MRN: [id number] DOB: Oct 29, 1960 Today's Date: 04/27/2016    History of present illness 55 yo male s/p R knee I&D, poly exchange 04/25/16. Hx of RA, multiple surgeries R knee.    OT comments  All OT education completed and pt questions answered. No further OT needs at this time. Will sign off.  Follow Up Recommendations  No OT follow up;Supervision/Assistance - 24 hour    Equipment Recommendations  None recommended by OT    Recommendations for Other Services      Precautions / Restrictions Precautions Precautions: Knee;Fall Required Braces or Orthoses: Knee Immobilizer - Right Knee Immobilizer - Right: Discontinue once straight leg raise with < 10 degree lag Restrictions Weight Bearing Restrictions: No RLE Weight Bearing: Weight bearing as tolerated       Mobility Bed Mobility Overal bed mobility: Needs Assistance Bed Mobility: Sit to Supine      Sit to supine: Min assist   General bed mobility comments: Assist for R LE  Transfers Overall transfer level: Needs assistance Equipment used: Rolling walker (2 wheeled) Transfers: Sit to/from [facility] Sit to Stand: Min guard Stand pivot transfers: Min guard       General transfer comment: for safety    Balance                                   ADL Overall ADL's : Needs assistance/impaired                                       General ADL Comments: Attempted to work on toilet/shower transfers today. Patient states he has been through several surgeries, has all necessary DME, and does not feel like he needs to practice the ADL transfers. Patient also reports he has assistance at d/c for LB self-care. No further OT needs identified. RN asked for patient to transfer back to bed in preparation for PICC line placement. Assisted patient with that transfer.      Vision                      Perception     Praxis      Cognition   Behavior During Therapy: WFL for tasks assessed/performed Overall Cognitive Status: Within Functional Limits for tasks assessed                       Extremity/Trunk Assessment               Exercises    Shoulder Instructions       General Comments      Pertinent Vitals/ Pain       Pain Assessment: No/denies pain Pain Score: 4  Pain Location: R knee Pain Descriptors / Indicators: Sore;Aching Pain Intervention(s): Monitored during session;Ice applied;Repositioned  Home Living                                          Prior Functioning/Environment              Frequency       Progress Toward Goals  OT Goals(current goals can now be found in the care plan section)  Progress towards  OT goals: Goals met/education completed, patient discharged from OT  Acute Rehab OT Goals Patient Stated Goal: home tomorrow OT Goal Formulation: All assessment and education complete, DC therapy  Plan All goals met and education completed, patient discharged from OT services    Co-evaluation                 End of Session Equipment Utilized During Treatment: Rolling walker;Right knee immobilizer   Activity Tolerance Patient tolerated treatment well   Patient Left in bed;with call bell/phone within reach   Nurse Communication Mobility status        Time: 0681-6619 OT Time Calculation (min): 15 min  Charges: OT General Charges $OT Visit: 1 Procedure OT Treatments $Therapeutic Activity: 8-22 mins  Nimah Uphoff A 04/27/2016, 11:05 AM

## 2016-04-27 NOTE — Progress Notes (Addendum)
   Subjective: 2 Days Post-Op Procedure(s) (LRB): RIGHT KNEE IRRIGATION AND DEBRIDEMENT WITH POLY EXCHANGE (Right) Patient reports pain as mild.   Patient seen in rounds with Dr. Lequita Halt.  Doing okay this morning. Patient is well, but has had some minor complaints of pain in the knee, requiring pain medications Plan is to go Home after hospital stay.  Greatly appreciate ID Consult.  Started on Cubicin and Rifampin. PICC ordered but was not able to get yesterday.  Hopefully they will be able to place PICC today in anticipation of discharge tomorrow.  Objective: Vital signs in last 24 hours: Temp:  [97.3 F (36.3 C)-98.3 F (36.8 C)] 97.3 F (36.3 C) (07/14 0443) Pulse Rate:  [53-69] 53 (07/14 0443) Resp:  [16-18] 16 (07/14 0443) BP: (137-153)/(69-76) 151/75 mmHg (07/14 0443) SpO2:  [98 %-100 %] 99 % (07/14 0443)  Intake/Output from previous day:  Intake/Output Summary (Last 24 hours) at 04/27/16 0728 Last data filed at 04/27/16 0444  Gross per 24 hour  Intake 1318.67 ml  Output   2075 ml  Net -756.33 ml    Labs:  Recent Labs  04/25/16 1455 04/26/16 0401 04/27/16 0420  HGB 12.0* 11.6* 11.8*    Recent Labs  04/26/16 0401 04/27/16 0420  WBC 9.8 14.1*  RBC 4.07* 4.14*  HCT 36.1* 36.6*  PLT 445* 477*    Recent Labs  04/26/16 0401 04/27/16 0420  NA 136 139  K 5.2* 4.6  CL 104 106  CO2 26 27  BUN 17 17  CREATININE 0.93 0.87  GLUCOSE 147* 140*  CALCIUM 8.9 9.1    Recent Labs  04/25/16 1455  INR 1.24    EXAM General - Patient is Alert, Appropriate and Oriented Extremity - Neurovascular intact Sensation intact distally Dorsiflexion/Plantar flexion intact No cellulitis present Dressing/Incision - clean, dry, no drainage, staples intact Motor Function - intact, moving foot and toes well on exam.   Past Medical History  Diagnosis Date  . Asthma   . GERD (gastroesophageal reflux disease)   . Hypertension   . Rheumatoid arthritis (HCC) dx'd ~ 1977   . Cellulitis of lower extremity     "usually RLL; this time it's got up to my lower abdoment" (02/11/2014)-series of antibiotics completed 02-28-14  . S/P PICC central line placement 03-16-14    right upper arm remains intact.05-11-14 removed 1 week ago.  . Diverticulosis   . PONV (postoperative nausea and vomiting)   . OSA (obstructive sleep apnea)     "quit wearing mask several years ago" (02/11/2014)    Assessment/Plan: 2 Days Post-Op Procedure(s) (LRB): RIGHT KNEE IRRIGATION AND DEBRIDEMENT WITH POLY EXCHANGE (Right) Principal Problem:   Infection of prosthetic right knee joint (HCC) Active Problems:   Septic arthritis of knee, right (HCC)  Estimated body mass index is 47.04 kg/(m^2) as calculated from the following:   Height as of this encounter: 5\' 8"  (1.727 m).   Weight as of this encounter: 140.303 kg (309 lb 5 oz). Up with therapy Plan for discharge tomorrow Discharge home with home health  DVT Prophylaxis - Aspirin 325 mg Weight-Bearing as tolerated to right leg PICC ordered IV antibiotics Plan home tomorrow  Will tentatively setup discharge for tomorrow but will rely on ID for recommendations of the ABX, dosage, duration of treatment (have not printed out those RXs yet).   , PA-C Orthopaedic Surgery 04/27/2016, 7:28 AM

## 2016-04-28 LAB — CBC
HCT: 36.1 % — ABNORMAL LOW (ref 39.0–52.0)
HEMOGLOBIN: 11.4 g/dL — AB (ref 13.0–17.0)
MCH: 28.1 pg (ref 26.0–34.0)
MCHC: 31.6 g/dL (ref 30.0–36.0)
MCV: 89.1 fL (ref 78.0–100.0)
PLATELETS: 496 10*3/uL — AB (ref 150–400)
RBC: 4.05 MIL/uL — AB (ref 4.22–5.81)
RDW: 15.1 % (ref 11.5–15.5)
WBC: 7.7 10*3/uL (ref 4.0–10.5)

## 2016-04-28 MED ORDER — RIFAMPIN 300 MG PO CAPS: 300.0000 mg | ORAL_CAPSULE | Freq: Every day | ORAL | Status: DC

## 2016-04-28 MED ORDER — SODIUM CHLORIDE 0.9 % IV SOLN: 6.0000 mg/kg | INTRAVENOUS | Status: DC

## 2016-04-28 NOTE — Progress Notes (Signed)
Physical Therapy Treatment Patient Details Name: [name] MRN: [id number] DOB: 04/17/1961 Today's Date: 04/28/2016    History of Present Illness 55 yo male s/p R knee I&D, poly exchange 04/25/16. Hx of RA, multiple surgeries R knee.     PT Comments    Pt progressing well; declined to practice stairs stating he feels confident he will be able to do it now that he is WBAT;   Follow Up Recommendations  Home health PT     Equipment Recommendations  None recommended by PT    Recommendations for Other Services       Precautions / Restrictions Precautions Precautions: Knee;Fall Required Braces or Orthoses: Knee Immobilizer - Right Knee Immobilizer - Right: Discontinue once straight leg raise with < 10 degree lag Restrictions Weight Bearing Restrictions: No RLE Weight Bearing: Weight bearing as tolerated Other Position/Activity Restrictions: WBAT    Mobility  Bed Mobility Overal bed mobility: Needs Assistance Bed Mobility: Supine to Sit     Supine to sit: Min assist     General bed mobility comments: Assist for R LE  Transfers Overall transfer level: Needs assistance Equipment used: Rolling walker (2 wheeled) Transfers: Sit to/from Stand Sit to Stand: Supervision         General transfer comment: for safety  Ambulation/Gait Ambulation/Gait assistance: Supervision Ambulation Distance (Feet): 140 Feet Assistive device: Rolling walker (2 wheeled) Gait Pattern/deviations: Step-to pattern;Step-through pattern;Decreased stance time - right     General Gait Details: supervision for safety, cues for equal wt shift/WBing RLE   Stairs            Wheelchair Mobility    Modified Rankin (Stroke Patients Only)       Balance                                    Cognition Arousal/Alertness: Awake/alert Behavior During Therapy: WFL for tasks assessed/performed Overall Cognitive Status: Within Functional Limits for tasks assessed                       Exercises      General Comments        Pertinent Vitals/Pain Pain Assessment: 0-10 Pain Score: 2  Pain Location: right knee Pain Descriptors / Indicators: Sore Pain Intervention(s): Limited activity within patient's tolerance;Monitored during session;Premedicated before session    Home Living                      Prior Function            PT Goals (current goals can now be found in the care plan section) Acute Rehab PT Goals Patient Stated Goal: home, no further infections PT Goal Formulation: With patient Time For Goal Achievement: 05/10/16 Potential to Achieve Goals: Good Progress towards PT goals: Progressing toward goals    Frequency  7X/week    PT Plan Current plan remains appropriate    Co-evaluation             End of Session Equipment Utilized During Treatment: Right knee immobilizer Activity Tolerance: Patient tolerated treatment well Patient left: in chair;with call bell/phone within reach     Time: 0945-1001 PT Time Calculation (min) (ACUTE ONLY): 16 min  Charges:  $Gait Training: 8-22 mins                    G Codes:  [facility] 04/28/2016, 10:07 AM

## 2016-04-28 NOTE — Progress Notes (Signed)
Pt concerned about home PT. States was contacted by [employer] care representative and told that they may not be able to provide PT. Call and left message for Weekend Case manager explaining situation and requesting follow up

## 2016-04-28 NOTE — Progress Notes (Signed)
Subjective: 3 Days Post-Op Procedure(s) (LRB): RIGHT KNEE IRRIGATION AND DEBRIDEMENT WITH POLY EXCHANGE (Right) Patient reports pain as mild.   Waiting on abx for D/C. Ready to go home today. Pain well controlled.   Objective: Vital signs in last 24 hours: Temp:  [97.6 F (36.4 C)-97.8 F (36.6 C)] 97.8 F (36.6 C) (07/15 0548) Pulse Rate:  [59-62] 59 (07/15 0548) Resp:  [16] 16 (07/15 0548) BP: (117-132)/(59-65) 117/59 mmHg (07/15 0548) SpO2:  [99 %-100 %] 99 % (07/15 0548)  Intake/Output from previous day: 07/14 0701 - 07/15 0700 In: 600 [P.O.:600] Out: 1655 [Urine:1655] Intake/Output this shift:     Recent Labs  04/25/16 1455 04/26/16 0401 04/27/16 0420 04/28/16 0521  HGB 12.0* 11.6* 11.8* 11.4*    Recent Labs  04/27/16 0420 04/28/16 0521  WBC 14.1* 7.7  RBC 4.14* 4.05*  HCT 36.6* 36.1*  PLT 477* 496*    Recent Labs  04/26/16 0401 04/27/16 0420  NA 136 139  K 5.2* 4.6  CL 104 106  CO2 26 27  BUN 17 17  CREATININE 0.93 0.87  GLUCOSE 147* 140*  CALCIUM 8.9 9.1    Recent Labs  04/25/16 1455  INR 1.24    Neurologically intact ABD soft Neurovascular intact Sensation intact distally Intact pulses distally Dorsiflexion/Plantar flexion intact Incision: dressing C/D/I and no drainage No cellulitis present Compartment soft no calf pain or sign of DVT  Assessment/Plan: 3 Days Post-Op Procedure(s) (LRB): RIGHT KNEE IRRIGATION AND DEBRIDEMENT WITH POLY EXCHANGE (Right) Advance diet Up with therapy D/C IV fluids  Abx Rx written per ID's note yesterday Plan D/C home today Seen by myself and Dr. Linna Caprice this AM Discussed D/C instructions  [name]. 04/28/2016, 9:52 AM

## 2016-04-28 NOTE — Progress Notes (Signed)
Critical lab value noted. Infectious disease paged.

## 2016-04-28 NOTE — Progress Notes (Signed)
Pt & wife wanting to be able to discharge today. CM paged 3 times without response. [name], [employer]

## 2016-04-28 NOTE — Progress Notes (Signed)
Spoke with on call MD Dr Ainsley Spinner via phone and communicated critical lab value. Dr Ainsley Spinner verbalizes understanding. States okay to dc pt to home with abx/prescriptions written by orthopedic md.

## 2016-04-29 LAB — BODY FLUID CULTURE

## 2016-04-29 MED ORDER — HEPARIN SOD (PORK) LOCK FLUSH 100 UNIT/ML IV SOLN
250.0000 [IU] | INTRAVENOUS | Status: AC | PRN
  Administered 2016-04-29: 250 [IU]

## 2016-04-29 NOTE — Progress Notes (Signed)
Pt's vitals are WNL, tolerating diet and pain is under control. Discussed discharge instructions with both pt and wife. Discharged to home with prescriptions.

## 2016-04-29 NOTE — Progress Notes (Signed)
   Subjective: 4 Days Post-Op Procedure(s) (LRB): RIGHT KNEE IRRIGATION AND DEBRIDEMENT WITH POLY EXCHANGE (Right)  Pt doing well this morning Minimal pain/soreness in the right knee Ready for d/c home once infusion and therapy setup with home health Patient reports pain as mild.  Objective:   VITALS:   Filed Vitals:   04/28/16 2152 04/29/16 0602  BP: 133/65 126/72  Pulse: 67 67  Temp: 97.9 F (36.6 C) 98.2 F (36.8 C)  Resp: 18 16    Right knee incision healing well nv intact distally No rashes or edema No drainage  LABS  Recent Labs  04/27/16 0420 04/28/16 0521  HGB 11.8* 11.4*  HCT 36.6* 36.1*  WBC 14.1* 7.7  PLT 477* 496*     Recent Labs  04/27/16 0420  NA 139  K 4.6  BUN 17  CREATININE 0.87  GLUCOSE 140*     Assessment/Plan: 4 Days Post-Op Procedure(s) (LRB): RIGHT KNEE IRRIGATION AND DEBRIDEMENT WITH POLY EXCHANGE (Right) D/c home today once home health and infusion services setup F/u in 1-2 weeks with Dr. Lequita Halt Pain management as needed    Alphonsa Overall, MPAS, PA-C  04/29/2016, 7:55 AM

## 2016-04-29 NOTE — Care Management (Signed)
CM spoke with patient at the bedside. Patient informed his prescription for IV antibiotics will be faxed to [facility] today. He and his wife have spoken with [facility] and confirmed a home visit will occur on 04/30/16. HH RN order, discharge summary and prescription faxed to [facility] at [phone]. Patient states there are limited options for HHPT in his area. Reports he has discussed this with his physician and plans for he and his wife to follow-up with agencies tomorrow in an attempt to locate someone who will come to his home. He states [facility] has called and informed him they do not come to his home. Plans to contact a small agency in [facility] tomorrow.  Physicist, medical BSN CCM

## 2016-04-29 NOTE — Progress Notes (Signed)
PT Cancellation Note  Patient Details Name: [name] MRN: [id number] DOB: 11-Dec-1960   Cancelled Treatment:    Reason Eval/Treat Not Completed: Other (comment) (politely declined stating he had been up to bathroom and has been "bending the knee")pt plans to D/C home as soon as his Abx are done for today;   [facility] 04/29/2016, 12:55 PM

## 2016-04-29 NOTE — Discharge Summary (Signed)
Physician Discharge Summary   Patient ID: [name] MRN: [id number] DOB/AGE: June 28, 1961 55 y.o.  Admit date: 04/25/2016 Discharge date: 04/29/2016  Admission Diagnoses:  Principal Problem:   Infection of prosthetic right knee joint (HCC) Active Problems:   Septic arthritis of knee, right [facility])   Discharge Diagnoses:  Same   Surgeries: Procedure(s): RIGHT KNEE IRRIGATION AND DEBRIDEMENT WITH POLY EXCHANGE on 04/25/2016   Consultants: PT/OT  Discharged Condition: Stable  Hospital Course: [name] is an 55 y.o. male who was admitted 04/25/2016 with a chief complaint of right knee pain and swelling, and found to have a diagnosis of Infection of prosthetic right knee joint (HCC).  They were brought to the operating room on 04/25/2016 and underwent the above named procedures.    The patient had an uncomplicated hospital course and was stable for discharge.  Recent vital signs:  Filed Vitals:   04/28/16 2152 04/29/16 0602  BP: 133/65 126/72  Pulse: 67 67  Temp: 97.9 F (36.6 C) 98.2 F (36.8 C)  Resp: 18 16    Recent laboratory studies:  Results for orders placed or performed during the hospital encounter of 04/25/16  Anaerobic culture  Result Value Ref Range   Specimen Description SYNOVIAL FLUID RIGHT KNEE    Special Requests NONE    Gram Stain      FEW WBC PRESENT,BOTH PMN AND MONONUCLEAR NO ORGANISMS SEEN Performed at [facility]    Culture      NO ANAEROBES ISOLATED; CULTURE IN PROGRESS FOR 5 DAYS   Report Status PENDING   Body fluid culture  Result Value Ref Range   Specimen Description SYNOVIAL FLUID RIGHT KNEE    Special Requests NONE    Gram Stain      FEW WBC PRESENT,BOTH PMN AND MONONUCLEAR NO ORGANISMS SEEN    Culture      FEW STAPHYLOCOCCUS SPECIES (COAGULASE NEGATIVE) CRITICAL RESULT CALLED TO, READ BACK BY AND VERIFIED WITH: T COUNCIL,RN AT 1121 04/28/16 BY L BENFIELD    Report Status PENDING    Organism ID, Bacteria STAPHYLOCOCCUS  SPECIES (COAGULASE NEGATIVE)       Susceptibility   Staphylococcus species (coagulase negative) - MIC*    CIPROFLOXACIN <=0.5 INTERMEDIATE Intermediate     ERYTHROMYCIN >=8 RESISTANT Resistant     GENTAMICIN <=0.5 SENSITIVE Sensitive     OXACILLIN 1 SENSITIVE Sensitive     TETRACYCLINE >=16 RESISTANT Resistant     VANCOMYCIN <=0.5 SENSITIVE Sensitive     TRIMETH/SULFA <=10 SENSITIVE Sensitive     CLINDAMYCIN >=8 RESISTANT Resistant     RIFAMPIN <=0.5 SENSITIVE Sensitive     Inducible Clindamycin Value in next row Sensitive      NEGATIVEPerformed at [facility]    * FEW STAPHYLOCOCCUS SPECIES (COAGULASE NEGATIVE)  APTT  Result Value Ref Range   aPTT 30 24 - 37 seconds  CBC  Result Value Ref Range   WBC 10.0 4.0 - 10.5 K/uL   RBC 4.21 (L) 4.22 - 5.81 MIL/uL   Hemoglobin 12.0 (L) 13.0 - 17.0 g/dL   HCT 84.1 (L) 32.4 - 40.1 %   MCV 89.1 78.0 - 100.0 fL   MCH 28.5 26.0 - 34.0 pg   MCHC 32.0 30.0 - 36.0 g/dL   RDW 02.7 25.3 - 66.4 %   Platelets 449 (H) 150 - 400 K/uL  Comprehensive metabolic panel  Result Value Ref Range   Sodium 134 (L) 135 - 145 mmol/L   Potassium 5.0 3.5 - 5.1  mmol/L   Chloride 102 101 - 111 mmol/L   CO2 25 22 - 32 mmol/L   Glucose, Bld 104 (H) 65 - 99 mg/dL   BUN 17 6 - 20 mg/dL   Creatinine, Ser 0.17 0.61 - 1.24 mg/dL   Calcium 8.9 8.9 - 51.0 mg/dL   Total Protein 7.8 6.5 - 8.1 g/dL   Albumin 3.3 (L) 3.5 - 5.0 g/dL   AST 16 15 - 41 U/L   ALT 15 (L) 17 - 63 U/L   Alkaline Phosphatase 58 38 - 126 U/L   Total Bilirubin 0.6 0.3 - 1.2 mg/dL   GFR calc non Af Amer >60 >60 mL/min   GFR calc Af Amer >60 >60 mL/min   Anion gap 7 5 - 15  Protime-INR  Result Value Ref Range   Prothrombin Time 15.2 11.6 - 15.2 seconds   INR 1.24 0.00 - 1.49  CBC  Result Value Ref Range   WBC 9.8 4.0 - 10.5 K/uL   RBC 4.07 (L) 4.22 - 5.81 MIL/uL   Hemoglobin 11.6 (L) 13.0 - 17.0 g/dL   HCT 25.8 (L) 52.7 - 78.2 %   MCV 88.7 78.0 - 100.0 fL   MCH 28.5 26.0 - 34.0 pg    MCHC 32.1 30.0 - 36.0 g/dL   RDW 42.3 53.6 - 14.4 %   Platelets 445 (H) 150 - 400 K/uL  Basic metabolic panel  Result Value Ref Range   Sodium 136 135 - 145 mmol/L   Potassium 5.2 (H) 3.5 - 5.1 mmol/L   Chloride 104 101 - 111 mmol/L   CO2 26 22 - 32 mmol/L   Glucose, Bld 147 (H) 65 - 99 mg/dL   BUN 17 6 - 20 mg/dL   Creatinine, Ser 3.15 0.61 - 1.24 mg/dL   Calcium 8.9 8.9 - 40.0 mg/dL   GFR calc non Af Amer >60 >60 mL/min   GFR calc Af Amer >60 >60 mL/min   Anion gap 6 5 - 15  CK  Result Value Ref Range   Total CK 42 (L) 49 - 397 U/L  CBC  Result Value Ref Range   WBC 14.1 (H) 4.0 - 10.5 K/uL   RBC 4.14 (L) 4.22 - 5.81 MIL/uL   Hemoglobin 11.8 (L) 13.0 - 17.0 g/dL   HCT 86.7 (L) 61.9 - 50.9 %   MCV 88.4 78.0 - 100.0 fL   MCH 28.5 26.0 - 34.0 pg   MCHC 32.2 30.0 - 36.0 g/dL   RDW 32.6 71.2 - 45.8 %   Platelets 477 (H) 150 - 400 K/uL  Basic metabolic panel  Result Value Ref Range   Sodium 139 135 - 145 mmol/L   Potassium 4.6 3.5 - 5.1 mmol/L   Chloride 106 101 - 111 mmol/L   CO2 27 22 - 32 mmol/L   Glucose, Bld 140 (H) 65 - 99 mg/dL   BUN 17 6 - 20 mg/dL   Creatinine, Ser 0.99 0.61 - 1.24 mg/dL   Calcium 9.1 8.9 - 83.3 mg/dL   GFR calc non Af Amer >60 >60 mL/min   GFR calc Af Amer >60 >60 mL/min   Anion gap 6 5 - 15  CBC  Result Value Ref Range   WBC 7.7 4.0 - 10.5 K/uL   RBC 4.05 (L) 4.22 - 5.81 MIL/uL   Hemoglobin 11.4 (L) 13.0 - 17.0 g/dL   HCT 82.5 (L) 05.3 - 97.6 %   MCV 89.1 78.0 - 100.0 fL   MCH  28.1 26.0 - 34.0 pg   MCHC 31.6 30.0 - 36.0 g/dL   RDW 45.4 09.8 - 11.9 %   Platelets 496 (H) 150 - 400 K/uL  Type and screen Order type and screen if day of surgery is less than 15 days from draw of preadmission visit or order morning of surgery if day of surgery is greater than 6 days from preadmission visit.  Result Value Ref Range   ABO/RH(D) O POS    Antibody Screen NEG    Sample Expiration 04/28/2016     Discharge Medications:     Medication List     STOP taking these medications        clindamycin 300 MG capsule  Commonly known as:  CLEOCIN     methotrexate 2.5 MG tablet  Commonly known as:  RHEUMATREX     oxyCODONE-acetaminophen 5-325 MG tablet  Commonly known as:  PERCOCET/ROXICET      TAKE these medications        acetaminophen 325 MG tablet  Commonly known as:  TYLENOL  Take 2 tablets (650 mg total) by mouth every 6 (six) hours as needed for mild pain, moderate pain, fever or headache.     aspirin 325 MG EC tablet  Take 1 tablet (325 mg total) by mouth daily. Take full dose enteric coated 325 mg Aspirin daily for three weeks, then reduce back to a baby 81 mg Aspirin daily for three additional weeks.     DAPTOmycin 842 mg in sodium chloride 0.9 % 100 mL  Inject 842 mg into the vein daily.     diltiazem 240 MG 24 hr capsule  Commonly known as:  CARDIZEM CD  Take 240 mg by mouth daily.     Fluticasone-Salmeterol 250-50 MCG/DOSE Aepb  Commonly known as:  ADVAIR  Inhale 1 puff into the lungs 2 (two) times daily as needed (wheezing).     folic acid 1 MG tablet  Commonly known as:  FOLVITE  Take 1 mg by mouth daily.     furosemide 40 MG tablet  Commonly known as:  LASIX  Take 40 mg by mouth daily.     losartan 100 MG tablet  Commonly known as:  COZAAR  Take 100 mg by mouth daily.     methocarbamol 500 MG tablet  Commonly known as:  ROBAXIN  Take 1 tablet (500 mg total) by mouth every 6 (six) hours as needed for muscle spasms.     metoprolol succinate 50 MG 24 hr tablet  Commonly known as:  TOPROL-XL  Take 50 mg by mouth daily.     ondansetron 4 MG disintegrating tablet  Commonly known as:  ZOFRAN-ODT  Take 1 tablet (4 mg total) by mouth every 6 (six) hours as needed for nausea.     oxyCODONE 5 MG immediate release tablet  Commonly known as:  Oxy IR/ROXICODONE  Take 1-2 tablets (5-10 mg total) by mouth every 3 (three) hours as needed for moderate pain or severe pain.     PROBIOTIC PO  Take 1 tablet by  mouth daily.     RABEprazole 20 MG tablet  Commonly known as:  ACIPHEX  Take 20 mg by mouth daily.     rifampin 300 MG capsule  Commonly known as:  RIFADIN  Take 1 capsule (300 mg total) by mouth daily.     traMADol 50 MG tablet  Commonly known as:  ULTRAM  Take 1-2 tablets (50-100 mg total) by mouth every 6 (six) hours as  needed (mild pain).        Diagnostic Studies: Dg Knee Complete 4 Views Right  04/17/2016  CLINICAL DATA:  Soft tissue swelling for 2 days EXAM: RIGHT KNEE - COMPLETE 4+ VIEW COMPARISON:  None. FINDINGS: Frontal, lateral, and bilateral oblique views were obtained. Patient is status post total knee replacement with the prosthetic components well-seated. No acute fracture or dislocation is evident. There is bony remodeling along the posterior proximal tibia. There is a moderate joint effusion. No erosive change is evident. IMPRESSION: Prosthetic components appear well seated. No acute fracture or dislocation. Moderate joint effusion present. Bony remodeling or posterior proximal tibia. Electronically Signed   By: Bretta Bang III M.D.   On: 04/17/2016 14:24    Disposition: 01-Home or Self Care      Discharge Instructions    Call MD / Call 911    Complete by:  As directed   If you experience chest pain or shortness of breath, CALL 911 and be transported to the hospital emergency room.  If you develope a fever above 101 F, pus (white drainage) or increased drainage or redness at the wound, or calf pain, call your surgeon's office.     Call MD / Call 911    Complete by:  As directed   If you experience chest pain or shortness of breath, CALL 911 and be transported to the hospital emergency room.  If you develope a fever above 101 F, pus (white drainage) or increased drainage or redness at the wound, or calf pain, call your surgeon's office.     Change dressing    Complete by:  As directed   Change dressing daily with sterile 4 x 4 inch gauze dressing and apply TED  hose. Do not submerge the incision under water.     Change dressing    Complete by:  As directed   Change dressing daily with sterile 4 x 4 inch gauze dressing and apply TED hose.  You may clean the incision with alcohol prior to redressing.     Constipation Prevention    Complete by:  As directed   Drink plenty of fluids.  Prune juice may be helpful.  You may use a stool softener, such as Colace (over the counter) 100 mg twice a day.  Use MiraLax (over the counter) for constipation as needed.     Constipation Prevention    Complete by:  As directed   Drink plenty of fluids.  Prune juice may be helpful.  You may use a stool softener, such as Colace (over the counter) 100 mg twice a day.  Use MiraLax (over the counter) for constipation as needed.     Diet - low sodium heart healthy    Complete by:  As directed      Diet - low sodium heart healthy    Complete by:  As directed      Discharge instructions    Complete by:  As directed   Pick up stool softner and laxative for home use following surgery while on pain medications. Do not submerge incision under water. Please use good hand washing techniques while changing dressing each day. May shower starting three days after surgery. Please use a clean towel to pat the incision dry following showers. Continue to use ice for pain and swelling after surgery. Do not use any lotions or creams on the incision until instructed by your surgeon.  Take a full dose 325 mg Aspirin daily for three weeks and  then reduce to a baby 81 mg Aspirin for an additional three weeks.  Postoperative Constipation Protocol  Constipation - defined medically as fewer than three stools per week and severe constipation as less than one stool per week.  One of the most common issues patients have following surgery is constipation.  Even if you have a regular bowel pattern at home, your normal regimen is likely to be disrupted due to multiple reasons following surgery.   Combination of anesthesia, postoperative narcotics, change in appetite and fluid intake all can affect your bowels.  In order to avoid complications following surgery, here are some recommendations in order to help you during your recovery period.  Colace (docusate) - Pick up an over-the-counter form of Colace or another stool softener and take twice a day as long as you are requiring postoperative pain medications.  Take with a full glass of water daily.  If you experience loose stools or diarrhea, hold the colace until you stool forms back up.  If your symptoms do not get better within 1 week or if they get worse, check with your doctor.  Dulcolax (bisacodyl) - Pick up over-the-counter and take as directed by the product packaging as needed to assist with the movement of your bowels.  Take with a full glass of water.  Use this product as needed if not relieved by Colace only.   MiraLax (polyethylene glycol) - Pick up over-the-counter to have on hand.  MiraLax is a solution that will increase the amount of water in your bowels to assist with bowel movements.  Take as directed and can mix with a glass of water, juice, soda, coffee, or tea.  Take if you go more than two days without a movement. Do not use MiraLax more than once per day. Call your doctor if you are still constipated or irregular after using this medication for 7 days in a row.  If you continue to have problems with postoperative constipation, please contact the office for further assistance and recommendations.  If you experience "the worst abdominal pain ever" or develop nausea or vomiting, please contact the office immediatly for further recommendations for treatment.     Do not put a pillow under the knee. Place it under the heel.    Complete by:  As directed      Do not sit on low chairs, stoools or toilet seats, as it may be difficult to get up from low surfaces    Complete by:  As directed      Driving restrictions    Complete by:   As directed   No driving until released by the physician.     Driving restrictions    Complete by:  As directed   No driving for 6 weeks     Increase activity slowly as tolerated    Complete by:  As directed      Increase activity slowly as tolerated    Complete by:  As directed      Lifting restrictions    Complete by:  As directed   No lifting until released by the physician.     Lifting restrictions    Complete by:  As directed   No lifting for 6 weeks     Patient may shower    Complete by:  As directed   You may shower without a dressing once there is no drainage.  Do not wash over the wound.  If drainage remains, do not shower until drainage stops.  TED hose    Complete by:  As directed   Use stockings (TED hose) for 3 weeks on both leg(s).  You may remove them at night for sleeping.     Weight bearing as tolerated    Complete by:  As directed   Laterality:  right  Extremity:  Lower           Follow-up Information    Follow up with Loanne Drilling, MD. Schedule an appointment as soon as possible for a visit on 05/08/2016.   Specialty:  Orthopedic Surgery   Why:  Call office at [phone] to setup appointment on Tuesday 05/08/2016 with Dr. Lequita Halt.   Contact information:   [address] [place] [address] [phone]       Follow up with [facility].   Why:  IV abx, physical therapy   Contact information:   [phone]       Signed: Thea Gist 04/29/2016, 7:56 AM

## 2016-05-01 LAB — ANAEROBIC CULTURE

## 2016-05-04 ENCOUNTER — Telehealth: Payer: Self-pay

## 2016-05-04 NOTE — Telephone Encounter (Signed)
Patient  was discharged from hospital on 04-29-16 and is taking daptomycin and rifampin.  When he awoke this morning the tip of his penis was swollen and red with  outer skin irritation.   He wants to know if this is caused by any of these medications.  Please advise.   Wife calling : Lupita Leash

## 2016-05-04 NOTE — Telephone Encounter (Signed)
Per Dr Ninetta Lights this may be a yeast infection.  Try Mycelex twice daily for three days and call our office if it does not improve.   Laurell Josephs, RN

## 2016-05-21 ENCOUNTER — Encounter: Payer: Self-pay | Admitting: Infectious Diseases

## 2016-05-21 ENCOUNTER — Ambulatory Visit (INDEPENDENT_AMBULATORY_CARE_PROVIDER_SITE_OTHER): Payer: 59 | Admitting: Infectious Diseases

## 2016-05-21 VITALS — BP 146/91 | HR 78 | Temp 97.5°F | Ht 68.0 in | Wt 311.2 lb

## 2016-05-21 DIAGNOSIS — Z96659 Presence of unspecified artificial knee joint: Principal | ICD-10-CM

## 2016-05-21 DIAGNOSIS — M00061 Staphylococcal arthritis, right knee: Secondary | ICD-10-CM

## 2016-05-21 DIAGNOSIS — T8459XD Infection and inflammatory reaction due to other internal joint prosthesis, subsequent encounter: Secondary | ICD-10-CM

## 2016-05-21 DIAGNOSIS — T8450XD Infection and inflammatory reaction due to unspecified internal joint prosthesis, subsequent encounter: Secondary | ICD-10-CM | POA: Diagnosis not present

## 2016-05-21 NOTE — Progress Notes (Signed)
Phone call to [employer].  [facility] did not receive an order for Lab Work when they received the order for the IV Daptomycin.  The discharge summary written by Dr. Deri Fuelling PA, Alphonsa Overall, did not include the order for weekly CK blood draw.  Verbal order given to [facility] to start weekly CK blood draws.  [facility] verbalized back the order per Dr Johny Sax.  Phone and Fax numbers provided.

## 2016-05-21 NOTE — Progress Notes (Signed)
   Subjective:    Patient ID: [name], male    DOB: 1960/12/24, 55 y.o.   MRN: [id number]  HPI 55 yo M with hx of RA (prev on MTX and prednisone) and recurrent episodes of cellulitis.  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 His Cx 03-2014 grew Serratia and he was treated with 6 weeks of ceftriaxone. His wife also states that he also had Staph in 2015.  He had a Humira injection 6-24 and by 7-2 developed worsening effusions in his knee. He also developed f/c.  He returns to hospital 7-12 after having joint effusion and aspiration x 2. His was noted to have elevated WBC in his knee fluid (14k, 100% N on 7-4), his Cx's grew Staph lugdenensis (R- Tet, Erythro, Clinda).  He underwent I & D, poly-exchange on 7-12.  His hx is also notable for many antibiotic allergies. He was given clindamycin peri-operatively. He was also started on steroids.  He was started on cubicin/rifampin. His CK in hospital was 42. He was d/c home on 7-16.  Stop date 8-23.  He is now walking with crutches. Swelling is better.  No problems with PIC line. He is off MTX til next week. Off prednisone.  Still off humira.   Review of Systems  Constitutional: Negative for appetite change, chills, fever and unexpected weight change.  Gastrointestinal: Negative for constipation and diarrhea.  Genitourinary: Negative for difficulty urinating.  Skin: Negative for rash.      Objective:   Physical Exam  Constitutional: He appears well-developed and well-nourished.  HENT:  Mouth/Throat: No oropharyngeal exudate.  Eyes: EOM are normal. Pupils are equal, round, and reactive to light.  Neck: Neck supple.  Cardiovascular: Normal rate, regular rhythm and normal heart sounds.   Pulmonary/Chest: Effort normal and breath sounds normal.  Abdominal: Soft. Bowel sounds are normal. There is no tenderness. There is no rebound.  Musculoskeletal:       Arms:       Legs: Lymphadenopathy:    He has no cervical adenopathy.          Assessment & Plan:

## 2016-05-21 NOTE — Assessment & Plan Note (Signed)
He is doing well on his current atbx. Will continue to stop date of 8-23.  We will get his labs from [facility] home infusion.  Difficulty is that there are no good oral options for him after he completes IV due to his allergy history.  Will see him back in 3 week.s  His PIC is clean.

## 2016-05-31 ENCOUNTER — Telehealth: Payer: Self-pay

## 2016-05-31 NOTE — Telephone Encounter (Signed)
Wife, Lupita Leash,  called wanting to know did patient need to have Rifampin medication still since he has been on antibiotic for 30 days already. Explained to patient that Dr. Ninetta Lights note stated he is to continue antibiotic medication until 08/23. Wife stated understanding. Rejeana Brock, LPN

## 2016-06-05 ENCOUNTER — Telehealth: Payer: Self-pay | Admitting: Pharmacist

## 2016-06-05 NOTE — Telephone Encounter (Signed)
Walt's wife called and wanted to verify that his antibiotics were to stop today and he is supposed to see Dr. Ninetta Lights on Monday.  I verified from Dr. Moshe Cipro note that his stop date is today and also verified he has an appointment with Dr. Ninetta Lights on Monday.  She also said she had enough antibiotics that were just delivered to last until Friday.  I advised her to just continue until Friday so not to waste any medications.  Cassie L. Roseanne Reno, PharmD Infectious Diseases Clinical Pharmacist Pager: [phone] 06/05/2016 4:04 PM

## 2016-06-08 ENCOUNTER — Encounter: Payer: Self-pay | Admitting: *Deleted

## 2016-06-08 ENCOUNTER — Telehealth: Payer: Self-pay | Admitting: *Deleted

## 2016-06-08 NOTE — Telephone Encounter (Signed)
Order faxed to [phone], confirmation received. [name]

## 2016-06-08 NOTE — Telephone Encounter (Signed)
Patient's antibiotic ends today; can picc line be pulled. [name]

## 2016-06-08 NOTE — Telephone Encounter (Signed)
Yes please

## 2016-06-11 ENCOUNTER — Ambulatory Visit (INDEPENDENT_AMBULATORY_CARE_PROVIDER_SITE_OTHER): Payer: 59 | Admitting: Infectious Diseases

## 2016-06-11 ENCOUNTER — Encounter: Payer: Self-pay | Admitting: *Deleted

## 2016-06-11 VITALS — BP 184/81 | HR 80 | Wt 313.0 lb

## 2016-06-11 DIAGNOSIS — I10 Essential (primary) hypertension: Secondary | ICD-10-CM | POA: Diagnosis not present

## 2016-06-11 DIAGNOSIS — T8453XA Infection and inflammatory reaction due to internal right knee prosthesis, initial encounter: Secondary | ICD-10-CM | POA: Diagnosis not present

## 2016-06-11 NOTE — Assessment & Plan Note (Addendum)
He is doing well.  There are no good oral otions for him with his allergy hx.  Explained we cannot give rif as monotherapy.  Has had f/u with ortho.  We do not have his labs.  Will f/u with his infusion company.  Offered my apologies to pt and his wife for his wait today.  Can f/u prn

## 2016-06-11 NOTE — Progress Notes (Signed)
   Subjective:    Patient ID: [name], male    DOB: 11/24/1960, 55 y.o.   MRN: [id number]  HPI 55 yo M with hx of RA (prev on MTX and prednisone) and recurrent episodes of cellulitis.  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 His Cx 03-2014 grew Serratia and he was treated with 6 weeks of ceftriaxone. His wife also states that he also had Staph in 2015.  He had a Humira injection 6-24 and by 7-2 developed worsening effusions in his knee. He also developed f/c.  He returns to hospital 7-12 after having joint effusion and aspiration x 2. His was noted to have elevated WBC in his knee fluid (14k, 100% N on 7-4), his Cx's grew Staph lugdenensis (R- Tet, Erythro, Clinda).  He underwent I &D, poly-exchange on 7-12.  His hx is also notable for many antibiotic allergies. He was given clindamycin peri-operatively. He was also started on steroids.  He was started on cubicin/rifampin. His CK in hospital was 42. He was d/c home on 7-16.  He completed his anbx 8-23. Feels like his leg is doing well.  Wound is closed. His wt bearing is "fine". No PT.  Back on plaquinel, restarting MTX 8-25.   Has rash at [facility] site. Has been putting 1% hydrocortisone cream, mixing with neosporin.   Review of Systems  Constitutional: Negative for chills and fever.       Objective:   Physical Exam  Constitutional: He appears well-developed and well-nourished.  Musculoskeletal:       Arms:      Legs:         Assessment & Plan:

## 2016-07-12 ENCOUNTER — Ambulatory Visit: Payer: 59 | Attending: Rheumatology | Admitting: Occupational Therapy

## 2016-07-12 DIAGNOSIS — M6281 Muscle weakness (generalized): Secondary | ICD-10-CM | POA: Diagnosis present

## 2016-07-12 DIAGNOSIS — M25641 Stiffness of right hand, not elsewhere classified: Secondary | ICD-10-CM | POA: Insufficient documentation

## 2016-07-12 DIAGNOSIS — M25642 Stiffness of left hand, not elsewhere classified: Secondary | ICD-10-CM | POA: Insufficient documentation

## 2016-07-12 NOTE — Therapy (Signed)
[place] [facility] [address], [place], [address] Phone: [phone]   Fax:  [phone]  Occupational Therapy Treatment  Patient Details  Name: [name] MRN: [id number] Date of Birth: 06-11-1961 Referring Provider: Gavin Potters  Encounter Date: 07/12/2016      OT End of Session - 07/12/16 1733    Visit Number 1   Number of Visits 4   Date for OT Re-Evaluation 08/09/16   OT Start Time 1437   OT Stop Time 1550   OT Time Calculation (min) 73 min   Activity Tolerance Patient tolerated treatment well   Behavior During Therapy [facility] for tasks assessed/performed      Past Medical History:  Diagnosis Date  . Asthma   . Cellulitis of lower extremity    "usually RLL; this time it's got up to my lower abdoment" (02/11/2014)-series of antibiotics completed 02-28-14  . Diverticulosis   . GERD (gastroesophageal reflux disease)   . Hypertension   . OSA (obstructive sleep apnea)    "quit wearing mask several years ago" (02/11/2014)  . PONV (postoperative nausea and vomiting)   . Rheumatoid arthritis (HCC) dx'd ~ 1977  . S/P PICC central line placement 03-16-14   right upper arm remains intact.05-11-14 removed 1 week ago.    Past Surgical History:  Procedure Laterality Date  . EXCISIONAL HEMORRHOIDECTOMY    . EXCISIONAL TOTAL KNEE ARTHROPLASTY WITH ANTIBIOTIC SPACERS Right 03/18/2014   Procedure: RIGHT KNEE RESECTION ARTHROPLASTY WITH ANTIBIOTIC SPACERS;  Surgeon: Loanne Drilling, MD;  Location: [facility];  Service: Orthopedics;  Laterality: Right;  . FOREIGN BODY REMOVAL Left    "BB" removal above left eye-teen yrs.  . I&D KNEE WITH POLY EXCHANGE Right 04/25/2016   Procedure: RIGHT KNEE IRRIGATION AND DEBRIDEMENT WITH POLY EXCHANGE;  Surgeon: Ollen Gross, MD;  Location: [facility];  Service: Orthopedics;  Laterality: Right;  . INCISION AND DRAINAGE Right 09/28/2014   Procedure: INCISION AND DRAINAGE RIGHT KNEE;  Surgeon: Loanne Drilling, MD;  Location: [facility];  Service: Orthopedics;  Laterality: Right;  . picc line placement Right    right upper arm  . 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 REVISION TOTAL KNEE ARTHROPLASTY Right 2012  . SPIGELIAN HERNIA Right 12/04/2015   Procedure: INCARCERATED SPIGELIAN HERNIA REPAIR WITH MESH;  Surgeon: Violeta Gelinas, MD;  Location: [facility];  Service: General;  Laterality: Right;  . TOTAL KNEE ARTHROPLASTY Right 05/21/2014   Procedure: RIGHT KNEE ARTHROPLASTY REINPLANTATION;  Surgeon: Loanne Drilling, MD;  Location: [facility];  Service: Orthopedics;  Laterality: Right;    There were no vitals filed for this visit.      Subjective Assessment - 07/12/16 1714    Subjective  Was diagnosis with arthritis when I was 18 - was soing okay - with some flareups at times - but then in July or Aug developed cellulitis right leg. Then swollen right knee. Aspirate showed infection. Growing staph. Had procedure with removal of components.  Off all rheumatoid drugs- just started back on shots for methatraxate - can tell difference already    Patient Stated Goals Want to decrease stiffness , flareups and prevent range of motion/grip  to get worse   Currently in Pain? 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[employer], like to fish, do some cooking, play on table , driving , watch tv     AROM   Right Forearm Supination 50 Degrees   Left Forearm Supination 45 Degrees   Right Wrist Extension 40 Degrees   Right Wrist Flexion 37 Degrees   Right Wrist Radial Deviation 20 Degrees   Right Wrist Ulnar  Deviation 12 Degrees   Left Wrist Extension 42 Degrees   Left Wrist Flexion 33 Degrees   Left Wrist Radial Deviation 20 Degrees   Left Wrist Ulnar Deviation 6 Degrees     Strength   Right Hand Grip (lbs) 36   Right Hand Lateral Pinch 15 lbs   Right Hand 3 Point Pinch 12 lbs   Left Hand Grip (lbs) 40   Left Hand Lateral Pinch 16 lbs   Left Hand 3 Point Pinch 13 lbs     Right Hand AROM   R Index  MCP 0-90 85 Degrees   R Index PIP 0-100 60 Degrees   R Long  MCP 0-90 80 Degrees   R Long PIP 0-100 95 Degrees   R Ring  MCP 0-90 85 Degrees   R Ring PIP 0-100 95 Degrees   R Little  MCP 0-90 90 Degrees   R Little PIP 0-100 95 Degrees     Left Hand AROM   L Index  MCP 0-90 75 Degrees   L Index PIP 0-100 90 Degrees   L Long  MCP 0-90 85 Degrees   L Long PIP 0-100 85 Degrees   L Ring  MCP 0-90 85 Degrees   L Ring PIP 0-100 100 Degrees   L Little  MCP 0-90 90 Degrees   L Little PIP 0-100 90 Degrees       Paraffin to bilateral hands prior to review of HEP - pt showed decrease stiffness  Hand out reviewed for AROM and  Joint protection principles                    OT Education - 07/12/16 1732    Education provided Yes   Education Details Findings of eval , joint protection , HEP    Person(s) Educated Patient   Methods Explanation;Demonstration;Tactile cues;Verbal cues;Handout   Comprehension Verbal cues required;Returned demonstration;Verbalized understanding          OT Short Term Goals - 07/12/16 1739      OT SHORT TERM GOAL #1   Title Pt to be ind in HEP to increase or maintain AROM in digits by at least 5 degrees and grip strength    Baseline see flowsheet   Time 3   Period Weeks   Status New           OT Long Term Goals - 07/12/16 1740      OT LONG TERM GOAL #1   Title Pt verbalize 3 joint protection and AE to increaes ease for independence in ADL's and IADL's    Baseline no knowledge    Time 4   Period Weeks   Status New     OT LONG  TERM GOAL #2   Title Grip strength in R and L hand increase with  3-5 lbs to turn doorknob with more ease    Baseline Grip R 36, L 40 lbs    Time 4   Period Weeks   Status New               Plan - 07/12/16 1734    Clinical Impression Statement Pt present with long standing diagnosis of RA - pt report thru the years had multiple flare ups - has stiffness in shoulders, elbows, wrist and digits - pt report after back on meds since knee surgery pain better but stiffness more -  some pain at times if over doing things - pt do show decrease ROM in all digits with 2nd digits the worse,  grip and prehension below average for  his age , show increase UD of R hand digits out of MC's - pt in need for educatoin to increase or maintain ROM , strength - and joint protection principles and modifcations    Rehab Potential Good   Clinical Impairments Affecting Rehab Potential chronic condition   OT Frequency 1x / week   OT Duration 4 weeks   OT Treatment/Interventions Self-care/ADL training;DME and/or AE instruction;Patient/family education;Therapeutic exercises;Contrast Bath;Passive range of motion;Parrafin;Manual Therapy   Plan assess implemenation of HEP    OT Home Exercise Plan see pt instruction    Consulted and Agree with Plan of Care Patient      Patient will benefit from skilled therapeutic intervention in order to improve the following deficits and impairments:  Decreased range of motion, Impaired flexibility, Pain, Decreased strength, Decreased knowledge of use of DME  Visit Diagnosis: Stiffness of left hand, not elsewhere classified  Stiffness of right hand, not elsewhere classified  Muscle weakness (generalized)    Problem List Patient Active Problem List   Diagnosis Date Noted  . Septic arthritis of knee, right (HCC) 04/25/2016  . 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 Patellar bursitis of right knee 09/28/2014  . Infected prepatellar bursa 09/28/2014  . OA  (osteoarthritis) of knee 05/21/2014  . 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 Cellulitis and abscess of trunk 02/15/2014    Class: Acute  . Anemia 02/15/2014    Class: Chronic  . Cellulitis 02/11/2014  . Cellulitis of leg, right 01/13/2013    Class: Acute  . Rheumatoid arthritis(714.0) 01/13/2013    Class: Chronic  . Obstructive sleep apnea 01/13/2013    Class: Chronic  . Chronic venous insufficiency 01/13/2013    Class: Chronic    Oletta Cohn OTR/l,CLT  07/12/2016, 5:45 PM  [place] [facility] [address], [place], [address] Phone: [phone]   Fax:  [phone]  Name: [name] MRN: [id number] Date of Birth: 13-Dec-1960

## 2016-07-12 NOTE — Patient Instructions (Signed)
Heat at home  AROM tendon glides RD of digits  10 reps each  Hand out and reviewed Joint protection principles

## 2016-07-19 ENCOUNTER — Ambulatory Visit: Payer: 59 | Attending: Rheumatology | Admitting: Occupational Therapy

## 2016-07-19 DIAGNOSIS — M25642 Stiffness of left hand, not elsewhere classified: Secondary | ICD-10-CM | POA: Insufficient documentation

## 2016-07-19 DIAGNOSIS — M25641 Stiffness of right hand, not elsewhere classified: Secondary | ICD-10-CM | POA: Diagnosis present

## 2016-07-19 DIAGNOSIS — M6281 Muscle weakness (generalized): Secondary | ICD-10-CM

## 2016-07-19 NOTE — Therapy (Signed)
[place] [facility] [address], [place], [address] Phone: [phone]   Fax:  [phone]  Occupational Therapy Treatment  Patient Details  Name: [name] MRN: [id number] Date of Birth: 08/15/1961 Referring Provider: Gavin Potters  Encounter Date: 07/19/2016      OT End of Session - 07/19/16 1818    Visit Number 2   Number of Visits 4   Date for OT Re-Evaluation 08/09/16   OT Start Time 1719   OT Stop Time 1810   OT Time Calculation (min) 51 min   Activity Tolerance Patient tolerated treatment well   Behavior During Therapy [facility] for tasks assessed/performed      Past Medical History:  Diagnosis Date  . Asthma   . Cellulitis of lower extremity    "usually RLL; this time it's got up to my lower abdoment" (02/11/2014)-series of antibiotics completed 02-28-14  . Diverticulosis   . GERD (gastroesophageal reflux disease)   . Hypertension   . OSA (obstructive sleep apnea)    "quit wearing mask several years ago" (02/11/2014)  . PONV (postoperative nausea and vomiting)   . Rheumatoid arthritis (HCC) dx'd ~ 1977  . S/P PICC central line placement 03-16-14   right upper arm remains intact.05-11-14 removed 1 week ago.    Past Surgical History:  Procedure Laterality Date  . EXCISIONAL HEMORRHOIDECTOMY    . EXCISIONAL TOTAL KNEE ARTHROPLASTY WITH ANTIBIOTIC SPACERS Right 03/18/2014   Procedure: RIGHT KNEE RESECTION ARTHROPLASTY WITH ANTIBIOTIC SPACERS;  Surgeon: Loanne Drilling, MD;  Location: [facility];  Service: Orthopedics;  Laterality: Right;  . FOREIGN BODY REMOVAL Left    "BB" removal above left eye-teen yrs.  . I&D KNEE WITH POLY EXCHANGE Right 04/25/2016   Procedure: RIGHT KNEE IRRIGATION AND DEBRIDEMENT WITH POLY EXCHANGE;  Surgeon: Ollen Gross, MD;  Location: [facility];  Service: Orthopedics;  Laterality: Right;  . INCISION AND DRAINAGE Right 09/28/2014   Procedure: INCISION AND DRAINAGE RIGHT KNEE;  Surgeon: Loanne Drilling, MD;  Location: [facility];  Service: Orthopedics;  Laterality: Right;  . picc line placement Right    right upper arm  . 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 REVISION TOTAL KNEE ARTHROPLASTY Right 2012  . SPIGELIAN HERNIA Right 12/04/2015   Procedure: INCARCERATED SPIGELIAN HERNIA REPAIR WITH MESH;  Surgeon: Violeta Gelinas, MD;  Location: [facility] OR;  Service: General;  Laterality: Right;  . TOTAL KNEE ARTHROPLASTY Right 05/21/2014   Procedure: RIGHT KNEE ARTHROPLASTY REINPLANTATION;  Surgeon: Loanne Drilling, MD;  Location: [facility];  Service: Orthopedics;  Laterality: Right;    There were no vitals filed for this visit.      Subjective Assessment - 07/19/16 1740    Subjective  No pain more stiffness, I did get the paraffin bath and do that in the evenring - but not as big as yours - this week is my first week back at work - did okay    Patient Stated Goals Want to decrease stiffness , flareups and prevent range of motion/grip  to get worse   Currently in Pain? Yes   Pain Score 1    Pain Location Hand   Pain Orientation Right;Left   Pain Descriptors / Indicators Tightness   Pain Type Chronic pain            [facility] OT Assessment -  07/19/16 0001      AROM   Right Wrist Flexion 58 Degrees   Left Wrist Extension 48 Degrees   Left Wrist Flexion 42 Degrees                  OT Treatments/Exercises (OP) - 07/19/16 0001      RUE Fluidotherapy   Number Minutes Fluidotherapy 10 Minutes   RUE Fluidotherapy Location Hand;Wrist   Comments AROM for wrist and digits in all planes  at [facility]      LUE Fluidotherapy   Number Minutes Fluidotherapy 10 Minutes   LUE Fluidotherapy Location Hand;Wrist   Comments At [facility] to decrease pain aind increase ROM        Measurements taken in digits  And grip and prehension strength  Fluido done bilateral hands  and wrist Measure ROM for wrist in all planes - great progress in flexion and extention  supination about the same  PROM for L sup - did show 10 degrees increase  - with 10 reps - place and hold  No increase on R   Tendon glides  Did AAROM for each step - with gentle PROM to composite fist for 2nd digits bilateral hands  add to HEP  Opposition to all digits RD of digits on table  Add PROM for PIP extention R hand more than L - but need to be gentle   PROM for wrist extention - prayer  Stretch and flexion  Some pain with flexion - pt to stop prior to pain  10 reps each            OT Education - 07/19/16 1817    Education provided Yes   Education Details HEP update and changes   Person(s) Educated Patient   Methods Explanation;Demonstration;Tactile cues;Verbal cues;Handout   Comprehension Verbal cues required;Returned demonstration;Verbalized understanding          OT Short Term Goals - 07/12/16 1739      OT SHORT TERM GOAL #1   Title Pt to be ind in HEP to increase or maintain AROM in digits by at least 5 degrees and grip strength    Baseline see flowsheet   Time 3   Period Weeks   Status New           OT Long Term Goals - 07/12/16 1740      OT LONG TERM GOAL #1   Title Pt verbalize 3 joint protection and AE to increaes ease for independence in ADL's and IADL's    Baseline no knowledge    Time 4   Period Weeks   Status New     OT LONG TERM GOAL #2   Title Grip strength in R and L hand increase with 3-5 lbs to turn doorknob with more ease    Baseline Grip R 36, L 40 lbs    Time 4   Period Weeks   Status New               Plan - 07/19/16 1818    Clinical Impression Statement Pt showed some progress in grip strength, wrist AROM - and digits about the same - pt's 2nd digit deviating and not able to pull past 3rd - pt to do some gentle PROM and AAROM for  composite fisting - keep pain  about 1/10 and use his paraffin bath - will reassess next  session    Rehab Potential Good   Clinical Impairments Affecting Rehab Potential chronic condition   OT  Frequency 1x / week   OT Duration 4 weeks   OT Treatment/Interventions Self-care/ADL training;DME and/or AE instruction;Patient/family education;Therapeutic exercises;Contrast Bath;Passive range of motion;Parrafin;Manual Therapy   Plan assesss progress with HEP in 2nd digits    OT Home Exercise Plan see pt instruction    Consulted and Agree with Plan of Care Patient      Patient will benefit from skilled therapeutic intervention in order to improve the following deficits and impairments:  Decreased range of motion, Impaired flexibility, Pain, Decreased strength, Decreased knowledge of use of DME  Visit Diagnosis: Stiffness of left hand, not elsewhere classified  Stiffness of right hand, not elsewhere classified  Muscle weakness (generalized)    Problem List Patient Active Problem List   Diagnosis Date Noted  . Septic arthritis of knee, right (HCC) 04/25/2016  . 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 Patellar bursitis of right knee 09/28/2014  . Infected prepatellar bursa 09/28/2014  . OA (osteoarthritis) of knee 05/21/2014  . 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 Cellulitis and abscess of trunk 02/15/2014    Class: Acute  . Anemia 02/15/2014    Class: Chronic  . Cellulitis 02/11/2014  . Cellulitis of leg, right 01/13/2013    Class: Acute  . Rheumatoid arthritis(714.0) 01/13/2013    Class: Chronic  . Obstructive sleep apnea 01/13/2013    Class: Chronic  . Chronic venous insufficiency 01/13/2013    Class: Chronic    Oletta Cohn OTR/L,CLT  07/19/2016, 6:25 PM  [place] [facility] [address], [place], [address] Phone: [phone]   Fax:  [phone]  Name: [name] MRN: [id number] Date of Birth: September 05, 1961

## 2016-07-19 NOTE — Patient Instructions (Addendum)
Paraffin at home  PROM for wrist flexion and extention  Tendon glides  With gentle PROM to composite fist for 2nd digits bilateral hands   Opposition to all digits RD of digits on table  Add PROM for PIP extention R hand more than L - but need to be gentle    10 reps each

## 2016-07-26 ENCOUNTER — Ambulatory Visit: Payer: 59 | Admitting: Occupational Therapy

## 2016-07-26 DIAGNOSIS — M25642 Stiffness of left hand, not elsewhere classified: Secondary | ICD-10-CM

## 2016-07-26 DIAGNOSIS — M25641 Stiffness of right hand, not elsewhere classified: Secondary | ICD-10-CM

## 2016-07-26 DIAGNOSIS — M6281 Muscle weakness (generalized): Secondary | ICD-10-CM

## 2016-07-26 NOTE — Patient Instructions (Signed)
   Same HEP - but focus on gentle PROM for L 2nd digit MC and R 2nd PIP Flexion - will see again in 2 wks

## 2016-07-26 NOTE — Therapy (Signed)
[place] [facility] [address], [place], [address] Phone: [phone]   Fax:  [phone]  Occupational Therapy Treatment  Patient Details  Name: [name] MRN: [id number] Date of Birth: 1960-10-16 Referring Provider: Gavin [name]  Encounter Date: 07/26/2016      OT End of Session - 07/26/16 1752    Visit Number 3   Number of Visits 4   Date for OT Re-Evaluation 08/09/16   OT Start Time 1640   OT Stop Time 1724   OT Time Calculation (min) 44 min   Activity Tolerance Patient tolerated treatment well   Behavior During Therapy [facility] for tasks assessed/performed      Past Medical History:  Diagnosis Date  . Asthma   . Cellulitis of lower extremity    "usually RLL; this time it's got up to my lower abdoment" (02/11/2014)-series of antibiotics completed 02-28-14  . Diverticulosis   . GERD (gastroesophageal reflux disease)   . Hypertension   . OSA (obstructive sleep apnea)    "quit wearing mask several years ago" (02/11/2014)  . PONV (postoperative nausea and vomiting)   . Rheumatoid arthritis (HCC) dx'd ~ 1977  . S/P PICC central line placement 03-16-14   right upper arm remains intact.05-11-14 removed 1 week ago.    Past Surgical History:  Procedure Laterality Date  . EXCISIONAL HEMORRHOIDECTOMY    . EXCISIONAL TOTAL KNEE ARTHROPLASTY WITH ANTIBIOTIC SPACERS Right 03/18/2014   Procedure: RIGHT KNEE RESECTION ARTHROPLASTY WITH ANTIBIOTIC SPACERS;  Surgeon: Vincent Drilling, MD;  Location: [facility];  Service: Orthopedics;  Laterality: Right;  . FOREIGN BODY REMOVAL Left    "BB" removal above left eye-teen yrs.  . I&D KNEE WITH POLY EXCHANGE Right 04/25/2016   Procedure: RIGHT KNEE IRRIGATION AND DEBRIDEMENT WITH POLY EXCHANGE;  Surgeon: Vincent Gross, MD;  Location: [facility];  Service: Orthopedics;  Laterality: Right;  . INCISION AND DRAINAGE Right 09/28/2014   Procedure: INCISION AND DRAINAGE RIGHT KNEE;  Surgeon: Vincent Drilling, MD;  Location: [facility];  Service: Orthopedics;  Laterality: Right;  . picc line placement Right    right upper arm  . 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 REVISION TOTAL KNEE ARTHROPLASTY Right 2012  . SPIGELIAN HERNIA Right 12/04/2015   Procedure: INCARCERATED SPIGELIAN HERNIA REPAIR WITH MESH;  Surgeon: Vincent Gelinas, MD;  Location: [facility] OR;  Service: General;  Laterality: Right;  . TOTAL KNEE ARTHROPLASTY Right 05/21/2014   Procedure: RIGHT KNEE ARTHROPLASTY REINPLANTATION;  Surgeon: Vincent Drilling, MD;  Location: [facility];  Service: Orthopedics;  Laterality: Right;    There were no vitals filed for this visit.      Subjective Assessment - 07/26/16 1742    Subjective  Doing okay - not really pain - even with me using the screwdriver in R hand for 4 hrs - and over time last week and this week - doing paraffin in the pm - pain better and not as stiff    Patient Stated Goals Want to decrease stiffness , flareups and prevent range of motion/grip  to get worse   Currently in Pain? No/denies            [facility] OT Assessment - 07/26/16 0001      AROM   Right Wrist Extension 50 Degrees   Right Wrist Flexion 58 Degrees  Right Wrist Radial Deviation 20 Degrees   Right Wrist Ulnar Deviation 12 Degrees   Left Wrist Extension 50 Degrees   Left Wrist Flexion 45 Degrees   Left Wrist Radial Deviation 20 Degrees   Left Wrist Ulnar Deviation 2 Degrees     Strength   Right Hand Grip (lbs) 40   Right Hand Lateral Pinch 17 lbs   Right Hand 3 Point Pinch 12 lbs   Left Hand Grip (lbs) 40   Left Hand Lateral Pinch 19 lbs   Left Hand 3 Point Pinch 12 lbs     Right Hand AROM   R Index  MCP 0-90 85 Degrees   R Index PIP 0-100 65 Degrees   R Long  MCP 0-90 85 Degrees   R Long PIP 0-100 100 Degrees   R Ring  MCP 0-90 90 Degrees   R Ring PIP 0-100 100  Degrees   R Little  MCP 0-90 90 Degrees   R Little PIP 0-100 95 Degrees     Left Hand AROM   L Index  MCP 0-90 75 Degrees   L Index PIP 0-100 90 Degrees   L Long  MCP 0-90 90 Degrees   L Long PIP 0-100 85 Degrees   L Ring  MCP 0-90 85 Degrees   L Ring PIP 0-100 100 Degrees   L Little  MCP 0-90 90 Degrees   L Little PIP 0-100 90 Degrees                  OT Treatments/Exercises (OP) - 07/26/16 0001      RUE Fluidotherapy   Number Minutes Fluidotherapy 10 Minutes   RUE Fluidotherapy Location Hand;Wrist   Comments AROM for wrist and digits at [facility] to increase ROM      LUE Fluidotherapy   Number Minutes Fluidotherapy 10 Minutes   LUE Fluidotherapy Location Hand;Wrist   Comments [facility] to increase ROM at wrist and digits - focus on 2nd MC flexion       Measurements taken in bilateral  digits and wrist  as well as grip and prehension strength   See flow sheet   Fluido done bilateral hands and wrist  Tendon glides  Did AAROM  - with gentle PROM to composite fist for 2nd digits with focus on L MC and R 2nd PIP  add to HEP  PROM gentle for wrist flexion and extention  5 reps -             OT Education - 07/26/16 1752    Education provided Yes   Education Details HEP changes   Person(s) Educated Patient   Methods Explanation;Demonstration;Tactile cues;Verbal cues   Comprehension Verbal cues required;Verbalized understanding          OT Short Term Goals - 07/26/16 1755      OT SHORT TERM GOAL #1   Title Pt to be ind in HEP to increase or maintain AROM in digits by at least 5 degrees and grip strength    Baseline progressing   Time 2   Period Weeks   Status On-going           OT Long Term Goals - 07/26/16 1755      OT LONG TERM GOAL #1   Title Pt verbalize 3 joint protection and AE to increaes ease for independence in ADL's and IADL's    Baseline using larger joints - and try to avoid tight ,sustained grip - building tools handles up  Time 2    Period Weeks   Status On-going     OT LONG TERM GOAL #2   Title Grip strength in R and L hand increase with 3-5 lbs to turn doorknob with more ease    Baseline Grip R 40, L 40    Time 2   Period Weeks   Status Achieved               Plan - 07/26/16 1753    Clinical Impression Statement Pt showed increase AROM in R digits more than L - increase wrist AROM flexion and extention -and grip/lat grip on R , and lat grip on L - pt to cont with HEP for ROM , and joint protection   for 2wks   Rehab Potential Good   Clinical Impairments Affecting Rehab Potential chronic condition   OT Frequency Biweekly   OT Duration 2 weeks   OT Treatment/Interventions Self-care/ADL training;DME and/or AE instruction;Patient/family education;Therapeutic exercises;Contrast Bath;Passive range of motion;Parrafin;Manual Therapy   Plan assess progress and PRWHE - possible discharge   OT Home Exercise Plan see pt instruction    Consulted and Agree with Plan of Care Patient      Patient will benefit from skilled therapeutic intervention in order to improve the following deficits and impairments:  Decreased range of motion, Impaired flexibility, Pain, Decreased strength, Decreased knowledge of use of DME  Visit Diagnosis: Stiffness of left hand, not elsewhere classified  Stiffness of right hand, not elsewhere classified  Muscle weakness (generalized)    Problem List Patient Active Problem List   Diagnosis Date Noted  . Septic arthritis of knee, right (HCC) 04/25/2016  . 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 Patellar bursitis of right knee 09/28/2014  . Infected prepatellar bursa 09/28/2014  . OA (osteoarthritis) of knee 05/21/2014  . 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 Cellulitis and abscess of trunk 02/15/2014    Class: Acute  . Anemia 02/15/2014    Class: Chronic  . Cellulitis 02/11/2014  . Cellulitis of leg, right 01/13/2013    Class: Acute  .  Rheumatoid arthritis(714.0) 01/13/2013    Class: Chronic  . Obstructive sleep apnea 01/13/2013    Class: Chronic  . Chronic venous insufficiency 01/13/2013    Class: Chronic    [name] OTR/L,CLT 07/26/2016, 5:57 PM  [facility] [address] [place], [address] Phone: [phone]   Fax:  [phone]  Name: [name] MRN: [id number] Date of Birth: [birth year]

## 2016-08-09 ENCOUNTER — Ambulatory Visit: Payer: 59 | Admitting: Occupational Therapy

## 2016-08-09 DIAGNOSIS — M25642 Stiffness of left hand, not elsewhere classified: Secondary | ICD-10-CM | POA: Diagnosis not present

## 2016-08-09 DIAGNOSIS — M6281 Muscle weakness (generalized): Secondary | ICD-10-CM

## 2016-08-09 DIAGNOSIS — M25641 Stiffness of right hand, not elsewhere classified: Secondary | ICD-10-CM

## 2016-08-09 NOTE — Patient Instructions (Signed)
Pt to cont with Paraffin if can 2 x day  Tendon glides  RD of digits  Opposition   And joint protection principles

## 2016-08-09 NOTE — Therapy (Signed)
[facility] [address], [place], [address] Phone: [phone]   Fax:  [phone]  Occupational Therapy Treatment/discharge  Patient Details  Name: [name] MRN: [id number] Date of Birth: August 24, 1961 Referring Provider: Gavin Potters  Encounter Date: 08/09/2016      OT End of Session - 08/09/16 1857    Visit Number 4   Number of Visits 4   Date for OT Re-Evaluation 08/09/16   OT Start Time 1530   OT Stop Time 1601   OT Time Calculation (min) 31 min   Activity Tolerance Patient tolerated treatment well   Behavior During Therapy [facility] for tasks assessed/performed      Past Medical History:  Diagnosis Date  . Asthma   . Cellulitis of lower extremity    "usually RLL; this time it's got up to my lower abdoment" (02/11/2014)-series of antibiotics completed 02-28-14  . Diverticulosis   . GERD (gastroesophageal reflux disease)   . Hypertension   . OSA (obstructive sleep apnea)    "quit wearing mask several years ago" (02/11/2014)  . PONV (postoperative nausea and vomiting)   . Rheumatoid arthritis (HCC) dx'd ~ 1977  . S/P PICC central line placement 03-16-14   right upper arm remains intact.05-11-14 removed 1 week ago.    Past Surgical History:  Procedure Laterality Date  . EXCISIONAL HEMORRHOIDECTOMY    . EXCISIONAL TOTAL KNEE ARTHROPLASTY WITH ANTIBIOTIC SPACERS Right 03/18/2014   Procedure: RIGHT KNEE RESECTION ARTHROPLASTY WITH ANTIBIOTIC SPACERS;  Surgeon: Loanne Drilling, MD;  [facility]: [facility];  Service: Orthopedics;  Laterality: Right;  . FOREIGN BODY REMOVAL Left    "BB" removal above left eye-teen yrs.  . I&D KNEE WITH POLY EXCHANGE Right 04/25/2016   Procedure: RIGHT KNEE IRRIGATION AND DEBRIDEMENT WITH POLY EXCHANGE;  Surgeon: Ollen Gross, MD;  [facility]: [facility];  Service: Orthopedics;  Laterality: Right;  . INCISION AND DRAINAGE Right 09/28/2014   Procedure: INCISION AND DRAINAGE RIGHT KNEE;   Surgeon: Loanne Drilling, MD;  Location: [facility];  Service: Orthopedics;  Laterality: Right;  . picc line placement Right    right upper arm  . 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 REVISION TOTAL KNEE ARTHROPLASTY Right 2012  . SPIGELIAN HERNIA Right 12/04/2015   Procedure: INCARCERATED SPIGELIAN HERNIA REPAIR WITH MESH;  Surgeon: Violeta Gelinas, MD;  Location: [facility] OR;  Service: General;  Laterality: Right;  . TOTAL KNEE ARTHROPLASTY Right 05/21/2014   Procedure: RIGHT KNEE ARTHROPLASTY REINPLANTATION;  Surgeon: Loanne Drilling, MD;  Location: [facility];  Service: Orthopedics;  Laterality: Right;    There were no vitals filed for this visit.      Subjective Assessment - 08/09/16 1855    Subjective  Pain is better since I started with you and strength maybe - using the paraffin bath is helping - and if can trying to modify how I grip or use my hands at work    Patient Stated Goals Want to decrease stiffness , flareups and prevent range of motion/grip  to get worse   Currently in Pain? No/denies   Pain Descriptors / Indicators Tightness            [facility] OT Assessment - 08/09/16 0001      Strength   Right Hand Grip (lbs) 45   Right Hand Lateral Pinch 18 lbs  Right Hand 3 Point Pinch 14 lbs   Left Hand Grip (lbs) 42   Left Hand Lateral Pinch 19 lbs   Left Hand 3 Point Pinch 12 lbs     Right Hand AROM   R Index  MCP 0-90 90 Degrees   R Index PIP 0-100 65 Degrees   R Long  MCP 0-90 85 Degrees   R Long PIP 0-100 100 Degrees   R Ring  MCP 0-90 90 Degrees   R Ring PIP 0-100 100 Degrees   R Little  MCP 0-90 90 Degrees   R Little PIP 0-100 95 Degrees     Left Hand AROM   L Long PIP 0-100 90 Degrees      Reviewed HEP for discharge with pt - discuss progress made in session   Pt to cont with Paraffin if can 2 x day  Tendon glides  RD of digits   Opposition   Joint protection principles Discuss with pt to cont with                    OT Education - 08/09/16 1857    Education provided Yes   Education Details discharge instructions    Person(s) Educated Patient   Methods Explanation;Demonstration   Comprehension Verbalized understanding;Returned demonstration          OT Short Term Goals - 08/09/16 1859      OT SHORT TERM GOAL #1   Title Pt to be ind in HEP to increase or maintain AROM in digits by at least 5 degrees and grip strength    Status Achieved           OT Long Term Goals - 08/09/16 1859      OT LONG TERM GOAL #1   Title Pt verbalize 3 joint protection and AE to increaes ease for independence in ADL's and IADL's    Status Achieved     OT LONG TERM GOAL #2   Title Grip strength in R and L hand increase with 3-5 lbs to turn doorknob with more ease    Status Achieved               Plan - 08/09/16 1858    Clinical Impression Statement Pt showed progress in pain , grip /prehension - as well as knowledge of joint protection and AE - pt to cont with HEP to maintain AROM in digits , keep pain under control and keep strength in hands  - discharge with HEP    Rehab Potential Good   Clinical Impairments Affecting Rehab Potential chronic condition   OT Treatment/Interventions Self-care/ADL training;DME and/or AE instruction;Patient/family education;Therapeutic exercises;Contrast Bath;Passive range of motion;Parrafin;Manual Therapy   Plan discharge with HEP    OT Home Exercise Plan see pt instruction    Consulted and Agree with Plan of Care Patient      Patient will benefit from skilled therapeutic intervention in order to improve the following deficits and impairments:     Visit Diagnosis: Stiffness of left hand, not elsewhere classified  Stiffness of right hand, not elsewhere classified  Muscle weakness (generalized)    Problem List Patient Active Problem List   Diagnosis Date  Noted  . Septic arthritis of knee, right (HCC) 04/25/2016  . 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 Patellar bursitis of right knee 09/28/2014  . Infected prepatellar bursa 09/28/2014  . OA (osteoarthritis) of knee 05/21/2014  . 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  Cellulitis and abscess of trunk 02/15/2014    Class: Acute  . Anemia 02/15/2014    Class: Chronic  . Cellulitis 02/11/2014  . Cellulitis of leg, right 01/13/2013    Class: Acute  . Rheumatoid arthritis(714.0) 01/13/2013    Class: Chronic  . Obstructive sleep apnea 01/13/2013    Class: Chronic  . Chronic venous insufficiency 01/13/2013    Class: Chronic    Oletta Cohn OTR/L,CLT 08/09/2016, 7:01 PM  [facility] [address] [place], [address] Phone: [phone]   Fax:  [phone]  Name: [name] MRN: [id number] Date of Birth: 02-06-1961

## 2016-09-28 ENCOUNTER — Encounter (HOSPITAL_COMMUNITY): Payer: Self-pay | Admitting: *Deleted

## 2016-09-28 ENCOUNTER — Ambulatory Visit: Payer: Self-pay | Admitting: Orthopedic Surgery

## 2016-10-01 ENCOUNTER — Encounter (HOSPITAL_COMMUNITY): Payer: Self-pay | Admitting: Anesthesiology

## 2016-10-01 ENCOUNTER — Inpatient Hospital Stay (HOSPITAL_COMMUNITY)
Admission: AD | Admit: 2016-10-01 | Discharge: 2016-10-04 | DRG: 464 | Disposition: A | Discharge status: Home Health | Payer: 59 | Source: Ambulatory Visit | Attending: Orthopedic Surgery | Admitting: Orthopedic Surgery

## 2016-10-01 ENCOUNTER — Encounter (HOSPITAL_COMMUNITY)
Admission: AD | Disposition: A | Discharge status: Home Health | Payer: Self-pay | Source: Ambulatory Visit | Attending: Orthopedic Surgery

## 2016-10-01 ENCOUNTER — Ambulatory Visit (HOSPITAL_COMMUNITY): Payer: 59 | Admitting: Anesthesiology

## 2016-10-01 DIAGNOSIS — Z96653 Presence of artificial knee joint, bilateral: Secondary | ICD-10-CM | POA: Diagnosis present

## 2016-10-01 DIAGNOSIS — Z882 Allergy status to sulfonamides status: Secondary | ICD-10-CM

## 2016-10-01 DIAGNOSIS — M25561 Pain in right knee: Secondary | ICD-10-CM | POA: Diagnosis present

## 2016-10-01 DIAGNOSIS — M069 Rheumatoid arthritis, unspecified: Secondary | ICD-10-CM | POA: Diagnosis present

## 2016-10-01 DIAGNOSIS — Z720 Tobacco use: Secondary | ICD-10-CM | POA: Diagnosis not present

## 2016-10-01 DIAGNOSIS — Z881 Allergy status to other antibiotic agents status: Secondary | ICD-10-CM | POA: Diagnosis not present

## 2016-10-01 DIAGNOSIS — Z6841 Body Mass Index (BMI) 40.0 and over, adult: Secondary | ICD-10-CM | POA: Diagnosis not present

## 2016-10-01 DIAGNOSIS — Z79899 Other long term (current) drug therapy: Secondary | ICD-10-CM | POA: Diagnosis not present

## 2016-10-01 DIAGNOSIS — J45909 Unspecified asthma, uncomplicated: Secondary | ICD-10-CM | POA: Diagnosis present

## 2016-10-01 DIAGNOSIS — G4730 Sleep apnea, unspecified: Secondary | ICD-10-CM | POA: Diagnosis present

## 2016-10-01 DIAGNOSIS — M0000 Staphylococcal arthritis, unspecified joint: Secondary | ICD-10-CM | POA: Diagnosis not present

## 2016-10-01 DIAGNOSIS — M009 Pyogenic arthritis, unspecified: Principal | ICD-10-CM | POA: Diagnosis present

## 2016-10-01 DIAGNOSIS — B957 Other staphylococcus as the cause of diseases classified elsewhere: Secondary | ICD-10-CM | POA: Diagnosis not present

## 2016-10-01 DIAGNOSIS — K219 Gastro-esophageal reflux disease without esophagitis: Secondary | ICD-10-CM | POA: Diagnosis present

## 2016-10-01 DIAGNOSIS — B9689 Other specified bacterial agents as the cause of diseases classified elsewhere: Secondary | ICD-10-CM | POA: Diagnosis not present

## 2016-10-01 DIAGNOSIS — Y792 Prosthetic and other implants, materials and accessory orthopedic devices associated with adverse incidents: Secondary | ICD-10-CM | POA: Diagnosis not present

## 2016-10-01 DIAGNOSIS — T8450XA Infection and inflammatory reaction due to unspecified internal joint prosthesis, initial encounter: Secondary | ICD-10-CM | POA: Diagnosis not present

## 2016-10-01 DIAGNOSIS — T8453XA Infection and inflammatory reaction due to internal right knee prosthesis, initial encounter: Secondary | ICD-10-CM | POA: Diagnosis not present

## 2016-10-01 DIAGNOSIS — G4733 Obstructive sleep apnea (adult) (pediatric): Secondary | ICD-10-CM | POA: Diagnosis present

## 2016-10-01 DIAGNOSIS — I10 Essential (primary) hypertension: Secondary | ICD-10-CM | POA: Diagnosis present

## 2016-10-01 DIAGNOSIS — M00861 Arthritis due to other bacteria, right knee: Secondary | ICD-10-CM | POA: Diagnosis not present

## 2016-10-01 DIAGNOSIS — Z96643 Presence of artificial hip joint, bilateral: Secondary | ICD-10-CM | POA: Diagnosis present

## 2016-10-01 HISTORY — DX: Pyogenic arthritis, unspecified: M00.9

## 2016-10-01 HISTORY — PX: I & D KNEE WITH POLY EXCHANGE: SHX5024

## 2016-10-01 LAB — BASIC METABOLIC PANEL
ANION GAP: 7 (ref 5–15)
BUN: 14 mg/dL (ref 6–20)
CALCIUM: 8.7 mg/dL — AB (ref 8.9–10.3)
CO2: 26 mmol/L (ref 22–32)
Chloride: 106 mmol/L (ref 101–111)
Creatinine, Ser: 0.95 mg/dL (ref 0.61–1.24)
GFR calc Af Amer: >60 mL/min (ref >60)
Glucose, Bld: 99 mg/dL (ref 65–99)
Potassium: 4.4 mmol/L (ref 3.5–5.1)
Sodium: 139 mmol/L (ref 135–145)

## 2016-10-01 LAB — CBC
HCT: 36.9 % — ABNORMAL LOW (ref 39.0–52.0)
HEMOGLOBIN: 12.3 g/dL — AB (ref 13.0–17.0)
MCH: 27.3 pg (ref 26.0–34.0)
MCHC: 33.3 g/dL (ref 30.0–36.0)
MCV: 82 fL (ref 78.0–100.0)
PLATELETS: 375 10*3/uL (ref 150–400)
RBC: 4.5 MIL/uL (ref 4.22–5.81)
RDW: 15.6 % — ABNORMAL HIGH (ref 11.5–15.5)
WBC: 9.6 10*3/uL (ref 4.0–10.5)

## 2016-10-01 LAB — PROTIME-INR
INR: 1.11
PROTHROMBIN TIME: 14.4 s (ref 11.4–15.2)

## 2016-10-01 LAB — APTT: APTT: 28 s (ref 24–36)

## 2016-10-01 SURGERY — IRRIGATION AND DEBRIDEMENT KNEE WITH POLY EXCHANGE
Anesthesia: General | Laterality: Right

## 2016-10-01 MED ORDER — SODIUM CHLORIDE 0.9 % IV SOLN
INTRAVENOUS | Status: DC
  Administered 2016-10-01: 20:00:00 via INTRAVENOUS

## 2016-10-01 MED ORDER — PROMETHAZINE HCL 25 MG/ML IJ SOLN: 6.2500 mg | INTRAMUSCULAR | Status: DC | PRN

## 2016-10-01 MED ORDER — LIDOCAINE 2% (20 MG/ML) 5 ML SYRINGE
INTRAMUSCULAR | Status: DC | PRN
  Administered 2016-10-01: 60 mg via INTRAVENOUS

## 2016-10-01 MED ORDER — ACETAMINOPHEN 500 MG PO TABS
1000.0000 mg | ORAL_TABLET | Freq: Four times a day (QID) | ORAL | Status: AC
Start: 2016-10-02 — End: 2016-10-02
  Administered 2016-10-01 – 2016-10-02 (×4): 1000 mg via ORAL
  Filled 2016-10-01 (×4): qty 2

## 2016-10-01 MED ORDER — MIDAZOLAM HCL 5 MG/5ML IJ SOLN
INTRAMUSCULAR | Status: DC | PRN
  Administered 2016-10-01: 2 mg via INTRAVENOUS

## 2016-10-01 MED ORDER — BUPIVACAINE LIPOSOME 1.3 % IJ SUSP
20.0000 mL | Freq: Once | INTRAMUSCULAR | Status: DC
  Filled 2016-10-01: qty 20

## 2016-10-01 MED ORDER — DEXAMETHASONE SODIUM PHOSPHATE 10 MG/ML IJ SOLN
INTRAMUSCULAR | Status: DC | PRN
  Administered 2016-10-01: 10 mg via INTRAVENOUS

## 2016-10-01 MED ORDER — ACETAMINOPHEN 10 MG/ML IV SOLN
1000.0000 mg | Freq: Once | INTRAVENOUS | Status: AC
  Administered 2016-10-01: 1000 mg via INTRAVENOUS

## 2016-10-01 MED ORDER — ONDANSETRON HCL 4 MG/2ML IJ SOLN
INTRAMUSCULAR | Status: DC | PRN
Start: 2016-10-01 — End: 2016-10-01
  Administered 2016-10-01: 4 mg via INTRAVENOUS

## 2016-10-01 MED ORDER — ONDANSETRON HCL 4 MG/2ML IJ SOLN
INTRAMUSCULAR | Status: AC
  Filled 2016-10-01: qty 2

## 2016-10-01 MED ORDER — DILTIAZEM HCL ER COATED BEADS 240 MG PO CP24
240.0000 mg | ORAL_CAPSULE | Freq: Every day | ORAL | Status: DC
  Administered 2016-10-02 – 2016-10-03 (×2): 240 mg via ORAL
  Filled 2016-10-01 (×3): qty 1

## 2016-10-01 MED ORDER — PROPOFOL 10 MG/ML IV BOLUS
INTRAVENOUS | Status: DC | PRN
Start: 2016-10-01 — End: 2016-10-01
  Administered 2016-10-01: 20 mg via INTRAVENOUS
  Administered 2016-10-01: 180 mg via INTRAVENOUS

## 2016-10-01 MED ORDER — DEXAMETHASONE SODIUM PHOSPHATE 10 MG/ML IJ SOLN
INTRAMUSCULAR | Status: AC
  Filled 2016-10-01: qty 1

## 2016-10-01 MED ORDER — LACTATED RINGERS IV SOLN
INTRAVENOUS | Status: DC
  Administered 2016-10-01 (×2): via INTRAVENOUS

## 2016-10-01 MED ORDER — FLEET ENEMA 7-19 GM/118ML RE ENEM: 1.0000 | ENEMA | Freq: Once | RECTAL | Status: DC | PRN

## 2016-10-01 MED ORDER — DIPHENHYDRAMINE HCL 12.5 MG/5ML PO ELIX: 12.5000 mg | ORAL_SOLUTION | ORAL | Status: DC | PRN

## 2016-10-01 MED ORDER — SODIUM CHLORIDE 0.9 % IR SOLN
Status: DC | PRN
  Administered 2016-10-01: 7000 mL

## 2016-10-01 MED ORDER — OXYCODONE HCL 5 MG PO TABS
5.0000 mg | ORAL_TABLET | ORAL | Status: DC | PRN
  Administered 2016-10-01 – 2016-10-03 (×4): 5 mg via ORAL
  Administered 2016-10-04: 05:00:00 10 mg via ORAL
  Filled 2016-10-01 (×2): qty 1
  Filled 2016-10-01 (×2): qty 2
  Filled 2016-10-01: qty 1
  Filled 2016-10-01: qty 2

## 2016-10-01 MED ORDER — CLINDAMYCIN PHOSPHATE 900 MG/50ML IV SOLN
900.0000 mg | Freq: Once | INTRAVENOUS | Status: AC
  Administered 2016-10-01: 900 mg via INTRAVENOUS

## 2016-10-01 MED ORDER — FUROSEMIDE 40 MG PO TABS
40.0000 mg | ORAL_TABLET | Freq: Every day | ORAL | Status: DC
  Administered 2016-10-02: 40 mg via ORAL
  Filled 2016-10-01 (×2): qty 1

## 2016-10-01 MED ORDER — CLINDAMYCIN PHOSPHATE 900 MG/50ML IV SOLN
INTRAVENOUS | Status: AC
  Filled 2016-10-01: qty 50

## 2016-10-01 MED ORDER — POLYETHYLENE GLYCOL 3350 17 G PO PACK: 17.0000 g | PACK | Freq: Every day | ORAL | Status: DC | PRN

## 2016-10-01 MED ORDER — ACETAMINOPHEN 10 MG/ML IV SOLN
INTRAVENOUS | Status: AC
  Filled 2016-10-01: qty 100

## 2016-10-01 MED ORDER — BISACODYL 10 MG RE SUPP: 10.0000 mg | Freq: Every day | RECTAL | Status: DC | PRN

## 2016-10-01 MED ORDER — APIXABAN 2.5 MG PO TABS
2.5000 mg | ORAL_TABLET | Freq: Two times a day (BID) | ORAL | Status: DC
  Administered 2016-10-02 – 2016-10-04 (×5): 2.5 mg via ORAL
  Filled 2016-10-01 (×5): qty 1

## 2016-10-01 MED ORDER — EPHEDRINE SULFATE-NACL 50-0.9 MG/10ML-% IV SOSY
PREFILLED_SYRINGE | INTRAVENOUS | Status: DC | PRN
  Administered 2016-10-01: 10 mg via INTRAVENOUS

## 2016-10-01 MED ORDER — DEXAMETHASONE SODIUM PHOSPHATE 10 MG/ML IJ SOLN
10.0000 mg | Freq: Once | INTRAMUSCULAR | Status: AC
  Administered 2016-10-02: 10:00:00 10 mg via INTRAVENOUS
  Filled 2016-10-01: qty 1

## 2016-10-01 MED ORDER — MOMETASONE FURO-FORMOTEROL FUM 200-5 MCG/ACT IN AERO
2.0000 | INHALATION_SPRAY | Freq: Two times a day (BID) | RESPIRATORY_TRACT | Status: DC | PRN
  Filled 2016-10-01: qty 8.8

## 2016-10-01 MED ORDER — ONDANSETRON HCL 4 MG PO TABS: 4.0000 mg | ORAL_TABLET | Freq: Four times a day (QID) | ORAL | Status: DC | PRN

## 2016-10-01 MED ORDER — FENTANYL CITRATE (PF) 100 MCG/2ML IJ SOLN
INTRAMUSCULAR | Status: DC | PRN
  Administered 2016-10-01 (×2): 50 ug via INTRAVENOUS

## 2016-10-01 MED ORDER — DOCUSATE SODIUM 100 MG PO CAPS
100.0000 mg | ORAL_CAPSULE | Freq: Two times a day (BID) | ORAL | Status: DC
  Administered 2016-10-01 – 2016-10-03 (×5): 100 mg via ORAL
  Filled 2016-10-01 (×5): qty 1

## 2016-10-01 MED ORDER — ONDANSETRON HCL 4 MG/2ML IJ SOLN: 4.0000 mg | Freq: Four times a day (QID) | INTRAMUSCULAR | Status: DC | PRN

## 2016-10-01 MED ORDER — SODIUM CHLORIDE 0.9 % IR SOLN
Status: DC | PRN
  Administered 2016-10-01: 1000 mL

## 2016-10-01 MED ORDER — METOCLOPRAMIDE HCL 5 MG/ML IJ SOLN: 5.0000 mg | Freq: Three times a day (TID) | INTRAMUSCULAR | Status: DC | PRN

## 2016-10-01 MED ORDER — PANTOPRAZOLE SODIUM 40 MG PO TBEC
40.0000 mg | DELAYED_RELEASE_TABLET | Freq: Every day | ORAL | Status: DC
  Administered 2016-10-02 – 2016-10-03 (×2): 40 mg via ORAL
  Filled 2016-10-01 (×2): qty 1

## 2016-10-01 MED ORDER — VANCOMYCIN HCL IN DEXTROSE 1-5 GM/200ML-% IV SOLN: 1000.0000 mg | Freq: Once | INTRAVENOUS | Status: DC

## 2016-10-01 MED ORDER — HYDROMORPHONE HCL 1 MG/ML IJ SOLN: 0.2500 mg | INTRAMUSCULAR | Status: DC | PRN

## 2016-10-01 MED ORDER — METOPROLOL SUCCINATE ER 50 MG PO TB24
50.0000 mg | ORAL_TABLET | Freq: Every day | ORAL | Status: DC
  Administered 2016-10-02 – 2016-10-03 (×2): 50 mg via ORAL
  Filled 2016-10-01 (×2): qty 1

## 2016-10-01 MED ORDER — LOSARTAN POTASSIUM 50 MG PO TABS
100.0000 mg | ORAL_TABLET | Freq: Every day | ORAL | Status: DC
  Administered 2016-10-02 – 2016-10-03 (×2): 100 mg via ORAL
  Filled 2016-10-01 (×2): qty 2

## 2016-10-01 MED ORDER — MORPHINE SULFATE (PF) 2 MG/ML IV SOLN: 1.0000 mg | INTRAVENOUS | Status: DC | PRN

## 2016-10-01 MED ORDER — METOCLOPRAMIDE HCL 5 MG PO TABS: 5.0000 mg | ORAL_TABLET | Freq: Three times a day (TID) | ORAL | Status: DC | PRN

## 2016-10-01 MED ORDER — FENTANYL CITRATE (PF) 100 MCG/2ML IJ SOLN
INTRAMUSCULAR | Status: AC
  Filled 2016-10-01: qty 4

## 2016-10-01 MED ORDER — PHENOL 1.4 % MT LIQD
1.0000 | OROMUCOSAL | Status: DC | PRN
Start: 2016-10-01 — End: 2016-10-04

## 2016-10-01 MED ORDER — CHLORHEXIDINE GLUCONATE 4 % EX LIQD: 60.0000 mL | Freq: Once | CUTANEOUS | Status: DC

## 2016-10-01 MED ORDER — SUCCINYLCHOLINE CHLORIDE 200 MG/10ML IV SOSY
PREFILLED_SYRINGE | INTRAVENOUS | Status: DC | PRN
Start: 2016-10-01 — End: 2016-10-01
  Administered 2016-10-01: 140 mg via INTRAVENOUS

## 2016-10-01 MED ORDER — METHOCARBAMOL 500 MG PO TABS
500.0000 mg | ORAL_TABLET | Freq: Four times a day (QID) | ORAL | Status: DC | PRN
  Administered 2016-10-02 – 2016-10-04 (×4): 500 mg via ORAL
  Filled 2016-10-01 (×4): qty 1

## 2016-10-01 MED ORDER — DEXAMETHASONE SODIUM PHOSPHATE 10 MG/ML IJ SOLN: 10.0000 mg | Freq: Once | INTRAMUSCULAR | Status: DC

## 2016-10-01 MED ORDER — MIDAZOLAM HCL 2 MG/2ML IJ SOLN
INTRAMUSCULAR | Status: AC
  Filled 2016-10-01: qty 2

## 2016-10-01 MED ORDER — VANCOMYCIN HCL IN DEXTROSE 1-5 GM/200ML-% IV SOLN
INTRAVENOUS | Status: AC
  Filled 2016-10-01: qty 200

## 2016-10-01 MED ORDER — MENTHOL 3 MG MT LOZG: 1.0000 | LOZENGE | OROMUCOSAL | Status: DC | PRN

## 2016-10-01 MED ORDER — DEXTROSE 5 % IV SOLN
500.0000 mg | Freq: Four times a day (QID) | INTRAVENOUS | Status: DC | PRN
  Administered 2016-10-01: 500 mg via INTRAVENOUS
  Filled 2016-10-01: qty 5
  Filled 2016-10-01: qty 550

## 2016-10-01 MED ORDER — SODIUM CHLORIDE 0.9 % IV SOLN
6.0000 mg/kg | INTRAVENOUS | Status: DC
  Administered 2016-10-01 – 2016-10-03 (×3): 825.5 mg via INTRAVENOUS
  Filled 2016-10-01 (×3): qty 16.51

## 2016-10-01 SURGICAL SUPPLY — 43 items
BAG SPEC THK2 15X12 ZIP CLS (MISCELLANEOUS)
BAG ZIPLOCK 12X15 (MISCELLANEOUS) ×1 IMPLANT
BANDAGE ACE 6X5 VEL STRL LF (GAUZE/BANDAGES/DRESSINGS) ×2 IMPLANT
CLOTH BEACON ORANGE TIMEOUT ST (SAFETY) ×2 IMPLANT
CUFF TOURN SGL QUICK 34 (TOURNIQUET CUFF) ×2
CUFF TRNQT CYL 34X4X40X1 (TOURNIQUET CUFF) ×1 IMPLANT
DRAPE U-SHAPE 47X51 STRL (DRAPES) ×2 IMPLANT
DRSG ADAPTIC 3X8 NADH LF (GAUZE/BANDAGES/DRESSINGS) ×2 IMPLANT
DRSG PAD ABDOMINAL 8X10 ST (GAUZE/BANDAGES/DRESSINGS) ×5 IMPLANT
DURAPREP 26ML APPLICATOR (WOUND CARE) ×3 IMPLANT
ELECT REM PT RETURN 9FT ADLT (ELECTROSURGICAL) ×2
ELECTRODE REM PT RTRN 9FT ADLT (ELECTROSURGICAL) ×1 IMPLANT
EVACUATOR 1/8 PVC DRAIN (DRAIN) ×2 IMPLANT
GAUZE SPONGE 4X4 12PLY STRL (GAUZE/BANDAGES/DRESSINGS) ×2 IMPLANT
GLOVE BIO SURGEON STRL SZ7.5 (GLOVE) ×6 IMPLANT
GLOVE BIO SURGEON STRL SZ8 (GLOVE) ×2 IMPLANT
GLOVE BIOGEL PI IND STRL 8 (GLOVE) ×1 IMPLANT
GLOVE BIOGEL PI INDICATOR 8 (GLOVE) ×1
GOWN STRL REUS W/TWL LRG LVL3 (GOWN DISPOSABLE) ×4 IMPLANT
GOWN STRL REUS W/TWL XL LVL3 (GOWN DISPOSABLE) ×2 IMPLANT
HANDPIECE INTERPULSE COAX TIP (DISPOSABLE) ×2
IMMOBILIZER KNEE 20 (SOFTGOODS) ×2
IMMOBILIZER KNEE 20 THIGH 36 (SOFTGOODS) ×1 IMPLANT
INSERT TIBIAL RC3 RP SZ 4 30.0 (Insert) ×1 IMPLANT
MANIFOLD NEPTUNE II (INSTRUMENTS) ×2 IMPLANT
NS IRRIG 1000ML POUR BTL (IV SOLUTION) ×2 IMPLANT
PACK TOTAL KNEE CUSTOM (KITS) ×2 IMPLANT
PADDING CAST COTTON 6X4 STRL (CAST SUPPLIES) ×5 IMPLANT
POSITIONER SURGICAL ARM (MISCELLANEOUS) ×2 IMPLANT
SET HNDPC FAN SPRY TIP SCT (DISPOSABLE) ×1 IMPLANT
SPONGE LAP 18X18 X RAY DECT (DISPOSABLE) ×2 IMPLANT
STAPLER VISISTAT 35W (STAPLE) ×2 IMPLANT
STRIP CLOSURE SKIN 1/2X4 (GAUZE/BANDAGES/DRESSINGS) ×4 IMPLANT
SUT MNCRL AB 4-0 PS2 18 (SUTURE) ×2 IMPLANT
SUT PDS AB 1 CT1 27 (SUTURE) ×4 IMPLANT
SUT VIC AB 2-0 CT1 27 (SUTURE) ×6
SUT VIC AB 2-0 CT1 TAPERPNT 27 (SUTURE) ×3 IMPLANT
SUT VLOC 180 0 24IN GS25 (SUTURE) IMPLANT
SWAB COLLECTION DEVICE MRSA (MISCELLANEOUS) ×2 IMPLANT
SWAB CULTURE ESWAB REG 1ML (MISCELLANEOUS) ×2 IMPLANT
TRAY FOLEY W/METER SILVER 16FR (SET/KITS/TRAYS/PACK) ×2 IMPLANT
WATER STERILE IRR 1500ML POUR (IV SOLUTION) ×1 IMPLANT
WRAP KNEE MAXI GEL POST OP (GAUZE/BANDAGES/DRESSINGS) ×4 IMPLANT

## 2016-10-01 NOTE — Transfer of Care (Signed)
Immediate Anesthesia Transfer of Care Note  Patient: [name]  Procedure(s) Performed: Procedure(s): IRRIGATION AND DEBRIDEMENT KNEE WITH POLY EXCHANGE (Right)  Patient Location: PACU  Anesthesia Type:General  Level of Consciousness:  sedated, patient cooperative and responds to stimulation  Airway & Oxygen Therapy:Patient Spontanous Breathing and Patient connected to face mask oxgen  Post-op Assessment:  Report given to PACU RN and Post -op Vital signs reviewed and stable  Post vital signs:  Reviewed and stable  Last Vitals:  Vitals:   10/01/16 1502  BP: (!) 145/88  Pulse: 65  Resp: 18  Temp: 36.9 C    Complications: No apparent anesthesia complications

## 2016-10-01 NOTE — Anesthesia Procedure Notes (Signed)
Procedure Name: Intubation Date/Time: 10/01/2016 6:29 PM Performed by: Thornell Mule Pre-anesthesia Checklist: Patient identified, Emergency Drugs available, Suction available and Patient being monitored Patient Re-evaluated:Patient Re-evaluated prior to inductionOxygen Delivery Method: Circle system utilized Preoxygenation: Pre-oxygenation with 100% oxygen Intubation Type: IV induction Ventilation: Mask ventilation without difficulty Laryngoscope Size: Miller and 3 Grade View: Grade I Tube type: Oral Tube size: 8.0 mm Number of attempts: 1 Airway Equipment and Method: Stylet and Oral airway Placement Confirmation: ETT inserted through vocal cords under direct vision,  positive ETCO2 and breath sounds checked- equal and bilateral Secured at: 21 cm Tube secured with: Tape Dental Injury: Teeth and Oropharynx as per pre-operative assessment

## 2016-10-01 NOTE — Anesthesia Preprocedure Evaluation (Addendum)
Anesthesia Evaluation  Patient identified by MRN, date of birth, ID band Patient awake    Reviewed: Allergy & Precautions, NPO status , Patient's Chart, lab work & pertinent test results  Airway Mallampati: II  TM Distance: >3 FB Neck ROM: Full    Dental no notable dental hx.    Pulmonary sleep apnea , former smoker,    Pulmonary exam normal breath sounds clear to auscultation       Cardiovascular hypertension, Normal cardiovascular exam Rhythm:Regular Rate:Normal     Neuro/Psych negative neurological ROS  negative psych ROS   GI/Hepatic negative GI ROS, Neg liver ROS,   Endo/Other  Morbid obesity  Renal/GU negative Renal ROS  negative genitourinary   Musculoskeletal  (+) Arthritis , Rheumatoid disorders,    Abdominal   Peds negative pediatric ROS (+)  Hematology negative hematology ROS (+)   Anesthesia Other Findings   Reproductive/Obstetrics negative OB ROS                             Anesthesia Physical Anesthesia Plan  ASA: III  Anesthesia Plan: General   Post-op Pain Management:    Induction: Intravenous  Airway Management Planned: Oral ETT  Additional Equipment:   Intra-op Plan:   Post-operative Plan: Extubation in OR  Informed Consent: I have reviewed the patients History and Physical, chart, labs and discussed the procedure including the risks, benefits and alternatives for the proposed anesthesia with the patient or authorized representative who has indicated his/her understanding and acceptance.   Dental advisory given  Plan Discussed with: CRNA and Surgeon  Anesthesia Plan Comments:         Anesthesia Quick Evaluation

## 2016-10-01 NOTE — Anesthesia Postprocedure Evaluation (Signed)
Anesthesia Post Note  Patient: [name]  Procedure(s) Performed: Procedure(s) (LRB): IRRIGATION AND DEBRIDEMENT KNEE WITH POLY EXCHANGE (Right)  Patient location during evaluation: PACU Anesthesia Type: General Level of consciousness: awake and alert Pain management: pain level controlled Vital Signs Assessment: post-procedure vital signs reviewed and stable Respiratory status: spontaneous breathing, nonlabored ventilation, respiratory function stable and patient connected to nasal cannula oxygen Cardiovascular status: blood pressure returned to baseline and stable Postop Assessment: no signs of nausea or vomiting Anesthetic complications: no       Last Vitals:  Vitals:   10/01/16 1945 10/01/16 2015  BP:  (!) 154/86  Pulse:    Resp: 12   Temp:      Last Pain:  Vitals:   10/01/16 1502  TempSrc: Oral                 [name]

## 2016-10-01 NOTE — Brief Op Note (Signed)
10/01/2016  7:24 PM  PATIENT:  [name]  55 y.o. male  PRE-OPERATIVE DIAGNOSIS:  Right knee staph infection  POST-OPERATIVE DIAGNOSIS:  right knee infection  PROCEDURE:  Procedure(s): IRRIGATION AND DEBRIDEMENT KNEE WITH POLY EXCHANGE (Right)  SURGEON:  Surgeon(s) and Role:    * Ollen Gross, MD - Primary  PHYSICIAN ASSISTANT:   ASSISTANTS: Avel Peace, PA-C   ANESTHESIA:   general  EBL:  No intake/output data recorded.  BLOOD ADMINISTERED:none  DRAINS: (Medium) Hemovact drain(s) in the right knee with  Suction Open   LOCAL MEDICATIONS USED:  NONE  COUNTS:  YES  TOURNIQUET:   Total Tourniquet Time Documented: Thigh (Right) - 26 minutes Total: Thigh (Right) - 26 minutes   DICTATION: .Other Dictation: Dictation Number [id number]  PLAN OF CARE: Admit to inpatient   PATIENT DISPOSITION:  PACU - hemodynamically stable.

## 2016-10-01 NOTE — Progress Notes (Signed)
Pharmacy Antibiotic Note  [name] is a 55 y.o. male with hx RA, recurrent cellulitis, and previous prosthetic joint infection with multiple prior knee surgeries who developed right knee pain and swelling for several days admitted on 10/01/2016 for I/D with polyexchange.  Pharmacy has been consulted for daptomycin dosing for recurrent septic arthritis.    Pt has allergies to avelox (hives, SOB, itching), CTX (hives, itching), sulfa (itching), vancomycin (hives, itching), penicillins (rash), and erythromycin (rash).    CrCl > 100 ml/min  Plan: Check baseline CK in AM  Daptomycin 6mg /kg (TBW) q24h F/u weekly CK or sooner if renal insufficiency or elevations in CK F/u muscle pain, weakness, or signs and symptoms of peripheral neuropathy during use F/u renal function F/u cultures  Height: 5\' 8"  (172.7 cm) Weight: (!) 303 lb 6.4 oz (137.6 kg) IBW/kg (Calculated) : 68.4  Temp (24hrs), Avg:98.2 F (36.8 C), Min:97.5 F (36.4 C), Max:98.5 F (36.9 C)   Recent Labs Lab 10/01/16 1545  WBC 9.6  CREATININE 0.95    Estimated Creatinine Clearance: 119.4 mL/min (by C-G formula based on SCr of 0.95 mg/dL).    Allergies  Allergen Reactions  . Avelox [Moxifloxacin Hcl In Nacl] Hives, Shortness Of Breath and Itching  . Doxycycline Itching  . Rocephin [Ceftriaxone Sodium In Dextrose] Hives and Itching  . Sulfa Antibiotics Itching  . Vancomycin Hives and Itching  . Erythrocin Rash  . Penicillins Rash    Has patient had a PCN reaction causing immediate rash, facial/tongue/throat swelling, SOB or lightheadedness with hypotension: Yes Has patient had a PCN reaction causing severe rash involving mucus membranes or skin necrosis: No Has patient had a PCN reaction that required hospitalization No Has patient had a PCN reaction occurring within the last 10 years: No If all of the above answers are "NO", then may proceed with Cephalosporin use.  . Sulfasalazine Rash    Antimicrobials this  admission: 12/18 Clindamycin pre-op 12/18 Daptomycin >>   Dose adjustments this admission:  Microbiology results: 12/18 synovial fluid: collected  Thank you for allowing pharmacy to be a part of this patient's care.  1/19, PharmD, BCPS 10/01/2016, 9:04 PM  Pager: [phone]

## 2016-10-01 NOTE — H&P (Signed)
[name] is an 55 y.o. male.   Chief Complaint: Right knee pain and swelling HPI: 55 yo male with complex history regarding his right knee who developed acute onset of pain and swelling approximately 5 days ago, without fever chills or associated systemic symptoms. Was seen in our office and had a left knee aspiration which was initially negative but then culture showed staph species. Given history of septic arthritis with those findings he is admitted and going to be taken to surgery tonight for irrigation and debridement and polyethylene exchange.  Past Medical History:  Diagnosis Date  . Asthma   . Cellulitis of lower extremity    "usually RLL; this time it's got up to my lower abdoment" (02/11/2014)-series of antibiotics completed 02-28-14  . Diverticulosis   . GERD (gastroesophageal reflux disease)   . Hypertension   . Infection of right knee ([place])   . OSA (obstructive sleep apnea)    "quit wearing mask several years ago" (02/11/2014)  . PONV (postoperative nausea and vomiting)   . Rheumatoid arthritis ([place]) dx'd ~ 1977  . S/P PICC central line placement 03-16-14   right upper arm remains intact.05-11-14 removed 1 week ago.    Past Surgical History:  Procedure Laterality Date  . EXCISIONAL HEMORRHOIDECTOMY    . EXCISIONAL TOTAL KNEE ARTHROPLASTY WITH ANTIBIOTIC SPACERS Right 03/18/2014   Procedure: RIGHT KNEE RESECTION ARTHROPLASTY WITH ANTIBIOTIC SPACERS;  Surgeon: [name], MD;  Location: [facility];  Service: Orthopedics;  Laterality: Right;  . FOREIGN BODY REMOVAL Left    "BB" removal above left eye-teen yrs.  . I&D KNEE WITH POLY EXCHANGE Right 04/25/2016   Procedure: RIGHT KNEE IRRIGATION AND DEBRIDEMENT WITH POLY EXCHANGE;  Surgeon: Gaynelle Arabian, MD;  Location: [facility];  Service: Orthopedics;  Laterality: Right;  . INCISION AND DRAINAGE Right 09/28/2014   Procedure: INCISION AND DRAINAGE RIGHT KNEE;  Surgeon: [name], MD;  Location: [facility];  Service: Orthopedics;   Laterality: Right;  . picc line placement Right    right upper arm  . 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 REVISION TOTAL KNEE ARTHROPLASTY Right 2012  . SPIGELIAN HERNIA Right 12/04/2015   Procedure: INCARCERATED SPIGELIAN HERNIA REPAIR WITH MESH;  Surgeon: Georganna Skeans, MD;  Location: [facility];  Service: General;  Laterality: Right;  . TOTAL KNEE ARTHROPLASTY Right 05/21/2014   Procedure: RIGHT KNEE ARTHROPLASTY REINPLANTATION;  Surgeon: [name], MD;  Location: [facility];  Service: Orthopedics;  Laterality: Right;    Family History  Problem Relation Age of Onset  . Colon cancer Mother   . Hypertension Mother   . Diabetes Mellitus II Paternal Grandmother    Social History:  reports that he quit smoking about 35 years ago. His smoking use included Cigarettes. He has a 3.00 pack-year smoking history. His smokeless tobacco use includes Snuff. He reports that he drinks alcohol. He reports that he does not use drugs.  Allergies:  Allergies  Allergen Reactions  . Avelox [Moxifloxacin Hcl In Nacl] Hives, Shortness Of Breath and Itching  . Doxycycline Itching  . Rocephin [Ceftriaxone Sodium In Dextrose] Hives and Itching  . Sulfa Antibiotics Itching  . Vancomycin Hives and Itching  . Erythrocin Rash  . Penicillins Rash    Has patient had a PCN reaction causing immediate rash, facial/tongue/throat swelling, SOB or lightheadedness with hypotension: Yes Has patient had  a PCN reaction causing severe rash involving mucus membranes or skin necrosis: No Has patient had a PCN reaction that required hospitalization No Has patient had a PCN reaction occurring within the last 10 years: No If all of the above answers are "NO", then may proceed with Cephalosporin use.  . Sulfasalazine Rash    Medications Prior to Admission  Medication Sig Dispense Refill  .  acetaminophen (TYLENOL) 325 MG tablet Take 2 tablets (650 mg total) by mouth every 6 (six) hours as needed for mild pain, moderate pain, fever or headache.    . diltiazem (CARDIZEM CD) 240 MG 24 hr capsule Take 240 mg by mouth daily.     . folic acid (FOLVITE) 1 MG tablet Take 1 mg by mouth daily.    . furosemide (LASIX) 40 MG tablet Take 40 mg by mouth daily.     . hydroxychloroquine (PLAQUENIL) 200 MG tablet Take 400 mg by mouth daily.    Marland Kitchen losartan (COZAAR) 100 MG tablet Take 100 mg by mouth daily.     . methocarbamol (ROBAXIN) 500 MG tablet Take 1 tablet (500 mg total) by mouth every 6 (six) hours as needed for muscle spasms. 90 tablet 0  . methotrexate 50 MG/2ML injection Inject 15 mg into the skin once a week. Sundays. Injects 17m/0.6ml.    . metoprolol (TOPROL-XL) 50 MG 24 hr tablet Take 50 mg by mouth daily.     . RABEprazole (ACIPHEX) 20 MG tablet Take 20 mg by mouth daily.    . traMADol (ULTRAM) 50 MG tablet Take 1-2 tablets (50-100 mg total) by mouth every 6 (six) hours as needed (mild pain). 80 tablet 1  . Fluticasone-Salmeterol (ADVAIR) 250-50 MCG/DOSE AEPB Inhale 1 puff into the lungs 2 (two) times daily as needed (wheezing).    .Marland KitchenoxyCODONE (OXY IR/ROXICODONE) 5 MG immediate release tablet Take 5 mg by mouth every 4 (four) hours as needed for severe pain.      Results for orders placed or performed during the hospital encounter of 10/01/16 (from the past 48 hour(s))  APTT     Status: None   Collection Time: 10/01/16  3:45 PM  Result Value Ref Range   aPTT 28 24 - 36 seconds  Basic metabolic panel     Status: Abnormal   Collection Time: 10/01/16  3:45 PM  Result Value Ref Range   Sodium 139 135 - 145 mmol/L   Potassium 4.4 3.5 - 5.1 mmol/L   Chloride 106 101 - 111 mmol/L   CO2 26 22 - 32 mmol/L   Glucose, Bld 99 65 - 99 mg/dL   BUN 14 6 - 20 mg/dL   Creatinine, Ser 0.95 0.61 - 1.24 mg/dL   Calcium 8.7 (L) 8.9 - 10.3 mg/dL   GFR calc non Af Amer >60 >60 mL/min   GFR calc  Af Amer >60 >60 mL/min    Comment: (NOTE) The eGFR has been calculated using the CKD EPI equation. This calculation has not been validated in all clinical situations. eGFR's persistently <60 mL/min signify possible Chronic Kidney Disease.    Anion gap 7 5 - 15  CBC     Status: Abnormal   Collection Time: 10/01/16  3:45 PM  Result Value Ref Range   WBC 9.6 4.0 - 10.5 K/uL   RBC 4.50 4.22 - 5.81 MIL/uL   Hemoglobin 12.3 (L) 13.0 - 17.0 g/dL   HCT 36.9 (L) 39.0 - 52.0 %   MCV 82.0 78.0 - 100.0 fL  MCH 27.3 26.0 - 34.0 pg   MCHC 33.3 30.0 - 36.0 g/dL   RDW 15.6 (H) 11.5 - 15.5 %   Platelets 375 150 - 400 K/uL  Protime-INR     Status: None   Collection Time: 10/01/16  3:45 PM  Result Value Ref Range   Prothrombin Time 14.4 11.4 - 15.2 seconds   INR 1.11    No results found.  ROS  Blood pressure (!) 145/88, pulse 65, temperature 98.4 F (36.9 C), temperature source Oral, resp. rate 18, height _0  (1.727 m), weight (!) 137.6 kg (303 lb 6.4 oz), SpO2 99 %. Physical Exam Physical Examination: General appearance - alert, well appearing, and in no distress Mental status - alert, oriented to person, place, and time Chest - clear to auscultation, no wheezes, rales or rhonchi, symmetric air entry Heart - normal rate, regular rhythm, normal S1, S2, no murmurs, rubs, clicks or gallops Abdomen - soft, nontender, nondistended, no masses or organomegaly Neurological - alert, oriented, normal speech, no focal findings or movement disorder noted Right knee with small effusion and mild warmth. Discomfort on range of motion, No tenderness. No instability   Assessment/Plan Right knee septic arthritis- Plan irrigation and debridement with polyethylene exchange. Discussed in detail with patient who elects to proceed. Options are very limited as he already had a 2 stage revision and has very little bone stock remaining for any future surgeries.  [name], MD 10/01/2016, 4:44 PM

## 2016-10-02 ENCOUNTER — Encounter (HOSPITAL_COMMUNITY): Payer: Self-pay | Admitting: Orthopedic Surgery

## 2016-10-02 DIAGNOSIS — Z833 Family history of diabetes mellitus: Secondary | ICD-10-CM

## 2016-10-02 DIAGNOSIS — Z888 Allergy status to other drugs, medicaments and biological substances status: Secondary | ICD-10-CM

## 2016-10-02 DIAGNOSIS — Y792 Prosthetic and other implants, materials and accessory orthopedic devices associated with adverse incidents: Secondary | ICD-10-CM

## 2016-10-02 DIAGNOSIS — B9689 Other specified bacterial agents as the cause of diseases classified elsewhere: Secondary | ICD-10-CM

## 2016-10-02 DIAGNOSIS — M00861 Arthritis due to other bacteria, right knee: Secondary | ICD-10-CM

## 2016-10-02 DIAGNOSIS — Z800 Family history of malignant neoplasm of digestive organs: Secondary | ICD-10-CM

## 2016-10-02 DIAGNOSIS — Z95828 Presence of other vascular implants and grafts: Secondary | ICD-10-CM

## 2016-10-02 DIAGNOSIS — T8453XA Infection and inflammatory reaction due to internal right knee prosthesis, initial encounter: Secondary | ICD-10-CM

## 2016-10-02 DIAGNOSIS — Z881 Allergy status to other antibiotic agents status: Secondary | ICD-10-CM

## 2016-10-02 DIAGNOSIS — Z8619 Personal history of other infectious and parasitic diseases: Secondary | ICD-10-CM

## 2016-10-02 DIAGNOSIS — Z882 Allergy status to sulfonamides status: Secondary | ICD-10-CM

## 2016-10-02 DIAGNOSIS — Z8249 Family history of ischemic heart disease and other diseases of the circulatory system: Secondary | ICD-10-CM

## 2016-10-02 DIAGNOSIS — Z87891 Personal history of nicotine dependence: Secondary | ICD-10-CM

## 2016-10-02 LAB — BASIC METABOLIC PANEL
Anion gap: 7 (ref 5–15)
BUN: 14 mg/dL (ref 6–20)
CHLORIDE: 107 mmol/L (ref 101–111)
CO2: 24 mmol/L (ref 22–32)
Calcium: 8.4 mg/dL — ABNORMAL LOW (ref 8.9–10.3)
Creatinine, Ser: 0.84 mg/dL (ref 0.61–1.24)
GFR calc non Af Amer: >60 mL/min (ref >60)
Glucose, Bld: 153 mg/dL — ABNORMAL HIGH (ref 65–99)
POTASSIUM: 4.8 mmol/L (ref 3.5–5.1)
SODIUM: 138 mmol/L (ref 135–145)

## 2016-10-02 LAB — CBC
HEMATOCRIT: 35.9 % — AB (ref 39.0–52.0)
HEMOGLOBIN: 11.4 g/dL — AB (ref 13.0–17.0)
MCH: 26.6 pg (ref 26.0–34.0)
MCHC: 31.8 g/dL (ref 30.0–36.0)
MCV: 83.7 fL (ref 78.0–100.0)
Platelets: 385 10*3/uL (ref 150–400)
RBC: 4.29 MIL/uL (ref 4.22–5.81)
RDW: 15.8 % — ABNORMAL HIGH (ref 11.5–15.5)
WBC: 6.5 10*3/uL (ref 4.0–10.5)

## 2016-10-02 LAB — CK: Total CK: 86 U/L (ref 49–397)

## 2016-10-02 MED ORDER — SODIUM CHLORIDE 0.9% FLUSH
10.0000 mL | INTRAVENOUS | Status: DC | PRN
  Administered 2016-10-04: 04:00:00 10 mL
  Filled 2016-10-02: qty 40

## 2016-10-02 NOTE — Care Management Note (Signed)
Case Management Note  Patient Details  Name: [name] MRN: [id number] Date of Birth: Jan 31, 1961  Subjective/Objective:                  IRRIGATION AND DEBRIDEMENT KNEE WITH POLY EXCHANGE (Right) Action/Plan: Discharge planning Expected Discharge Date:  10/03/16               Expected Discharge Plan:  [place]  In-House Referral:     Discharge planning Services  CM Consult  Post Acute Care Choice:  Home Health Choice offered to:  Patient  DME Arranged:  IV pump/equipment DME Agency:  Other - Comment  HH Arranged:  RN [place] Agency:  Other - See comment  Status of Service:  In process, will continue to follow  If discussed at Long Length of Stay Meetings, dates discussed:    Additional Comments: CM met with pt and wife, [name], in room to offer choice of home health agency.  Family choose [facility] to provide pharmacy and [facility] services.  [name] has already reached out to [place] [phone], has spoken with Marcello Moores and Marcello Moores has requested CM fax Home Health orders and prescription to [phone] (Attn: Marcello Moores).  CM has called and confirmed numbers, has requested Sheppard Penton, RN for management of PICC line and management of home IV ABX.  CM is waiting for ID to determine ABX and prescription.  CM will continue to follow and fax once prescription has been written. Dellie Catholic, RN 10/02/2016, 12:10 PM

## 2016-10-02 NOTE — Op Note (Deleted)
  The note originally documented on this encounter has been moved the the encounter in which it belongs.  

## 2016-10-02 NOTE — Progress Notes (Signed)
   Subjective: 1 Day Post-Op Procedure(s) (LRB): IRRIGATION AND DEBRIDEMENT KNEE WITH POLY EXCHANGE (Right) Patient reports pain as mild and moderate.   Patient seen in rounds by Dr. Lequita Halt. Patient is well, but has had some minor complaints of pain in the knee, requiring pain medications We will start therapy today.  Plan is to go Home after hospital stay.  PICC line ordered today.  Plan is for IV ABX via PICC. I.D. Consulted today.  Objective: Vital signs in last 24 hours: Temp:  [97.4 F (36.3 C)-98.5 F (36.9 C)] 97.7 F (36.5 C) (12/19 1602) Pulse Rate:  [60-77] 66 (12/19 1602) Resp:  [16-20] 18 (12/19 1602) BP: (139-152)/(66-83) 140/66 (12/19 1602) SpO2:  [97 %-100 %] 98 % (12/19 1602)  Intake/Output from previous day: 12/18 0701 - 12/19 0700 In: 1120 [P.O.:120; I.V.:1000] Out: 955 [Urine:870; Drains:85] Intake/Output this shift: No intake/output data recorded.   Recent Labs  10/01/16 1545 10/02/16 0400  HGB 12.3* 11.4*    Recent Labs  10/01/16 1545 10/02/16 0400  WBC 9.6 6.5  RBC 4.50 4.29  HCT 36.9* 35.9*  PLT 375 385    Recent Labs  10/01/16 1545 10/02/16 0400  NA 139 138  K 4.4 4.8  CL 106 107  CO2 26 24  BUN 14 14  CREATININE 0.95 0.84  GLUCOSE 99 153*  CALCIUM 8.7* 8.4*    Recent Labs  10/01/16 1545  INR 1.11    EXAM General - Patient is Alert and Appropriate Extremity - Neurovascular intact Sensation intact distally Dressing - dressing C/D/I Motor Function - intact, moving foot and toes well on exam.  Hemovac patent  Past Medical History:  Diagnosis Date  . Asthma   . Cellulitis of lower extremity    "usually RLL; this time it's got up to my lower abdoment" (02/11/2014)-series of antibiotics completed 02-28-14  . Diverticulosis   . GERD (gastroesophageal reflux disease)   . Hypertension   . Infection of right knee ([facility])   . OSA (obstructive sleep apnea)    "quit wearing mask several years ago" (02/11/2014)  . PONV  (postoperative nausea and vomiting)   . Rheumatoid arthritis ([facility]) dx'd ~ 1977  . S/P PICC central line placement 03-16-14   right upper arm remains intact.05-11-14 removed 1 week ago.    Assessment/Plan: 1 Day Post-Op Procedure(s) (LRB): IRRIGATION AND DEBRIDEMENT KNEE WITH POLY EXCHANGE (Right) Active Problems:   Septic arthritis of knee, right ([facility])  Estimated body mass index is 46.13 kg/m as calculated from the following:   Height as of this encounter: 5\' 8"  (1.727 m).   Weight as of this encounter: 137.6 kg (303 lb 6.4 oz).  DVT Prophylaxis - Eliquis Weight-Bearing as tolerated to right leg  , PA-C Orthopaedic Surgery 10/02/2016, 8:26 PM

## 2016-10-02 NOTE — Op Note (Signed)
[name], [name]                 ACCOUNT NO.:  [id number]  MEDICAL RECORD NO.:  [id number]  LOCATION:                                 FACILITY:  PHYSICIAN:  Ollen Gross, M.D.    DATE OF BIRTH:  10-28-1960  DATE OF PROCEDURE:  10/01/2016 DATE OF DISCHARGE:                              OPERATIVE REPORT   PREOPERATIVE DIAGNOSIS:  Septic arthritis, right knee.  POSTOPERATIVE DIAGNOSIS:  Septic arthritis, right knee.  PROCEDURE:  Irrigation and debridement, right knee with polyethylene exchange.  SURGEON:  Ollen Gross, M.D.  ASSISTANT:  Alexzandrew L. Perkins, PA-C.  ANESTHESIA:  General.  ESTIMATED BLOOD LOSS:  Minimal.  DRAINS:  Hemovac x1.  TOURNIQUET TIME:  26 minutes at 300 mmHg.  COMPLICATIONS:  None.  CONDITION:  Stable to recovery.  BRIEF CLINICAL NOTE:  [name] is a 55 year old male with long complex history in regard to his right knee.  He has had previous infection treated with a two-stage revision.  He did well initially, then last year had a recurrent infection treated with irrigation, debridement and polyethylene exchange.  He had antibiotics for about 6 weeks.  He did well until acute onset of swelling in his leg last week with knee pain. Aspiration done, which initially was equivocal, but then Gram stain eventually grew staphylococcal species.  He presents here today for irrigation, debridement and polyethylene exchange.  Given the complexity of his knee, I felt that a resection arthroplasty would not be an option as he barely has any bone stalk left from previous revision.  We are hoping to be able to control and eradicate the infection with the washout.  All possibilities were discussed with the patient, who elects to proceed in this manner.  PROCEDURE IN DETAIL:  After successful administration of general anesthetic, a tourniquet was placed on his right thigh.  Right lower extremity prepped and draped in usual sterile fashion.   Extremities wrapped in Esmarch, tourniquet inflated to 300 mmHg.  A midline incision was made with a 10 blade through subcutaneous tissue.  He has had a lot of edema in the subcu tissues.  There was no purulence noted there.  A fresh blade was used make a medial arthrotomy.  Clear to slightly yellow- tinged fluid was present in the joint and that was sent for Gram stain, culture and sensitivity, aerobic and anaerobic.  He has had a little bit of fibrinous debris in the joint, but no gross purulence.  We subluxed the tibia forward and removed the tibial polyethylene, which was 25 mm thick and was a TC3 rotating platform for size 4.  We then irrigated thoroughly with 3 L of saline using pulsatile lavage and debrided any abnormal looking tissue.  There was barely any inflamed tissue in the joint.  I then placed about a 100 mL of CHG solution and thoroughly rubbed it throughout the joint and across the metal surfaces.  That stayed in for approximately 3-5 minutes and then we removed that and irrigated with another 3 L of saline using pulsatile lavage.  The new tibial polyethylene was placed, this time 30 mm in thickness.  The knee was reduced with excellent  stability throughout full range of motion. Another liter of saline was used to further irrigate the joint.  Once completed, then I inspected again and did not see any abnormal tissue. Hemovac drain was placed in the joint and then the arthrotomy closed over the drain with a running #1 V-Loc suture.  Tourniquet released, total time of 26 minutes.  Second limb of Hemovac drain was placed in the subcu tissues.  Subcu was closed with interrupted 2-0 Vicryl and skin with staples.  Drains hooked to suction.  Incision cleaned and dried and a bulky sterile dressing applied.  He was then awakened and transported to recovery in stable condition.     Ollen Gross, M.D.   ______________________________ Ollen Gross, M.D.    FA/MEDQ  D:   10/01/2016  T:  10/02/2016  Job:  [id number]

## 2016-10-02 NOTE — Progress Notes (Signed)
Physical Therapy Treatment Patient Details Name: [name] MRN: [id number] DOB: 01-15-1961 Today's Date: 10/02/2016    History of Present Illness 55 yo male s/p I and D and ploy exchange of RTKA, multiple surgeries of the right knee    PT Comments    The [patient is progressing well. Continue PT while in acute care.  Follow Up Recommendations  No PT follow up     Equipment Recommendations  None recommended by PT    Recommendations for Other Services       Precautions / Restrictions Precautions Required Braces or Orthoses: Knee Immobilizer - Right Knee Immobilizer - Right: Discontinue once straight leg raise with < 10 degree lag    Mobility  Bed Mobility   Bed Mobility: Supine to Sit     Supine to sit: Min guard     General bed mobility comments: cues for technique  Transfers   Equipment used: Rolling walker (2 wheeled) Transfers: Sit to/from Stand Sit to Stand: Min guard         General transfer comment: cues for hand and right leg position  Ambulation/Gait Ambulation/Gait assistance: Min guard Ambulation Distance (Feet): 150 Feet Assistive device: Rolling walker (2 wheeled) Gait Pattern/deviations: Step-to pattern;Step-through pattern     General Gait Details: cues for sequence   Stairs            Wheelchair Mobility    Modified Rankin (Stroke Patients Only)       Balance                                    Cognition Arousal/Alertness: Awake/alert                          Exercises Total Joint Exercises Ankle Circles/Pumps: AROM;Both;10 reps;Supine Quad Sets: AROM;Both;10 reps;Supine Heel Slides: AAROM;Right;10 reps;Supine Hip ABduction/ADduction: AAROM;Right;10 reps;Supine Straight Leg Raises: AAROM;Right;10 reps;Supine    General Comments        Pertinent Vitals/Pain Pain Score: 6  Pain Location: right knee Pain Descriptors / Indicators: Aching;Sore Pain Intervention(s): Monitored during  session;Premedicated before session;Repositioned    Home Living                      Prior Function            PT Goals (current goals can now be found in the care plan section) Progress towards PT goals: Progressing toward goals    Frequency    7X/week      PT Plan Current plan remains appropriate    Co-evaluation             End of Session Equipment Utilized During Treatment: Right knee immobilizer Activity Tolerance: Patient tolerated treatment well Patient left: in bed;with call bell/phone within reach;with family/visitor present     Time: 4193-7902 PT Time Calculation (min) (ACUTE ONLY): 18 min  Charges:  $Gait Training: 8-22 mins                    G Codes:      Rada Hay 10/02/2016, 4:54 PM

## 2016-10-02 NOTE — Progress Notes (Signed)
OT Cancellation Note  Patient Details Name: [name] MRN: [id number] DOB: 12-Mar-1961   Cancelled Treatment:    Reason Eval/Treat Not Completed: OT screened, no needs identified, will sign off  Galen Manila 10/02/2016, 10:45 AM

## 2016-10-02 NOTE — Progress Notes (Signed)
Peripherally Inserted Central Catheter/Midline Placement  The IV Nurse has discussed with the patient and/or persons authorized to consent for the patient, the purpose of this procedure and the potential benefits and risks involved with this procedure.  The benefits include less needle sticks, lab draws from the catheter, and the patient may be discharged home with the catheter. Risks include, but not limited to, infection, bleeding, blood clot (thrombus formation), and puncture of an artery; nerve damage and irregular heartbeat and possibility to perform a PICC exchange if needed/ordered by physician.  Alternatives to this procedure were also discussed.  Bard Power PICC patient education guide, fact sheet on infection prevention and patient information card has been provided to patient /or left at bedside.    PICC/Midline Placement Documentation        [name] 10/02/2016, 5:43 PM

## 2016-10-02 NOTE — Evaluation (Signed)
Physical Therapy Evaluation Patient Details Name: [name] MRN: [id number] DOB: [birth year] Today's Date: 10/02/2016   History of Present Illness  55 yo male s/p I and D and ploy exchange of RTKA, multiple surgeries of the right knee  Clinical Impression  The patient is progressing well. Plans DC home with  No therapy . Will review exercises. Pt admitted with above diagnosis. Pt currently with functional limitations due to the deficits listed below (see PT Problem List). Pt will benefit from skilled PT to increase their independence and safety with mobility to allow discharge to the venue listed below.       Follow Up Recommendations No PT follow up    Equipment Recommendations  None recommended by PT    Recommendations for Other Services       Precautions / Restrictions Precautions Precautions: Knee Required Braces or Orthoses: Knee Immobilizer - Right Knee Immobilizer - Right: Discontinue once straight leg raise with < 10 degree lag Restrictions Weight Bearing Restrictions: No      Mobility  Bed Mobility Overal bed mobility: Needs Assistance Bed Mobility: Supine to Sit     Supine to sit: Min guard     General bed mobility comments: cues for technique  Transfers Overall transfer level: Needs assistance Equipment used: Rolling walker (2 wheeled) Transfers: Sit to/from Stand Sit to Stand: Min guard         General transfer comment: cues for hand and right leg position  Ambulation/Gait Ambulation/Gait assistance: Min assist Ambulation Distance (Feet): 150 Feet Assistive device: Rolling walker (2 wheeled) Gait Pattern/deviations: Step-to pattern;Antalgic     General Gait Details: cues for sequence  Stairs            Wheelchair Mobility    Modified Rankin (Stroke Patients Only)       Balance                                             Pertinent Vitals/Pain Pain Assessment: 0-10 Pain Score: 5  Pain Location: right  knee Pain Descriptors / Indicators: Aching;Sore Pain Intervention(s): Limited activity within patient's tolerance;Monitored during session;Repositioned;Ice applied    Home Living Family/patient expects to be discharged to:: Private residence Living Arrangements: Spouse/significant other Available Help at Discharge: Family Type of Home: House Home Access: Stairs to enter Entrance Stairs-Rails: Right Entrance Stairs-Number of Steps: 4-front, 3 back Home Layout: One level Home Equipment: Walker - 2 wheels;Cane - single point;Crutches;Bedside commode;Shower seat;Wheelchair - manual      Prior Function Level of Independence: Independent               Hand Dominance        Extremity/Trunk Assessment   Upper Extremity Assessment Upper Extremity Assessment: Overall WFL for tasks assessed    Lower Extremity Assessment Lower Extremity Assessment: RLE deficits/detail RLE Deficits / Details: knee flexion 45       Communication      Cognition Arousal/Alertness: Awake/alert Behavior During Therapy: WFL for tasks assessed/performed Overall Cognitive Status: Within Functional Limits for tasks assessed                      General Comments      Exercises Total Joint Exercises Quad Sets: AROM;Right;10 reps   Assessment/Plan    PT Assessment Patient needs continued PT services  PT Problem List Decreased strength;Decreased range of motion;Decreased activity  tolerance;Decreased mobility;Decreased knowledge of precautions;Decreased knowledge of use of DME          PT Treatment Interventions DME instruction;Gait training;Stair training;Therapeutic activities;Therapeutic exercise;Functional mobility training;Patient/family education    PT Goals (Current goals can be found in the Care Plan section)  Acute Rehab PT Goals Patient Stated Goal: to get home PT Goal Formulation: With patient Time For Goal Achievement: 10/05/16 Potential to Achieve Goals: Good     Frequency 7X/week   Barriers to discharge        Co-evaluation               End of Session Equipment Utilized During Treatment: Right knee immobilizer Activity Tolerance: Patient tolerated treatment well Patient left: in chair;with call bell/phone within reach Nurse Communication: Mobility status         Time: 3491-7915 PT Time Calculation (min) (ACUTE ONLY): 27 min   Charges:   PT Evaluation $PT Eval Low Complexity: 1 Procedure PT Treatments $Gait Training: 8-22 mins   PT G Codes:        Rada Hay 10/02/2016, 10:29 AM

## 2016-10-02 NOTE — Consult Note (Signed)
[facility]       Reason for Consult: PJI    Referring Physician: Dr. Despina Hick  Active Problems:   Septic arthritis of knee, right (HCC)   . acetaminophen  1,000 mg Oral Q6H  . apixaban  2.5 mg Oral Q12H  . DAPTOmycin (CUBICIN)  IV  6 mg/kg Intravenous Q24H  . diltiazem  240 mg Oral Daily  . docusate sodium  100 mg Oral BID  . furosemide  40 mg Oral Daily  . losartan  100 mg Oral Daily  . metoprolol succinate  50 mg Oral Daily  . pantoprazole  40 mg Oral Daily    Recommendations: daptomycin pending any cultures picc line already placed  6 weeks IV daptomycin per home health protocol through Jan 29th, labs to [facility] [phone]. Will need oral continuation therapy potentially with pcn based Needs weekly CK, cbc, cmp on daptomycin by home health Will change antibiotics as needed for any growth on culture  Assessment: He has PJI and recently with Staph lugdunensis and multiple allergies.   He does remember vancomycin allergy but penicillin and sulfa allergies he only remembers with itching, no rash, no hives, no sob, no throat swelling.    Antibiotics: daptomycine  HPI: [name] is a 55 y.o. 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  He again developed PJI with Staph lugdunensis earlier this year and treated with daptomycin after developing a vancomycin allergy. He completed 6 weeks and stopped after that.    Review of Systems:  Constitutional: negative for fevers and chills Gastrointestinal: negative for diarrhea Integument/breast: negative for rash All other systems reviewed and are negative    Past Medical History:  Diagnosis Date  . Asthma   . Cellulitis of lower extremity    "usually RLL; this time it's got up to my lower abdoment" (02/11/2014)-series of antibiotics completed 02-28-14  .  Diverticulosis   . GERD (gastroesophageal reflux disease)   . Hypertension   . Infection of right knee (HCC)   . OSA (obstructive sleep apnea)    "quit wearing mask several years ago" (02/11/2014)  . PONV (postoperative nausea and vomiting)   . Rheumatoid arthritis (HCC) dx'd ~ 1977  . S/P PICC central line placement 03-16-14   right upper arm remains intact.05-11-14 removed 1 week ago.    Social History  Substance Use Topics  . Smoking status: Former Smoker    Packs/day: 1.00    Years: 3.00    Types: Cigarettes    Quit date: 03/16/1981  . Smokeless tobacco: Current User    Types: Snuff     Comment: "quit smoking cigarettes in the 1980's"  . Alcohol use Yes     Comment: occas    Family History  Problem Relation Age of Onset  . Colon cancer Mother   . Hypertension Mother   . Diabetes Mellitus II Paternal Grandmother     Allergies  Allergen Reactions  . Avelox [Moxifloxacin Hcl In Nacl] Hives, Shortness Of Breath and Itching  . Doxycycline Itching  . Rocephin [Ceftriaxone Sodium In Dextrose] Hives and Itching  . Sulfa Antibiotics Itching  . Vancomycin Hives and Itching  . Erythrocin Rash  . Penicillins Rash    Has patient had a PCN reaction causing immediate rash, facial/tongue/throat swelling, SOB or lightheadedness with hypotension: Yes Has patient had a PCN reaction causing  severe rash involving mucus membranes or skin necrosis: No Has patient had a PCN reaction that required hospitalization No Has patient had a PCN reaction occurring within the last 10 years: No If all of the above answers are "NO", then may proceed with Cephalosporin use.  . Sulfasalazine Rash    Physical Exam: Constitutional: in no apparent distress and alert  Vitals:   10/02/16 0936 10/02/16 0941  BP: (!) 141/72 (!) 141/72  Pulse: 74 77  Resp: 16   Temp: 97.4 F (36.3 C)    EYES: anicteric ENMT: no thrush Cardiovascular: Cor RRR Respiratory: CTA B; normal respiratory effort GI: Bowel  sounds are normal, liver is not enlarged, spleen is not enlarged Musculoskeletal: leg wrapped Skin: negatives: no rash Hematologic: no cervical lad  Lab Results  Component Value Date   WBC 6.5 10/02/2016   HGB 11.4 (L) 10/02/2016   HCT 35.9 (L) 10/02/2016   MCV 83.7 10/02/2016   PLT 385 10/02/2016    Lab Results  Component Value Date   CREATININE 0.84 10/02/2016   BUN 14 10/02/2016   NA 138 10/02/2016   K 4.8 10/02/2016   CL 107 10/02/2016   CO2 24 10/02/2016    Lab Results  Component Value Date   ALT 15 (L) 04/25/2016   AST 16 04/25/2016   ALKPHOS 58 04/25/2016     Microbiology: Recent Results (from the past 240 hour(s))  Aerobic/Anaerobic Culture (surgical/deep wound)     Status: None (Preliminary result)   Collection Time: 10/01/16  3:55 PM  Result Value Ref Range Status   Specimen Description SYNOVIAL RIGHT KNEE  Final   Special Requests NONE  Final   Gram Stain   Final    RARE WBC PRESENT, PREDOMINANTLY PMN NO ORGANISMS SEEN    Culture   Final    NO GROWTH < 12 HOURS Performed at [facility]    Report Status PENDING  Incomplete    Staci Righter, MD [facility] [email or web address] [phone] pager  [phone] cell 10/02/2016, 3:31 PM

## 2016-10-03 DIAGNOSIS — T8450XA Infection and inflammatory reaction due to unspecified internal joint prosthesis, initial encounter: Secondary | ICD-10-CM

## 2016-10-03 DIAGNOSIS — M0000 Staphylococcal arthritis, unspecified joint: Secondary | ICD-10-CM

## 2016-10-03 DIAGNOSIS — B957 Other staphylococcus as the cause of diseases classified elsewhere: Secondary | ICD-10-CM

## 2016-10-03 LAB — CBC
HEMATOCRIT: 35.9 % — AB (ref 39.0–52.0)
HEMOGLOBIN: 11.4 g/dL — AB (ref 13.0–17.0)
MCH: 26.6 pg (ref 26.0–34.0)
MCHC: 31.8 g/dL (ref 30.0–36.0)
MCV: 83.9 fL (ref 78.0–100.0)
Platelets: 430 10*3/uL — ABNORMAL HIGH (ref 150–400)
RBC: 4.28 MIL/uL (ref 4.22–5.81)
RDW: 15.6 % — AB (ref 11.5–15.5)
WBC: 15.1 10*3/uL — ABNORMAL HIGH (ref 4.0–10.5)

## 2016-10-03 LAB — BASIC METABOLIC PANEL
Anion gap: 4 — ABNORMAL LOW (ref 5–15)
BUN: 17 mg/dL (ref 6–20)
CALCIUM: 9 mg/dL (ref 8.9–10.3)
CHLORIDE: 107 mmol/L (ref 101–111)
CO2: 29 mmol/L (ref 22–32)
CREATININE: 0.81 mg/dL (ref 0.61–1.24)
GFR calc non Af Amer: >60 mL/min (ref >60)
GLUCOSE: 139 mg/dL — AB (ref 65–99)
Potassium: 3.9 mmol/L (ref 3.5–5.1)
Sodium: 140 mmol/L (ref 135–145)

## 2016-10-03 MED ORDER — SODIUM CHLORIDE 0.9 % IV SOLN: 6.0000 mg/kg | INTRAVENOUS | 0 refills | Status: AC

## 2016-10-03 MED ORDER — TRAMADOL HCL 50 MG PO TABS: 50.0000 mg | ORAL_TABLET | Freq: Four times a day (QID) | ORAL | Status: DC | PRN

## 2016-10-03 MED ORDER — SODIUM CHLORIDE 0.9 % IV SOLN: 6.0000 mg/kg | INTRAVENOUS | 0 refills | Status: DC

## 2016-10-03 MED ORDER — ALUM & MAG HYDROXIDE-SIMETH 200-200-20 MG/5ML PO SUSP
30.0000 mL | Freq: Once | ORAL | Status: AC
  Administered 2016-10-03: 30 mL via ORAL
  Filled 2016-10-03: qty 30

## 2016-10-03 NOTE — Progress Notes (Signed)
Physical Therapy Treatment Patient Details Name: [name] MRN: [id number] DOB: [birth year] Today's Date: 10/03/2016    History of Present Illness 55 yo male s/p I and D and ploy exchange of RTKA, multiple surgeries of the right knee    PT Comments    The patient is progressing well. Will remain in hospital another night . Continue PT while in acute care.  Follow Up Recommendations  No PT follow up     Equipment Recommendations  None recommended by PT    Recommendations for Other Services       Precautions / Restrictions Precautions Precautions: Knee;Fall Required Braces or Orthoses: Knee Immobilizer - Right Knee Immobilizer - Right: Discontinue once straight leg raise with < 10 degree lag Restrictions Weight Bearing Restrictions: No    Mobility  Bed Mobility   Bed Mobility: Supine to Sit     Supine to sit: Min guard     General bed mobility comments: cues for technique  Transfers Overall transfer level: Needs assistance Equipment used: Rolling walker (2 wheeled) Transfers: Sit to/from Stand Sit to Stand: Min guard         General transfer comment: cues for hand and right leg position  Ambulation/Gait Ambulation/Gait assistance: Min guard Ambulation Distance (Feet): 150 Feet Assistive device: Rolling walker (2 wheeled) Gait Pattern/deviations: Step-to pattern;Step-through pattern     General Gait Details: cues for sequence   Stairs            Wheelchair Mobility    Modified Rankin (Stroke Patients Only)       Balance                                    Cognition Arousal/Alertness: Awake/alert                          Exercises Total Joint Exercises Ankle Circles/Pumps: AROM;Both Quad Sets: AROM;Both Short Arc Quad: AAROM;Right;10 reps Heel Slides: AAROM;Right;10 reps Hip ABduction/ADduction: AAROM;Right;10 reps Straight Leg Raises: AAROM;Right;10 reps Goniometric ROM: 10-60 knee flexion= right     General Comments        Pertinent Vitals/Pain Pain Score: 3  Pain Location: right knee, thigh Pain Descriptors / Indicators: Sore;Discomfort Pain Intervention(s): Ice applied;Repositioned;Premedicated before session;Monitored during session    Home Living                      Prior Function            PT Goals (current goals can now be found in the care plan section) Progress towards PT goals: Progressing toward goals    Frequency    7X/week      PT Plan Current plan remains appropriate    Co-evaluation             End of Session Equipment Utilized During Treatment: Right knee immobilizer Activity Tolerance: Patient tolerated treatment well Patient left: in chair;with call bell/phone within reach     Time: 0930-0955 PT Time Calculation (min) (ACUTE ONLY): 25 min  Charges:  $Gait Training: 8-22 mins $Therapeutic Exercise: 8-22 mins                    G Codes:      Rada Hay 10/03/2016, 11:28 AM  Blanchard Kelch PT [phone]

## 2016-10-03 NOTE — Progress Notes (Signed)
Physical Therapy Treatment Patient Details Name: [name] MRN: [id number] DOB: 08/03/1961 Today's Date: 10/03/2016    History of Present Illness 55 yo male s/p I and D and ploy exchange of RTKA, multiple surgeries of the right knee    PT Comments    Progressing with therapy. Reports minimal pain about the knee. Continue PT  Follow Up Recommendations  No PT follow up     Equipment Recommendations  None recommended by PT    Recommendations for Other Services       Precautions / Restrictions Precautions Precautions: Knee;Fall Required Braces or Orthoses: Knee Immobilizer - Right Knee Immobilizer - Right: Discontinue once straight leg raise with < 10 degree lag    Mobility  Bed Mobility   Bed Mobility: Supine to Sit     Supine to sit: Min guard        Transfers Overall transfer level: Needs assistance Equipment used: Rolling walker (2 wheeled) Transfers: Sit to/from Stand Sit to Stand: Supervision         General transfer comment: cues for hand and right leg position  Ambulation/Gait Ambulation/Gait assistance: Supervision Ambulation Distance (Feet): 150 Feet Assistive device: Rolling walker (2 wheeled) Gait Pattern/deviations: Step-to pattern;Step-through pattern         Stairs            Wheelchair Mobility    Modified Rankin (Stroke Patients Only)       Balance                                    Cognition Arousal/Alertness: Awake/alert                          Exercises      General Comments        Pertinent Vitals/Pain Pain Score: 3  Pain Location: right knee, thigh Pain Descriptors / Indicators: Sore;Discomfort Pain Intervention(s): Monitored during session;Premedicated before session    Home Living                      Prior Function            PT Goals (current goals can now be found in the care plan section) Progress towards PT goals: Progressing toward goals     Frequency    7X/week      PT Plan Current plan remains appropriate    Co-evaluation             End of Session Equipment Utilized During Treatment: Right knee immobilizer Activity Tolerance: Patient tolerated treatment well Patient left: in bed;with call bell/phone within reach;with family/visitor present     Time: 1420-1433 PT Time Calculation (min) (ACUTE ONLY): 13 min  Charges:  $Gait Training: 8-22 mins                    G Codes:      [name] 10/03/2016, 4:12 PM

## 2016-10-03 NOTE — Progress Notes (Signed)
[facility]   Reason for visit: Follow up on PJI  Interval History: no growth so far.   Physical Exam: Constitutional:  Vitals:   10/03/16 1028 10/03/16 1334  BP: 119/89 135/67  Pulse: 61 63  Resp:  17  Temp:  97.6 F (36.4 C)   patient appears in NAD  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   Plan: 1. Daptomycin 6 mg/kg daily through January 29.  2.  No rifampin due to interaction with apixaban.   3. Will need oral continuation after 6 weeks 4.  Weekly labs per home health to include cbc, bmp, ck ESR, CRP after 2 weeks Result copy to [facility] at [phone] We will arrange follow up in our office in 2-3 weeks. thanks

## 2016-10-03 NOTE — Progress Notes (Signed)
CM has faxed prescription, facesheet, face to face [facility] order, OP note ID note and H&P, PICC line note  to [facility] Attn: Maisie Fus [phone] with notation last run of IV ABX will be this evening ay 21:00 and SOC is requested for 12/06/15 at 21:00. No other CM needs were communicated.

## 2016-10-04 LAB — CBC
HCT: 34.9 % — ABNORMAL LOW (ref 39.0–52.0)
Hemoglobin: 11.1 g/dL — ABNORMAL LOW (ref 13.0–17.0)
MCH: 26.9 pg (ref 26.0–34.0)
MCHC: 31.8 g/dL (ref 30.0–36.0)
MCV: 84.7 fL (ref 78.0–100.0)
PLATELETS: 423 10*3/uL — AB (ref 150–400)
RBC: 4.12 MIL/uL — AB (ref 4.22–5.81)
RDW: 15.8 % — AB (ref 11.5–15.5)
WBC: 13.1 10*3/uL — ABNORMAL HIGH (ref 4.0–10.5)

## 2016-10-04 MED ORDER — TRAMADOL HCL 50 MG PO TABS: 50.0000 mg | ORAL_TABLET | Freq: Four times a day (QID) | ORAL | 1 refills | Status: DC | PRN

## 2016-10-04 MED ORDER — APIXABAN 2.5 MG PO TABS
2.5000 mg | ORAL_TABLET | Freq: Two times a day (BID) | ORAL | 0 refills | Status: DC
Start: 2016-10-04 — End: 2016-11-27

## 2016-10-04 MED ORDER — METHOCARBAMOL 500 MG PO TABS: 500.0000 mg | ORAL_TABLET | Freq: Four times a day (QID) | ORAL | 0 refills | Status: DC | PRN

## 2016-10-04 NOTE — Discharge Instructions (Addendum)

## 2016-10-04 NOTE — Progress Notes (Signed)
Rn reviewed discharge instructions with patient and family. All questions answered.   Paperwork and prescriptions given.   NT rolled patient down with all belongings to family car.  

## 2016-10-04 NOTE — Discharge Summary (Signed)
Physician Discharge Summary   Patient ID: [name] MRN: [id number] DOB/AGE: 1960/10/21 55 y.o.  Admit date: 10/01/2016 Discharge date: 10/04/2016  Primary Diagnosis:  Septic arthritis, right knee.  Admission Diagnoses:  Past Medical History:  Diagnosis Date  . Asthma   . Cellulitis of lower extremity    "usually RLL; this time it's got up to my lower abdoment" (02/11/2014)-series of antibiotics completed 02-28-14  . Diverticulosis   . GERD (gastroesophageal reflux disease)   . Hypertension   . Infection of right knee ([place])   . OSA (obstructive sleep apnea)    "quit wearing mask several years ago" (02/11/2014)  . PONV (postoperative nausea and vomiting)   . Rheumatoid arthritis ([place]) dx'd ~ 1977  . S/P PICC central line placement 03-16-14   right upper arm remains intact.05-11-14 removed 1 week ago.   Discharge Diagnoses:   Active Problems:   Septic arthritis of knee, right (HCC)  Estimated body mass index is 46.13 kg/m as calculated from the following:   Height as of this encounter: '5\' 8"'$  (1.727 m).   Weight as of this encounter: 137.6 kg (303 lb 6.4 oz).  Procedure:  Procedure(s) (LRB): IRRIGATION AND DEBRIDEMENT KNEE WITH POLY EXCHANGE (Right)   Consults: ID, Dr.Come  HPI: [name] is a 55 year old male with long complex history in regard to his right knee.  He has had previous infection treated with a two-stage revision.  He did well initially, then last year had a recurrent infection treated with irrigation, debridement and polyethylene exchange.  He had antibiotics for about 6 weeks.  He did well until acute onset of swelling in his leg last week with knee pain. Aspiration done, which initially was equivocal, but then Gram stain eventually grew staphylococcal species.  He presents here today for irrigation, debridement and polyethylene exchange.  Given the complexity of his knee, I felt that a resection arthroplasty would not be an option as he barely has any bone  stalk left from previous revision.  We are hoping to be able to control and eradicate the infection with the washout.  All possibilities were discussed with the patient, who elects to proceed in this manner.  Laboratory Data: Admission on 10/01/2016  Component Date Value Ref Range Status  . aPTT 10/01/2016 28  24 - 36 seconds Final  . Sodium 10/01/2016 139  135 - 145 mmol/L Final  . Potassium 10/01/2016 4.4  3.5 - 5.1 mmol/L Final  . Chloride 10/01/2016 106  101 - 111 mmol/L Final  . CO2 10/01/2016 26  22 - 32 mmol/L Final  . Glucose, Bld 10/01/2016 99  65 - 99 mg/dL Final  . BUN 10/01/2016 14  6 - 20 mg/dL Final  . Creatinine, Ser 10/01/2016 0.95  0.61 - 1.24 mg/dL Final  . Calcium 10/01/2016 8.7* 8.9 - 10.3 mg/dL Final  . GFR calc non Af Amer 10/01/2016 >60  >60 mL/min Final  . GFR calc Af Amer 10/01/2016 >60  >60 mL/min Final   Comment: (NOTE) The eGFR has been calculated using the CKD EPI equation. This calculation has not been validated in all clinical situations. eGFR's persistently <60 mL/min signify possible Chronic Kidney Disease.   . Anion gap 10/01/2016 7  5 - 15 Final  . WBC 10/01/2016 9.6  4.0 - 10.5 K/uL Final  . RBC 10/01/2016 4.50  4.22 - 5.81 MIL/uL Final  . Hemoglobin 10/01/2016 12.3* 13.0 - 17.0 g/dL Final  . HCT 10/01/2016 36.9* 39.0 - 52.0 % Final  .  MCV 10/01/2016 82.0  78.0 - 100.0 fL Final  . MCH 10/01/2016 27.3  26.0 - 34.0 pg Final  . MCHC 10/01/2016 33.3  30.0 - 36.0 g/dL Final  . RDW 10/01/2016 15.6* 11.5 - 15.5 % Final  . Platelets 10/01/2016 375  150 - 400 K/uL Final  . Prothrombin Time 10/01/2016 14.4  11.4 - 15.2 seconds Final  . INR 10/01/2016 1.11   Final  . Specimen Description 10/03/2016 SYNOVIAL RIGHT KNEE   Final  . Special Requests 10/03/2016 NONE   Final  . Gram Stain 10/03/2016    Final                   Value:RARE WBC PRESENT, PREDOMINANTLY PMN NO ORGANISMS SEEN   . Culture 10/03/2016    Final                   Value:NO GROWTH 2  DAYS NO ANAEROBES ISOLATED; CULTURE IN PROGRESS FOR 5 DAYS Performed at [facility]   . Report Status 10/03/2016 PENDING   Incomplete  . WBC 10/02/2016 6.5  4.0 - 10.5 K/uL Final  . RBC 10/02/2016 4.29  4.22 - 5.81 MIL/uL Final  . Hemoglobin 10/02/2016 11.4* 13.0 - 17.0 g/dL Final  . HCT 10/02/2016 35.9* 39.0 - 52.0 % Final  . MCV 10/02/2016 83.7  78.0 - 100.0 fL Final  . MCH 10/02/2016 26.6  26.0 - 34.0 pg Final  . MCHC 10/02/2016 31.8  30.0 - 36.0 g/dL Final  . RDW 10/02/2016 15.8* 11.5 - 15.5 % Final  . Platelets 10/02/2016 385  150 - 400 K/uL Final  . Sodium 10/02/2016 138  135 - 145 mmol/L Final  . Potassium 10/02/2016 4.8  3.5 - 5.1 mmol/L Final  . Chloride 10/02/2016 107  101 - 111 mmol/L Final  . CO2 10/02/2016 24  22 - 32 mmol/L Final  . Glucose, Bld 10/02/2016 153* 65 - 99 mg/dL Final  . BUN 10/02/2016 14  6 - 20 mg/dL Final  . Creatinine, Ser 10/02/2016 0.84  0.61 - 1.24 mg/dL Final  . Calcium 10/02/2016 8.4* 8.9 - 10.3 mg/dL Final  . GFR calc non Af Amer 10/02/2016 >60  >60 mL/min Final  . GFR calc Af Amer 10/02/2016 >60  >60 mL/min Final   Comment: (NOTE) The eGFR has been calculated using the CKD EPI equation. This calculation has not been validated in all clinical situations. eGFR's persistently <60 mL/min signify possible Chronic Kidney Disease.   . Anion gap 10/02/2016 7  5 - 15 Final  . Total CK 10/02/2016 86  49 - 397 U/L Final  . WBC 10/03/2016 15.1* 4.0 - 10.5 K/uL Final  . RBC 10/03/2016 4.28  4.22 - 5.81 MIL/uL Final  . Hemoglobin 10/03/2016 11.4* 13.0 - 17.0 g/dL Final  . HCT 10/03/2016 35.9* 39.0 - 52.0 % Final  . MCV 10/03/2016 83.9  78.0 - 100.0 fL Final  . MCH 10/03/2016 26.6  26.0 - 34.0 pg Final  . MCHC 10/03/2016 31.8  30.0 - 36.0 g/dL Final  . RDW 10/03/2016 15.6* 11.5 - 15.5 % Final  . Platelets 10/03/2016 430* 150 - 400 K/uL Final  . Sodium 10/03/2016 140  135 - 145 mmol/L Final  . Potassium 10/03/2016 3.9  3.5 - 5.1 mmol/L Final  .  Chloride 10/03/2016 107  101 - 111 mmol/L Final  . CO2 10/03/2016 29  22 - 32 mmol/L Final  . Glucose, Bld 10/03/2016 139* 65 - 99 mg/dL Final  .  BUN 10/03/2016 17  6 - 20 mg/dL Final  . Creatinine, Ser 10/03/2016 0.81  0.61 - 1.24 mg/dL Final  . Calcium 10/03/2016 9.0  8.9 - 10.3 mg/dL Final  . GFR calc non Af Amer 10/03/2016 >60  >60 mL/min Final  . GFR calc Af Amer 10/03/2016 >60  >60 mL/min Final   Comment: (NOTE) The eGFR has been calculated using the CKD EPI equation. This calculation has not been validated in all clinical situations. eGFR's persistently <60 mL/min signify possible Chronic Kidney Disease.   . Anion gap 10/03/2016 4* 5 - 15 Final  . WBC 10/04/2016 13.1* 4.0 - 10.5 K/uL Final  . RBC 10/04/2016 4.12* 4.22 - 5.81 MIL/uL Final  . Hemoglobin 10/04/2016 11.1* 13.0 - 17.0 g/dL Final  . HCT 10/04/2016 34.9* 39.0 - 52.0 % Final  . MCV 10/04/2016 84.7  78.0 - 100.0 fL Final  . MCH 10/04/2016 26.9  26.0 - 34.0 pg Final  . MCHC 10/04/2016 31.8  30.0 - 36.0 g/dL Final  . RDW 10/04/2016 15.8* 11.5 - 15.5 % Final  . Platelets 10/04/2016 423* 150 - 400 K/uL Final     X-Rays:No results found.  EKG: Orders placed or performed during the hospital encounter of 04/25/16  . EKG 12-Lead  . EKG 12-Lead     Hospital Course: AYO SMOAK is a 55 y.o. who was admitted to [facility]. They were brought to the operating room on 10/01/2016 and underwent Procedure(s): Elias-Fela Solis.  Patient tolerated the procedure well and was later transferred to the recovery room and then to the orthopaedic floor for postoperative care.  They were given PO and IV analgesics for pain control following their surgery.  They were given 24 hours of postoperative antibiotics of  Anti-infectives    Start     Dose/Rate Route Frequency Ordered Stop   10/03/16 0000  DAPTOmycin 825.5 mg in sodium chloride 0.9 % 100 mL  Status:  Discontinued     6 mg/kg  137.6  kg 233 mL/hr over 30 Minutes Intravenous Every 24 hours 10/03/16 1051 10/03/16    10/03/16 0000  DAPTOmycin 825.5 mg in sodium chloride 0.9 % 100 mL     6 mg/kg  137.6 kg 233 mL/hr over 30 Minutes Intravenous Every 24 hours 10/03/16 1054 11/12/16 2359   10/01/16 2200  DAPTOmycin (CUBICIN) 825.5 mg in sodium chloride 0.9 % IVPB     6 mg/kg  137.6 kg 233 mL/hr over 30 Minutes Intravenous Every 24 hours 10/01/16 2107     10/01/16 1730  clindamycin (CLEOCIN) IVPB 900 mg     900 mg 100 mL/hr over 30 Minutes Intravenous  Once 10/01/16 1716 10/01/16 1911   10/01/16 1700  vancomycin (VANCOCIN) IVPB 1000 mg/200 mL premix  Status:  Discontinued     1,000 mg 200 mL/hr over 60 Minutes Intravenous  Once 10/01/16 1655 10/01/16 1712     and started on DVT prophylaxis in the form of Eliquis.   PT and OT were ordered for total joint protocol.  Discharge planning consulted to help with postop disposition and equipment needs.  Patient had a decent night on the evening of surgery.  They started to get up OOB with therapy on day one. [facility] called and patient was seen by Dr. Linus Salmons. Recommendations: daptomycin pending any cultures picc line already placed  6 weeks IV daptomycin per home health protocol through Jan 29th, labs to [place]. Will need oral continuation  therapy potentially with pcn based Needs weekly CK, cbc, cmp on daptomycin by home health Will change antibiotics as needed for any growth on culture Assessment: He has PJI and recently with Staph lugdunensis and multiple allergies.   He does remember vancomycin allergy but penicillin and sulfa allergies he only remembers with itching, no rash, no hives, no sob, no throat swelling.   Antibiotics: daptomycine  Continued to work with therapy into day two.  PICC line ordered.   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  Plan: 1. Daptomycin 6 mg/kg daily through January 29.  2.  No rifampin due to  interaction with apixaban.   3. Will need oral continuation after 6 weeks 4.  Weekly labs per home health to include cbc, bmp, ck ESR, CRP after 2 weeks Result copy to [facility] at [phone] We will arrange follow up in our office in 2-3 weeks. Thanks  By day three, the patient had progressed with therapy and meeting their goals.  Incision was healing well.  Patient was seen in rounds and was ready to go home.   Discharge home with IV ABX vix PICC Diet - Cardiac diet Follow up - in 2 weeks Activity - WBAT Disposition - Home Condition Upon Discharge - Stable D/C Meds - See DC Summary DVT Prophylaxis - Eliquis  Discharge Instructions    Call MD / Call 911    Complete by:  As directed    If you experience chest pain or shortness of breath, CALL 911 and be transported to the hospital emergency room.  If you develope a fever above 101 F, pus (white drainage) or increased drainage or redness at the wound, or calf pain, call your surgeon's office.   Constipation Prevention    Complete by:  As directed    Drink plenty of fluids.  Prune juice may be helpful.  You may use a stool softener, such as Colace (over the counter) 100 mg twice a day.  Use MiraLax (over the counter) for constipation as needed.   Diet - low sodium heart healthy    Complete by:  As directed    Discharge instructions    Complete by:  As directed    Pick up stool softner and laxative for home use following surgery while on pain medications. Do not submerge incision under water. Please use good hand washing techniques while changing dressing each day. May shower starting three days after surgery. Please use a clean towel to pat the incision dry following showers. Continue to use ice for pain and swelling after surgery. Do not use any lotions or creams on the incision until instructed by your surgeon.   Postoperative Constipation Protocol  Constipation - defined medically as fewer than three stools per week and severe  constipation as less than one stool per week.  One of the most common issues patients have following surgery is constipation.  Even if you have a regular bowel pattern at home, your normal regimen is likely to be disrupted due to multiple reasons following surgery.  Combination of anesthesia, postoperative narcotics, change in appetite and fluid intake all can affect your bowels.  In order to avoid complications following surgery, here are some recommendations in order to help you during your recovery period.  Colace (docusate) - Pick up an over-the-counter form of Colace or another stool softener and take twice a day as long as you are requiring postoperative pain medications.  Take with a full glass of water daily.  If you experience loose stools or diarrhea, hold the colace until you stool forms back up.  If your symptoms do not get better within 1 week or if they get worse, check with your doctor.  Dulcolax (bisacodyl) - Pick up over-the-counter and take as directed by the product packaging as needed to assist with the movement of your bowels.  Take with a full glass of water.  Use this product as needed if not relieved by Colace only.   MiraLax (polyethylene glycol) - Pick up over-the-counter to have on hand.  MiraLax is a solution that will increase the amount of water in your bowels to assist with bowel movements.  Take as directed and can mix with a glass of water, juice, soda, coffee, or tea.  Take if you go more than two days without a movement. Do not use MiraLax more than once per day. Call your doctor if you are still constipated or irregular after using this medication for 7 days in a row.  If you continue to have problems with postoperative constipation, please contact the office for further assistance and recommendations.  If you experience "the worst abdominal pain ever" or develop nausea or vomiting, please contact the office immediatly for further recommendations for  treatment.   Take Eliquis 2.5 mg twice a day for two and a half more weeks. Once the patient has completed the blood thinner regimen, then take a Baby 81 mg Aspirin daily for three more weeks.   Do not sit on low chairs, stoools or toilet seats, as it may be difficult to get up from low surfaces    Complete by:  As directed    Driving restrictions    Complete by:  As directed    No driving until released by the physician.   Increase activity slowly as tolerated    Complete by:  As directed    Lifting restrictions    Complete by:  As directed    No lifting until released by the physician.   Patient may shower    Complete by:  As directed    You may shower without a dressing once there is no drainage.  Do not wash over the wound.  If drainage remains, do not shower until drainage stops.   Weight bearing as tolerated    Complete by:  As directed    Laterality:  right   Extremity:  Lower     Allergies as of 10/04/2016      Reactions   Avelox [moxifloxacin Hcl In Nacl] Hives, Shortness Of Breath, Itching   Doxycycline Itching   Rocephin [ceftriaxone Sodium In Dextrose] Hives, Itching   Sulfa Antibiotics Itching   Vancomycin Hives, Itching   Erythrocin Rash   Penicillins Rash   Has patient had a PCN reaction causing immediate rash, facial/tongue/throat swelling, SOB or lightheadedness with hypotension: Yes Has patient had a PCN reaction causing severe rash involving mucus membranes or skin necrosis: No Has patient had a PCN reaction that required hospitalization No Has patient had a PCN reaction occurring within the last 10 years: No If all of the above answers are "NO", then may proceed with Cephalosporin use.   Sulfasalazine Rash      Medication List    STOP taking these medications   folic acid 1 MG tablet Commonly known as:  FOLVITE   hydroxychloroquine 200 MG tablet Commonly known as:  PLAQUENIL   methotrexate 50 MG/2ML injection     TAKE these medications  acetaminophen 325 MG tablet Commonly known as:  TYLENOL Take 2 tablets (650 mg total) by mouth every 6 (six) hours as needed for mild pain, moderate pain, fever or headache.   apixaban 2.5 MG Tabs tablet Commonly known as:  ELIQUIS Take 1 tablet (2.5 mg total) by mouth every 12 (twelve) hours. Take Eliquis 2.5 mg twice a day for two and a half more weeks. Once the patient has completed the blood thinner regimen, then take a Baby 81 mg Aspirin daily for three more weeks.   DAPTOmycin 825.5 mg in sodium chloride 0.9 % 100 mL Inject 825.5 mg into the vein daily. Will need to take via PICC line for a total of 6 weeks (forty more days from discharge)   diltiazem 240 MG 24 hr capsule Commonly known as:  CARDIZEM CD Take 240 mg by mouth daily.   Fluticasone-Salmeterol 250-50 MCG/DOSE Aepb Commonly known as:  ADVAIR Inhale 1 puff into the lungs 2 (two) times daily as needed (wheezing).   furosemide 40 MG tablet Commonly known as:  LASIX Take 40 mg by mouth daily.   losartan 100 MG tablet Commonly known as:  COZAAR Take 100 mg by mouth daily.   methocarbamol 500 MG tablet Commonly known as:  ROBAXIN Take 1 tablet (500 mg total) by mouth every 6 (six) hours as needed for muscle spasms.   metoprolol succinate 50 MG 24 hr tablet Commonly known as:  TOPROL-XL Take 50 mg by mouth daily.   oxyCODONE 5 MG immediate release tablet Commonly known as:  Oxy IR/ROXICODONE Take 5 mg by mouth every 4 (four) hours as needed for severe pain.   RABEprazole 20 MG tablet Commonly known as:  ACIPHEX Take 20 mg by mouth daily.   traMADol 50 MG tablet Commonly known as:  ULTRAM Take 1-2 tablets (50-100 mg total) by mouth every 6 (six) hours as needed (mild pain).      Follow-up Information    [facility] Follow up.   Why:  Home Health nurse and IV antibiotic management Contact information: [phone]       [name], MD. Schedule an appointment as soon as possible for a visit on  10/16/2016.   Specialty:  Orthopedic Surgery Contact information: [address] [place] [address] [phone]           Signed: [name], PA-C Orthopaedic Surgery 10/04/2016, 9:40 AM

## 2016-10-04 NOTE — Progress Notes (Signed)
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, multiple surgeries of the right knee    PT Comments    Ready for DC  Follow Up Recommendations  No PT follow up     Equipment Recommendations       Recommendations for Other Services       Precautions / Restrictions Precautions Precautions: Knee;Fall Required Braces or Orthoses: Knee Immobilizer - Right Knee Immobilizer - Right: Discontinue once straight leg raise with < 10 degree lag    Mobility  Bed Mobility   Bed Mobility: Supine to Sit           General bed mobility comments: in recliner  Transfers Overall transfer level: Needs assistance Equipment used: Rolling walker (2 wheeled) Transfers: Sit to/from Stand Sit to Stand: Supervision         General transfer comment: cues for hand and right leg position  Ambulation/Gait Ambulation/Gait assistance: Supervision Ambulation Distance (Feet): 50 Feet Assistive device: Rolling walker (2 wheeled) Gait Pattern/deviations: Step-to pattern;Step-through pattern     General Gait Details: cues for sequence   Stairs Stairs: Yes   Stair Management: Step to pattern Number of Stairs: 2 General stair comments: cues for sequence and safety  Wheelchair Mobility    Modified Rankin (Stroke Patients Only)       Balance                                    Cognition Arousal/Alertness: Awake/alert                          Exercises      General Comments        Pertinent Vitals/Pain Pain Score: 3  Pain Location: right knee, thigh Pain Descriptors / Indicators: Sore;Discomfort    Home Living                      Prior Function            PT Goals (current goals can now be found in the care plan section) Progress towards PT goals: Progressing toward goals     Frequency           PT Plan Current plan remains appropriate    Co-evaluation             End of Session Equipment Utilized During Treatment: Right knee immobilizer Activity Tolerance: Patient tolerated treatment well Patient left: in chair;with call bell/phone within reach     Time: 7322-0254 PT Time Calculation (min) (ACUTE ONLY): 14 min  Charges:  $Gait Training: 8-22 mins                    G Codes:      Rada Hay 10/04/2016, 12:26 PM

## 2016-10-04 NOTE — Progress Notes (Signed)
   Subjective: 3 Days Post-Op Procedure(s) (LRB): IRRIGATION AND DEBRIDEMENT KNEE WITH POLY EXCHANGE (Right) Patient reports pain as mild.   Patient seen in rounds for Dr. Lequita Halt. Patient is well, and has had no acute complaints or problems Patient is ready to go home  Objective: Vital signs in last 24 hours: Temp:  [97.6 F (36.4 C)-97.9 F (36.6 C)] 97.9 F (36.6 C) (12/20 2218) Pulse Rate:  [51-63] 51 (12/21 0511) Resp:  [16-17] 16 (12/21 0511) BP: (119-135)/(67-89) 131/67 (12/21 0511) SpO2:  [99 %-100 %] 100 % (12/21 0511)  Intake/Output from previous day:  Intake/Output Summary (Last 24 hours) at 10/04/16 0932 Last data filed at 10/04/16 0805  Gross per 24 hour  Intake           979.51 ml  Output              545 ml  Net           434.51 ml    Intake/Output this shift: Total I/O In: 240 [P.O.:240] Out: -   Labs:  Recent Labs  10/01/16 1545 10/02/16 0400 10/03/16 0459 10/04/16 0423  HGB 12.3* 11.4* 11.4* 11.1*    Recent Labs  10/03/16 0459 10/04/16 0423  WBC 15.1* 13.1*  RBC 4.28 4.12*  HCT 35.9* 34.9*  PLT 430* 423*    Recent Labs  10/02/16 0400 10/03/16 0459  NA 138 140  K 4.8 3.9  CL 107 107  CO2 24 29  BUN 14 17  CREATININE 0.84 0.81  GLUCOSE 153* 139*  CALCIUM 8.4* 9.0    Recent Labs  10/01/16 1545  INR 1.11    EXAM: General - Patient is Alert, Appropriate and Oriented Extremity - Neurovascular intact Sensation intact distally Intact pulses distally Dorsiflexion/Plantar flexion intact Incision - clean, dry, no drainage, staples intact Motor Function - intact, moving foot and toes well on exam.  Hemovac Pulled   Assessment/Plan: 3 Days Post-Op Procedure(s) (LRB): IRRIGATION AND DEBRIDEMENT KNEE WITH POLY EXCHANGE (Right) Procedure(s) (LRB): IRRIGATION AND DEBRIDEMENT KNEE WITH POLY EXCHANGE (Right) Past Medical History:  Diagnosis Date  . Asthma   . Cellulitis of lower extremity    "usually RLL; this time it's got  up to my lower abdoment" (02/11/2014)-series of antibiotics completed 02-28-14  . Diverticulosis   . GERD (gastroesophageal reflux disease)   . Hypertension   . Infection of right knee (HCC)   . OSA (obstructive sleep apnea)    "quit wearing mask several years ago" (02/11/2014)  . PONV (postoperative nausea and vomiting)   . Rheumatoid arthritis (HCC) dx'd ~ 1977  . S/P PICC central line placement 03-16-14   right upper arm remains intact.05-11-14 removed 1 week ago.   Active Problems:   Septic arthritis of knee, right (HCC)  Estimated body mass index is 46.13 kg/m as calculated from the following:   Height as of this encounter: 5\' 8"  (1.727 m).   Weight as of this encounter: 137.6 kg (303 lb 6.4 oz). Discharge home with IV ABX vix PICC Diet - Cardiac diet Follow up - in 2 weeks Activity - WBAT Disposition - Home Condition Upon Discharge - Stable D/C Meds - See DC Summary DVT Prophylaxis - Eliquis  , PA-C Orthopaedic Surgery 10/04/2016, 9:32 AM

## 2016-10-08 LAB — AEROBIC/ANAEROBIC CULTURE (SURGICAL/DEEP WOUND): CULTURE: NO GROWTH

## 2016-10-08 LAB — AEROBIC/ANAEROBIC CULTURE W GRAM STAIN (SURGICAL/DEEP WOUND)

## 2016-10-24 ENCOUNTER — Ambulatory Visit (INDEPENDENT_AMBULATORY_CARE_PROVIDER_SITE_OTHER): Payer: 59 | Admitting: Internal Medicine

## 2016-10-24 ENCOUNTER — Telehealth: Payer: Self-pay | Admitting: *Deleted

## 2016-10-24 ENCOUNTER — Encounter: Payer: Self-pay | Admitting: Internal Medicine

## 2016-10-24 VITALS — BP 143/85 | HR 72 | Temp 97.5°F | Ht 68.0 in | Wt 309.0 lb

## 2016-10-24 DIAGNOSIS — M069 Rheumatoid arthritis, unspecified: Secondary | ICD-10-CM | POA: Diagnosis not present

## 2016-10-24 DIAGNOSIS — T8453XD Infection and inflammatory reaction due to internal right knee prosthesis, subsequent encounter: Secondary | ICD-10-CM | POA: Diagnosis not present

## 2016-10-24 DIAGNOSIS — Z889 Allergy status to unspecified drugs, medicaments and biological substances status: Secondary | ICD-10-CM | POA: Diagnosis not present

## 2016-10-24 MED ORDER — AMOXICILLIN-POT CLAVULANATE 875-125 MG PO TABS: 1.0000 | ORAL_TABLET | Freq: Two times a day (BID) | ORAL | 5 refills | Status: DC

## 2016-10-24 NOTE — Assessment & Plan Note (Signed)
He has allergies related to rash and may be pcn or sulfa but no hives and very mild rash.   I discussed options with him including trial of augmentin or bactrim to test if he truly is allergic and if he develops any rash to stop.  No concerning history and he will need continuation therapy for his knee to have a higher chance of cure and keep from needing amputation in the future.

## 2016-10-24 NOTE — Assessment & Plan Note (Addendum)
Improving.  Will complete 6 weeks of daptomycin and  Transition to Augmentin.  With his mild allergy history and unclear if true allergy, I do feel the benefits of treatment with Augmentin outweigh the risks in order to effectively treat the infection, avoid further infection.  He knows to call and stop if he gets any rash or has any concerns. He understands the risks and benefits and opted to try the antibiotic.

## 2016-10-24 NOTE — Telephone Encounter (Signed)
Left a voice mail with [facility] [phone] and asked to return call. Patient can have his picc line pulled on 11/19/16 per Dr. Luciana Axe. [name]

## 2016-10-24 NOTE — Assessment & Plan Note (Signed)
He is currently off any immunosuppression at this time ad stable with pain.  He is discussing with his rheumatologist optimal timing of restarting.

## 2016-10-24 NOTE — Progress Notes (Signed)
   Subjective:    Patient ID: [name], male    DOB: September 24, 1961, 56 y.o.   MRN: [id number]  HPI Here for hsfu.  [name] is a 56 y.o. 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  He again developed PJI with Staph lugdunensis earlier this year and treated with daptomycin after developing a vancomycin allergy. He completed 6 weeks and stopped after that. Previously had resection arthroplasty and on vancomycin.   Has a history of pcn allergy and sulfa allergy with rash, itching but pcn was 35 years ago.     Review of Systems  Constitutional: Negative for chills and fever.  Gastrointestinal: Negative for diarrhea and nausea.  Skin: Negative for rash.  Neurological: Negative for dizziness.       Objective:   Physical Exam  Constitutional: He appears well-developed and well-nourished. No distress.  Eyes: No scleral icterus.  Cardiovascular: Normal rate, regular rhythm and normal heart sounds.   No murmur heard. Pulmonary/Chest: Effort normal and breath sounds normal. No respiratory distress.  Musculoskeletal:  picc line looks good, no erythema  Skin: No rash noted.   SH: no tobacco       Assessment & Plan:

## 2016-10-25 NOTE — Telephone Encounter (Signed)
No return call from voice mail. Called back and left verbal order to pull picc line per Dr. Luciana Axe on 11/19/16. [name]

## 2016-11-06 NOTE — Telephone Encounter (Signed)
Patient's wife called, concerned because it seems that [facility] never got the order to continue antibiotics until 2/5.  Per chart review, Annice Pih attempted to call twice, had to leave a message with verbal orders.  RN contacted [facility] today, spoke with manager Fontaine No.  RN gave verbal order per Dr Ephriam Knuckles notes to continue the IV antibiotics through 2/5, then ok to pull PICC.  RN left message with patient to let them know it was completed. Andree Coss, RN

## 2016-11-19 ENCOUNTER — Telehealth: Payer: Self-pay | Admitting: *Deleted

## 2016-11-19 NOTE — Telephone Encounter (Signed)
Patient's wife called to find out if appt for 11/22/16 should be changed because the daptomycin was extended for a week and his picc will be pulled on 11/21/16. He will have taken two doses of the augmentin prior to appt and because they are concerned about an allergic reaction, I advised them to keep the scheduled appt.

## 2016-11-19 NOTE — Telephone Encounter (Signed)
Appointment changed to 11/27/16 and patient notified.

## 2016-11-19 NOTE — Telephone Encounter (Signed)
I think it would be good to push it back a week on the augmentin thanks

## 2016-11-22 ENCOUNTER — Ambulatory Visit: Payer: 59 | Admitting: Internal Medicine

## 2016-11-27 ENCOUNTER — Ambulatory Visit (INDEPENDENT_AMBULATORY_CARE_PROVIDER_SITE_OTHER): Payer: 59 | Admitting: Internal Medicine

## 2016-11-27 VITALS — Wt 310.0 lb

## 2016-11-27 DIAGNOSIS — Z889 Allergy status to unspecified drugs, medicaments and biological substances status: Secondary | ICD-10-CM | POA: Diagnosis not present

## 2016-11-27 DIAGNOSIS — Z5181 Encounter for therapeutic drug level monitoring: Secondary | ICD-10-CM

## 2016-11-27 DIAGNOSIS — T8453XD Infection and inflammatory reaction due to internal right knee prosthesis, subsequent encounter: Secondary | ICD-10-CM

## 2016-11-27 DIAGNOSIS — M069 Rheumatoid arthritis, unspecified: Secondary | ICD-10-CM

## 2016-11-27 LAB — COMPREHENSIVE METABOLIC PANEL
ALT: 10 U/L (ref 9–46)
AST: 16 U/L (ref 10–35)
Albumin: 3.6 g/dL (ref 3.6–5.1)
Alkaline Phosphatase: 67 U/L (ref 40–115)
BILIRUBIN TOTAL: 0.4 mg/dL (ref 0.2–1.2)
BUN: 10 mg/dL (ref 7–25)
CHLORIDE: 105 mmol/L (ref 98–110)
CO2: 24 mmol/L (ref 20–31)
CREATININE: 1.03 mg/dL (ref 0.70–1.33)
Calcium: 8.8 mg/dL (ref 8.6–10.3)
GLUCOSE: 109 mg/dL — AB (ref 65–99)
Potassium: 4.4 mmol/L (ref 3.5–5.3)
SODIUM: 139 mmol/L (ref 135–146)
Total Protein: 7.3 g/dL (ref 6.1–8.1)

## 2016-11-27 LAB — CBC WITH DIFFERENTIAL/PLATELET
Basophils Absolute: 0 cells/uL (ref 0–200)
Basophils Relative: 0 %
EOS PCT: 5 %
Eosinophils Absolute: 355 cells/uL (ref 15–500)
HCT: 38.4 % — ABNORMAL LOW (ref 38.5–50.0)
Hemoglobin: 12.3 g/dL — ABNORMAL LOW (ref 13.2–17.1)
LYMPHS PCT: 20 %
Lymphs Abs: 1420 cells/uL (ref 850–3900)
MCH: 25.7 pg — AB (ref 27.0–33.0)
MCHC: 32 g/dL (ref 32.0–36.0)
MCV: 80.3 fL (ref 80.0–100.0)
MONOS PCT: 4 %
MPV: 8.6 fL (ref 7.5–12.5)
Monocytes Absolute: 284 cells/uL (ref 200–950)
NEUTROS PCT: 71 %
Neutro Abs: 5041 cells/uL (ref 1500–7800)
Platelets: 364 10*3/uL (ref 140–400)
RBC: 4.78 MIL/uL (ref 4.20–5.80)
RDW: 15.3 % — AB (ref 11.0–15.0)
WBC: 7.1 10*3/uL (ref 3.8–10.8)

## 2016-11-27 NOTE — Assessment & Plan Note (Signed)
Will check cbc, cmp to be sure no side effects from medication

## 2016-11-27 NOTE — Assessment & Plan Note (Signed)
He is tolerating penicillin well now so I will remove this from his allergy list

## 2016-11-27 NOTE — Assessment & Plan Note (Signed)
From an infectious disease standpoint I feel it is reasonable to restart methotrexate if indicated

## 2016-11-27 NOTE — Progress Notes (Signed)
   Subjective:    Patient ID: [name], male    DOB: [birth year], 56 y.o.   MRN: [id number]  HPI Here for hsfu.  [name] is a 56 y.o. 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  He again developed PJI with Staph lugdunensis earlier this year and treated with daptomycin after developing a vancomycin allergy. He completed 6 weeks and stopped after that.  With allergy history no oral continuation therapy was done.  Previously had resection arthroplasty and on vancomycin then.  He came back in December 2017 with knee pain, swelling c/w PJI again. No culture grew and treated for 6 more weeks with daptomycin.   Has a history of pcn allergy and sulfa allergy with rash, itching but pcn was 35 years ago.    After discussion with him regarding his allergies, we opted to start Augmentin after completing his 6 weeks of daptomycin for continuation therapy.  He has been on that about 1 week and no issues.  No associated n/v/d, no rash, no hives.  Knee with some swelling and sees Dr. Maureen Ralphs today.  Has not had concerns for infection.    Review of Systems  Gastrointestinal: Negative for diarrhea and nausea.  Skin: Negative for rash.  Neurological: Negative for dizziness.       Objective:   Physical Exam  Constitutional: He appears well-developed and well-nourished. No distress.  Eyes: No scleral icterus.  Cardiovascular: Normal rate, regular rhythm and normal heart sounds.   No murmur heard. Pulmonary/Chest: Effort normal and breath sounds normal. No respiratory distress.  Skin: No rash noted.   SH: no tobacco       Assessment & Plan:

## 2016-11-27 NOTE — Assessment & Plan Note (Signed)
Now on oral continuation therapy.  Will continue at least 3 months as long as he tolerates.  May do longer depending on inflammatory markers.

## 2016-11-28 LAB — SEDIMENTATION RATE: SED RATE: 53 mm/h — AB (ref 0–20)

## 2016-11-28 LAB — C-REACTIVE PROTEIN: CRP: 66.5 mg/L — AB (ref <8.0)

## 2017-02-12 ENCOUNTER — Ambulatory Visit (INDEPENDENT_AMBULATORY_CARE_PROVIDER_SITE_OTHER): Payer: 59 | Admitting: Internal Medicine

## 2017-02-12 ENCOUNTER — Encounter: Payer: Self-pay | Admitting: Internal Medicine

## 2017-02-12 VITALS — BP 161/89 | HR 74 | Temp 98.1°F | Ht 68.0 in | Wt 315.0 lb

## 2017-02-12 DIAGNOSIS — Z889 Allergy status to unspecified drugs, medicaments and biological substances status: Secondary | ICD-10-CM | POA: Diagnosis not present

## 2017-02-12 DIAGNOSIS — M069 Rheumatoid arthritis, unspecified: Secondary | ICD-10-CM | POA: Diagnosis not present

## 2017-02-12 DIAGNOSIS — T8453XD Infection and inflammatory reaction due to internal right knee prosthesis, subsequent encounter: Secondary | ICD-10-CM

## 2017-02-12 LAB — COMPREHENSIVE METABOLIC PANEL
ALBUMIN: 3.6 g/dL (ref 3.6–5.1)
ALK PHOS: 65 U/L (ref 40–115)
ALT: 10 U/L (ref 9–46)
AST: 16 U/L (ref 10–35)
BUN: 16 mg/dL (ref 7–25)
CALCIUM: 8.5 mg/dL — AB (ref 8.6–10.3)
CHLORIDE: 105 mmol/L (ref 98–110)
CO2: 22 mmol/L (ref 20–31)
Creat: 1.02 mg/dL (ref 0.70–1.33)
Glucose, Bld: 92 mg/dL (ref 65–99)
POTASSIUM: 4.3 mmol/L (ref 3.5–5.3)
Sodium: 138 mmol/L (ref 135–146)
TOTAL PROTEIN: 6.9 g/dL (ref 6.1–8.1)
Total Bilirubin: 0.3 mg/dL (ref 0.2–1.2)

## 2017-02-12 MED ORDER — AMOXICILLIN-POT CLAVULANATE 875-125 MG PO TABS: 1.0000 | ORAL_TABLET | Freq: Two times a day (BID) | ORAL | 2 refills | Status: DC

## 2017-02-12 NOTE — Assessment & Plan Note (Signed)
He continues to tolerate amoxicillin now.

## 2017-02-12 NOTE — Progress Notes (Signed)
   Subjective:    Patient ID: [name], male    DOB: December 06, 1960, 56 y.o.   MRN: [id number]  HPI Here for hsfu.  [name] is a 56 y.o. 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  He again developed PJI with Staph lugdunensis earlier this year and treated with daptomycin after developing a vancomycin allergy. He completed 6 weeks and stopped after that.  With allergy history no oral continuation therapy was done.  Previously had resection arthroplasty and on vancomycin then.  He came back in December 2017 with knee pain, swelling c/w PJI again. No culture grew and treated for 6 more weeks with daptomycin.   Has a history of pcn allergy and sulfa allergy with rash, itching but pcn was 35 years ago.   I started him on Augmentin prior to last visit and he continues to tolerate it well, no rash, no diarrhea.  No associated n/v. Knee has fluid on it and has been getting aspiration by Dr. Maureen Ralphs about every 2 weeks.  No signs of infection though.  Went to his dentist who did not want to clean his teeth despite being on augmentin thinking he needed further prophylaxis (he doesn't).     Review of Systems  Gastrointestinal: Negative for diarrhea and nausea.  Skin: Negative for rash.  Neurological: Negative for dizziness.       Objective:   Physical Exam  Constitutional: He appears well-developed and well-nourished. No distress.  Eyes: No scleral icterus.  Cardiovascular: Normal rate, regular rhythm and normal heart sounds.   No murmur heard. Pulmonary/Chest: Effort normal and breath sounds normal. No respiratory distress.  Some congestion with allergies  Skin: No rash noted.   SH: no tobacco       Assessment & Plan:

## 2017-02-12 NOTE — Assessment & Plan Note (Signed)
Doing well on threapy with Augmentin.  Inflammatory markers last visit were high (in the setting of RA) and will check again today.  He will continue with Augmentin likely 2-3 more months and consider stopping then.

## 2017-02-12 NOTE — Assessment & Plan Note (Signed)
Likely cause of his elevated inflammatory markers.  Now on methotrexate.

## 2017-02-13 LAB — C-REACTIVE PROTEIN: CRP: 56.8 mg/L — AB (ref <8.0)

## 2017-02-13 LAB — SEDIMENTATION RATE: SED RATE: 36 mm/h — AB (ref 0–20)

## 2017-02-21 NOTE — Progress Notes (Signed)
Please place orders in EPIC as patient is being scheduled for a pre-op appointment! Thank you! 

## 2017-02-28 ENCOUNTER — Ambulatory Visit: Payer: Self-pay | Admitting: Orthopedic Surgery

## 2017-02-28 NOTE — Progress Notes (Signed)
Wife called to inform nurse that patient is going to see [facility] tomorrow 03/01/2017 for coughing,congestion ,etc. Wife will call back if not having surgery on 03/13/2017.

## 2017-03-06 NOTE — Patient Instructions (Addendum)
[name]  03/06/2017   Your procedure is scheduled on: 03/13/17  Report to [facility] Main  Entrance             Report to admitting at     1215 PM   Call this number if you have problems the morning of surgery  [phone]   Remember: ONLY 1 PERSON MAY GO WITH YOU TO SHORT STAY TO GET  READY MORNING OF YOUR SURGERY.  Do not eat food  :After Midnight.  tou may have clear liquids until 0845 am then NOTHING BY MOUTH     Take these medicines the morning of surgery with A SIP OF WATER: Rabeprazole, metoprolol, inhalers if needed and bring, diltiazem, amoxicillin                              You may not have any metal on your body including hair pins and              piercings  Do not wear jewelry, lotions, powders or perfumes, deodorant                        Men may shave face and neck.   Do not bring valuables to the hospital. [name] IS NOT             RESPONSIBLE   FOR VALUABLES.  Contacts, dentures or bridgework may not be worn into surgery.  Leave suitcase in the car. After surgery it may be brought to your room.                 Please read over the following fact sheets you were given: _____________________________________________________________________            [facility] - Preparing for Surgery Before surgery, you can play an important role.  Because skin is not sterile, your skin needs to be as free of germs as possible.  You can reduce the number of germs on your skin by washing with CHG (chlorahexidine gluconate) soap before surgery.  CHG is an antiseptic cleaner which kills germs and bonds with the skin to continue killing germs even after washing. Please DO NOT use if you have an allergy to CHG or antibacterial soaps.  If your skin becomes reddened/irritated stop using the CHG and inform your nurse when you arrive at Short Stay. Do not shave (including legs and underarms) for at least 48 hours prior to the first CHG shower.  You may  shave your face/neck. Please follow these instructions carefully:  1.  Shower with CHG Soap the night before surgery and the  morning of Surgery.  2.  If you choose to wash your hair, wash your hair first as usual with your  normal  shampoo.  3.  After you shampoo, rinse your hair and body thoroughly to remove the  shampoo.                           4.  Use CHG as you would any other liquid soap.  You can apply chg directly  to the skin and wash                       Gently with a scrungie or clean washcloth.  5.  Apply the CHG Soap to your body ONLY FROM THE NECK DOWN.   Do not use on face/ open                           Wound or open sores. Avoid contact with eyes, ears mouth and genitals (private parts).                       Wash face,  Genitals (private parts) with your normal soap.             6.  Wash thoroughly, paying special attention to the area where your surgery  will be performed.  7.  Thoroughly rinse your body with warm water from the neck down.  8.  DO NOT shower/wash with your normal soap after using and rinsing off  the CHG Soap.                9.  Pat yourself dry with a clean towel.            10.  Wear clean pajamas.            11.  Place clean sheets on your bed the night of your first shower and do not  sleep with pets. Day of Surgery : Do not apply any lotions/deodorants the morning of surgery.  Please wear clean clothes to the hospital/surgery center.  FAILURE TO FOLLOW THESE INSTRUCTIONS MAY RESULT IN THE CANCELLATION OF YOUR SURGERY PATIENT SIGNATURE_________________________________  NURSE SIGNATURE__________________________________  ________________________________________________________________________   [name]  An incentive spirometer is a tool that can help keep your lungs clear and active. This tool measures how well you are filling your lungs with each breath. Taking long deep breaths may help reverse or decrease the chance of developing  breathing (pulmonary) problems (especially infection) following:  A long period of time when you are unable to move or be active. BEFORE THE PROCEDURE   If the spirometer includes an indicator to show your best effort, your nurse or respiratory therapist will set it to a desired goal.  If possible, sit up straight or lean slightly forward. Try not to slouch.  Hold the incentive spirometer in an upright position. INSTRUCTIONS FOR USE  1. Sit on the edge of your bed if possible, or sit up as far as you can in bed or on a chair. 2. Hold the incentive spirometer in an upright position. 3. Breathe out normally. 4. Place the mouthpiece in your mouth and seal your lips tightly around it. 5. Breathe in slowly and as deeply as possible, raising the piston or the ball toward the top of the column. 6. Hold your breath for 3-5 seconds or for as long as possible. Allow the piston or ball to fall to the bottom of the column. 7. Remove the mouthpiece from your mouth and breathe out normally. 8. Rest for a few seconds and repeat Steps 1 through 7 at least 10 times every 1-2 hours when you are awake. Take your time and take a few normal breaths between deep breaths. 9. The spirometer may include an indicator to show your best effort. Use the indicator as a goal to work toward during each repetition. 10. After each set of 10 deep breaths, practice coughing to be sure your lungs are clear. If you have an incision (the cut made at the time of surgery), support your incision when coughing by placing a  pillow or rolled up towels firmly against it. Once you are able to get out of bed, walk around indoors and cough well. You may stop using the incentive spirometer when instructed by your caregiver.  RISKS AND COMPLICATIONS  Take your time so you do not get dizzy or light-headed.  If you are in pain, you may need to take or ask for pain medication before doing incentive spirometry. It is harder to take a deep  breath if you are having pain. AFTER USE  Rest and breathe slowly and easily.  It can be helpful to keep track of a log of your progress. Your caregiver can provide you with a simple table to help with this. If you are using the spirometer at home, follow these instructions: SEEK MEDICAL CARE IF:   You are having difficultly using the spirometer.  You have trouble using the spirometer as often as instructed.  Your pain medication is not giving enough relief while using the spirometer.  You develop fever of 100.5 F (38.1 C) or higher. SEEK IMMEDIATE MEDICAL CARE IF:   You cough up bloody sputum that had not been present before.  You develop fever of 102 F (38.9 C) or greater.  You develop worsening pain at or near the incision site. MAKE SURE YOU:   Understand these instructions.  Will watch your condition.  Will get help right away if you are not doing well or get worse. Document Released: 02/11/2007 Document Revised: 12/24/2011 Document Reviewed: 04/14/2007 ExitCare Patient Information 2014 ExitCare, [place].   ________________________________________________________________________    CLEAR LIQUID DIET   Foods Allowed                                                                     Foods Excluded  Coffee and tea, regular and decaf                             liquids that you cannot  Plain Jell-O in any flavor                                             see through such as: Fruit ices (not with fruit pulp)                                     milk, soups, orange juice  Iced Popsicles                                    All solid food Carbonated beverages, regular and diet                                    Cranberry, grape and apple juices Sports drinks like Gatorade Lightly seasoned clear broth or consume(fat free) Sugar, honey syrup  Sample Menu Breakfast  Lunch                                     Supper Cranberry juice                     Beef broth                            Chicken broth Jell-O                                     Grape juice                           Apple juice Coffee or tea                        Jell-O                                      Popsicle                                                Coffee or tea                        Coffee or tea  _____________________________________________________________________

## 2017-03-07 ENCOUNTER — Encounter (HOSPITAL_COMMUNITY): Payer: Self-pay

## 2017-03-07 ENCOUNTER — Encounter (HOSPITAL_COMMUNITY)
Admission: RE | Admit: 2017-03-07 | Discharge: 2017-03-07 | Disposition: A | Discharge status: Home / Self Care | Payer: 59 | Source: Ambulatory Visit | Attending: Orthopedic Surgery | Admitting: Orthopedic Surgery

## 2017-03-07 DIAGNOSIS — Z01812 Encounter for preprocedural laboratory examination: Secondary | ICD-10-CM | POA: Insufficient documentation

## 2017-03-07 DIAGNOSIS — M25561 Pain in right knee: Secondary | ICD-10-CM | POA: Diagnosis not present

## 2017-03-07 HISTORY — DX: Pneumonia, unspecified organism: J18.9

## 2017-03-07 HISTORY — DX: Anemia, unspecified: D64.9

## 2017-03-07 LAB — CBC
HEMATOCRIT: 38.1 % — AB (ref 39.0–52.0)
HEMOGLOBIN: 12.1 g/dL — AB (ref 13.0–17.0)
MCH: 26 pg (ref 26.0–34.0)
MCHC: 31.8 g/dL (ref 30.0–36.0)
MCV: 81.9 fL (ref 78.0–100.0)
Platelets: 358 10*3/uL (ref 150–400)
RBC: 4.65 MIL/uL (ref 4.22–5.81)
RDW: 16.9 % — ABNORMAL HIGH (ref 11.5–15.5)
WBC: 12 10*3/uL — ABNORMAL HIGH (ref 4.0–10.5)

## 2017-03-07 NOTE — Progress Notes (Signed)
cmp 02/12/17 epic   04/25/16 SR epic

## 2017-03-13 ENCOUNTER — Encounter (HOSPITAL_COMMUNITY)
Admission: RE | Disposition: A | Discharge status: Home / Self Care | Payer: Self-pay | Source: Ambulatory Visit | Attending: Orthopedic Surgery

## 2017-03-13 ENCOUNTER — Ambulatory Visit (HOSPITAL_COMMUNITY)
Admission: RE | Admit: 2017-03-13 | Discharge: 2017-03-13 | Disposition: A | Discharge status: Home / Self Care | Payer: 59 | Source: Ambulatory Visit | Attending: Orthopedic Surgery | Admitting: Orthopedic Surgery

## 2017-03-13 ENCOUNTER — Ambulatory Visit (HOSPITAL_COMMUNITY): Payer: 59 | Admitting: Registered Nurse

## 2017-03-13 ENCOUNTER — Encounter (HOSPITAL_COMMUNITY): Payer: Self-pay

## 2017-03-13 DIAGNOSIS — Z79899 Other long term (current) drug therapy: Secondary | ICD-10-CM | POA: Insufficient documentation

## 2017-03-13 DIAGNOSIS — Z6841 Body Mass Index (BMI) 40.0 and over, adult: Secondary | ICD-10-CM | POA: Insufficient documentation

## 2017-03-13 DIAGNOSIS — J45909 Unspecified asthma, uncomplicated: Secondary | ICD-10-CM | POA: Insufficient documentation

## 2017-03-13 DIAGNOSIS — Z7951 Long term (current) use of inhaled steroids: Secondary | ICD-10-CM | POA: Insufficient documentation

## 2017-03-13 DIAGNOSIS — M069 Rheumatoid arthritis, unspecified: Secondary | ICD-10-CM | POA: Insufficient documentation

## 2017-03-13 DIAGNOSIS — M7041 Prepatellar bursitis, right knee: Secondary | ICD-10-CM | POA: Insufficient documentation

## 2017-03-13 DIAGNOSIS — I10 Essential (primary) hypertension: Secondary | ICD-10-CM | POA: Insufficient documentation

## 2017-03-13 DIAGNOSIS — Z87891 Personal history of nicotine dependence: Secondary | ICD-10-CM | POA: Insufficient documentation

## 2017-03-13 DIAGNOSIS — Z96653 Presence of artificial knee joint, bilateral: Secondary | ICD-10-CM | POA: Insufficient documentation

## 2017-03-13 HISTORY — PX: KNEE BURSECTOMY: SHX5882

## 2017-03-13 SURGERY — BURSECTOMY, KNEE
Anesthesia: General | Site: Knee | Laterality: Right

## 2017-03-13 MED ORDER — OXYCODONE HCL 5 MG PO TABS: 5.0000 mg | ORAL_TABLET | Freq: Three times a day (TID) | ORAL | 0 refills | Status: DC | PRN

## 2017-03-13 MED ORDER — CLINDAMYCIN PHOSPHATE 900 MG/50ML IV SOLN
INTRAVENOUS | Status: AC
  Filled 2017-03-13: qty 50

## 2017-03-13 MED ORDER — DEXAMETHASONE SODIUM PHOSPHATE 10 MG/ML IJ SOLN
INTRAMUSCULAR | Status: DC | PRN
  Administered 2017-03-13: 10 mg via INTRAVENOUS

## 2017-03-13 MED ORDER — FENTANYL CITRATE (PF) 100 MCG/2ML IJ SOLN
25.0000 ug | INTRAMUSCULAR | Status: DC | PRN
  Administered 2017-03-13 (×2): 25 ug via INTRAVENOUS

## 2017-03-13 MED ORDER — ONDANSETRON HCL 4 MG/2ML IJ SOLN
INTRAMUSCULAR | Status: DC | PRN
  Administered 2017-03-13: 4 mg via INTRAVENOUS

## 2017-03-13 MED ORDER — SUCCINYLCHOLINE CHLORIDE 20 MG/ML IJ SOLN
INTRAMUSCULAR | Status: DC | PRN
  Administered 2017-03-13: 100 mg via INTRAVENOUS

## 2017-03-13 MED ORDER — FENTANYL CITRATE (PF) 100 MCG/2ML IJ SOLN
INTRAMUSCULAR | Status: AC
  Filled 2017-03-13: qty 4

## 2017-03-13 MED ORDER — MIDAZOLAM HCL 5 MG/5ML IJ SOLN
INTRAMUSCULAR | Status: DC | PRN
  Administered 2017-03-13: 2 mg via INTRAVENOUS

## 2017-03-13 MED ORDER — TRAMADOL HCL 50 MG PO TABS: 50.0000 mg | ORAL_TABLET | Freq: Four times a day (QID) | ORAL | 1 refills | Status: DC | PRN

## 2017-03-13 MED ORDER — CLINDAMYCIN PHOSPHATE 900 MG/50ML IV SOLN
900.0000 mg | INTRAVENOUS | Status: AC
  Administered 2017-03-13: 900 mg via INTRAVENOUS

## 2017-03-13 MED ORDER — LIDOCAINE 2% (20 MG/ML) 5 ML SYRINGE
INTRAMUSCULAR | Status: DC | PRN
  Administered 2017-03-13: 100 mg via INTRAVENOUS

## 2017-03-13 MED ORDER — DEXTROSE 5 % IV SOLN
500.0000 mg | Freq: Once | INTRAVENOUS | Status: AC
  Administered 2017-03-13: 500 mg via INTRAVENOUS
  Filled 2017-03-13: qty 550

## 2017-03-13 MED ORDER — 0.9 % SODIUM CHLORIDE (POUR BTL) OPTIME
TOPICAL | Status: DC | PRN
  Administered 2017-03-13: 1000 mL

## 2017-03-13 MED ORDER — CHLORHEXIDINE GLUCONATE 4 % EX LIQD: 60.0000 mL | Freq: Once | CUTANEOUS | Status: DC

## 2017-03-13 MED ORDER — METOCLOPRAMIDE HCL 5 MG/ML IJ SOLN: 10.0000 mg | Freq: Once | INTRAMUSCULAR | Status: DC | PRN

## 2017-03-13 MED ORDER — PROPOFOL 10 MG/ML IV BOLUS
INTRAVENOUS | Status: AC
  Filled 2017-03-13: qty 20

## 2017-03-13 MED ORDER — DEXAMETHASONE SODIUM PHOSPHATE 10 MG/ML IJ SOLN: 10.0000 mg | Freq: Once | INTRAMUSCULAR | Status: DC

## 2017-03-13 MED ORDER — ACETAMINOPHEN 10 MG/ML IV SOLN
1000.0000 mg | Freq: Once | INTRAVENOUS | Status: AC
  Administered 2017-03-13: 1000 mg via INTRAVENOUS

## 2017-03-13 MED ORDER — LACTATED RINGERS IV SOLN: INTRAVENOUS | Status: DC

## 2017-03-13 MED ORDER — PROPOFOL 10 MG/ML IV BOLUS
INTRAVENOUS | Status: DC | PRN
  Administered 2017-03-13: 150 mg via INTRAVENOUS

## 2017-03-13 MED ORDER — OXYCODONE HCL 5 MG PO TABS
5.0000 mg | ORAL_TABLET | Freq: Three times a day (TID) | ORAL | Status: DC | PRN
  Administered 2017-03-13: 5 mg via ORAL
  Filled 2017-03-13: qty 1

## 2017-03-13 MED ORDER — FENTANYL CITRATE (PF) 100 MCG/2ML IJ SOLN
INTRAMUSCULAR | Status: DC | PRN
  Administered 2017-03-13 (×2): 50 ug via INTRAVENOUS

## 2017-03-13 MED ORDER — POVIDONE-IODINE 10 % EX SWAB
2.0000 "application " | Freq: Once | CUTANEOUS | Status: AC
  Administered 2017-03-13: 2 via TOPICAL

## 2017-03-13 MED ORDER — FENTANYL CITRATE (PF) 100 MCG/2ML IJ SOLN
INTRAMUSCULAR | Status: AC
  Filled 2017-03-13: qty 2

## 2017-03-13 MED ORDER — LACTATED RINGERS IV SOLN
INTRAVENOUS | Status: DC
  Administered 2017-03-13: 1000 mL via INTRAVENOUS
  Administered 2017-03-13: 16:00:00 via INTRAVENOUS

## 2017-03-13 MED ORDER — MIDAZOLAM HCL 2 MG/2ML IJ SOLN
INTRAMUSCULAR | Status: AC
  Filled 2017-03-13: qty 2

## 2017-03-13 MED ORDER — MEPERIDINE HCL 50 MG/ML IJ SOLN: 6.2500 mg | INTRAMUSCULAR | Status: DC | PRN

## 2017-03-13 MED ORDER — ACETAMINOPHEN 10 MG/ML IV SOLN
INTRAVENOUS | Status: AC
  Filled 2017-03-13: qty 100

## 2017-03-13 SURGICAL SUPPLY — 45 items
BAG SPEC THK2 15X12 ZIP CLS (MISCELLANEOUS) ×1
BAG ZIPLOCK 12X15 (MISCELLANEOUS) ×3 IMPLANT
BANDAGE ACE 6X5 VEL STRL LF (GAUZE/BANDAGES/DRESSINGS) ×3 IMPLANT
BNDG COHESIVE 4X5 TAN STRL (GAUZE/BANDAGES/DRESSINGS) ×3 IMPLANT
BNDG GAUZE ELAST 4 BULKY (GAUZE/BANDAGES/DRESSINGS) ×3 IMPLANT
COVER SURGICAL LIGHT HANDLE (MISCELLANEOUS) ×3 IMPLANT
CUFF TOURN SGL QUICK 44 (TOURNIQUET CUFF) ×2 IMPLANT
DRAPE INCISE IOBAN 66X45 STRL (DRAPES) ×3 IMPLANT
DRSG ADAPTIC 3X8 NADH LF (GAUZE/BANDAGES/DRESSINGS) ×2 IMPLANT
DRSG EMULSION OIL 3X16 NADH (GAUZE/BANDAGES/DRESSINGS) ×3 IMPLANT
DRSG PAD ABDOMINAL 8X10 ST (GAUZE/BANDAGES/DRESSINGS) ×3 IMPLANT
DURAPREP 26ML APPLICATOR (WOUND CARE) ×3 IMPLANT
ELECT REM PT RETURN 15FT ADLT (MISCELLANEOUS) ×3 IMPLANT
EVACUATOR 1/8 PVC DRAIN (DRAIN) ×2 IMPLANT
GAUZE SPONGE 4X4 12PLY STRL (GAUZE/BANDAGES/DRESSINGS) ×3 IMPLANT
GLOVE BIO SURGEON STRL SZ7.5 (GLOVE) ×3 IMPLANT
GLOVE BIOGEL PI IND STRL 7.5 (GLOVE) IMPLANT
GLOVE BIOGEL PI IND STRL 8 (GLOVE) ×1 IMPLANT
GLOVE BIOGEL PI IND STRL 8.5 (GLOVE) ×1 IMPLANT
GLOVE BIOGEL PI INDICATOR 7.5 (GLOVE) ×6
GLOVE BIOGEL PI INDICATOR 8 (GLOVE) ×2
GLOVE BIOGEL PI INDICATOR 8.5 (GLOVE) ×2
GOWN STRL REUS W/TWL LRG LVL3 (GOWN DISPOSABLE) ×4 IMPLANT
GOWN STRL REUS W/TWL XL LVL3 (GOWN DISPOSABLE) ×6 IMPLANT
IMMOBILIZER KNEE 20 (SOFTGOODS)
IMMOBILIZER KNEE 20 THIGH 36 (SOFTGOODS) IMPLANT
KIT BASIN OR (CUSTOM PROCEDURE TRAY) ×3 IMPLANT
MANIFOLD NEPTUNE II (INSTRUMENTS) ×3 IMPLANT
NDL SAFETY ECLIPSE 18X1.5 (NEEDLE) ×1 IMPLANT
NEEDLE HYPO 18GX1.5 SHARP (NEEDLE) ×3
PACK ORTHO EXTREMITY (CUSTOM PROCEDURE TRAY) ×3 IMPLANT
PAD ABD 8X10 STRL (GAUZE/BANDAGES/DRESSINGS) ×2 IMPLANT
PAD CAST 4YDX4 CTTN HI CHSV (CAST SUPPLIES) ×1 IMPLANT
PADDING CAST COTTON 4X4 STRL (CAST SUPPLIES) ×3
PADDING CAST COTTON 6X4 STRL (CAST SUPPLIES) ×4 IMPLANT
POSITIONER SURGICAL ARM (MISCELLANEOUS) ×3 IMPLANT
SPONGE LAP 4X18 X RAY DECT (DISPOSABLE) ×3 IMPLANT
SUT VIC AB 1 CT1 27 (SUTURE) ×3
SUT VIC AB 1 CT1 27XBRD ANTBC (SUTURE) ×1 IMPLANT
SUT VIC AB 2-0 CT1 27 (SUTURE) ×6
SUT VIC AB 2-0 CT1 27XBRD (SUTURE) ×1 IMPLANT
SUT VIC AB 2-0 CT1 TAPERPNT 27 (SUTURE) IMPLANT
SYR 50ML LL SCALE MARK (SYRINGE) ×3 IMPLANT
TOWEL OR 17X26 10 PK STRL BLUE (TOWEL DISPOSABLE) ×6 IMPLANT
WRAP KNEE MAXI GEL POST OP (GAUZE/BANDAGES/DRESSINGS) ×2 IMPLANT

## 2017-03-13 NOTE — Interval H&P Note (Signed)
History and Physical Interval Note:  03/13/2017 3:11 PM  [name]  has presented today for surgery, with the diagnosis of Right knee prepatllar bursitis   The various methods of treatment have been discussed with the patient and family. After consideration of risks, benefits and other options for treatment, the patient has consented to  Procedure(s): RIGHT KNEE PREPATELLAR BURSECTOMY (Right) as a surgical intervention .  The patient's history has been reviewed, patient examined, no change in status, stable for surgery.  I have reviewed the patient's chart and labs.  Questions were answered to the patient's satisfaction.     [name]

## 2017-03-13 NOTE — Anesthesia Procedure Notes (Signed)
Procedure Name: Intubation Date/Time: 03/13/2017 3:30 PM Performed by: West Pugh Pre-anesthesia Checklist: Patient identified, Emergency Drugs available, Suction available, Patient being monitored and Timeout performed Patient Re-evaluated:Patient Re-evaluated prior to inductionOxygen Delivery Method: Circle system utilized Preoxygenation: Pre-oxygenation with 100% oxygen Intubation Type: IV induction and Cricoid Pressure applied Ventilation: Mask ventilation without difficulty Laryngoscope Size: Mac and 4 Grade View: Grade I Tube type: Oral Tube size: 7.5 mm Number of attempts: 1 Airway Equipment and Method: Stylet Placement Confirmation: ETT inserted through vocal cords under direct vision,  positive ETCO2,  CO2 detector and breath sounds checked- equal and bilateral Secured at: 22 cm Tube secured with: Tape Dental Injury: Teeth and Oropharynx as per pre-operative assessment

## 2017-03-13 NOTE — Brief Op Note (Signed)
03/13/2017  4:07 PM  PATIENT:  [name]  56 y.o. male  PRE-OPERATIVE DIAGNOSIS:  Right knee prepatllar bursitis   POST-OPERATIVE DIAGNOSIS:  Right knee prepatllar bursitis   PROCEDURE:  Procedure(s): RIGHT KNEE PREPATELLAR BURSECTOMY (Right)  SURGEON:  Surgeon(s) and Role:    Gaynelle Arabian, MD - Primary  PHYSICIAN ASSISTANT:   ASSISTANTS: none   ANESTHESIA:   general  EBL:  Total I/O In: 1000 [I.V.:1000] Out: -   DRAINS: (Medium) Hemovact drain(s) in the right knee with  Suction Open   LOCAL MEDICATIONS USED:  NONE  COUNTS:  YES  TOURNIQUET:   Total Tourniquet Time Documented: Thigh (Right) - 18 minutes Total: Thigh (Right) - 18 minutes   DICTATION: .Other Dictation: Dictation Number [id number]  PLAN OF CARE: Discharge to home after PACU  PATIENT DISPOSITION:  PACU - hemodynamically stable.

## 2017-03-13 NOTE — Transfer of Care (Signed)
Immediate Anesthesia Transfer of Care Note  Patient: [name]  Procedure(s) Performed: Procedure(s): RIGHT KNEE PREPATELLAR BURSECTOMY (Right)  Patient Location: PACU  Anesthesia Type:General  Level of Consciousness:  sedated, patient cooperative and responds to stimulation  Airway & Oxygen Therapy:Patient Spontanous Breathing and Patient connected to face mask oxgen  Post-op Assessment:  Report given to PACU RN and Post -op Vital signs reviewed and stable  Post vital signs:  Reviewed and stable  Last Vitals:  Vitals:   03/13/17 1315  BP: 138/82  Pulse: 60  Resp: 18  Temp: 36.9 C    Complications: No apparent anesthesia complications

## 2017-03-13 NOTE — Discharge Instructions (Signed)
General Anesthesia, Adult, Care After These instructions provide you with information about caring for yourself after your procedure. Your health care provider may also give you more specific instructions. Your treatment has been planned according to current medical practices, but problems sometimes occur. Call your health care provider if you have any problems or questions after your procedure. What can I expect after the procedure? After the procedure, it is common to have:  Vomiting.  A sore throat.  Mental slowness. It is common to feel:  Nauseous.  Cold or shivery.  Sleepy.  Tired.  Sore or achy, even in parts of your body where you did not have surgery. Follow these instructions at home: For at least 24 hours after the procedure:   Do not:  Participate in activities where you could fall or become injured.  Drive.  Use heavy machinery.  Drink alcohol.  Take sleeping pills or medicines that cause drowsiness.  Make important decisions or sign legal documents.  Take care of children on your own.  Rest. Eating and drinking   If you vomit, drink water, juice, or soup when you can drink without vomiting.  Drink enough fluid to keep your urine clear or pale yellow.  Make sure you have little or no nausea before eating solid foods.  Follow the diet recommended by your health care provider. General instructions   Have a responsible adult stay with you until you are awake and alert.  Return to your normal activities as told by your health care provider. Ask your health care provider what activities are safe for you.  Take over-the-counter and prescription medicines only as told by your health care provider.  If you smoke, do not smoke without supervision.  Keep all follow-up visits as told by your health care provider. This is important. Contact a health care provider if:  You continue to have nausea or vomiting at home, and medicines are not helpful.  You  cannot drink fluids or start eating again.  You cannot urinate after 8-12 hours.  You develop a skin rash.  You have fever.  You have increasing redness at the site of your procedure. Get help right away if:  You have difficulty breathing.  You have chest pain.  You have unexpected bleeding.  You feel that you are having a life-threatening or urgent problem. This information is not intended to replace advice given to you by your health care provider. Make sure you discuss any questions you have with your health care provider. Document Released: 01/07/2001 Document Revised: 03/05/2016 Document Reviewed: 09/15/2015 Elsevier Interactive Patient Education  2017 [employer].    Keep your bandage on for 72 hours then you can remove it  Empty the drain as needed  You may start showering after you change the original bandage

## 2017-03-13 NOTE — Anesthesia Postprocedure Evaluation (Signed)
Anesthesia Post Note  Patient: [name]  Procedure(s) Performed: Procedure(s) (LRB): RIGHT KNEE PREPATELLAR BURSECTOMY (Right)  Patient location during evaluation: PACU Anesthesia Type: General Level of consciousness: awake and alert Pain management: pain level controlled Vital Signs Assessment: post-procedure vital signs reviewed and stable Respiratory status: spontaneous breathing, nonlabored ventilation, respiratory function stable and patient connected to nasal cannula oxygen Cardiovascular status: blood pressure returned to baseline and stable Postop Assessment: no signs of nausea or vomiting Anesthetic complications: no       Last Vitals:  Vitals:   03/13/17 1700 03/13/17 1711  BP: 112/70 133/83  Pulse:  (!) 54  Resp:  14  Temp: 36.6 C 36.5 C    Last Pain:  Vitals:   03/13/17 1720  TempSrc:   PainSc: 4                  Phillips Grout

## 2017-03-13 NOTE — Anesthesia Preprocedure Evaluation (Addendum)
Anesthesia Evaluation  Patient identified by MRN, date of birth, ID band Patient awake    Reviewed: Allergy & Precautions, NPO status , Patient's Chart, lab work & pertinent test results  History of Anesthesia Complications (+) PONV  Airway Mallampati: II  TM Distance: >3 FB Neck ROM: Full    Dental no notable dental hx.    Pulmonary asthma , former smoker,    Pulmonary exam normal breath sounds clear to auscultation       Cardiovascular hypertension, Normal cardiovascular exam Rhythm:Regular Rate:Normal     Neuro/Psych negative neurological ROS  negative psych ROS   GI/Hepatic negative GI ROS, Neg liver ROS,   Endo/Other  Morbid obesity  Renal/GU negative Renal ROS  negative genitourinary   Musculoskeletal negative musculoskeletal ROS (+)   Abdominal   Peds negative pediatric ROS (+)  Hematology negative hematology ROS (+)   Anesthesia Other Findings   Reproductive/Obstetrics negative OB ROS                             Anesthesia Physical Anesthesia Plan  ASA: III  Anesthesia Plan: General   Post-op Pain Management:    Induction: Intravenous  Airway Management Planned: LMA  Additional Equipment:   Intra-op Plan:   Post-operative Plan: Extubation in OR  Informed Consent: I have reviewed the patients History and Physical, chart, labs and discussed the procedure including the risks, benefits and alternatives for the proposed anesthesia with the patient or authorized representative who has indicated his/her understanding and acceptance.   Dental advisory given  Plan Discussed with: CRNA  Anesthesia Plan Comments:         Anesthesia Quick Evaluation

## 2017-03-13 NOTE — H&P (Signed)
CC- [name] is a 56 y.o. male who presents with right knee pain.  HPI- . Knee Pain: Patient presents with knee swelling involving the  right knee. Onset of the symptoms was several months ago.He has had a very complex history with his right knee including multiple revision surgeries for infection. He most recently had an irrigation and debridement with polyethylene exchange and the knee itself has done well but he has had persistent prepatellar bursal swelling requiring numerous aspirations. He presents now for bursal excision and drain placement.  Past Medical History:  Diagnosis Date  . Anemia    hx of  . Asthma   . Cellulitis of lower extremity    "usually RLL; this time it's got up to my lower abdoment" (02/11/2014)-series of antibiotics completed 02-28-14  . Diverticulosis   . Hypertension   . Infection of right knee ([place])   . OSA (obstructive sleep apnea)    "quit wearing mask several years ago" (02/11/2014)  . Pneumonia   . PONV (postoperative nausea and vomiting)   . Rheumatoid arthritis ([place]) dx'd ~ 1977  . S/P PICC central line placement 03-16-14   right upper arm remains intact.05-11-14 removed 1 week ago.    Past Surgical History:  Procedure Laterality Date  . EXCISIONAL HEMORRHOIDECTOMY    . EXCISIONAL TOTAL KNEE ARTHROPLASTY WITH ANTIBIOTIC SPACERS Right 03/18/2014   Procedure: RIGHT KNEE RESECTION ARTHROPLASTY WITH ANTIBIOTIC SPACERS;  Surgeon: [name], MD;  Location: [facility];  Service: Orthopedics;  Laterality: Right;  . FOREIGN BODY REMOVAL Left    "BB" removal above left eye-[age] yrs.  . I&D KNEE WITH POLY EXCHANGE Right 04/25/2016   Procedure: RIGHT KNEE IRRIGATION AND DEBRIDEMENT WITH POLY EXCHANGE;  Surgeon: Gaynelle Arabian, MD;  Location: [facility];  Service: Orthopedics;  Laterality: Right;  . I&D KNEE WITH POLY EXCHANGE Right 10/01/2016   Procedure: IRRIGATION AND DEBRIDEMENT KNEE WITH POLY EXCHANGE;  Surgeon: Gaynelle Arabian, MD;  Location: [facility];  Service:  Orthopedics;  Laterality: Right;  . INCISION AND DRAINAGE Right 09/28/2014   Procedure: INCISION AND DRAINAGE RIGHT KNEE;  Surgeon: [name], MD;  Location: [facility];  Service: Orthopedics;  Laterality: Right;  . picc line placement Right    right upper arm  . 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 REVISION TOTAL KNEE ARTHROPLASTY Right 2012  . SPIGELIAN HERNIA Right 12/04/2015   Procedure: INCARCERATED SPIGELIAN HERNIA REPAIR WITH MESH;  Surgeon: Georganna Skeans, MD;  Location: [place];  Service: General;  Laterality: Right;  . TOTAL KNEE ARTHROPLASTY Right 05/21/2014   Procedure: RIGHT KNEE ARTHROPLASTY REINPLANTATION;  Surgeon: [name], MD;  Location: [facility];  Service: Orthopedics;  Laterality: Right;    Prior to Admission medications   Medication Sig Start Date End Date Taking? Authorizing Provider  amoxicillin-clavulanate (AUGMENTIN) 875-125 MG tablet Take 1 tablet by mouth 2 (two) times daily. 02/12/17  Yes Comer, Okey Regal, MD  dextromethorphan-guaiFENesin [facility] DM) 30-600 MG 12hr tablet Take 1 tablet by mouth 2 (two) times daily.   Yes [provider]  diltiazem (CARDIZEM CD) 240 MG 24 hr capsule Take 240 mg by mouth daily.  11/12/10  Yes [provider]  fexofenadine (ALLEGRA) 180 MG tablet Take 180 mg by mouth every evening.   Yes [provider]  Fexofenadine-Pseudoephedrine (ALLEGRA-D PO) Take 1 tablet by mouth daily.  Yes [provider]  fluticasone furoate-vilanterol (BREO ELLIPTA) 200-25 MCG/INH AEPB Inhale 1 puff into the lungs daily.   Yes [provider]  folic acid (FOLVITE) 1 MG tablet Take 1 mg by mouth daily.   Yes [provider]  furosemide (LASIX) 40 MG tablet Take 40 mg by mouth daily.    Yes [provider]  Ipratropium-Albuterol (COMBIVENT RESPIMAT) 20-100 MCG/ACT  AERS respimat Inhale 2 puffs into the lungs every 4 (four) hours.   Yes [provider]  losartan (COZAAR) 100 MG tablet Take 100 mg by mouth daily.  11/12/10  Yes [provider]  methocarbamol (ROBAXIN) 500 MG tablet Take 1 tablet (500 mg total) by mouth every 6 (six) hours as needed for muscle spasms. Patient taking differently: Take 500 mg by mouth daily as needed for muscle spasms.  10/04/16  Yes Perkins, Alexzandrew L, PA-C  methotrexate (RHEUMATREX) 2.5 MG tablet Take 15 mg by mouth once a week. Caution:Chemotherapy. Protect from light. Takes 6 tablets weekly on Thursdays    Yes [provider]  metoprolol (TOPROL-XL) 50 MG 24 hr tablet Take 50 mg by mouth daily.  11/12/10  Yes [provider]  orlistat (ALLI) 60 MG capsule Take 60 mg by mouth daily.   Yes [provider]  Probiotic Product (PROBIOTIC PO) Take 1 tablet by mouth daily.   Yes [provider]  RABEprazole (ACIPHEX) 20 MG tablet Take 20 mg by mouth daily.   Yes [provider]  traMADol (ULTRAM) 50 MG tablet Take 1-2 tablets (50-100 mg total) by mouth every 6 (six) hours as needed (mild pain). Patient taking differently: Take 50 mg by mouth daily as needed (mild pain).  10/04/16  Yes Perkins, Alexzandrew L, PA-C  Fluticasone-Salmeterol (ADVAIR) 250-50 MCG/DOSE AEPB Inhale 1 puff into the lungs 2 (two) times daily as needed (wheezing).    [provider]   KNEE EXAM No effusion, collateral ligaments intact, large swollen prepatellar bursa  Physical Examination: General appearance - alert, well appearing, and in no distress Mental status - alert, oriented to person, place, and time Chest - clear to auscultation, no wheezes, rales or rhonchi, symmetric air entry Heart - normal rate, regular rhythm, normal S1, S2, no murmurs, rubs, clicks or gallops Abdomen - soft, nontender, nondistended, no masses or organomegaly Neurological - alert, oriented, normal  speech, no focal findings or movement disorder noted   Asessment/Plan--- Rightt knee recurrent prepatellar bursitis- - Plan right knee bursal excision. Procedure risks and potential comps discussed with patient who elects to proceed. Goals are decreased pain and increased function with a high likelihood of achieving both

## 2017-03-14 ENCOUNTER — Encounter (HOSPITAL_COMMUNITY): Payer: Self-pay | Admitting: Orthopedic Surgery

## 2017-03-14 NOTE — Op Note (Signed)
NAMEJACARIUS, [name]                 ACCOUNT NO.:  [id number]  MEDICAL RECORD NO.:  [id number]  LOCATION:                                 FACILITY:  PHYSICIAN:  Gaynelle Arabian, M.D.         DATE OF BIRTH:  DATE OF PROCEDURE:  03/13/2017 DATE OF DISCHARGE:                              OPERATIVE REPORT   PREOPERATIVE DIAGNOSIS:  Right knee recurrent prepatellar bursitis.  POSTOPERATIVE DIAGNOSIS:  Right knee recurrent prepatellar bursitis.  PROCEDURE:  Right knee prepatellar bursectomy.  SURGEON:  Gaynelle Arabian, M.D.  ASSISTANT:  None.  ANESTHESIA:  General.  ESTIMATED BLOOD LOSS:  Minimal.  DRAINS:  Hemovac x1.  TOURNIQUET TIME:  18 minutes at 300 mmHg.  COMPLICATIONS:  None.  CONDITION:  Stable to recovery.  BRIEF CLINICAL NOTE:  [name] is a 56 year old male, very long complex history in regard to his right knee.  He has had multiple revision procedures, most recent being an irrigation, debridement, and polyethylene exchange for an acute infection.  He has been infection free, but he has had recurrent prepatellar bursitis.  He has had multiple aspirations.  Given that it keeps coming back, we decided to proceed with bursectomy and closure over drains.  He presents for that now.  PROCEDURE IN DETAIL:  After successful administration of general anesthetic, a tourniquet was placed high on his right thigh and his right lower extremity prepped and draped in the usual sterile fashion. Extremities wrapped in Esmarch, tourniquet inflated to 300 mmHg. Previous midline incisions were utilized.  Skin cut with a 10 blade through the subcutaneous tissue.  Large pocket of fluid was found in the prepatellar space.  There was a thin smooth tissue consistent with bursal tissue surrounding the entire space and I removed that tissue back to normal-appearing tissue.  The wound was then copiously irrigated with saline solution.  I have removed about an 8 mm strip of skin centrally along  the incision because it was so redundant.  I then closed the subcu with interrupted 2-0 Vicryl and skin with staples.  Incision was cleaned and dried.  The drain was sewn in with interrupted 2-0 nylon suture.  It was hooked to the Hemovac. Tourniquet was released with total time of 18 minutes.  A bulky sterile dressing was then applied.  He was awakened and transported to recovery in stable condition.     Gaynelle Arabian, M.D.     FA/MEDQ  D:  03/13/2017  T:  03/14/2017  Job:  [id number]

## 2017-04-15 ENCOUNTER — Ambulatory Visit (INDEPENDENT_AMBULATORY_CARE_PROVIDER_SITE_OTHER): Payer: 59 | Admitting: Internal Medicine

## 2017-04-15 ENCOUNTER — Encounter: Payer: Self-pay | Admitting: Internal Medicine

## 2017-04-15 DIAGNOSIS — T8453XD Infection and inflammatory reaction due to internal right knee prosthesis, subsequent encounter: Secondary | ICD-10-CM | POA: Diagnosis not present

## 2017-04-15 DIAGNOSIS — Z5181 Encounter for therapeutic drug level monitoring: Secondary | ICD-10-CM | POA: Diagnosis not present

## 2017-04-15 NOTE — Progress Notes (Signed)
   Subjective:    Patient ID: [name], male    DOB: Sep 01, 1961, 56 y.o.   MRN: [id number]  HPI Here for hsfu.  [name] is a 56 y.o. 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  He again developed PJI with Staph lugdunensis earlier this year and treated with daptomycin after developing a vancomycin allergy. He completed 6 weeks and stopped after that.  With allergy history no oral continuation therapy was done.  Previously had resection arthroplasty and on vancomycin then.  He came back in December 2017 with knee pain, swelling c/w PJI again. No culture grew and treated for 6 more weeks with daptomycin.   Has a history of pcn allergy and sulfa allergy with rash, itching but pcn was 35 years ago.  Had a recent surgery for bursitis with bursectomy.  Also had his methotrexate increased.   I started him on Augmentin and he has done well with this, no rash, no diarrhea.  No associated n/v.  No fever, no chills.  No new issues.     Review of Systems  Gastrointestinal: Negative for diarrhea and nausea.  Skin: Negative for rash.  Neurological: Negative for dizziness.       Objective:   Physical Exam  Constitutional: He appears well-developed and well-nourished. No distress.  Eyes: No scleral icterus.  Cardiovascular: Normal rate, regular rhythm and normal heart sounds.   No murmur heard. Pulmonary/Chest: Effort normal and breath sounds normal. No respiratory distress.  Some congestion with allergies  Skin: No rash noted.   SH: no tobacco       Assessment & Plan:

## 2017-04-15 NOTE — Assessment & Plan Note (Signed)
Knee seems to be doing well, though difficult with on going RA.  At this time, I will have him complete his current Augmentin prescription and then stop and observe off of antibiotics and hope he is cured.  He continues to see Dr. Despina Hick so will not have him follow up unless concerns develop.  rtc prn

## 2017-04-15 NOTE — Assessment & Plan Note (Deleted)
Recent increase of MTX.  Certainly at risk of infections but hopefully will do well now.

## 2017-04-15 NOTE — Assessment & Plan Note (Signed)
Tolerating well.  No indication for labs at this time.

## 2017-08-22 ENCOUNTER — Ambulatory Visit: Payer: Self-pay | Admitting: Orthopedic Surgery

## 2017-08-22 NOTE — Patient Instructions (Addendum)
[name]  08/22/2017   Your procedure is scheduled on: 08-26-17   Report to [facility] Main  Entrance Report to admitting at 12:20 PM   Call this number if you have problems the morning of surgery [phone]   Remember: ONLY 1 PERSON MAY GO WITH YOU TO SHORT STAY TO GET  READY MORNING OF YOUR SURGERY.  Do not eat food or drink liquids :After Midnight. You may have a Clear Liquid Diet from Midnight until 8:50 AM. After 8:50 AM, nothing until after surgery   CLEAR LIQUID DIET   Foods Allowed                                                                     Foods Excluded  Coffee and tea, regular and decaf                             liquids that you cannot  Plain Jell-O in any flavor                                             see through such as: Fruit ices (not with fruit pulp)                                     milk, soups, orange juice  Iced Popsicles                                    All solid food Carbonated beverages, regular and diet                                    Cranberry, grape and apple juices Sports drinks like Gatorade Lightly seasoned clear broth or consume(fat free) Sugar, honey syrup  Sample Menu Breakfast                                Lunch                                     Supper Cranberry juice                    Beef broth                            Chicken broth Jell-O                                     Grape juice  Apple juice Coffee or tea                        Jell-O                                      Popsicle                                                Coffee or tea                        Coffee or tea  _____________________________________________________________________     Take these medicines the morning of surgery with A SIP OF WATER: Diltiazem (Cardizem), Metoprolol (Toprol), and Rabeprazole (Aciphex). You may also bring and use your inhaler as needed.                                 You may not have any metal on your body including hair pins and              piercings  Do not wear jewelry, lotions, powders,  deodorant             Men may shave face and neck.   Do not bring valuables to the hospital. [facility] IS NOT             RESPONSIBLE   FOR VALUABLES.  Contacts, dentures or bridgework may not be worn into surgery.  Leave suitcase in the car. After surgery it may be brought to your room.               Please read over the following fact sheets you were given: _____________________________________________________________________             [facility] - Preparing for Surgery Before surgery, you can play an important role.  Because skin is not sterile, your skin needs to be as free of germs as possible.  You can reduce the number of germs on your skin by washing with CHG (chlorahexidine gluconate) soap before surgery.  CHG is an antiseptic cleaner which kills germs and bonds with the skin to continue killing germs even after washing. Please DO NOT use if you have an allergy to CHG or antibacterial soaps.  If your skin becomes reddened/irritated stop using the CHG and inform your nurse when you arrive at Short Stay. Do not shave (including legs and underarms) for at least 48 hours prior to the first CHG shower.  You may shave your face/neck. Please follow these instructions carefully:  1.  Shower with CHG Soap the night before surgery and the  morning of Surgery.  2.  If you choose to wash your hair, wash your hair first as usual with your  normal  shampoo.  3.  After you shampoo, rinse your hair and body thoroughly to remove the  shampoo.                           4.  Use CHG as you would any other liquid soap.  You can apply chg directly  to the skin and wash  Gently with a scrungie or clean washcloth.  5.  Apply the CHG Soap to your body ONLY FROM THE NECK DOWN.   Do not use on face/ open                           Wound or open  sores. Avoid contact with eyes, ears mouth and genitals (private parts).                       Wash face,  Genitals (private parts) with your normal soap.             6.  Wash thoroughly, paying special attention to the area where your surgery  will be performed.  7.  Thoroughly rinse your body with warm water from the neck down.  8.  DO NOT shower/wash with your normal soap after using and rinsing off  the CHG Soap.                9.  Pat yourself dry with a clean towel.            10.  Wear clean pajamas.            11.  Place clean sheets on your bed the night of your first shower and do not  sleep with pets. Day of Surgery : Do not apply any lotions/deodorants the morning of surgery.  Please wear clean clothes to the hospital/surgery center.  FAILURE TO FOLLOW THESE INSTRUCTIONS MAY RESULT IN THE CANCELLATION OF YOUR SURGERY PATIENT SIGNATURE_________________________________  NURSE SIGNATURE__________________________________  ________________________________________________________________________   [name]  An incentive spirometer is a tool that can help keep your lungs clear and active. This tool measures how well you are filling your lungs with each breath. Taking long deep breaths may help reverse or decrease the chance of developing breathing (pulmonary) problems (especially infection) following:  A long period of time when you are unable to move or be active. BEFORE THE PROCEDURE   If the spirometer includes an indicator to show your best effort, your nurse or respiratory therapist will set it to a desired goal.  If possible, sit up straight or lean slightly forward. Try not to slouch.  Hold the incentive spirometer in an upright position. INSTRUCTIONS FOR USE  1. Sit on the edge of your bed if possible, or sit up as far as you can in bed or on a chair. 2. Hold the incentive spirometer in an upright position. 3. Breathe out normally. 4. Place the mouthpiece  in your mouth and seal your lips tightly around it. 5. Breathe in slowly and as deeply as possible, raising the piston or the ball toward the top of the column. 6. Hold your breath for 3-5 seconds or for as long as possible. Allow the piston or ball to fall to the bottom of the column. 7. Remove the mouthpiece from your mouth and breathe out normally. 8. Rest for a few seconds and repeat Steps 1 through 7 at least 10 times every 1-2 hours when you are awake. Take your time and take a few normal breaths between deep breaths. 9. The spirometer may include an indicator to show your best effort. Use the indicator as a goal to work toward during each repetition. 10. After each set of 10 deep breaths, practice coughing to be sure your lungs are clear. If you have an incision (the cut made at the time of  surgery), support your incision when coughing by placing a pillow or rolled up towels firmly against it. Once you are able to get out of bed, walk around indoors and cough well. You may stop using the incentive spirometer when instructed by your caregiver.  RISKS AND COMPLICATIONS  Take your time so you do not get dizzy or light-headed.  If you are in pain, you may need to take or ask for pain medication before doing incentive spirometry. It is harder to take a deep breath if you are having pain. AFTER USE  Rest and breathe slowly and easily.  It can be helpful to keep track of a log of your progress. Your caregiver can provide you with a simple table to help with this. If you are using the spirometer at home, follow these instructions: [place] IF:   You are having difficultly using the spirometer.  You have trouble using the spirometer as often as instructed.  Your pain medication is not giving enough relief while using the spirometer.  You develop fever of 100.5 F (38.1 C) or higher. SEEK IMMEDIATE MEDICAL CARE IF:   You cough up bloody sputum that had not been present  before.  You develop fever of 102 F (38.9 C) or greater.  You develop worsening pain at or near the incision site. MAKE SURE YOU:   Understand these instructions.  Will watch your condition.  Will get help right away if you are not doing well or get worse. Document Released: 02/11/2007 Document Revised: 12/24/2011 Document Reviewed: 04/14/2007 Exit[employer] Patient Information 2014 [employer], [place].   ________________________________________________________________________  WHAT IS A BLOOD TRANSFUSION? Blood Transfusion Information  A transfusion is the replacement of blood or some of its parts. Blood is made up of multiple cells which provide different functions.  Red blood cells carry oxygen and are used for blood loss replacement.  White blood cells fight against infection.  Platelets control bleeding.  Plasma helps clot blood.  Other blood products are available for specialized needs, such as hemophilia or other clotting disorders. BEFORE THE TRANSFUSION  Who gives blood for transfusions?   Healthy volunteers who are fully evaluated to make sure their blood is safe. This is blood bank blood. Transfusion therapy is the safest it has ever been in the practice of medicine. Before blood is taken from a donor, a complete history is taken to make sure that person has no history of diseases nor engages in risky social behavior (examples are intravenous drug use or sexual activity with multiple partners). The donor's travel history is screened to minimize risk of transmitting infections, such as malaria. The donated blood is tested for signs of infectious diseases, such as HIV and hepatitis. The blood is then tested to be sure it is compatible with you in order to minimize the chance of a transfusion reaction. If you or a relative donates blood, this is often done in anticipation of surgery and is not appropriate for emergency situations. It takes many days to process the donated  blood. RISKS AND COMPLICATIONS Although transfusion therapy is very safe and saves many lives, the main dangers of transfusion include:   Getting an infectious disease.  Developing a transfusion reaction. This is an allergic reaction to something in the blood you were given. Every precaution is taken to prevent this. The decision to have a blood transfusion has been considered carefully by your caregiver before blood is given. Blood is not given unless the benefits outweigh the risks. AFTER THE  TRANSFUSION  Right after receiving a blood transfusion, you will usually feel much better and more energetic. This is especially true if your red blood cells have gotten low (anemic). The transfusion raises the level of the red blood cells which carry oxygen, and this usually causes an energy increase.  The nurse administering the transfusion will monitor you carefully for complications. HOME CARE INSTRUCTIONS  No special instructions are needed after a transfusion. You may find your energy is better. Speak with your caregiver about any limitations on activity for underlying diseases you may have. SEEK MEDICAL CARE IF:   Your condition is not improving after your transfusion.  You develop redness or irritation at the intravenous (IV) site. SEEK IMMEDIATE MEDICAL CARE IF:  Any of the following symptoms occur over the next 12 hours:  Shaking chills.  You have a temperature by mouth above 102 F (38.9 C), not controlled by medicine.  Chest, back, or muscle pain.  People around you feel you are not acting correctly or are confused.  Shortness of breath or difficulty breathing.  Dizziness and fainting.  You get a rash or develop hives.  You have a decrease in urine output.  Your urine turns a dark color or changes to pink, red, or brown. Any of the following symptoms occur over the next 10 days:  You have a temperature by mouth above 102 F (38.9 C), not controlled by  medicine.  Shortness of breath.  Weakness after normal activity.  The white part of the eye turns yellow (jaundice).  You have a decrease in the amount of urine or are urinating less often.  Your urine turns a dark color or changes to pink, red, or brown. Document Released: 09/28/2000 Document Revised: 12/24/2011 Document Reviewed: 05/17/2008 [facility] Patient Information 2014 [place], [place].  _______________________________________________________________________

## 2017-08-23 ENCOUNTER — Encounter (HOSPITAL_COMMUNITY): Payer: Self-pay

## 2017-08-23 ENCOUNTER — Encounter (HOSPITAL_COMMUNITY)
Admission: RE | Admit: 2017-08-23 | Discharge: 2017-08-23 | Disposition: A | Discharge status: Home / Self Care | Payer: 59 | Source: Ambulatory Visit | Attending: Orthopedic Surgery | Admitting: Orthopedic Surgery

## 2017-08-23 ENCOUNTER — Other Ambulatory Visit: Payer: Self-pay

## 2017-08-23 DIAGNOSIS — Z0183 Encounter for blood typing: Secondary | ICD-10-CM | POA: Diagnosis not present

## 2017-08-23 DIAGNOSIS — I10 Essential (primary) hypertension: Secondary | ICD-10-CM | POA: Diagnosis not present

## 2017-08-23 DIAGNOSIS — I491 Atrial premature depolarization: Secondary | ICD-10-CM | POA: Diagnosis not present

## 2017-08-23 DIAGNOSIS — Z01812 Encounter for preprocedural laboratory examination: Secondary | ICD-10-CM | POA: Insufficient documentation

## 2017-08-23 DIAGNOSIS — Z01818 Encounter for other preprocedural examination: Secondary | ICD-10-CM | POA: Insufficient documentation

## 2017-08-23 DIAGNOSIS — M25461 Effusion, right knee: Secondary | ICD-10-CM | POA: Insufficient documentation

## 2017-08-23 LAB — CBC
HEMATOCRIT: 38.4 % — AB (ref 39.0–52.0)
HEMOGLOBIN: 11.9 g/dL — AB (ref 13.0–17.0)
MCH: 25.5 pg — AB (ref 26.0–34.0)
MCHC: 31 g/dL (ref 30.0–36.0)
MCV: 82.4 fL (ref 78.0–100.0)
Platelets: 317 10*3/uL (ref 150–400)
RBC: 4.66 MIL/uL (ref 4.22–5.81)
RDW: 16.4 % — ABNORMAL HIGH (ref 11.5–15.5)
WBC: 6.8 10*3/uL (ref 4.0–10.5)

## 2017-08-23 LAB — APTT: aPTT: 29 seconds (ref 24–36)

## 2017-08-23 LAB — COMPREHENSIVE METABOLIC PANEL
ALK PHOS: 62 U/L (ref 38–126)
ALT: 12 U/L — AB (ref 17–63)
ANION GAP: 6 (ref 5–15)
AST: 18 U/L (ref 15–41)
Albumin: 3.3 g/dL — ABNORMAL LOW (ref 3.5–5.0)
BUN: 16 mg/dL (ref 6–20)
CALCIUM: 8.6 mg/dL — AB (ref 8.9–10.3)
CHLORIDE: 109 mmol/L (ref 101–111)
CO2: 25 mmol/L (ref 22–32)
CREATININE: 0.81 mg/dL (ref 0.61–1.24)
Glucose, Bld: 90 mg/dL (ref 65–99)
Potassium: 4.5 mmol/L (ref 3.5–5.1)
Sodium: 140 mmol/L (ref 135–145)
Total Bilirubin: 0.5 mg/dL (ref 0.3–1.2)
Total Protein: 6.8 g/dL (ref 6.5–8.1)

## 2017-08-23 LAB — PROTIME-INR
INR: 0.99
PROTHROMBIN TIME: 13 s (ref 11.4–15.2)

## 2017-08-23 LAB — SURGICAL PCR SCREEN
MRSA, PCR: NEGATIVE
Staphylococcus aureus: NEGATIVE

## 2017-08-26 ENCOUNTER — Encounter (HOSPITAL_COMMUNITY)
Admission: RE | Disposition: A | Discharge status: Home / Self Care | Payer: Self-pay | Source: Ambulatory Visit | Attending: Orthopedic Surgery

## 2017-08-26 ENCOUNTER — Ambulatory Visit (HOSPITAL_COMMUNITY): Payer: 59 | Admitting: Anesthesiology

## 2017-08-26 ENCOUNTER — Ambulatory Visit (HOSPITAL_COMMUNITY)
Admission: RE | Admit: 2017-08-26 | Discharge: 2017-08-27 | Disposition: A | Discharge status: Home / Self Care | Payer: 59 | Source: Ambulatory Visit | Attending: Orthopedic Surgery | Admitting: Orthopedic Surgery

## 2017-08-26 ENCOUNTER — Other Ambulatory Visit: Payer: Self-pay

## 2017-08-26 ENCOUNTER — Encounter (HOSPITAL_COMMUNITY): Payer: Self-pay

## 2017-08-26 DIAGNOSIS — G4733 Obstructive sleep apnea (adult) (pediatric): Secondary | ICD-10-CM | POA: Diagnosis not present

## 2017-08-26 DIAGNOSIS — Z96653 Presence of artificial knee joint, bilateral: Secondary | ICD-10-CM | POA: Diagnosis not present

## 2017-08-26 DIAGNOSIS — Z96643 Presence of artificial hip joint, bilateral: Secondary | ICD-10-CM | POA: Diagnosis not present

## 2017-08-26 DIAGNOSIS — Z881 Allergy status to other antibiotic agents status: Secondary | ICD-10-CM | POA: Diagnosis not present

## 2017-08-26 DIAGNOSIS — I10 Essential (primary) hypertension: Secondary | ICD-10-CM | POA: Insufficient documentation

## 2017-08-26 DIAGNOSIS — M009 Pyogenic arthritis, unspecified: Secondary | ICD-10-CM | POA: Diagnosis present

## 2017-08-26 DIAGNOSIS — Z79899 Other long term (current) drug therapy: Secondary | ICD-10-CM | POA: Insufficient documentation

## 2017-08-26 DIAGNOSIS — L03115 Cellulitis of right lower limb: Secondary | ICD-10-CM

## 2017-08-26 DIAGNOSIS — M1990 Unspecified osteoarthritis, unspecified site: Secondary | ICD-10-CM | POA: Insufficient documentation

## 2017-08-26 DIAGNOSIS — J45909 Unspecified asthma, uncomplicated: Secondary | ICD-10-CM | POA: Diagnosis not present

## 2017-08-26 DIAGNOSIS — Z888 Allergy status to other drugs, medicaments and biological substances status: Secondary | ICD-10-CM | POA: Diagnosis not present

## 2017-08-26 DIAGNOSIS — Z882 Allergy status to sulfonamides status: Secondary | ICD-10-CM | POA: Insufficient documentation

## 2017-08-26 DIAGNOSIS — Z87891 Personal history of nicotine dependence: Secondary | ICD-10-CM | POA: Diagnosis not present

## 2017-08-26 DIAGNOSIS — M069 Rheumatoid arthritis, unspecified: Secondary | ICD-10-CM | POA: Diagnosis not present

## 2017-08-26 DIAGNOSIS — M7041 Prepatellar bursitis, right knee: Secondary | ICD-10-CM | POA: Diagnosis not present

## 2017-08-26 DIAGNOSIS — M25461 Effusion, right knee: Secondary | ICD-10-CM | POA: Diagnosis not present

## 2017-08-26 DIAGNOSIS — T8453XA Infection and inflammatory reaction due to internal right knee prosthesis, initial encounter: Secondary | ICD-10-CM

## 2017-08-26 DIAGNOSIS — Z6841 Body Mass Index (BMI) 40.0 and over, adult: Secondary | ICD-10-CM | POA: Insufficient documentation

## 2017-08-26 HISTORY — PX: I & D KNEE WITH POLY EXCHANGE: SHX5024

## 2017-08-26 LAB — TYPE AND SCREEN
ABO/RH(D): O POS
Antibody Screen: NEGATIVE

## 2017-08-26 SURGERY — IRRIGATION AND DEBRIDEMENT KNEE WITH POLY EXCHANGE
Anesthesia: General | Site: Knee | Laterality: Right

## 2017-08-26 MED ORDER — ACETAMINOPHEN 10 MG/ML IV SOLN: 1000.0000 mg | Freq: Once | INTRAVENOUS | Status: DC | PRN

## 2017-08-26 MED ORDER — PHENYLEPHRINE 40 MCG/ML (10ML) SYRINGE FOR IV PUSH (FOR BLOOD PRESSURE SUPPORT)
PREFILLED_SYRINGE | INTRAVENOUS | Status: DC | PRN
  Administered 2017-08-26 (×4): 80 ug via INTRAVENOUS
  Administered 2017-08-26 (×2): 120 ug via INTRAVENOUS
  Administered 2017-08-26: 80 ug via INTRAVENOUS

## 2017-08-26 MED ORDER — MEPERIDINE HCL 50 MG/ML IJ SOLN: 6.2500 mg | INTRAMUSCULAR | Status: DC | PRN

## 2017-08-26 MED ORDER — DIPHENHYDRAMINE HCL 12.5 MG/5ML PO ELIX: 12.5000 mg | ORAL_SOLUTION | ORAL | Status: DC | PRN

## 2017-08-26 MED ORDER — DEXAMETHASONE SODIUM PHOSPHATE 10 MG/ML IJ SOLN
10.0000 mg | Freq: Once | INTRAMUSCULAR | Status: AC
  Administered 2017-08-27: 11:00:00 10 mg via INTRAVENOUS
  Filled 2017-08-26: qty 1

## 2017-08-26 MED ORDER — IPRATROPIUM-ALBUTEROL 0.5-2.5 (3) MG/3ML IN SOLN: 3.0000 mL | RESPIRATORY_TRACT | Status: DC | PRN

## 2017-08-26 MED ORDER — MIDAZOLAM HCL 2 MG/2ML IJ SOLN
INTRAMUSCULAR | Status: AC
  Filled 2017-08-26: qty 2

## 2017-08-26 MED ORDER — ONDANSETRON HCL 4 MG PO TABS
4.0000 mg | ORAL_TABLET | Freq: Four times a day (QID) | ORAL | Status: DC | PRN
  Administered 2017-08-26: 4 mg via ORAL
  Filled 2017-08-26: qty 1

## 2017-08-26 MED ORDER — FENTANYL CITRATE (PF) 100 MCG/2ML IJ SOLN
25.0000 ug | INTRAMUSCULAR | Status: DC | PRN
  Administered 2017-08-26: 25 ug via INTRAVENOUS
  Administered 2017-08-26: 50 ug via INTRAVENOUS
  Administered 2017-08-26: 25 ug via INTRAVENOUS

## 2017-08-26 MED ORDER — MORPHINE SULFATE (PF) 4 MG/ML IV SOLN
1.0000 mg | INTRAVENOUS | Status: DC | PRN
  Filled 2017-08-26: qty 1

## 2017-08-26 MED ORDER — METOPROLOL SUCCINATE ER 50 MG PO TB24
50.0000 mg | ORAL_TABLET | Freq: Every day | ORAL | Status: DC
  Administered 2017-08-27: 50 mg via ORAL
  Filled 2017-08-26 (×2): qty 1

## 2017-08-26 MED ORDER — DEXTROSE 5 % IV SOLN
500.0000 mg | Freq: Four times a day (QID) | INTRAVENOUS | Status: DC | PRN
  Filled 2017-08-26: qty 5

## 2017-08-26 MED ORDER — DEXAMETHASONE SODIUM PHOSPHATE 10 MG/ML IJ SOLN: 10.0000 mg | Freq: Once | INTRAMUSCULAR | Status: DC

## 2017-08-26 MED ORDER — LIDOCAINE 2% (20 MG/ML) 5 ML SYRINGE
INTRAMUSCULAR | Status: AC
  Filled 2017-08-26: qty 5

## 2017-08-26 MED ORDER — LOSARTAN POTASSIUM 50 MG PO TABS
100.0000 mg | ORAL_TABLET | Freq: Every day | ORAL | Status: DC
  Administered 2017-08-27: 11:00:00 100 mg via ORAL
  Filled 2017-08-26: qty 2

## 2017-08-26 MED ORDER — ROCURONIUM BROMIDE 10 MG/ML (PF) SYRINGE
PREFILLED_SYRINGE | INTRAVENOUS | Status: DC | PRN
  Administered 2017-08-26: 50 mg via INTRAVENOUS

## 2017-08-26 MED ORDER — OXYCODONE HCL 5 MG PO TABS
5.0000 mg | ORAL_TABLET | ORAL | Status: DC | PRN
  Administered 2017-08-27: 5 mg via ORAL

## 2017-08-26 MED ORDER — CHLORHEXIDINE GLUCONATE 4 % EX LIQD: 60.0000 mL | Freq: Once | CUTANEOUS | Status: DC

## 2017-08-26 MED ORDER — FLEET ENEMA 7-19 GM/118ML RE ENEM: 1.0000 | ENEMA | Freq: Once | RECTAL | Status: DC | PRN

## 2017-08-26 MED ORDER — PROMETHAZINE HCL 25 MG/ML IJ SOLN
6.2500 mg | INTRAMUSCULAR | Status: DC | PRN
Start: 2017-08-26 — End: 2017-08-26

## 2017-08-26 MED ORDER — SUGAMMADEX SODIUM 200 MG/2ML IV SOLN
INTRAVENOUS | Status: DC | PRN
  Administered 2017-08-26: 300 mg via INTRAVENOUS

## 2017-08-26 MED ORDER — HYDROMORPHONE HCL 1 MG/ML IJ SOLN
INTRAMUSCULAR | Status: DC | PRN
  Administered 2017-08-26: 0.5 mg via INTRAVENOUS

## 2017-08-26 MED ORDER — FUROSEMIDE 40 MG PO TABS
40.0000 mg | ORAL_TABLET | Freq: Every day | ORAL | Status: DC
  Administered 2017-08-27: 40 mg via ORAL
  Filled 2017-08-26: qty 1

## 2017-08-26 MED ORDER — METHOCARBAMOL 500 MG PO TABS
500.0000 mg | ORAL_TABLET | Freq: Four times a day (QID) | ORAL | Status: DC | PRN
  Administered 2017-08-26 – 2017-08-27 (×2): 500 mg via ORAL
  Filled 2017-08-26 (×2): qty 1

## 2017-08-26 MED ORDER — PHENYLEPHRINE 40 MCG/ML (10ML) SYRINGE FOR IV PUSH (FOR BLOOD PRESSURE SUPPORT)
PREFILLED_SYRINGE | INTRAVENOUS | Status: AC
  Filled 2017-08-26: qty 10

## 2017-08-26 MED ORDER — CLINDAMYCIN PHOSPHATE 900 MG/50ML IV SOLN
900.0000 mg | INTRAVENOUS | Status: AC
  Administered 2017-08-26: 900 mg via INTRAVENOUS

## 2017-08-26 MED ORDER — SODIUM CHLORIDE 0.9 % IR SOLN
Status: DC | PRN
  Administered 2017-08-26: 6000 mL

## 2017-08-26 MED ORDER — FENTANYL CITRATE (PF) 250 MCG/5ML IJ SOLN
INTRAMUSCULAR | Status: AC
  Filled 2017-08-26: qty 5

## 2017-08-26 MED ORDER — CLINDAMYCIN PHOSPHATE 600 MG/50ML IV SOLN
600.0000 mg | Freq: Four times a day (QID) | INTRAVENOUS | Status: AC
  Administered 2017-08-26 – 2017-08-27 (×2): 600 mg via INTRAVENOUS
  Filled 2017-08-26 (×2): qty 50

## 2017-08-26 MED ORDER — DILTIAZEM HCL ER COATED BEADS 240 MG PO CP24
240.0000 mg | ORAL_CAPSULE | Freq: Every day | ORAL | Status: DC
  Administered 2017-08-27: 11:00:00 240 mg via ORAL
  Filled 2017-08-26: qty 1

## 2017-08-26 MED ORDER — MOMETASONE FURO-FORMOTEROL FUM 200-5 MCG/ACT IN AERO
2.0000 | INHALATION_SPRAY | Freq: Two times a day (BID) | RESPIRATORY_TRACT | Status: DC | PRN
  Filled 2017-08-26: qty 8.8

## 2017-08-26 MED ORDER — PHENOL 1.4 % MT LIQD
1.0000 | OROMUCOSAL | Status: DC | PRN
  Filled 2017-08-26: qty 177

## 2017-08-26 MED ORDER — ACETAMINOPHEN 325 MG PO TABS: 650.0000 mg | ORAL_TABLET | ORAL | Status: DC | PRN

## 2017-08-26 MED ORDER — DEXAMETHASONE SODIUM PHOSPHATE 10 MG/ML IJ SOLN
INTRAMUSCULAR | Status: AC
  Filled 2017-08-26: qty 1

## 2017-08-26 MED ORDER — KETAMINE HCL-SODIUM CHLORIDE 100-0.9 MG/10ML-% IV SOSY
PREFILLED_SYRINGE | INTRAVENOUS | Status: AC
  Filled 2017-08-26: qty 10

## 2017-08-26 MED ORDER — CLINDAMYCIN PHOSPHATE 900 MG/50ML IV SOLN
INTRAVENOUS | Status: AC
  Filled 2017-08-26: qty 50

## 2017-08-26 MED ORDER — ASPIRIN EC 325 MG PO TBEC
325.0000 mg | DELAYED_RELEASE_TABLET | Freq: Every day | ORAL | Status: DC
  Administered 2017-08-27: 11:00:00 325 mg via ORAL
  Filled 2017-08-26: qty 1

## 2017-08-26 MED ORDER — HYDROMORPHONE HCL 2 MG/ML IJ SOLN
INTRAMUSCULAR | Status: AC
  Filled 2017-08-26: qty 1

## 2017-08-26 MED ORDER — DEXAMETHASONE SODIUM PHOSPHATE 10 MG/ML IJ SOLN
INTRAMUSCULAR | Status: DC | PRN
  Administered 2017-08-26: 10 mg via INTRAVENOUS

## 2017-08-26 MED ORDER — ONDANSETRON HCL 4 MG/2ML IJ SOLN
INTRAMUSCULAR | Status: AC
  Filled 2017-08-26: qty 2

## 2017-08-26 MED ORDER — SODIUM CHLORIDE 0.9 % IJ SOLN
INTRAMUSCULAR | Status: AC
  Filled 2017-08-26: qty 10

## 2017-08-26 MED ORDER — ACETAMINOPHEN 500 MG PO TABS
1000.0000 mg | ORAL_TABLET | Freq: Four times a day (QID) | ORAL | Status: DC
  Administered 2017-08-26 – 2017-08-27 (×2): 1000 mg via ORAL
  Filled 2017-08-26 (×2): qty 2

## 2017-08-26 MED ORDER — SODIUM CHLORIDE 0.9 % IV SOLN
INTRAVENOUS | Status: DC
Start: 2017-08-26 — End: 2017-08-27
  Administered 2017-08-26: 18:00:00 via INTRAVENOUS

## 2017-08-26 MED ORDER — MIDAZOLAM HCL 2 MG/2ML IJ SOLN
INTRAMUSCULAR | Status: DC | PRN
  Administered 2017-08-26: 2 mg via INTRAVENOUS

## 2017-08-26 MED ORDER — ONDANSETRON HCL 4 MG/2ML IJ SOLN: 4.0000 mg | Freq: Four times a day (QID) | INTRAMUSCULAR | Status: DC | PRN

## 2017-08-26 MED ORDER — ONDANSETRON HCL 4 MG/2ML IJ SOLN
INTRAMUSCULAR | Status: DC | PRN
  Administered 2017-08-26: 4 mg via INTRAVENOUS

## 2017-08-26 MED ORDER — SUCCINYLCHOLINE CHLORIDE 200 MG/10ML IV SOSY
PREFILLED_SYRINGE | INTRAVENOUS | Status: AC
  Filled 2017-08-26: qty 10

## 2017-08-26 MED ORDER — KETAMINE HCL 10 MG/ML IJ SOLN
INTRAMUSCULAR | Status: DC | PRN
  Administered 2017-08-26: 10 mg via INTRAVENOUS
  Administered 2017-08-26: 30 mg via INTRAVENOUS

## 2017-08-26 MED ORDER — LACTATED RINGERS IV SOLN
INTRAVENOUS | Status: DC
  Administered 2017-08-26: 16:00:00 via INTRAVENOUS
  Administered 2017-08-26: 1000 mL via INTRAVENOUS

## 2017-08-26 MED ORDER — LIDOCAINE 2% (20 MG/ML) 5 ML SYRINGE
INTRAMUSCULAR | Status: DC | PRN
  Administered 2017-08-26: 100 mg via INTRAVENOUS

## 2017-08-26 MED ORDER — SUGAMMADEX SODIUM 500 MG/5ML IV SOLN
INTRAVENOUS | Status: AC
  Filled 2017-08-26: qty 5

## 2017-08-26 MED ORDER — MENTHOL 3 MG MT LOZG
1.0000 | LOZENGE | OROMUCOSAL | Status: DC | PRN
  Filled 2017-08-26: qty 9

## 2017-08-26 MED ORDER — OXYCODONE HCL 5 MG PO TABS
10.0000 mg | ORAL_TABLET | ORAL | Status: DC | PRN
  Administered 2017-08-26 – 2017-08-27 (×3): 10 mg via ORAL
  Filled 2017-08-26 (×4): qty 2

## 2017-08-26 MED ORDER — PANTOPRAZOLE SODIUM 40 MG PO TBEC
40.0000 mg | DELAYED_RELEASE_TABLET | Freq: Every day | ORAL | Status: DC
  Administered 2017-08-27: 40 mg via ORAL
  Filled 2017-08-26: qty 1

## 2017-08-26 MED ORDER — DOCUSATE SODIUM 100 MG PO CAPS
100.0000 mg | ORAL_CAPSULE | Freq: Two times a day (BID) | ORAL | Status: DC
  Administered 2017-08-26 – 2017-08-27 (×2): 100 mg via ORAL
  Filled 2017-08-26 (×3): qty 1

## 2017-08-26 MED ORDER — FENTANYL CITRATE (PF) 100 MCG/2ML IJ SOLN
INTRAMUSCULAR | Status: AC
  Filled 2017-08-26: qty 2

## 2017-08-26 MED ORDER — POLYETHYLENE GLYCOL 3350 17 G PO PACK
17.0000 g | PACK | Freq: Every day | ORAL | Status: DC | PRN
  Filled 2017-08-26: qty 1

## 2017-08-26 MED ORDER — METOCLOPRAMIDE HCL 5 MG/ML IJ SOLN: 5.0000 mg | Freq: Three times a day (TID) | INTRAMUSCULAR | Status: DC | PRN

## 2017-08-26 MED ORDER — IPRATROPIUM-ALBUTEROL 20-100 MCG/ACT IN AERS: 2.0000 | INHALATION_SPRAY | RESPIRATORY_TRACT | Status: DC | PRN

## 2017-08-26 MED ORDER — ACETAMINOPHEN 10 MG/ML IV SOLN
INTRAVENOUS | Status: AC
  Filled 2017-08-26: qty 100

## 2017-08-26 MED ORDER — ACETAMINOPHEN 10 MG/ML IV SOLN
1000.0000 mg | Freq: Once | INTRAVENOUS | Status: AC
  Administered 2017-08-26: 1000 mg via INTRAVENOUS

## 2017-08-26 MED ORDER — PROPOFOL 10 MG/ML IV BOLUS
INTRAVENOUS | Status: AC
  Filled 2017-08-26: qty 20

## 2017-08-26 MED ORDER — BUPIVACAINE LIPOSOME 1.3 % IJ SUSP
20.0000 mL | Freq: Once | INTRAMUSCULAR | Status: DC
  Filled 2017-08-26: qty 20

## 2017-08-26 MED ORDER — ACETAMINOPHEN 650 MG RE SUPP: 650.0000 mg | RECTAL | Status: DC | PRN

## 2017-08-26 MED ORDER — BISACODYL 10 MG RE SUPP
10.0000 mg | Freq: Every day | RECTAL | Status: DC | PRN
Start: 2017-08-26 — End: 2017-08-27

## 2017-08-26 MED ORDER — FENTANYL CITRATE (PF) 100 MCG/2ML IJ SOLN
INTRAMUSCULAR | Status: DC | PRN
  Administered 2017-08-26: 150 ug via INTRAVENOUS
  Administered 2017-08-26: 100 ug via INTRAVENOUS

## 2017-08-26 MED ORDER — METOCLOPRAMIDE HCL 5 MG PO TABS: 5.0000 mg | ORAL_TABLET | Freq: Three times a day (TID) | ORAL | Status: DC | PRN

## 2017-08-26 MED ORDER — PROPOFOL 10 MG/ML IV BOLUS
INTRAVENOUS | Status: DC | PRN
  Administered 2017-08-26: 200 mg via INTRAVENOUS

## 2017-08-26 MED ORDER — ROCURONIUM BROMIDE 50 MG/5ML IV SOSY
PREFILLED_SYRINGE | INTRAVENOUS | Status: AC
  Filled 2017-08-26: qty 5

## 2017-08-26 SURGICAL SUPPLY — 46 items
BAG SPEC THK2 15X12 ZIP CLS (MISCELLANEOUS) ×1
BAG ZIPLOCK 12X15 (MISCELLANEOUS) ×3 IMPLANT
BANDAGE ACE 6X5 VEL STRL LF (GAUZE/BANDAGES/DRESSINGS) ×3 IMPLANT
CLOSURE WOUND 1/2 X4 (GAUZE/BANDAGES/DRESSINGS)
CLOTH BEACON ORANGE TIMEOUT ST (SAFETY) ×3 IMPLANT
COVER SURGICAL LIGHT HANDLE (MISCELLANEOUS) ×3 IMPLANT
CUFF TOURN SGL QUICK 34 (TOURNIQUET CUFF) ×3
CUFF TRNQT CYL 34X4X40X1 (TOURNIQUET CUFF) ×1 IMPLANT
DRAPE U-SHAPE 47X51 STRL (DRAPES) ×3 IMPLANT
DRSG ADAPTIC 3X8 NADH LF (GAUZE/BANDAGES/DRESSINGS) ×3 IMPLANT
DRSG PAD ABDOMINAL 8X10 ST (GAUZE/BANDAGES/DRESSINGS) ×5 IMPLANT
DURAPREP 26ML APPLICATOR (WOUND CARE) ×3 IMPLANT
ELECT REM PT RETURN 15FT ADLT (MISCELLANEOUS) ×3 IMPLANT
EVACUATOR 1/8 PVC DRAIN (DRAIN) ×3 IMPLANT
GAUZE SPONGE 4X4 12PLY STRL (GAUZE/BANDAGES/DRESSINGS) ×3 IMPLANT
GLOVE BIO SURGEON STRL SZ7.5 (GLOVE) ×3 IMPLANT
GLOVE BIO SURGEON STRL SZ8 (GLOVE) ×3 IMPLANT
GLOVE BIOGEL PI IND STRL 8 (GLOVE) ×1 IMPLANT
GLOVE BIOGEL PI INDICATOR 8 (GLOVE)
GOWN STRL REUS W/TWL LRG LVL3 (GOWN DISPOSABLE) ×5 IMPLANT
GOWN STRL REUS W/TWL XL LVL3 (GOWN DISPOSABLE) ×3 IMPLANT
HANDPIECE INTERPULSE COAX TIP (DISPOSABLE) ×3
IMMOBILIZER KNEE 20 (SOFTGOODS) ×3
IMMOBILIZER KNEE 20 THIGH 36 (SOFTGOODS) ×1 IMPLANT
INSERT TIBIAL RC3 RP SZ 4 30.0 (Insert) ×2 IMPLANT
MANIFOLD NEPTUNE II (INSTRUMENTS) ×3 IMPLANT
NS IRRIG 1000ML POUR BTL (IV SOLUTION) ×3 IMPLANT
PACK TOTAL KNEE CUSTOM (KITS) ×3 IMPLANT
PADDING CAST COTTON 6X4 STRL (CAST SUPPLIES) ×6 IMPLANT
POSITIONER SURGICAL ARM (MISCELLANEOUS) ×3 IMPLANT
SET HNDPC FAN SPRY TIP SCT (DISPOSABLE) ×1 IMPLANT
SPONGE LAP 18X18 X RAY DECT (DISPOSABLE) ×1 IMPLANT
STAPLER VISISTAT 35W (STAPLE) ×5 IMPLANT
STRIP CLOSURE SKIN 1/2X4 (GAUZE/BANDAGES/DRESSINGS) ×2 IMPLANT
SUT PDS AB 1 CT1 27 (SUTURE) ×5 IMPLANT
SUT STRATAFIX PDO 1 14 VIOLET (SUTURE) ×3
SUT STRATFX PDO 1 14 VIOLET (SUTURE) ×1
SUT VIC AB 2-0 CT1 27 (SUTURE) ×9
SUT VIC AB 2-0 CT1 TAPERPNT 27 (SUTURE) ×3 IMPLANT
SUT VLOC 180 0 24IN GS25 (SUTURE) IMPLANT
SUTURE STRATFX PDO 1 14 VIOLET (SUTURE) IMPLANT
SWAB COLLECTION DEVICE MRSA (MISCELLANEOUS) ×3 IMPLANT
SWAB CULTURE ESWAB REG 1ML (MISCELLANEOUS) ×3 IMPLANT
TRAY FOLEY W/METER SILVER 16FR (SET/KITS/TRAYS/PACK) ×3 IMPLANT
WATER STERILE IRR 1000ML POUR (IV SOLUTION) ×1 IMPLANT
WRAP KNEE MAXI GEL POST OP (GAUZE/BANDAGES/DRESSINGS) ×4 IMPLANT

## 2017-08-26 NOTE — Anesthesia Preprocedure Evaluation (Signed)
Anesthesia Evaluation  Patient identified by MRN, date of birth, ID band Patient awake    Reviewed: Allergy & Precautions, NPO status , Patient's Chart, lab work & pertinent test results, reviewed documented beta blocker date and time   History of Anesthesia Complications (+) PONV and history of anesthetic complications  Airway Mallampati: II  TM Distance: >3 FB Neck ROM: Full    Dental no notable dental hx.    Pulmonary asthma , former smoker,    Pulmonary exam normal breath sounds clear to auscultation       Cardiovascular hypertension, Pt. on medications and Pt. on home beta blockers Normal cardiovascular exam Rhythm:Regular Rate:Normal     Neuro/Psych negative neurological ROS  negative psych ROS   GI/Hepatic negative GI ROS, Neg liver ROS,   Endo/Other  Morbid obesity  Renal/GU negative Renal ROS  negative genitourinary   Musculoskeletal  (+) Arthritis , Osteoarthritis,    Abdominal (+) + obese,   Peds negative pediatric ROS (+)  Hematology  (+) Blood dyscrasia, anemia ,   Anesthesia Other Findings   Reproductive/Obstetrics negative OB ROS                             Anesthesia Physical  Anesthesia Plan  ASA: III  Anesthesia Plan: General   Post-op Pain Management:    Induction: Intravenous  PONV Risk Score and Plan: 3 and Ondansetron, Dexamethasone, Midazolam and Scopolamine patch - Pre-op  Airway Management Planned: Oral ETT  Additional Equipment:   Intra-op Plan:   Post-operative Plan: Extubation in OR  Informed Consent: I have reviewed the patients History and Physical, chart, labs and discussed the procedure including the risks, benefits and alternatives for the proposed anesthesia with the patient or authorized representative who has indicated his/her understanding and acceptance.   Dental advisory given  Plan Discussed with: CRNA and Surgeon  Anesthesia  Plan Comments:         Anesthesia Quick Evaluation

## 2017-08-26 NOTE — Transfer of Care (Signed)
Immediate Anesthesia Transfer of Care Note  Patient: [name]  Procedure(s) Performed: IRRIGATION AND DEBRIDEMENT KNEE WITH POLY EXCHANGE (Right Knee)  Patient Location: PACU  Anesthesia Type:General  Level of Consciousness: sedated and patient cooperative  Airway & Oxygen Therapy: Patient Spontanous Breathing and Patient connected to face mask oxygen  Post-op Assessment: Report given to RN and Post -op Vital signs reviewed and stable  Post vital signs: Reviewed and stable  Last Vitals:  Vitals:   08/26/17 1227  BP: 133/76  Pulse: 64  Resp: 18  Temp: 36.8 C  SpO2: 98%    Last Pain:  Vitals:   08/26/17 1227  TempSrc: Oral      Patients Stated Pain Goal: 4 (08/26/17 1307)  Complications: No apparent anesthesia complications

## 2017-08-26 NOTE — H&P (Signed)
CC- [name] is a 56 y.o. male who presents with right knee recurrent bursal effusions.  HPI- . Knee Pain: Patient presents with knee swelling involving the  right knee. Onset of the symptoms was several months ago. Inciting event: none known. He has a long complex history in regards to his right knee with multiple surgical procedures including 2 stage revision for infection in 2015. He has had a persistent pre-patellar bursitis for several months now treated with serial aspirations and a bursectomy without improvement. It is felt that the swelling may potentially be coming from the joint and he presents now for irrigation and debridement with polyethylene liner exchange  Past Medical History:  Diagnosis Date  . Anemia    hx of  . Asthma   . Cellulitis of lower extremity    "usually RLL; this time it's got up to my lower abdoment" (02/11/2014)-series of antibiotics completed 02-28-14  . Diverticulosis   . Hypertension   . Infection of right knee ([place])   . OSA (obstructive sleep apnea)    "quit wearing mask several years ago" (02/11/2014)  . Pneumonia   . PONV (postoperative nausea and vomiting)   . Rheumatoid arthritis ([place]) dx'd ~ 1977  . S/P PICC central line placement 03-16-14   right upper arm remains intact.05-11-14 removed 1 week ago.    Past Surgical History:  Procedure Laterality Date  . EXCISIONAL HEMORRHOIDECTOMY    . FOREIGN BODY REMOVAL Left    "BB" removal above left eye-teen yrs.  . picc line placement Right    right upper arm  . 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 REVISION TOTAL KNEE ARTHROPLASTY Right 2012    Prior to Admission medications   Medication Sig Start Date End Date Taking? Authorizing Provider  amoxicillin-clavulanate (AUGMENTIN) 875-125 MG tablet Take 1 tablet by mouth 2 (two) times daily. Patient taking differently:  Take 1 tablet 3 (three) times daily by mouth.  02/12/17  Yes Comer, Okey Regal, MD  diltiazem (CARDIZEM CD) 240 MG 24 hr capsule Take 240 mg by mouth daily.  11/12/10  Yes [provider]  Fluticasone-Salmeterol (ADVAIR) 250-50 MCG/DOSE AEPB Inhale 1 puff 2 (two) times daily as needed into the lungs (for shortness of breath or wheezing).   Yes [provider]  folic acid (FOLVITE) 1 MG tablet Take 1 mg by mouth daily.   Yes [provider]  furosemide (LASIX) 40 MG tablet Take 40 mg by mouth daily.    Yes [provider]  ibuprofen (ADVIL,MOTRIN) 200 MG tablet Take 600 mg every 6 (six) hours as needed by mouth for headache or moderate pain.   Yes [provider]  Ipratropium-Albuterol (COMBIVENT RESPIMAT) 20-100 MCG/ACT AERS respimat Inhale 2 puffs every 4 (four) hours as needed into the lungs for wheezing or shortness of breath.    Yes [provider]  losartan (COZAAR) 100 MG tablet Take 100 mg by mouth daily.  11/12/10  Yes [provider]  methotrexate (RHEUMATREX) 2.5 MG tablet Take 17.5 mg every Thursday by mouth. Caution:Chemotherapy. Protect from light.   Yes [provider]  metoprolol (TOPROL-XL) 50 MG 24 hr tablet Take 50 mg by mouth daily.  11/12/10  Yes [provider]  nystatin cream (MYCOSTATIN) Apply 1 application as needed topically (for genital area only while on antiobiotic).   Yes [provider]  Probiotic Product (PROBIOTIC PO) Take 1 tablet 2 (two) times daily by mouth.    Yes [provider]  RABEprazole (ACIPHEX) 20 MG tablet Take 20 mg by mouth daily.   Yes [provider]  methocarbamol (ROBAXIN) 500 MG tablet Take 1 tablet (500 mg total) by mouth every 6 (six) hours as needed for muscle spasms. Patient not taking: Reported on 08/22/2017 10/04/16   Dara Lords, Alexzandrew L, PA-C  oxyCODONE (ROXICODONE) 5 MG immediate release tablet Take 1 tablet (5 mg total) by mouth every 8  (eight) hours as needed. Patient not taking: Reported on 08/22/2017 03/13/17 03/13/18  Gaynelle Arabian, MD  traMADol (ULTRAM) 50 MG tablet Take 1-2 tablets (50-100 mg total) by mouth every 6 (six) hours as needed (mild pain). Patient not taking: Reported on 08/22/2017 03/13/17   Gaynelle Arabian, MD   Right Knee Exam antalgic gait, soft tissue tenderness over anterior knee, no warmth or intraarticular effusion, significant swelling pre-patellar bursa collateral ligaments intact  Physical Examination: General appearance - alert, well appearing, and in no distress Mental status - alert, oriented to person, place, and time Chest - clear to auscultation, no wheezes, rales or rhonchi, symmetric air entry Heart - normal rate, regular rhythm, normal S1, S2, no murmurs, rubs, clicks or gallops Abdomen - soft, nontender, nondistended, no masses or organomegaly Neurological - alert, oriented, normal speech, no focal findings or movement disorder noted   Asessment/Plan--- Rightt knee recurrent swelling with history of infection - Plan right knee irrigation and  debridement. Procedure risks and potential comps discussed with patient who elects to proceed. Goals are decreased pain and increased function with a high likelihood of achieving both

## 2017-08-26 NOTE — Anesthesia Postprocedure Evaluation (Signed)
Anesthesia Post Note  Patient: [name]  Procedure(s) Performed: IRRIGATION AND DEBRIDEMENT KNEE WITH POLY EXCHANGE (Right Knee)     Patient location during evaluation: PACU Anesthesia Type: General Level of consciousness: awake and sedated Pain management: pain level controlled Vital Signs Assessment: post-procedure vital signs reviewed and stable Respiratory status: spontaneous breathing Cardiovascular status: stable Postop Assessment: no apparent nausea or vomiting Anesthetic complications: no    Last Vitals:  Vitals:   08/26/17 1227 08/26/17 1715  BP: 133/76   Pulse: 64   Resp: 18   Temp: 36.8 C 36.6 C  SpO2: 98%     Last Pain:  Vitals:   08/26/17 1730  TempSrc:   PainSc: 4    Pain Goal: Patients Stated Pain Goal: 4 (08/26/17 1307)               Kenosha Doster JR,JOHN Susann Givens

## 2017-08-26 NOTE — Brief Op Note (Signed)
08/26/2017  4:27 PM  PATIENT:  [name]  56 y.o. male  PRE-OPERATIVE DIAGNOSIS:  Right knee recurrent effusion  POST-OPERATIVE DIAGNOSIS:  Right knee recurrent effusion  PROCEDURE:  Procedure(s): IRRIGATION AND DEBRIDEMENT KNEE WITH POLY EXCHANGE (Right)  SURGEON:  Surgeon(s) and Role:    Gaynelle Arabian, MD - Primary  PHYSICIAN ASSISTANT:   ASSISTANTS: Griffith Citron, PA-C   ANESTHESIA:   general  EBL:  25 ml  BLOOD ADMINISTERED:none  DRAINS: (Medium) Hemovact drain(s) in the right knee with  Suction Open   LOCAL MEDICATIONS USED:  NONE  COUNTS:  YES  TOURNIQUET:   Total Tourniquet Time Documented: Thigh (Right) - 35 minutes Total: Thigh (Right) - 35 minutes   DICTATION: .Other Dictation: Dictation Number [id number]  PLAN OF CARE: Admit to inpatient   PATIENT DISPOSITION:  PACU - hemodynamically stable.

## 2017-08-26 NOTE — Anesthesia Procedure Notes (Signed)
Procedure Name: Intubation Date/Time: 08/26/2017 3:17 PM Performed by: Florene Route, CRNA Patient Re-evaluated:Patient Re-evaluated prior to induction Oxygen Delivery Method: Circle system utilized Preoxygenation: Pre-oxygenation with 100% oxygen Induction Type: IV induction Ventilation: Mask ventilation without difficulty and Oral airway inserted - appropriate to patient size Laryngoscope Size: Miller and 3 Grade View: Grade I Tube type: Oral Tube size: 8.0 mm Number of attempts: 1 Airway Equipment and Method: Stylet Placement Confirmation: ETT inserted through vocal cords under direct vision,  positive ETCO2 and breath sounds checked- equal and bilateral Secured at: 22 cm Tube secured with: Tape Dental Injury: Teeth and Oropharynx as per pre-operative assessment

## 2017-08-27 ENCOUNTER — Encounter (HOSPITAL_COMMUNITY): Payer: Self-pay | Admitting: Orthopedic Surgery

## 2017-08-27 DIAGNOSIS — M25461 Effusion, right knee: Secondary | ICD-10-CM | POA: Diagnosis not present

## 2017-08-27 LAB — CBC
HCT: 35.6 % — ABNORMAL LOW (ref 39.0–52.0)
Hemoglobin: 11 g/dL — ABNORMAL LOW (ref 13.0–17.0)
MCH: 25.5 pg — AB (ref 26.0–34.0)
MCHC: 30.9 g/dL (ref 30.0–36.0)
MCV: 82.6 fL (ref 78.0–100.0)
PLATELETS: 295 10*3/uL (ref 150–400)
RBC: 4.31 MIL/uL (ref 4.22–5.81)
RDW: 15.8 % — ABNORMAL HIGH (ref 11.5–15.5)
WBC: 9.1 10*3/uL (ref 4.0–10.5)

## 2017-08-27 LAB — BASIC METABOLIC PANEL
ANION GAP: 6 (ref 5–15)
Anion gap: 4 — ABNORMAL LOW (ref 5–15)
BUN: 14 mg/dL (ref 6–20)
BUN: 14 mg/dL (ref 6–20)
CHLORIDE: 107 mmol/L (ref 101–111)
CHLORIDE: 106 mmol/L (ref 101–111)
CO2: 28 mmol/L (ref 22–32)
CO2: 24 mmol/L (ref 22–32)
Calcium: 8.5 mg/dL — ABNORMAL LOW (ref 8.9–10.3)
Calcium: 8.6 mg/dL — ABNORMAL LOW (ref 8.9–10.3)
Creatinine, Ser: 0.96 mg/dL (ref 0.61–1.24)
Creatinine, Ser: 0.91 mg/dL (ref 0.61–1.24)
GFR calc Af Amer: >60 mL/min (ref >60)
GFR calc Af Amer: >60 mL/min (ref >60)
GFR calc non Af Amer: >60 mL/min (ref >60)
GFR calc non Af Amer: >60 mL/min (ref >60)
GLUCOSE: 161 mg/dL — AB (ref 65–99)
GLUCOSE: 132 mg/dL — AB (ref 65–99)
POTASSIUM: 5.4 mmol/L — AB (ref 3.5–5.1)
POTASSIUM: 4.5 mmol/L (ref 3.5–5.1)
Sodium: 139 mmol/L (ref 135–145)
Sodium: 136 mmol/L (ref 135–145)

## 2017-08-27 MED ORDER — ASPIRIN 325 MG PO TBEC: 325.0000 mg | DELAYED_RELEASE_TABLET | Freq: Every day | ORAL | 0 refills | Status: DC

## 2017-08-27 MED ORDER — METHOCARBAMOL 500 MG PO TABS: 500.0000 mg | ORAL_TABLET | Freq: Four times a day (QID) | ORAL | 0 refills | Status: DC | PRN

## 2017-08-27 MED ORDER — OXYCODONE HCL 5 MG PO TABS: 5.0000 mg | ORAL_TABLET | ORAL | 0 refills | Status: DC | PRN

## 2017-08-27 MED ORDER — TRAMADOL HCL 50 MG PO TABS: 50.0000 mg | ORAL_TABLET | Freq: Four times a day (QID) | ORAL | 0 refills | Status: DC | PRN

## 2017-08-27 NOTE — Progress Notes (Addendum)
   Subjective: 1 Day Post-Op Procedure(s) (LRB): IRRIGATION AND DEBRIDEMENT KNEE WITH POLY EXCHANGE (Right) Patient reports pain as mild.   Patient seen in rounds for Dr. Wynelle Link. Patient is well, but has had some minor complaints of pain in the knee, requiring pain medications Patient is ready to go home later today.  Objective: Vital signs in last 24 hours: Temp:  [97.5 F (36.4 C)-98.2 F (36.8 C)] 97.6 F (36.4 C) (11/13 0533) Pulse Rate:  [52-72] 72 (11/13 0533) Resp:  [10-18] 15 (11/13 0533) BP: (114-142)/(71-88) 129/87 (11/13 0533) SpO2:  [98 %-100 %] 100 % (11/13 0533) Weight:  [141.1 kg (311 lb)] 141.1 kg (311 lb) (11/12 1307)  Intake/Output from previous day:  Intake/Output Summary (Last 24 hours) at 08/27/2017 0905 Last data filed at 08/27/2017 0743 Gross per 24 hour  Intake 3020 ml  Output 1450 ml  Net 1570 ml    Intake/Output this shift: Total I/O In: 240 [P.O.:240] Out: -   Labs: Recent Labs    08/27/17 0520  HGB 11.0*   Recent Labs    08/27/17 0520  WBC 9.1  RBC 4.31  HCT 35.6*  PLT 295   Recent Labs    08/27/17 0520  NA 139  K 5.4*  CL 107  CO2 28  BUN 14  CREATININE 0.96  GLUCOSE 161*  CALCIUM 8.5*   No results for input(s): LABPT, INR in the last 72 hours.  EXAM: General - Patient is Alert and Appropriate Extremity - Neurovascular intact Sensation intact distally Intact pulses distally Dorsiflexion/Plantar flexion intact Dressing - clean, dry Motor Function - intact, moving foot and toes well on exam.  Hemovac patent and draining.  Assessment/Plan: 1 Day Post-Op Procedure(s) (LRB): IRRIGATION AND DEBRIDEMENT KNEE WITH POLY EXCHANGE (Right) Procedure(s) (LRB): IRRIGATION AND DEBRIDEMENT KNEE WITH POLY EXCHANGE (Right) Past Medical History:  Diagnosis Date  . Anemia    hx of  . Asthma   . Cellulitis of lower extremity    "usually RLL; this time it's got up to my lower abdoment" (02/11/2014)-series of antibiotics  completed 02-28-14  . Diverticulosis   . Hypertension   . Infection of right knee ([place])   . OSA (obstructive sleep apnea)    "quit wearing mask several years ago" (02/11/2014)  . Pneumonia   . PONV (postoperative nausea and vomiting)   . Rheumatoid arthritis ([place]) dx'd ~ 1977  . S/P PICC central line placement 03-16-14   right upper arm remains intact.05-11-14 removed 1 week ago.   Active Problems:   Septic arthritis of knee ([facility])  Estimated body mass index is 47.29 kg/m as calculated from the following:   Height as of this encounter: 5\' 8"  (1.727 m).   Weight as of this encounter: 141.1 kg (311 lb). Up with therapy Diet - Cardiac diet Follow up - in 1 week Activity - WBAT Disposition - Home Condition Upon Discharge - Stable D/C Meds - See DC Summary DVT Prophylaxis - Aspirin 325 mg daily  Vincent Muslim, PA-C Orthopaedic Surgery 08/27/2017, 9:05 AM

## 2017-08-27 NOTE — Evaluation (Addendum)
Occupational Therapy Evaluation Patient Details Name: SHERRILL MCKAMIE MRN: [id number] DOB: 11/30/1960 Today's Date: 08/27/2017    History of Present Illness s/p  I and D with polyexchange R knee; Hx: multiple knee surgeries   Clinical Impression   Pt doing well and is hopeful for d/c today. Wife plans to assist with LB self care. Will follow if pt here after today but feel pt is ok to d/c today from OT standpoint also.     Follow Up Recommendations  No OT follow up    Equipment Recommendations  None recommended by OT    Recommendations for Other Services       Precautions / Restrictions Precautions Precautions: Fall;Knee Required Braces or Orthoses: Knee Immobilizer - Right Knee Immobilizer - Right: Discontinue once straight leg raise with < 10 degree lag Restrictions Weight Bearing Restrictions: No Other Position/Activity Restrictions: WBAT      Mobility Bed Mobility Overal bed mobility: Needs Assistance Bed Mobility: Supine to Sit     Supine to sit: Min guard     General bed mobility comments: in chair.   Transfers Overall transfer level: Needs assistance Equipment used: Rolling walker (2 wheeled) Transfers: Sit to/from Stand Sit to Stand: Min guard         General transfer comment: verbal cues for safety.    Balance                                           ADL either performed or assessed with clinical judgement   ADL Overall ADL's : Needs assistance/impaired Eating/Feeding: Independent;Sitting   Grooming: Wash/dry hands;Set up;Sitting   Upper Body Bathing: Set up;Sitting   Lower Body Bathing: Moderate assistance;Sit to/from stand   Upper Body Dressing : Set up;Sitting   Lower Body Dressing: Maximal assistance;Sit to/from stand   Toilet Transfer: Min guard;Ambulation;RW   Toileting- Clothing Manipulation and Hygiene: Minimal assistance;Sit to/from stand   Tub/ Shower Transfer: Walk-in shower;Minimal  assistance;Ambulation;Rolling walker     General ADL Comments: Educated pt and spouse on shower transfer technique and discussed wearing KI to step in and out of shower until pt is able to SLR with R LE. He has a seat for the shower and spouse plans to assist with LB dressing. Did discuss AE and pt has a reacher and long shoe horn and is familiar with use. Pt had some difficulty with descending to comfort height commode and has a handicapped toilet at home with a counter. Recommeded pt use his 3in1 at lease initially to be able to use handles to descend to commode safer. Pt verbalized understanding of rcommendation.     Vision Patient Visual Report: No change from baseline       Perception     Praxis      Pertinent Vitals/Pain Pain Assessment: 0-10 Pain Score: 3  Pain Location: R knee Pain Descriptors / Indicators: Sore Pain Intervention(s): Monitored during session;Ice applied     Hand Dominance     Extremity/Trunk Assessment Upper Extremity Assessment Upper Extremity Assessment: Overall WFL for tasks assessed          Communication Communication Communication: No difficulties   Cognition Arousal/Alertness: Awake/alert Behavior During Therapy: WFL for tasks assessed/performed Overall Cognitive Status: Within Functional Limits for tasks assessed  General Comments       Exercises     Shoulder Instructions      Home Living Family/patient expects to be discharged to:: Private residence Living Arrangements: Spouse/significant other Available Help at Discharge: Family Type of Home: House Home Access: Stairs to enter [employer] of Steps: 4-front, 3 back Entrance Stairs-Rails: Right Home Layout: One level     Bathroom Shower/Tub: Occupational psychologist: Handicapped height     Home Equipment: Environmental consultant - 2 wheels;Cane - single point;Crutches;Bedside commode;Shower seat;Wheelchair - manual;Hand  held shower head;Adaptive equipment Adaptive Equipment: Reacher;Long-handled shoe horn        Prior Functioning/Environment Level of Independence: Independent                 OT Problem List: Decreased strength;Decreased knowledge of use of DME or AE      OT Treatment/Interventions: Self-care/ADL training;DME and/or AE instruction;Patient/family education;Therapeutic activities    OT Goals(Current goals can be found in the care plan section) Acute Rehab OT Goals Patient Stated Goal: home today OT Goal Formulation: With patient Time For Goal Achievement: 09/03/17 Potential to Achieve Goals: Good ADL Goals Pt Will Transfer to Toilet: with supervision;ambulating;bedside commode Pt Will Perform Toileting - Clothing Manipulation and hygiene: with supervision;sit to/from stand Pt Will Perform Tub/Shower Transfer: with min guard assist;rolling walker;shower seat  OT Frequency: Min 2X/week   Barriers to D/C:            Co-evaluation              AM-PAC PT "6 Clicks" Daily Activity     Outcome Measure Help from another person eating meals?: None Help from another person taking care of personal grooming?: A Little Help from another person toileting, which includes using toliet, bedpan, or urinal?: A Little Help from another person bathing (including washing, rinsing, drying)?: A Little Help from another person to put on and taking off regular upper body clothing?: None Help from another person to put on and taking off regular lower body clothing?: A Lot 6 Click Score: 19   End of Session Equipment Utilized During Treatment: Rolling walker;Right knee immobilizer  Activity Tolerance: Patient tolerated treatment well Patient left: in chair;with call bell/phone within reach;with family/visitor present  OT Visit Diagnosis: Muscle weakness (generalized) (M62.81)                Time: 7001-7494 OT Time Calculation (min): 19 min Charges:  OT General Charges $OT Visit: 1  Visit OT Evaluation $OT Eval Low Complexity: 1 Low G-Codes:       Jae Dire Amias Hutchinson 08/27/2017, 12:17 PM

## 2017-08-27 NOTE — Progress Notes (Signed)
08/27/17 1500  PT Visit Information  Last PT Received On 08/27/17--pt doing well; pt and wife feel comfortable as they are familiar with process from previous surgeries, they  have all necessary DME at home; pt to perform HEP later today at home, verbalizes understanding after handout reviewed  Assistance Needed +1  History of Present Illness s/p  I and D with polyexchange R knee; Hx: multiple knee surgeries  Subjective Data  Patient Stated Goal home today  Precautions  Precautions Fall;Knee  Required Braces or Orthoses Knee Immobilizer - Right  Knee Immobilizer - Right Discontinue once straight leg raise with < 10 degree lag  Restrictions  Weight Bearing Restrictions No  Other Position/Activity Restrictions WBAT  Pain Assessment  Pain Assessment 0-10  Pain Score 3  Pain Location R knee  Pain Descriptors / Indicators Sore  Pain Intervention(s) Monitored during session  Cognition  Arousal/Alertness Awake/alert  Behavior During Therapy WFL for tasks assessed/performed  Overall Cognitive Status Within Functional Limits for tasks assessed  Bed Mobility  General bed mobility comments in chair.   Transfers  Overall transfer level Needs assistance  Equipment used Rolling walker (2 wheeled)  Transfers Sit to/from Stand  Sit to Stand Supervision  General transfer comment verbal cues for safety.  Ambulation/Gait  Ambulation/Gait assistance Supervision;Modified independent (Device/Increase time)  Ambulation Distance (Feet) 200 Feet  Assistive device Rolling walker (2 wheeled)  Gait Pattern/deviations Step-through pattern;Decreased stride length  General Gait Details cues for sequence and RW position  Stairs Yes  Stairs assistance Min guard  Stair Management One rail Right;With crutches;Forwards;Step to pattern  Number of Stairs 3  General stair comments cues for sequence  Exercises  Exercises (reviewed HEP/handout, pt to perform later)  PT - End of Session  Equipment Utilized  During Treatment Gait belt;Right knee immobilizer  Activity Tolerance Patient tolerated treatment well  Patient left in bed;with call bell/phone within reach;with family/visitor present  Nurse Communication Mobility status  PT - Assessment/Plan  PT Plan Current plan remains appropriate  PT Visit Diagnosis Difficulty in walking, not elsewhere classified (R26.2)  PT Frequency (ACUTE ONLY) 7X/week  Follow Up Recommendations DC plan and follow up therapy as arranged by surgeon  PT equipment None recommended by PT  AM-PAC PT "6 Clicks" Daily Activity Outcome Measure  Difficulty turning over in bed (including adjusting bedclothes, sheets and blankets)? 1  Difficulty moving from lying on back to sitting on the side of the bed?  3  Difficulty sitting down on and standing up from a chair with arms (e.g., wheelchair, bedside commode, etc,.)? 3  Help needed moving to and from a bed to chair (including a wheelchair)? 3  Help needed walking in hospital room? 3  Help needed climbing 3-5 steps with a railing?  3  6 Click Score 16  Mobility G Code  CK  PT Goal Progression  Progress towards PT goals Progressing toward goals  Acute Rehab PT Goals  PT Goal Formulation With patient  Time For Goal Achievement 08/30/17  Potential to Achieve Goals Good  PT Time Calculation  PT Start Time (ACUTE ONLY) 1328  PT Stop Time (ACUTE ONLY) 1348  PT Time Calculation (min) (ACUTE ONLY) 20 min  PT G-Codes **NOT FOR INPATIENT CLASS**  Functional Assessment Tool Used AM-PAC 6 Clicks Basic Mobility;Clinical judgement  Functional Limitation Mobility: Walking and moving around  Mobility: Walking and Moving Around Goal Status (H8527) CI  Mobility: Walking and Moving Around Discharge Status (P8242) CI  PT General Charges  $$ ACUTE  PT VISIT 1 Visit  PT Treatments  $Gait Training 8-22 mins

## 2017-08-27 NOTE — Progress Notes (Signed)
Discharge planning, no HH needs identified. [phone] 

## 2017-08-27 NOTE — Discharge Summary (Signed)
Physician Discharge Summary   Patient ID: [name] MRN: [id number] DOB/AGE: 56-06-1961 56 y.o.  Admit date: 08/26/2017 Discharge date: 08-27-2017  Primary Diagnosis:  Right knee recurrent effusions and prepatellar bursitis.   Admission Diagnoses:  Past Medical History:  Diagnosis Date  . Anemia    hx of  . Asthma   . Cellulitis of lower extremity    "usually RLL; this time it's got up to my lower abdoment" (02/11/2014)-series of antibiotics completed 02-28-14  . Diverticulosis   . Hypertension   . Infection of right knee ([place])   . OSA (obstructive sleep apnea)    "quit wearing mask several years ago" (02/11/2014)  . Pneumonia   . PONV (postoperative nausea and vomiting)   . Rheumatoid arthritis ([place]) dx'd ~ 1977  . S/P PICC central line placement 03-16-14   right upper arm remains intact.05-11-14 removed 1 week ago.   Discharge Diagnoses:   Active Problems:   Septic arthritis of knee (HCC)  Estimated body mass index is 47.29 kg/m as calculated from the following:   Height as of this encounter: '5\' 8"'$  (1.727 m).   Weight as of this encounter: 141.1 kg (311 lb).  Procedure:  Procedure(s) (LRB): IRRIGATION AND DEBRIDEMENT KNEE WITH POLY EXCHANGE (Right)   Consults: None  HPI: [name] is a 56 year old male with a long very complex history in regard to his right knee.  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 He has had a recurrent bursal effusion.  He had a bursectomy, did not eliminate the issue.  It was felt now that the fluid was potentially communicating with the joint.  He presents now for irrigation, debridement and polyethylene exchange.   Laboratory Data: Admission on 08/26/2017  Component Date Value Ref Range Status  . Specimen Description 08/26/2017 KNEE RIGHT   Final  . Special Requests 08/26/2017 NONE   Final  . Gram Stain 08/26/2017    Final                   Value:FEW WBC PRESENT,BOTH PMN AND  MONONUCLEAR NO ORGANISMS SEEN Performed at [facility], [place] [address], [place], [place] [address]   . Culture 08/26/2017 PENDING   Incomplete  . Report Status 08/26/2017 PENDING   Incomplete  . WBC 08/27/2017 9.1  4.0 - 10.5 K/uL Final  . RBC 08/27/2017 4.31  4.22 - 5.81 MIL/uL Final  . Hemoglobin 08/27/2017 11.0* 13.0 - 17.0 g/dL Final  . HCT 08/27/2017 35.6* 39.0 - 52.0 % Final  . MCV 08/27/2017 82.6  78.0 - 100.0 fL Final  . MCH 08/27/2017 25.5* 26.0 - 34.0 pg Final  . MCHC 08/27/2017 30.9  30.0 - 36.0 g/dL Final  . RDW 08/27/2017 15.8* 11.5 - 15.5 % Final  . Platelets 08/27/2017 295  150 - 400 K/uL Final  . Sodium 08/27/2017 139  135 - 145 mmol/L Final  . Potassium 08/27/2017 5.4* 3.5 - 5.1 mmol/L Final  . Chloride 08/27/2017 107  101 - 111 mmol/L Final  . CO2 08/27/2017 28  22 - 32 mmol/L Final  . Glucose, Bld 08/27/2017 161* 65 - 99 mg/dL Final  . BUN 08/27/2017 14  6 - 20 mg/dL Final  . Creatinine, Ser 08/27/2017 0.96  0.61 - 1.24 mg/dL Final  . Calcium 08/27/2017 8.5* 8.9 - 10.3 mg/dL Final  . GFR calc non Af Amer 08/27/2017 >60  >60 mL/min Final  . GFR calc Af Amer 08/27/2017 >  60  >60 mL/min Final   Comment: (NOTE) The eGFR has been calculated using the CKD EPI equation. This calculation has not been validated in all clinical situations. eGFR's persistently <60 mL/min signify possible Chronic Kidney Disease.   [name] gap 08/27/2017 4* 5 - 15 Final  Hospital Outpatient Visit on 08/23/2017  Component Date Value Ref Range Status  . aPTT 08/23/2017 29  24 - 36 seconds Final  . WBC 08/23/2017 6.8  4.0 - 10.5 K/uL Final  . RBC 08/23/2017 4.66  4.22 - 5.81 MIL/uL Final  . Hemoglobin 08/23/2017 11.9* 13.0 - 17.0 g/dL Final  . HCT 08/23/2017 38.4* 39.0 - 52.0 % Final  . MCV 08/23/2017 82.4  78.0 - 100.0 fL Final  . MCH 08/23/2017 25.5* 26.0 - 34.0 pg Final  . MCHC 08/23/2017 31.0  30.0 - 36.0 g/dL Final  . RDW 08/23/2017 16.4* 11.5 - 15.5 % Final  . Platelets  08/23/2017 317  150 - 400 K/uL Final  . Sodium 08/23/2017 140  135 - 145 mmol/L Final  . Potassium 08/23/2017 4.5  3.5 - 5.1 mmol/L Final  . Chloride 08/23/2017 109  101 - 111 mmol/L Final  . CO2 08/23/2017 25  22 - 32 mmol/L Final  . Glucose, Bld 08/23/2017 90  65 - 99 mg/dL Final  . BUN 08/23/2017 16  6 - 20 mg/dL Final  . Creatinine, Ser 08/23/2017 0.81  0.61 - 1.24 mg/dL Final  . Calcium 08/23/2017 8.6* 8.9 - 10.3 mg/dL Final  . Total Protein 08/23/2017 6.8  6.5 - 8.1 g/dL Final  . Albumin 08/23/2017 3.3* 3.5 - 5.0 g/dL Final  . AST 08/23/2017 18  15 - 41 U/L Final  . ALT 08/23/2017 12* 17 - 63 U/L Final  . Alkaline Phosphatase 08/23/2017 62  38 - 126 U/L Final  . Total Bilirubin 08/23/2017 0.5  0.3 - 1.2 mg/dL Final  . GFR calc non Af Amer 08/23/2017 >60  >60 mL/min Final  . GFR calc Af Amer 08/23/2017 >60  >60 mL/min Final   Comment: (NOTE) The eGFR has been calculated using the CKD EPI equation. This calculation has not been validated in all clinical situations. eGFR's persistently <60 mL/min signify possible Chronic Kidney Disease.   . Anion gap 08/23/2017 6  5 - 15 Final  . Prothrombin Time 08/23/2017 13.0  11.4 - 15.2 seconds Final  . INR 08/23/2017 0.99   Final  . ABO/RH(D) 08/23/2017 O POS   Final  . Antibody Screen 08/23/2017 NEG   Final  . Sample Expiration 08/23/2017 08/29/2017   Final  . Extend sample reason 08/23/2017 NO TRANSFUSIONS OR PREGNANCY IN THE PAST 3 MONTHS   Final  . MRSA, PCR 08/23/2017 NEGATIVE  NEGATIVE Final  . Staphylococcus aureus 08/23/2017 NEGATIVE  NEGATIVE Final   Comment: (NOTE) The Xpert SA Assay (FDA approved for NASAL specimens in patients 81 years of age and older), is one component of a comprehensive surveillance program. It is not intended to diagnose infection nor to guide or monitor treatment.      X-Rays:No results found.  EKG: Orders placed or performed during the hospital encounter of 08/23/17  . EKG 12 lead  . EKG 12 lead      Hospital Course: [name] is a 56 y.o. who was admitted to [facility]. They were brought to the operating room on 08/26/2017 and underwent Procedure(s): [place].  Patient tolerated the procedure well and was later transferred  to the recovery room and then to the orthopaedic floor for postoperative care.  They were given PO and IV analgesics for pain control following their surgery.  They were given 24 hours of postoperative antibiotics of  Anti-infectives (From admission, onward)   Start     Dose/Rate Route Frequency Ordered Stop   08/26/17 2130  clindamycin (CLEOCIN) IVPB 600 mg     600 mg 100 mL/hr over 30 Minutes Intravenous Every 6 hours 08/26/17 1826 08/27/17 0628   08/26/17 1243  clindamycin (CLEOCIN) 900 MG/50ML IVPB    Comments:  [place] Session   : cabinet override      08/26/17 1243 08/26/17 1524   08/26/17 1230  clindamycin (CLEOCIN) IVPB 900 mg     900 mg 100 mL/hr over 30 Minutes Intravenous On call to O.R. 08/26/17 1220 08/26/17 1544     and started on DVT prophylaxis in the form of Aspirin.   PT and OT were ordered for total joint protocol.  Discharge planning consulted to help with postop disposition and equipment needs.  Patient had a good night on the evening of surgery.  They started to get up OOB with therapy on day one. Hemovac drain was patent. Patient was seen in rounds and was ready to go home later that same day.   Diet - Cardiac diet Follow up - in 1 week Activity - WBAT Disposition - Home Condition Upon Discharge - Stable D/C Meds - See DC Summary DVT Prophylaxis - Aspirin 325 mg daily    Discharge Instructions    Call MD / Call 911   Complete by:  As directed    If you experience chest pain or shortness of breath, CALL 911 and be transported to the hospital emergency room.  If you develope a fever above 101 F, pus (white drainage) or increased drainage or redness at the wound, or calf pain, call  your surgeon's office.   Change dressing   Complete by:  As directed    Change dressing daily with sterile 4 x 4 inch gauze dressing and apply TED hose. Do not submerge the incision under water.   Constipation Prevention   Complete by:  As directed    Drink plenty of fluids.  Prune juice may be helpful.  You may use a stool softener, such as Colace (over the counter) 100 mg twice a day.  Use MiraLax (over the counter) for constipation as needed.   Diet - low sodium heart healthy   Complete by:  As directed    Discharge instructions   Complete by:  As directed    Take a full dose Aspirin 325 mg daily for three weeks, then reduce to a baby 81 mg daily for three additional weeks.  Hold the Methotrexate for three weeks postop.  Pick up stool softner and laxative for home use following surgery while on pain medications. Do not submerge incision under water. Please use good hand washing techniques while changing dressing each day. May shower starting three days after surgery. Please use a clean towel to pat the incision dry following showers. Continue to use ice for pain and swelling after surgery. Do not use any lotions or creams on the incision until instructed by your surgeon.  Wear both TED hose on both legs during the day every day for three weeks, but may remove the TED hose at night at home.  Postoperative Constipation Protocol  Constipation - defined medically as fewer than three stools per week and severe constipation  as less than one stool per week.  One of the most common issues patients have following surgery is constipation.  Even if you have a regular bowel pattern at home, your normal regimen is likely to be disrupted due to multiple reasons following surgery.  Combination of anesthesia, postoperative narcotics, change in appetite and fluid intake all can affect your bowels.  In order to avoid complications following surgery, here are some recommendations in order to help you  during your recovery period.  Colace (docusate) - Pick up an over-the-counter form of Colace or another stool softener and take twice a day as long as you are requiring postoperative pain medications.  Take with a full glass of water daily.  If you experience loose stools or diarrhea, hold the colace until you stool forms back up.  If your symptoms do not get better within 1 week or if they get worse, check with your doctor.  Dulcolax (bisacodyl) - Pick up over-the-counter and take as directed by the product packaging as needed to assist with the movement of your bowels.  Take with a full glass of water.  Use this product as needed if not relieved by Colace only.   MiraLax (polyethylene glycol) - Pick up over-the-counter to have on hand.  MiraLax is a solution that will increase the amount of water in your bowels to assist with bowel movements.  Take as directed and can mix with a glass of water, juice, soda, coffee, or tea.  Take if you go more than two days without a movement. Do not use MiraLax more than once per day. Call your doctor if you are still constipated or irregular after using this medication for 7 days in a row.  If you continue to have problems with postoperative constipation, please contact the office for further assistance and recommendations.  If you experience "the worst abdominal pain ever" or develop nausea or vomiting, please contact the office immediatly for further recommendations for treatment.   Do not put a pillow under the knee. Place it under the heel.   Complete by:  As directed    Do not sit on low chairs, stoools or toilet seats, as it may be difficult to get up from low surfaces   Complete by:  As directed    Driving restrictions   Complete by:  As directed    No driving until released by the physician.   Increase activity slowly as tolerated   Complete by:  As directed    Lifting restrictions   Complete by:  As directed    No lifting until released by the  physician.   Patient may shower   Complete by:  As directed    You may shower without a dressing once there is no drainage.  Do not wash over the wound.  If drainage remains, do not shower until drainage stops.   Weight bearing as tolerated   Complete by:  As directed    Laterality:  right   Extremity:  Lower     Allergies as of 08/27/2017      Reactions   Avelox [moxifloxacin Hcl In Nacl] Hives, Shortness Of Breath, Itching   Doxycycline Itching   Rocephin [ceftriaxone Sodium In Dextrose] Hives, Itching   Sulfa Antibiotics Itching   Vancomycin Hives, Itching   Erythrocin Rash   Sulfasalazine Rash      Medication List    STOP taking these medications   ibuprofen 200 MG tablet Commonly known as:  ADVIL,MOTRIN  methotrexate 2.5 MG tablet Commonly known as:  RHEUMATREX     TAKE these medications   amoxicillin-clavulanate 875-125 MG tablet Commonly known as:  AUGMENTIN Take 1 tablet by mouth 2 (two) times daily. What changed:  when to take this   aspirin 325 MG EC tablet Take 1 tablet (325 mg total) daily by mouth. Take daily for three weeks, then reduce to a baby 81 mg Aspirin at home.   COMBIVENT RESPIMAT 20-100 MCG/ACT Aers respimat Generic drug:  Ipratropium-Albuterol Inhale 2 puffs every 4 (four) hours as needed into the lungs for wheezing or shortness of breath.   diltiazem 240 MG 24 hr capsule Commonly known as:  CARDIZEM CD Take 240 mg by mouth daily.   Fluticasone-Salmeterol 250-50 MCG/DOSE Aepb Commonly known as:  ADVAIR Inhale 1 puff 2 (two) times daily as needed into the lungs (for shortness of breath or wheezing).   folic acid 1 MG tablet Commonly known as:  FOLVITE Take 1 mg by mouth daily.   furosemide 40 MG tablet Commonly known as:  LASIX Take 40 mg by mouth daily.   losartan 100 MG tablet Commonly known as:  COZAAR Take 100 mg by mouth daily.   methocarbamol 500 MG tablet Commonly known as:  ROBAXIN Take 1 tablet (500 mg total) every 6  (six) hours as needed by mouth for muscle spasms.   metoprolol succinate 50 MG 24 hr tablet Commonly known as:  TOPROL-XL Take 50 mg by mouth daily.   nystatin cream Commonly known as:  MYCOSTATIN Apply 1 application as needed topically (for genital area only while on antiobiotic).   oxyCODONE 5 MG immediate release tablet Commonly known as:  Oxy IR/ROXICODONE Take 1 tablet (5 mg total) every 4 (four) hours as needed by mouth for moderate pain or severe pain. What changed:    when to take this  reasons to take this   PROBIOTIC PO Take 1 tablet 2 (two) times daily by mouth.   RABEprazole 20 MG tablet Commonly known as:  ACIPHEX Take 20 mg by mouth daily.   traMADol 50 MG tablet Commonly known as:  ULTRAM Take 1-2 tablets (50-100 mg total) every 6 (six) hours as needed by mouth (mild pain).            Discharge Care Instructions  (From admission, onward)        Start     Ordered   08/27/17 0000  Weight bearing as tolerated    Question Answer Comment  Laterality right   Extremity Lower      08/27/17 0917   08/27/17 0000  Change dressing    Comments:  Change dressing daily with sterile 4 x 4 inch gauze dressing and apply TED hose. Do not submerge the incision under water.   08/27/17 0932     Follow-up Information    Gaynelle Arabian, MD. Schedule an appointment as soon as possible for a visit on 09/03/2017.   Specialty:  Orthopedic Surgery Contact information: [address] [place] [address] [phone]           Signed: [name], PA-C Orthopaedic Surgery 08/27/2017, 9:18 AM

## 2017-08-27 NOTE — Op Note (Signed)
NAMEHARDIE, VELTRE                 ACCOUNT NO.:  [id number]  MEDICAL RECORD NO.:  [id number]  LOCATION:  [facility]                         FACILITY:  [facility]  PHYSICIAN:  Gaynelle Arabian, M.D.    DATE OF BIRTH:  February 05, 1961  DATE OF PROCEDURE:  08/26/2017 DATE OF DISCHARGE:                              OPERATIVE REPORT   PREOPERATIVE DIAGNOSIS:  Right knee recurrent effusions and prepatellar bursitis.  POSTOPERATIVE DIAGNOSIS:  Right knee recurrent effusions and prepatellar bursitis.  PROCEDURE:  Right knee irrigation, debridement and polyethylene exchange.  SURGEON:  Gaynelle Arabian, M.D.  ASSISTANT:  Griffith Citron, PA student.  ANESTHESIA:  General.  ESTIMATED BLOOD LOSS:  25 mL.  DRAINS:  Hemovac x1.  TOURNIQUET TIME:  35 minutes at 300 mmHg.  COMPLICATIONS:  None.  CONDITION:  Stable to recovery.  BRIEF CLINICAL NOTE:  Major is a 56 year old male with a long very complex history in regard to his right knee.  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 He has had a recurrent bursal effusion.  He had a bursectomy, did not eliminate the issue.  It was felt now that the fluid was potentially communicating with the joint.  He presents now for irrigation, debridement and polyethylene exchange.  PROCEDURE IN DETAIL:  After successful administration of general anesthetic, a tourniquet was placed high on the right thigh and right lower extremity prepped and draped in the usual sterile fashion. Extremity was wrapped with an Esmarch and tourniquet inflated to 300 mmHg.  Midline incision was made through the skin with a 10-blade to the subcutaneous tissue.  I cut into the bursal sac and there was a lot of fibrinous debris in there as well as some fluid.  The fluid was sent for Gram stain, culture and sensitivity.  I removed the bursal sac with rongeur and electrocautery.  I then used a fresh knife to make a medial parapatellar  arthrotomy.  There was a tiny amount of fluid in the joint and a tiny amount of fibrinous debris.  The joint otherwise looked normal.  I did a thorough synovectomy and then removed the polyethylene and did a posterior synovectomy.  We then thoroughly irrigated the joint with 6 liters of saline using pulsatile lavage.  A new polyethylene was then placed, this was a 30-mm TC3 rotating platform for a size 4 femur. The knee was reduced with excellent stability.  Further irrigation was performed with pulsatile lavage.  We then closed the arthrotomy over a Hemovac drain with a running #1 Stratafix suture.  Flexion against gravity was about 115 degrees.  I then debrided the subcu tissue of the bursal layer.  I removed that bursal layer to get to normal-appearing tissue.  We then irrigated with another 2 liters of saline with pulsatile lavage.  The tourniquet was released for a time of 35 minutes. Minor bleeding was stopped with cautery.  Subcu was closed over a second Hemovac drain with interrupted 2-0 Vicryl and then skin closed with staples.  Incisions cleaned and dried and a bulky sterile dressing applied.  He was placed into a knee immobilizer, awakened and transported to recovery  in stable condition.     Gaynelle Arabian, M.D.     FA/MEDQ  D:  08/26/2017  T:  08/27/2017  Job:  [id number]

## 2017-08-27 NOTE — Evaluation (Signed)
Physical Therapy Evaluation Patient Details Name: [name] MRN: [id number] DOB: 1961-06-28 Today's Date: 08/27/2017   History of Present Illness  s/p  I and D with polyexchange R knee; Hx: multiple knee surgeries  Clinical Impression  Pt is s/p knee I and D with poly exchange resulting in the deficits listed below (see PT Problem List).  Pt will benefit from skilled PT to increase their independence and safety with mobility to allow discharge to the venue listed below.      Follow Up Recommendations DC plan and follow up therapy as arranged by surgeon    Equipment Recommendations  None recommended by PT    Recommendations for Other Services       Precautions / Restrictions Precautions Precautions: Fall;Knee Restrictions Other Position/Activity Restrictions: WBAT      Mobility  Bed Mobility Overal bed mobility: Needs Assistance Bed Mobility: Supine to Sit     Supine to sit: Min guard     General bed mobility comments: min/guard for RLE   Transfers Overall transfer level: Needs assistance Equipment used: Rolling walker (2 wheeled) Transfers: Sit to/from Stand Sit to Stand: Min guard         General transfer comment: for safety  Ambulation/Gait Ambulation/Gait assistance: Min guard;Supervision Ambulation Distance (Feet): 140 Feet Assistive device: Rolling walker (2 wheeled) Gait Pattern/deviations: Step-to pattern;Step-through pattern;Decreased stride length     General Gait Details: cues for sequence and RW position  Stairs            Wheelchair Mobility    Modified Rankin (Stroke Patients Only)       Balance                                             Pertinent Vitals/Pain Pain Assessment: 0-10 Pain Score: 3  Pain Location: right knee Pain Descriptors / Indicators: Sore Pain Intervention(s): Limited activity within patient's tolerance;Monitored during session    Home Living Family/patient expects to be  discharged to:: Private residence Living Arrangements: Spouse/significant other Available Help at Discharge: Family Type of Home: House Home Access: Stairs to enter Entrance Stairs-Rails: Right Entrance Stairs-Number of Steps: 4-front, 3 back Home Layout: One level Home Equipment: Walker - 2 wheels;Cane - single point;Crutches;Bedside commode;Shower seat;Wheelchair - manual      Prior Function Level of Independence: Independent               Hand Dominance        Extremity/Trunk Assessment   Upper Extremity Assessment Upper Extremity Assessment: Defer to OT evaluation    Lower Extremity Assessment Lower Extremity Assessment: RLE deficits/detail RLE Deficits / Details: ankle WFL; knee flexion limited by post op pain; knee extension and hip flexion 2+/5       Communication   Communication: No difficulties  Cognition Arousal/Alertness: Awake/alert Behavior During Therapy: WFL for tasks assessed/performed Overall Cognitive Status: Within Functional Limits for tasks assessed                                        General Comments      Exercises     Assessment/Plan    PT Assessment Patient needs continued PT services  PT Problem List Decreased activity tolerance;Decreased range of motion;Decreased mobility;Pain       PT Treatment Interventions DME  instruction;Gait training;Functional mobility training;Therapeutic activities;Therapeutic exercise;Patient/family education;Stair training    PT Goals (Current goals can be found in the Care Plan section)  Acute Rehab PT Goals Patient Stated Goal: home today PT Goal Formulation: With patient Time For Goal Achievement: 08/30/17 Potential to Achieve Goals: Good    Frequency 7X/week   Barriers to discharge        Co-evaluation               AM-PAC PT "6 Clicks" Daily Activity  Outcome Measure Difficulty turning over in bed (including adjusting bedclothes, sheets and blankets)?:  Unable Difficulty moving from lying on back to sitting on the side of the bed? : Unable Difficulty sitting down on and standing up from a chair with arms (e.g., wheelchair, bedside commode, etc,.)?: A Little Help needed moving to and from a bed to chair (including a wheelchair)?: A Little Help needed walking in hospital room?: A Little Help needed climbing 3-5 steps with a railing? : A Little 6 Click Score: 14    End of Session Equipment Utilized During Treatment: Gait belt;Right knee immobilizer Activity Tolerance: Patient tolerated treatment well Patient left: in chair;with call bell/phone within reach;with family/visitor present Nurse Communication: Mobility status PT Visit Diagnosis: Difficulty in walking, not elsewhere classified (R26.2)    Time: 9147-8295 PT Time Calculation (min) (ACUTE ONLY): 24 min   Charges:     PT Treatments $Gait Training: 8-22 mins   PT G Codes:   PT G-Codes **NOT FOR INPATIENT CLASS** Functional Assessment Tool Used: AM-PAC 6 Clicks Basic Mobility;Clinical judgement Functional Limitation: Mobility: Walking and moving around Mobility: Walking and Moving Around Current Status (A2130): At least 1 percent but less than 20 percent impaired, limited or restricted Mobility: Walking and Moving Around Goal Status [phone]): At least 1 percent but less than 20 percent impaired, limited or restricted    Kenyon Ana, PT Pager: [phone] 08/27/2017   [facility] 08/27/2017, 11:10 AM

## 2017-08-27 NOTE — Discharge Instructions (Signed)
° °Dr. Frank Aluisio °Total Joint Specialist °[facility] °[address] °[place], [place] [address] °[phone] ° °TOTAL KNEE REPLACEMENT POSTOPERATIVE DIRECTIONS ° °Knee Rehabilitation, Guidelines Following Surgery  °Results after knee surgery are often greatly improved when you follow the exercise, range of motion and muscle strengthening exercises prescribed by your doctor. Safety measures are also important to protect the knee from further injury. Any time any of these exercises cause you to have increased pain or swelling in your knee joint, decrease the amount until you are comfortable again and slowly increase them. If you have problems or questions, call your caregiver or physical therapist for advice.  ° °HOME CARE INSTRUCTIONS  °Remove items at home which could result in a fall. This includes throw rugs or furniture in walking pathways.  °· ICE to the affected knee every three hours for 30 minutes at a time and then as needed for pain and swelling.  Continue to use ice on the knee for pain and swelling from surgery. You may notice swelling that will progress down to the foot and ankle.  This is normal after surgery.  Elevate the leg when you are not up walking on it.   °· Continue to use the breathing machine which will help keep your temperature down.  It is common for your temperature to cycle up and down following surgery, especially at night when you are not up moving around and exerting yourself.  The breathing machine keeps your lungs expanded and your temperature down. °· Do not place pillow under knee, focus on keeping the knee straight while resting ° °DIET °You may resume your previous home diet once your are discharged from the hospital. ° °DRESSING / WOUND CARE / SHOWERING °You may shower 3 days after surgery, but keep the wounds dry during showering.  You may use an occlusive plastic wrap (Press'n Seal for example), NO SOAKING/SUBMERGING IN THE BATHTUB.  If the  bandage gets wet, change with a clean dry gauze.  If the incision gets wet, pat the wound dry with a clean towel. °You may start showering once you are discharged home but do not submerge the incision under water. Just pat the incision dry and apply a dry gauze dressing on daily. °Change the surgical dressing daily and reapply a dry dressing each time. ° °ACTIVITY °Walk with your walker as instructed. °Use walker as long as suggested by your caregivers. °Avoid periods of inactivity such as sitting longer than an hour when not asleep. This helps prevent blood clots.  °You may resume a sexual relationship in one month or when given the OK by your doctor.  °You may return to work once you are cleared by your doctor.  °Do not drive a car for 6 weeks or until released by you surgeon.  °Do not drive while taking narcotics. ° °WEIGHT BEARING °Weight bearing as tolerated with assist device (walker, cane, etc) as directed, use it as long as suggested by your surgeon or therapist, typically at least 4-6 weeks. ° °POSTOPERATIVE CONSTIPATION PROTOCOL °Constipation - defined medically as fewer than three stools per week and severe constipation as less than one stool per week. ° °One of the most common issues patients have following surgery is constipation.  Even if you have a regular bowel pattern at home, your normal regimen is likely to be disrupted due to multiple reasons following surgery.  Combination of anesthesia, postoperative narcotics, change in appetite and fluid intake all can affect your bowels.    In order to avoid complications following surgery, here are some recommendations in order to help you during your recovery period. ° °Colace (docusate) - Pick up an over-the-counter form of Colace or another stool softener and take twice a day as long as you are requiring postoperative pain medications.  Take with a full glass of water daily.  If you experience loose stools or diarrhea, hold the colace until you stool forms  back up.  If your symptoms do not get better within 1 week or if they get worse, check with your doctor. ° °Dulcolax (bisacodyl) - Pick up over-the-counter and take as directed by the product packaging as needed to assist with the movement of your bowels.  Take with a full glass of water.  Use this product as needed if not relieved by Colace only.  ° °MiraLax (polyethylene glycol) - Pick up over-the-counter to have on hand.  MiraLax is a solution that will increase the amount of water in your bowels to assist with bowel movements.  Take as directed and can mix with a glass of water, juice, soda, coffee, or tea.  Take if you go more than two days without a movement. °Do not use MiraLax more than once per day. Call your doctor if you are still constipated or irregular after using this medication for 7 days in a row. ° °If you continue to have problems with postoperative constipation, please contact the office for further assistance and recommendations.  If you experience "the worst abdominal pain ever" or develop nausea or vomiting, please contact the office immediatly for further recommendations for treatment. ° °ITCHING ° If you experience itching with your medications, try taking only a single pain pill, or even half a pain pill at a time.  You can also use Benadryl over the counter for itching or also to help with sleep.  ° °TED HOSE STOCKINGS °Wear the elastic stockings on both legs for three weeks following surgery during the day but you may remove then at night for sleeping. ° °MEDICATIONS °See your medication summary on the “After Visit Summary” that the nursing staff will review with you prior to discharge.  You may have some home medications which will be placed on hold until you complete the course of blood thinner medication.  It is important for you to complete the blood thinner medication as prescribed by your surgeon.  Continue your approved medications as instructed at time of  discharge. ° °PRECAUTIONS °If you experience chest pain or shortness of breath - call 911 immediately for transfer to the hospital emergency department.  °If you develop a fever greater that 101 F, purulent drainage from wound, increased redness or drainage from wound, foul odor from the wound/dressing, or calf pain - CONTACT YOUR SURGEON.   °                                                °FOLLOW-UP APPOINTMENTS °Make sure you keep all of your appointments after your operation with your surgeon and caregivers. You should call the office at the above phone number and make an appointment for approximately two weeks after the date of your surgery or on the date instructed by your surgeon outlined in the "After Visit Summary". ° ° °RANGE OF MOTION AND STRENGTHENING EXERCISES  °Rehabilitation of the knee is important following a knee injury or   an operation. After just a few days of immobilization, the muscles of the thigh which control the knee become weakened and shrink (atrophy). Knee exercises are designed to build up the tone and strength of the thigh muscles and to improve knee motion. Often times heat used for twenty to thirty minutes before working out will loosen up your tissues and help with improving the range of motion but do not use heat for the first two weeks following surgery. These exercises can be done on a training (exercise) mat, on the floor, on a table or on a bed. Use what ever works the best and is most comfortable for you Knee exercises include:  °Leg Lifts - While your knee is still immobilized in a splint or cast, you can do straight leg raises. Lift the leg to 60 degrees, hold for 3 sec, and slowly lower the leg. Repeat 10-20 times 2-3 times daily. Perform this exercise against resistance later as your knee gets better.  °Quad and Hamstring Sets - Tighten up the muscle on the front of the thigh (Quad) and hold for 5-10 sec. Repeat this 10-20 times hourly. Hamstring sets are done by pushing the  foot backward against an object and holding for 5-10 sec. Repeat as with quad sets.  °· Leg Slides: Lying on your back, slowly slide your foot toward your buttocks, bending your knee up off the floor (only go as far as is comfortable). Then slowly slide your foot back down until your leg is flat on the floor again. °· Angel Wings: Lying on your back spread your legs to the side as far apart as you can without causing discomfort.  °A rehabilitation program following serious knee injuries can speed recovery and prevent re-injury in the future due to weakened muscles. Contact your doctor or a physical therapist for more information on knee rehabilitation.  ° °IF YOU ARE TRANSFERRED TO A SKILLED REHAB FACILITY °If the patient is transferred to a skilled rehab facility following release from the hospital, a list of the current medications will be sent to the facility for the patient to continue.  When discharged from the skilled rehab facility, please have the facility set up the patient's Home Health Physical Therapy prior to being released. Also, the skilled facility will be responsible for providing the patient with their medications at time of release from the facility to include their pain medication, the muscle relaxants, and their blood thinner medication. If the patient is still at the rehab facility at time of the two week follow up appointment, the skilled rehab facility will also need to assist the patient in arranging follow up appointment in our office and any transportation needs. ° °MAKE SURE YOU:  °Understand these instructions.  °Get help right away if you are not doing well or get worse.  ° ° °Pick up stool softner and laxative for home use following surgery while on pain medications. °Do not submerge incision under water. °Please use good hand washing techniques while changing dressing each day. °May shower starting three days after surgery. °Please use a clean towel to pat the incision dry following  showers. °Continue to use ice for pain and swelling after surgery. °Do not use any lotions or creams on the incision until instructed by your surgeon. ° °

## 2017-08-28 NOTE — Progress Notes (Signed)
   08/27/17 1203  OT Time Calculation  OT Start Time (ACUTE ONLY) 1125  OT Stop Time (ACUTE ONLY) 1144  OT Time Calculation (min) 19 min  OT G-codes **NOT FOR INPATIENT CLASS**  Functional Assessment Tool Used AM-PAC 6 Clicks Daily Activity  Functional Limitation Self care  Self Care Current Status (P2244) CK  Self Care Goal Status (L7530) CI  OT General Charges  $OT Visit 1 Visit  OT Evaluation  $OT Eval Low Complexity 1 Low  Sanita Estrada, OTR/L for [employer], [place], based on her note/AM-PAC scores [phone] 08/28/2017

## 2017-08-31 LAB — AEROBIC/ANAEROBIC CULTURE (SURGICAL/DEEP WOUND): CULTURE: NO GROWTH

## 2017-08-31 LAB — AEROBIC/ANAEROBIC CULTURE W GRAM STAIN (SURGICAL/DEEP WOUND)

## 2019-11-10 NOTE — Patient Instructions (Signed)
DUE TO COVID-19 ONLY ONE VISITOR IS ALLOWED TO COME WITH YOU AND STAY IN THE WAITING ROOM ONLY DURING PRE OP AND PROCEDURE DAY OF SURGERY. THE 1 VISITOR MAY VISIT WITH YOU AFTER SURGERY IN YOUR PRIVATE ROOM DURING VISITING HOURS ONLY!  YOU NEED TO HAVE A COVID 19 TEST ON_______ @_______ , THIS TEST MUST BE DONE BEFORE SURGERY, COME  [address], [place] , .  [facility]) ONCE YOUR COVID TEST IS COMPLETED, PLEASE BEGIN THE QUARANTINE INSTRUCTIONS AS OUTLINED IN YOUR HANDOUT.                [facility]    Your procedure is scheduled on: 11/16/19   Report to [facility] Main  Entrance   Report to admitting at 7:45 AM     Call this number if you have problems the morning of surgery [phone]     BRUSH YOUR TEETH MORNING OF SURGERY AND RINSE YOUR MOUTH OUT, NO CHEWING GUM CANDY OR MINTS.    Do not eat food After Midnight.   YOU MAY HAVE CLEAR LIQUIDS FROM MIDNIGHT UNTIL 6:30 AM.   At 6:30 AM Please finish the prescribed Pre-Surgery  drink  . Nothing by mouth after you finish the  drink !   Take these medicines the morning of surgery with A SIP OF WATER: Metoprolol, Cardizem, Rabeprazole. Use your inhaler and bring it with you to the hospital.  Bring your mask and tubing to the hospital                                 You may not have any metal on your body including              piercings  Do not wear jewelry,  lotions, powders or  deodorant                    Men may shave face and neck.   Do not bring valuables to the hospital. [facility] IS NOT             RESPONSIBLE   FOR VALUABLES.  Contacts, dentures or bridgework may not be worn into surgery.                   Please read over the following fact sheets you were given: _____________________________________________________________________             [facility] - Preparing for Surgery  Before surgery, you can play an important role .  Because skin is not sterile, your  skin needs to be as free of germs as possible.   You can reduce the number of germs on your skin by washing with CHG (chlorahexidine gluconate) soap before surgery.   CHG is an antiseptic cleaner which kills germs and bonds with the skin to continue killing germs even after washing. Please DO NOT use if you have an allergy to CHG or antibacterial soaps.   If your skin becomes reddened/irritated stop using the CHG and inform your nurse when you arrive at Short Stay. You may shave your face/neck.  Please follow these instructions carefully:  1.  Shower with CHG Soap the night before surgery and the  morning of Surgery.  2.  If you choose to wash your hair, wash your hair first as usual with your  normal  shampoo.  3.  After you shampoo, rinse your  hair and body thoroughly to remove the  shampoo.                                        4.  Use CHG as you would any other liquid soap.  You can apply chg directly  to the skin and wash                       Gently with a scrungie or clean washcloth.  5.  Apply the CHG Soap to your body ONLY FROM THE NECK DOWN.   Do not use on face/ open                           Wound or open sores. Avoid contact with eyes, ears mouth and genitals (private parts).                       Wash face,  Genitals (private parts) with your normal soap.             6.  Wash thoroughly, paying special attention to the area where your surgery  will be performed.  7.  Thoroughly rinse your body with warm water from the neck down.  8.  DO NOT shower/wash with your normal soap after using and rinsing off  the CHG Soap.             9.  Pat yourself dry with a clean towel.            10.  Wear clean pajamas.            11.  Place clean sheets on your bed the night of your first shower and do not  sleep with pets. Day of Surgery : Do not apply any lotions/deodorants the morning of surgery.  Please wear clean clothes to the hospital/surgery center.  FAILURE TO FOLLOW THESE  INSTRUCTIONS MAY RESULT IN THE CANCELLATION OF YOUR SURGERY PATIENT SIGNATURE_________________________________  NURSE SIGNATURE__________________________________  ________________________________________________________________________   [name]  An incentive spirometer is a tool that can help keep your lungs clear and active. This tool measures how well you are filling your lungs with each breath. Taking long deep breaths may help reverse or decrease the chance of developing breathing (pulmonary) problems (especially infection) following:  A long period of time when you are unable to move or be active. BEFORE THE PROCEDURE   If the spirometer includes an indicator to show your best effort, your nurse or respiratory therapist will set it to a desired goal.  If possible, sit up straight or lean slightly forward. Try not to slouch.  Hold the incentive spirometer in an upright position. INSTRUCTIONS FOR USE  1. Sit on the edge of your bed if possible, or sit up as far as you can in bed or on a chair. 2. Hold the incentive spirometer in an upright position. 3. Breathe out normally. 4. Place the mouthpiece in your mouth and seal your lips tightly around it. 5. Breathe in slowly and as deeply as possible, raising the piston or the ball toward the top of the column. 6. Hold your breath for 3-5 seconds or for as long as possible. Allow the piston or ball to fall to the bottom of the column. 7. Remove the mouthpiece from your mouth and breathe out  normally. 8. Rest for a few seconds and repeat Steps 1 through 7 at least 10 times every 1-2 hours when you are awake. Take your time and take a few normal breaths between deep breaths. 9. The spirometer may include an indicator to show your best effort. Use the indicator as a goal to work toward during each repetition. 10. After each set of 10 deep breaths, practice coughing to be sure your lungs are clear. If you have an incision (the  cut made at the time of surgery), support your incision when coughing by placing a pillow or rolled up towels firmly against it. Once you are able to get out of bed, walk around indoors and cough well. You may stop using the incentive spirometer when instructed by your caregiver.  RISKS AND COMPLICATIONS  Take your time so you do not get dizzy or light-headed.  If you are in pain, you may need to take or ask for pain medication before doing incentive spirometry. It is harder to take a deep breath if you are having pain. AFTER USE  Rest and breathe slowly and easily.  It can be helpful to keep track of a log of your progress. Your caregiver can provide you with a simple table to help with this. If you are using the spirometer at home, follow these instructions: [place] IF:   You are having difficultly using the spirometer.  You have trouble using the spirometer as often as instructed.  Your pain medication is not giving enough relief while using the spirometer.  You develop fever of 100.5 F (38.1 C) or higher. SEEK IMMEDIATE MEDICAL CARE IF:   You cough up bloody sputum that had not been present before.  You develop fever of 102 F (38.9 C) or greater.  You develop worsening pain at or near the incision site. MAKE SURE YOU:   Understand these instructions.  Will watch your condition.  Will get help right away if you are not doing well or get worse. Document Released: 02/11/2007 Document Revised: 12/24/2011 Document Reviewed: 04/14/2007 ExitCare Patient Information 2014 ExitCare, [place].   ________________________________________________________________________  WHAT IS A BLOOD TRANSFUSION? Blood Transfusion Information  A transfusion is the replacement of blood or some of its parts. Blood is made up of multiple cells which provide different functions.  Red blood cells carry oxygen and are used for blood loss replacement.  White blood cells fight against  infection.  Platelets control bleeding.  Plasma helps clot blood.  Other blood products are available for specialized needs, such as hemophilia or other clotting disorders. BEFORE THE TRANSFUSION  Who gives blood for transfusions?   Healthy volunteers who are fully evaluated to make sure their blood is safe. This is blood bank blood. Transfusion therapy is the safest it has ever been in the practice of medicine. Before blood is taken from a donor, a complete history is taken to make sure that person has no history of diseases nor engages in risky social behavior (examples are intravenous drug use or sexual activity with multiple partners). The donor's travel history is screened to minimize risk of transmitting infections, such as malaria. The donated blood is tested for signs of infectious diseases, such as HIV and hepatitis. The blood is then tested to be sure it is compatible with you in order to minimize the chance of a transfusion reaction. If you or a relative donates blood, this is often done in anticipation of surgery and is not appropriate for emergency situations.  It takes many days to process the donated blood. RISKS AND COMPLICATIONS Although transfusion therapy is very safe and saves many lives, the main dangers of transfusion include:   Getting an infectious disease.  Developing a transfusion reaction. This is an allergic reaction to something in the blood you were given. Every precaution is taken to prevent this. The decision to have a blood transfusion has been considered carefully by your caregiver before blood is given. Blood is not given unless the benefits outweigh the risks. AFTER THE TRANSFUSION  Right after receiving a blood transfusion, you will usually feel much better and more energetic. This is especially true if your red blood cells have gotten low (anemic). The transfusion raises the level of the red blood cells which carry oxygen, and this usually causes an energy  increase.  The nurse administering the transfusion will monitor you carefully for complications. HOME CARE INSTRUCTIONS  No special instructions are needed after a transfusion. You may find your energy is better. Speak with your caregiver about any limitations on activity for underlying diseases you may have. SEEK MEDICAL CARE IF:   Your condition is not improving after your transfusion.  You develop redness or irritation at the intravenous (IV) site. SEEK IMMEDIATE MEDICAL CARE IF:  Any of the following symptoms occur over the next 12 hours:  Shaking chills.  You have a temperature by mouth above 102 F (38.9 C), not controlled by medicine.  Chest, back, or muscle pain.  People around you feel you are not acting correctly or are confused.  Shortness of breath or difficulty breathing.  Dizziness and fainting.  You get a rash or develop hives.  You have a decrease in urine output.  Your urine turns a dark color or changes to pink, red, or brown. Any of the following symptoms occur over the next 10 days:  You have a temperature by mouth above 102 F (38.9 C), not controlled by medicine.  Shortness of breath.  Weakness after normal activity.  The white part of the eye turns yellow (jaundice).  You have a decrease in the amount of urine or are urinating less often.  Your urine turns a dark color or changes to pink, red, or brown. Document Released: 09/28/2000 Document Revised: 12/24/2011 Document Reviewed: 05/17/2008 [facility] Patient Information 2014 [place], [place].  _______________________________________________________________________

## 2019-11-11 ENCOUNTER — Encounter (HOSPITAL_COMMUNITY)
Admission: RE | Admit: 2019-11-11 | Discharge: 2019-11-11 | Disposition: A | Discharge status: Home / Self Care | Payer: 59 | Source: Ambulatory Visit | Attending: Orthopedic Surgery | Admitting: Orthopedic Surgery

## 2019-11-11 ENCOUNTER — Encounter (HOSPITAL_COMMUNITY): Payer: Self-pay

## 2019-11-11 ENCOUNTER — Other Ambulatory Visit: Payer: Self-pay

## 2019-11-11 DIAGNOSIS — I10 Essential (primary) hypertension: Secondary | ICD-10-CM | POA: Insufficient documentation

## 2019-11-11 DIAGNOSIS — I440 Atrioventricular block, first degree: Secondary | ICD-10-CM | POA: Insufficient documentation

## 2019-11-11 DIAGNOSIS — Z01818 Encounter for other preprocedural examination: Secondary | ICD-10-CM | POA: Diagnosis not present

## 2019-11-11 DIAGNOSIS — M00861 Arthritis due to other bacteria, right knee: Secondary | ICD-10-CM | POA: Diagnosis not present

## 2019-11-11 NOTE — Progress Notes (Signed)
PCP - Dr.D. [facility] Cardiologist -no   Chest x-ray - 2017 EKG - 11/12/19 Stress Test - no ECHO - no Cardiac Cath - no  Sleep Study - yes CPAP - no. Has not used in a long time  Fasting Blood Sugar - NA Checks Blood Sugar _____ times a day  Blood Thinner Instructions:NA Aspirin Instructions: Last Dose:  Anesthesia review:   Patient denies shortness of breath, fever, cough and chest pain at PAT appointment yes  Patient verbalized understanding of instructions that were given to them at the PAT appointment. Patient was also instructed that they will need to review over the PAT instructions again at home before surgery. yes

## 2019-11-12 ENCOUNTER — Other Ambulatory Visit
Admission: RE | Admit: 2019-11-12 | Discharge: 2019-11-12 | Disposition: A | Discharge status: Home / Self Care | Payer: 59 | Source: Ambulatory Visit | Attending: Orthopedic Surgery | Admitting: Orthopedic Surgery

## 2019-11-12 ENCOUNTER — Other Ambulatory Visit (HOSPITAL_COMMUNITY): Payer: 59

## 2019-11-12 DIAGNOSIS — Z20822 Contact with and (suspected) exposure to covid-19: Secondary | ICD-10-CM | POA: Diagnosis not present

## 2019-11-12 DIAGNOSIS — Z01812 Encounter for preprocedural laboratory examination: Secondary | ICD-10-CM | POA: Diagnosis not present

## 2019-11-12 LAB — SARS CORONAVIRUS 2 (TAT 6-24 HRS): SARS Coronavirus 2: NEGATIVE

## 2019-11-13 ENCOUNTER — Encounter (HOSPITAL_COMMUNITY)
Admission: RE | Admit: 2019-11-13 | Discharge: 2019-11-13 | Disposition: A | Discharge status: Home / Self Care | Payer: 59 | Source: Ambulatory Visit | Attending: Orthopedic Surgery | Admitting: Orthopedic Surgery

## 2019-11-13 ENCOUNTER — Other Ambulatory Visit: Payer: Self-pay

## 2019-11-13 ENCOUNTER — Encounter (HOSPITAL_COMMUNITY): Payer: 59

## 2019-11-13 DIAGNOSIS — Z01818 Encounter for other preprocedural examination: Secondary | ICD-10-CM | POA: Diagnosis not present

## 2019-11-13 LAB — COMPREHENSIVE METABOLIC PANEL
ALT: 16 U/L (ref 0–44)
AST: 20 U/L (ref 15–41)
Albumin: 3.6 g/dL (ref 3.5–5.0)
Alkaline Phosphatase: 59 U/L (ref 38–126)
Anion gap: 8 (ref 5–15)
BUN: 24 mg/dL — ABNORMAL HIGH (ref 6–20)
CO2: 25 mmol/L (ref 22–32)
Calcium: 9 mg/dL (ref 8.9–10.3)
Chloride: 103 mmol/L (ref 98–111)
Creatinine, Ser: 1.09 mg/dL (ref 0.61–1.24)
GFR calc Af Amer: >60 mL/min (ref >60)
GFR calc non Af Amer: >60 mL/min (ref >60)
Glucose, Bld: 120 mg/dL — ABNORMAL HIGH (ref 70–99)
Potassium: 4.4 mmol/L (ref 3.5–5.1)
Sodium: 136 mmol/L (ref 135–145)
Total Bilirubin: 0.8 mg/dL (ref 0.3–1.2)
Total Protein: 7.8 g/dL (ref 6.5–8.1)

## 2019-11-13 LAB — CBC
HCT: 41.9 % (ref 39.0–52.0)
Hemoglobin: 12.9 g/dL — ABNORMAL LOW (ref 13.0–17.0)
MCH: 26.7 pg (ref 26.0–34.0)
MCHC: 30.8 g/dL (ref 30.0–36.0)
MCV: 86.7 fL (ref 80.0–100.0)
Platelets: 364 10*3/uL (ref 150–400)
RBC: 4.83 MIL/uL (ref 4.22–5.81)
RDW: 16.8 % — ABNORMAL HIGH (ref 11.5–15.5)
WBC: 8.4 10*3/uL (ref 4.0–10.5)
nRBC: 0 % (ref 0.0–0.2)

## 2019-11-13 LAB — SURGICAL PCR SCREEN
MRSA, PCR: NEGATIVE
Staphylococcus aureus: NEGATIVE

## 2019-11-13 LAB — PROTIME-INR
INR: 1.1 (ref 0.8–1.2)
Prothrombin Time: 13.6 seconds (ref 11.4–15.2)

## 2019-11-13 LAB — APTT: aPTT: 27 seconds (ref 24–36)

## 2019-11-15 MED ORDER — BUPIVACAINE LIPOSOME 1.3 % IJ SUSP
20.0000 mL | Freq: Once | INTRAMUSCULAR | Status: DC
  Filled 2019-11-15: qty 20

## 2019-11-15 NOTE — H&P (Signed)
[name] is an 59 y.o. male.   Chief Complaint: Right knee recurrent pain and swelling HPI: 59 yo male s/p revision right TKA and history of low grade periprosthetic infection who was asymptomatic for over a year but recently developed increased pain and swelling in his right knee. He has had aspirations which were negative on culture but had an elevated WBC. Given his increased and recurrent swelling we decided to do an irrigation and debridement with poly exchange.  Past Medical History:  Diagnosis Date  . Anemia    hx of  . Asthma   . Cellulitis of lower extremity    "usually RLL; this time it's got up to my lower abdoment" (02/11/2014)-series of antibiotics completed 02-28-14  . Diverticulosis   . Hypertension   . Infection of right knee (HCC)   . OSA (obstructive sleep apnea)    "quit wearing mask several years ago" (02/11/2014)  . Pneumonia   . PONV (postoperative nausea and vomiting)   . Rheumatoid arthritis (HCC) dx'd ~ 1977  . S/P PICC central line placement 03-16-14   right upper arm remains intact.05-11-14 removed 1 week ago.    Past Surgical History:  Procedure Laterality Date  . EXCISIONAL HEMORRHOIDECTOMY    . EXCISIONAL TOTAL KNEE ARTHROPLASTY WITH ANTIBIOTIC SPACERS Right 03/18/2014   Procedure: RIGHT KNEE RESECTION ARTHROPLASTY WITH ANTIBIOTIC SPACERS;  Surgeon: Loanne Drilling, MD;  Location: [facility];  Service: Orthopedics;  Laterality: Right;  . FOREIGN BODY REMOVAL Left    "BB" removal above left eye-teen yrs.  . I & D KNEE WITH POLY EXCHANGE Right 04/25/2016   Procedure: RIGHT KNEE IRRIGATION AND DEBRIDEMENT WITH POLY EXCHANGE;  Surgeon: Ollen Gross, MD;  Location: [facility];  Service: Orthopedics;  Laterality: Right;  . I & D KNEE WITH POLY EXCHANGE Right 10/01/2016   Procedure: IRRIGATION AND DEBRIDEMENT KNEE WITH POLY EXCHANGE;  Surgeon: Ollen Gross, MD;  Location: [facility];  Service: Orthopedics;  Laterality: Right;  . I & D KNEE WITH POLY EXCHANGE Right  08/26/2017   Procedure: IRRIGATION AND DEBRIDEMENT KNEE WITH POLY EXCHANGE;  Surgeon: Ollen Gross, MD;  Location: [facility];  Service: Orthopedics;  Laterality: Right;  . INCISION AND DRAINAGE Right 09/28/2014   Procedure: INCISION AND DRAINAGE RIGHT KNEE;  Surgeon: Loanne Drilling, MD;  Location: [facility];  Service: Orthopedics;  Laterality: Right;  . KNEE BURSECTOMY Right 03/13/2017   Procedure: RIGHT KNEE PREPATELLAR BURSECTOMY;  Surgeon: Ollen Gross, MD;  Location: [facility];  Service: Orthopedics;  Laterality: Right;  . picc line placement Right    right upper arm  . 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 REVISION TOTAL KNEE ARTHROPLASTY Right 2012  . SPIGELIAN HERNIA Right 12/04/2015   Procedure: INCARCERATED SPIGELIAN HERNIA REPAIR WITH MESH;  Surgeon: Violeta Gelinas, MD;  Location: [facility];  Service: General;  Laterality: Right;  . TOTAL KNEE ARTHROPLASTY Right 05/21/2014   Procedure: RIGHT KNEE ARTHROPLASTY REINPLANTATION;  Surgeon: Loanne Drilling, MD;  Location: [facility];  Service: Orthopedics;  Laterality: Right;    Family History  Problem Relation Age of Onset  . Colon cancer Mother   . Hypertension Mother   . Diabetes Mellitus II Paternal Grandmother    Social History:  reports that he quit smoking about 38 years ago. His smoking use included cigarettes. He has a 3.00 pack-year smoking history. His  smokeless tobacco use includes snuff. He reports that he does not drink alcohol or use drugs.  Allergies:  Allergies  Allergen Reactions  . Avelox [Moxifloxacin Hcl In Nacl] Hives, Shortness Of Breath and Itching  . Doxycycline Itching  . Rocephin [Ceftriaxone Sodium In Dextrose] Hives and Itching  . Sulfa Antibiotics Itching  . Vancomycin Hives and Itching  . Erythrocin Rash  . Sulfasalazine Rash    No medications prior to admission.    No results  found for this or any previous visit (from the past 48 hour(s)). No results found.  Review of Systems  There were no vitals taken for this visit. 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 Discussed in detail with patient who elects to proceed. We discussed this procedure as opposed to a 2 stage revision and since he has had multiple revisions it was agreed that we would pursue this option in an attempt to salvage the prosthesis  Gaynelle Arabian, MD 11/15/2019, 5:01 PM

## 2019-11-15 NOTE — Anesthesia Preprocedure Evaluation (Addendum)
Anesthesia Evaluation  Patient identified by MRN, date of birth, ID band Patient awake    Reviewed: Allergy & Precautions, NPO status , Patient's Chart, lab work & pertinent test results  History of Anesthesia Complications (+) PONV and history of anesthetic complications  Airway Mallampati: III  TM Distance: >3 FB Neck ROM: Full    Dental no notable dental hx. (+) Dental Advisory Given   Pulmonary asthma , sleep apnea , former smoker,    Pulmonary exam normal        Cardiovascular hypertension, Pt. on medications and Pt. on home beta blockers negative cardio ROS Normal cardiovascular exam     Neuro/Psych negative neurological ROS  negative psych ROS   GI/Hepatic negative GI ROS, Neg liver ROS,   Endo/Other  Morbid obesity  Renal/GU negative Renal ROS     Musculoskeletal negative musculoskeletal ROS (+)   Abdominal   Peds  Hematology negative hematology ROS (+)   Anesthesia Other Findings Day of surgery medications reviewed with the patient.  Reproductive/Obstetrics                            Anesthesia Physical Anesthesia Plan  ASA: III  Anesthesia Plan: General   Post-op Pain Management:    Induction: Intravenous  PONV Risk Score and Plan: 3 and Ondansetron, Dexamethasone, Scopolamine patch - Pre-op and Midazolam  Airway Management Planned: LMA and Oral ETT  Additional Equipment:   Intra-op Plan:   Post-operative Plan: Extubation in OR  Informed Consent: I have reviewed the patients History and Physical, chart, labs and discussed the procedure including the risks, benefits and alternatives for the proposed anesthesia with the patient or authorized representative who has indicated his/her understanding and acceptance.     Dental advisory given  Plan Discussed with: CRNA and Anesthesiologist  Anesthesia Plan Comments:        Anesthesia Quick Evaluation

## 2019-11-16 ENCOUNTER — Inpatient Hospital Stay (HOSPITAL_COMMUNITY): Payer: 59 | Admitting: Certified Registered Nurse Anesthetist

## 2019-11-16 ENCOUNTER — Inpatient Hospital Stay (HOSPITAL_COMMUNITY)
Admission: RE | Admit: 2019-11-16 | Discharge: 2019-11-16 | DRG: 486 | Disposition: A | Discharge status: Home / Self Care | Payer: 59 | Attending: Orthopedic Surgery | Admitting: Orthopedic Surgery

## 2019-11-16 ENCOUNTER — Encounter (HOSPITAL_COMMUNITY)
Admission: RE | Disposition: A | Discharge status: Home / Self Care | Payer: Self-pay | Source: Home / Self Care | Attending: Orthopedic Surgery

## 2019-11-16 ENCOUNTER — Encounter (HOSPITAL_COMMUNITY): Payer: Self-pay | Admitting: Orthopedic Surgery

## 2019-11-16 DIAGNOSIS — Z96653 Presence of artificial knee joint, bilateral: Secondary | ICD-10-CM | POA: Diagnosis present

## 2019-11-16 DIAGNOSIS — Y831 Surgical operation with implant of artificial internal device as the cause of abnormal reaction of the patient, or of later complication, without mention of misadventure at the time of the procedure: Secondary | ICD-10-CM | POA: Diagnosis present

## 2019-11-16 DIAGNOSIS — Z20822 Contact with and (suspected) exposure to covid-19: Secondary | ICD-10-CM | POA: Diagnosis present

## 2019-11-16 DIAGNOSIS — M009 Pyogenic arthritis, unspecified: Secondary | ICD-10-CM | POA: Diagnosis present

## 2019-11-16 DIAGNOSIS — Z87891 Personal history of nicotine dependence: Secondary | ICD-10-CM | POA: Diagnosis not present

## 2019-11-16 DIAGNOSIS — T8453XA Infection and inflammatory reaction due to internal right knee prosthesis, initial encounter: Principal | ICD-10-CM

## 2019-11-16 DIAGNOSIS — Z6841 Body Mass Index (BMI) 40.0 and over, adult: Secondary | ICD-10-CM

## 2019-11-16 DIAGNOSIS — I10 Essential (primary) hypertension: Secondary | ICD-10-CM | POA: Diagnosis present

## 2019-11-16 DIAGNOSIS — Z79899 Other long term (current) drug therapy: Secondary | ICD-10-CM | POA: Diagnosis not present

## 2019-11-16 DIAGNOSIS — G4733 Obstructive sleep apnea (adult) (pediatric): Secondary | ICD-10-CM | POA: Diagnosis present

## 2019-11-16 DIAGNOSIS — J45909 Unspecified asthma, uncomplicated: Secondary | ICD-10-CM | POA: Diagnosis present

## 2019-11-16 DIAGNOSIS — M069 Rheumatoid arthritis, unspecified: Secondary | ICD-10-CM | POA: Diagnosis present

## 2019-11-16 HISTORY — PX: I & D KNEE WITH POLY EXCHANGE: SHX5024

## 2019-11-16 LAB — TYPE AND SCREEN
ABO/RH(D): O POS
Antibody Screen: NEGATIVE

## 2019-11-16 SURGERY — IRRIGATION AND DEBRIDEMENT KNEE WITH POLY EXCHANGE
Anesthesia: General | Site: Knee | Laterality: Right

## 2019-11-16 MED ORDER — SCOPOLAMINE 1 MG/3DAYS TD PT72
1.0000 | MEDICATED_PATCH | TRANSDERMAL | Status: DC
  Administered 2019-11-16: 1.5 mg via TRANSDERMAL
  Filled 2019-11-16: qty 1

## 2019-11-16 MED ORDER — TRANEXAMIC ACID-NACL 1000-0.7 MG/100ML-% IV SOLN
1000.0000 mg | INTRAVENOUS | Status: AC
  Administered 2019-11-16: 10:00:00 1000 mg via INTRAVENOUS
  Filled 2019-11-16: qty 100

## 2019-11-16 MED ORDER — LACTATED RINGERS IV BOLUS
250.0000 mL | Freq: Once | INTRAVENOUS | Status: AC
  Administered 2019-11-16: 250 mL via INTRAVENOUS

## 2019-11-16 MED ORDER — SODIUM CHLORIDE 0.9 % IR SOLN
Status: DC | PRN
  Administered 2019-11-16: 3000 mL

## 2019-11-16 MED ORDER — MIDAZOLAM HCL 2 MG/2ML IJ SOLN
INTRAMUSCULAR | Status: AC
  Filled 2019-11-16: qty 2

## 2019-11-16 MED ORDER — MORPHINE SULFATE (PF) 4 MG/ML IV SOLN: 0.5000 mg | INTRAVENOUS | Status: DC | PRN

## 2019-11-16 MED ORDER — MIDAZOLAM HCL 5 MG/5ML IJ SOLN
INTRAMUSCULAR | Status: DC | PRN
  Administered 2019-11-16: 2 mg via INTRAVENOUS

## 2019-11-16 MED ORDER — CLINDAMYCIN PHOSPHATE 900 MG/50ML IV SOLN
900.0000 mg | INTRAVENOUS | Status: AC
  Administered 2019-11-16: 11:00:00 900 mg via INTRAVENOUS
  Filled 2019-11-16: qty 50

## 2019-11-16 MED ORDER — DEXAMETHASONE SODIUM PHOSPHATE 10 MG/ML IJ SOLN
INTRAMUSCULAR | Status: AC
  Filled 2019-11-16: qty 1

## 2019-11-16 MED ORDER — ASPIRIN EC 325 MG PO TBEC: 325.0000 mg | DELAYED_RELEASE_TABLET | Freq: Two times a day (BID) | ORAL | 0 refills | Status: AC

## 2019-11-16 MED ORDER — SODIUM CHLORIDE (PF) 0.9 % IJ SOLN
INTRAMUSCULAR | Status: AC
  Filled 2019-11-16: qty 50

## 2019-11-16 MED ORDER — ONDANSETRON HCL 4 MG/2ML IJ SOLN
INTRAMUSCULAR | Status: DC | PRN
  Administered 2019-11-16: 4 mg via INTRAVENOUS

## 2019-11-16 MED ORDER — METHOCARBAMOL 500 MG IVPB - SIMPLE MED
INTRAVENOUS | Status: AC
  Filled 2019-11-16: qty 50

## 2019-11-16 MED ORDER — METHOCARBAMOL 500 MG IVPB - SIMPLE MED
500.0000 mg | Freq: Four times a day (QID) | INTRAVENOUS | Status: DC | PRN
  Administered 2019-11-16: 500 mg via INTRAVENOUS

## 2019-11-16 MED ORDER — FENTANYL CITRATE (PF) 100 MCG/2ML IJ SOLN
INTRAMUSCULAR | Status: AC
  Filled 2019-11-16: qty 2

## 2019-11-16 MED ORDER — LACTATED RINGERS IV SOLN: INTRAVENOUS | Status: DC

## 2019-11-16 MED ORDER — ACETAMINOPHEN 500 MG PO TABS: 1000.0000 mg | ORAL_TABLET | Freq: Once | ORAL | Status: DC

## 2019-11-16 MED ORDER — OXYCODONE HCL 5 MG PO TABS: 5.0000 mg | ORAL_TABLET | Freq: Four times a day (QID) | ORAL | 0 refills | Status: DC | PRN

## 2019-11-16 MED ORDER — BUPIVACAINE LIPOSOME 1.3 % IJ SUSP
INTRAMUSCULAR | Status: DC | PRN
  Administered 2019-11-16: 20 mL

## 2019-11-16 MED ORDER — FENTANYL CITRATE (PF) 250 MCG/5ML IJ SOLN
INTRAMUSCULAR | Status: AC
  Filled 2019-11-16: qty 5

## 2019-11-16 MED ORDER — CELECOXIB 200 MG PO CAPS
400.0000 mg | ORAL_CAPSULE | Freq: Once | ORAL | Status: AC
  Administered 2019-11-16: 400 mg via ORAL
  Filled 2019-11-16: qty 2

## 2019-11-16 MED ORDER — IRRISEPT - 450ML BOTTLE WITH 0.05% CHG IN STERILE WATER, USP 99.95% OPTIME
TOPICAL | Status: DC | PRN
  Administered 2019-11-16: 300 mL

## 2019-11-16 MED ORDER — KETAMINE HCL 10 MG/ML IJ SOLN
INTRAMUSCULAR | Status: DC | PRN
  Administered 2019-11-16: 30 mg via INTRAVENOUS

## 2019-11-16 MED ORDER — SODIUM CHLORIDE (PF) 0.9 % IJ SOLN
INTRAMUSCULAR | Status: AC
  Filled 2019-11-16: qty 10

## 2019-11-16 MED ORDER — ACETAMINOPHEN 10 MG/ML IV SOLN
1000.0000 mg | Freq: Four times a day (QID) | INTRAVENOUS | Status: DC
  Administered 2019-11-16: 10:00:00 1000 mg via INTRAVENOUS
  Filled 2019-11-16: qty 100

## 2019-11-16 MED ORDER — LACTATED RINGERS IV BOLUS
500.0000 mL | Freq: Once | INTRAVENOUS | Status: AC
  Administered 2019-11-16: 500 mL via INTRAVENOUS

## 2019-11-16 MED ORDER — ONDANSETRON HCL 4 MG/2ML IJ SOLN
INTRAMUSCULAR | Status: AC
  Filled 2019-11-16: qty 2

## 2019-11-16 MED ORDER — OXYCODONE HCL 5 MG PO TABS
ORAL_TABLET | ORAL | Status: AC
  Filled 2019-11-16: qty 2

## 2019-11-16 MED ORDER — DEXAMETHASONE SODIUM PHOSPHATE 10 MG/ML IJ SOLN
8.0000 mg | Freq: Once | INTRAMUSCULAR | Status: AC
  Administered 2019-11-16: 10:00:00 8 mg via INTRAVENOUS

## 2019-11-16 MED ORDER — SODIUM CHLORIDE (PF) 0.9 % IJ SOLN
INTRAMUSCULAR | Status: DC | PRN
  Administered 2019-11-16: 60 mL

## 2019-11-16 MED ORDER — PROPOFOL 10 MG/ML IV BOLUS
INTRAVENOUS | Status: DC | PRN
  Administered 2019-11-16: 200 mg via INTRAVENOUS

## 2019-11-16 MED ORDER — EPHEDRINE SULFATE-NACL 50-0.9 MG/10ML-% IV SOSY
PREFILLED_SYRINGE | INTRAVENOUS | Status: DC | PRN
  Administered 2019-11-16 (×3): 10 mg via INTRAVENOUS

## 2019-11-16 MED ORDER — OXYCODONE HCL 5 MG PO TABS
5.0000 mg | ORAL_TABLET | Freq: Four times a day (QID) | ORAL | Status: DC | PRN
  Administered 2019-11-16: 10 mg via ORAL

## 2019-11-16 MED ORDER — POVIDONE-IODINE 10 % EX SWAB
2.0000 "application " | Freq: Once | CUTANEOUS | Status: AC
  Administered 2019-11-16: 2 via TOPICAL

## 2019-11-16 MED ORDER — HYDROMORPHONE HCL 2 MG/ML IJ SOLN
INTRAMUSCULAR | Status: AC
  Filled 2019-11-16: qty 1

## 2019-11-16 MED ORDER — CHLORHEXIDINE GLUCONATE 4 % EX LIQD: 60.0000 mL | Freq: Once | CUTANEOUS | Status: DC

## 2019-11-16 MED ORDER — PROMETHAZINE HCL 25 MG/ML IJ SOLN: 6.2500 mg | INTRAMUSCULAR | Status: DC | PRN

## 2019-11-16 MED ORDER — HYDROMORPHONE HCL 1 MG/ML IJ SOLN
INTRAMUSCULAR | Status: DC | PRN
  Administered 2019-11-16 (×2): .5 mg via INTRAVENOUS

## 2019-11-16 MED ORDER — KETAMINE HCL 10 MG/ML IJ SOLN
INTRAMUSCULAR | Status: AC
  Filled 2019-11-16: qty 1

## 2019-11-16 MED ORDER — METHOCARBAMOL 500 MG PO TABS: 500.0000 mg | ORAL_TABLET | Freq: Four times a day (QID) | ORAL | 0 refills | Status: DC | PRN

## 2019-11-16 MED ORDER — FENTANYL CITRATE (PF) 100 MCG/2ML IJ SOLN
INTRAMUSCULAR | Status: DC | PRN
  Administered 2019-11-16 (×5): 50 ug via INTRAVENOUS

## 2019-11-16 MED ORDER — METHOCARBAMOL 500 MG PO TABS: 500.0000 mg | ORAL_TABLET | Freq: Four times a day (QID) | ORAL | Status: DC | PRN

## 2019-11-16 MED ORDER — TRAMADOL HCL 50 MG PO TABS: 50.0000 mg | ORAL_TABLET | Freq: Four times a day (QID) | ORAL | 0 refills | Status: DC | PRN

## 2019-11-16 MED ORDER — LIDOCAINE 2% (20 MG/ML) 5 ML SYRINGE
INTRAMUSCULAR | Status: DC | PRN
  Administered 2019-11-16: 100 mg via INTRAVENOUS

## 2019-11-16 MED ORDER — FENTANYL CITRATE (PF) 100 MCG/2ML IJ SOLN
25.0000 ug | INTRAMUSCULAR | Status: DC | PRN
  Administered 2019-11-16: 50 ug via INTRAVENOUS

## 2019-11-16 SURGICAL SUPPLY — 54 items
BAG SPEC THK2 15X12 ZIP CLS (MISCELLANEOUS) ×1
BAG ZIPLOCK 12X15 (MISCELLANEOUS) ×3 IMPLANT
BNDG ELASTIC 6X5.8 VLCR STR LF (GAUZE/BANDAGES/DRESSINGS) ×3 IMPLANT
CLOSURE WOUND 1/2 X4 (GAUZE/BANDAGES/DRESSINGS) ×2
CLOTH BEACON ORANGE TIMEOUT ST (SAFETY) ×3 IMPLANT
COVER SURGICAL LIGHT HANDLE (MISCELLANEOUS) ×3 IMPLANT
COVER WAND RF STERILE (DRAPES) IMPLANT
CUFF TOURN SGL QUICK 34 (TOURNIQUET CUFF) ×3
CUFF TRNQT CYL 34X4.125X (TOURNIQUET CUFF) ×1 IMPLANT
DRAPE U-SHAPE 47X51 STRL (DRAPES) ×3 IMPLANT
DRSG ADAPTIC 3X8 NADH LF (GAUZE/BANDAGES/DRESSINGS) ×3 IMPLANT
DRSG AQUACEL AG ADV 3.5X10 (GAUZE/BANDAGES/DRESSINGS) ×2 IMPLANT
DRSG AQUACEL AG ADV 3.5X14 (GAUZE/BANDAGES/DRESSINGS) ×2 IMPLANT
DRSG PAD ABDOMINAL 8X10 ST (GAUZE/BANDAGES/DRESSINGS) ×9 IMPLANT
DURAPREP 26ML APPLICATOR (WOUND CARE) ×3 IMPLANT
ELECT REM PT RETURN 15FT ADLT (MISCELLANEOUS) ×3 IMPLANT
EVACUATOR 1/8 PVC DRAIN (DRAIN) ×3 IMPLANT
GAUZE SPONGE 4X4 12PLY STRL (GAUZE/BANDAGES/DRESSINGS) ×3 IMPLANT
GLOVE BIO SURGEON STRL SZ7.5 (GLOVE) ×3 IMPLANT
GLOVE BIO SURGEON STRL SZ8 (GLOVE) ×3 IMPLANT
GLOVE BIOGEL PI IND STRL 7.0 (GLOVE) ×1 IMPLANT
GLOVE BIOGEL PI IND STRL 8 (GLOVE) ×1 IMPLANT
GLOVE BIOGEL PI INDICATOR 7.0 (GLOVE) ×2
GLOVE BIOGEL PI INDICATOR 8 (GLOVE) ×2
GLOVE SURG SS PI 7.0 STRL IVOR (GLOVE) ×3 IMPLANT
GOWN STRL REUS W/TWL LRG LVL3 (GOWN DISPOSABLE) ×9 IMPLANT
HANDPIECE INTERPULSE COAX TIP (DISPOSABLE) ×3
IMMOBILIZER KNEE 20 (SOFTGOODS) ×3
IMMOBILIZER KNEE 20 THIGH 36 (SOFTGOODS) ×1 IMPLANT
INSERT TIBIAL RC3 RP SZ 4 30.0 (Insert) ×2 IMPLANT
JET LAVAGE IRRISEPT WOUND (IRRIGATION / IRRIGATOR) ×3
KIT TURNOVER KIT A (KITS) IMPLANT
LAVAGE JET IRRISEPT WOUND (IRRIGATION / IRRIGATOR) IMPLANT
MANIFOLD NEPTUNE II (INSTRUMENTS) ×3 IMPLANT
NS IRRIG 1000ML POUR BTL (IV SOLUTION) ×3 IMPLANT
PACK TOTAL KNEE CUSTOM (KITS) ×3 IMPLANT
PADDING CAST COTTON 6X4 STRL (CAST SUPPLIES) ×6 IMPLANT
PENCIL SMOKE EVACUATOR (MISCELLANEOUS) ×2 IMPLANT
PROTECTOR NERVE ULNAR (MISCELLANEOUS) ×3 IMPLANT
SET HNDPC FAN SPRY TIP SCT (DISPOSABLE) ×1 IMPLANT
SPONGE LAP 18X18 RF (DISPOSABLE) ×3 IMPLANT
STAPLER VISISTAT 35W (STAPLE) ×3 IMPLANT
STRIP CLOSURE SKIN 1/2X4 (GAUZE/BANDAGES/DRESSINGS) ×4 IMPLANT
SUT ETHILON 4 0 PS 2 18 (SUTURE) ×2 IMPLANT
SUT MNCRL AB 4-0 PS2 18 (SUTURE) ×3 IMPLANT
SUT PDS AB 1 CT1 27 (SUTURE) ×9 IMPLANT
SUT VIC AB 2-0 CT1 27 (SUTURE) ×9
SUT VIC AB 2-0 CT1 TAPERPNT 27 (SUTURE) ×3 IMPLANT
SUT VLOC 180 0 24IN GS25 (SUTURE) IMPLANT
SWAB COLLECTION DEVICE MRSA (MISCELLANEOUS) ×5 IMPLANT
SWAB CULTURE ESWAB REG 1ML (MISCELLANEOUS) ×5 IMPLANT
TRAY FOLEY MTR SLVR 16FR STAT (SET/KITS/TRAYS/PACK) ×3 IMPLANT
WATER STERILE IRR 1000ML POUR (IV SOLUTION) ×3 IMPLANT
WRAP KNEE MAXI GEL POST OP (GAUZE/BANDAGES/DRESSINGS) ×4 IMPLANT

## 2019-11-16 NOTE — Discharge Instructions (Addendum)
Gaynelle Arabian, MD [facility] [place] [address], [address] [place], [place] [address] [phone]    POSTOPERATIVE DIRECTIONS     BLOOD CLOT PREVENTION . Take a 325 mg Aspirin two times a day for three weeks following surgery. Then take an 81 mg Aspirin once a day for three weeks. Then discontinue Aspirin. Dennis Bast may resume your vitamins/supplements upon discharge from the hospital. . Do not take any NSAIDs (Advil, Aleve, Ibuprofen, Meloxicam, etc.) until you have discontinued the 325 mg Aspirin.  HOME CARE INSTRUCTIONS  . Wear the knee immobilizer at all times while awake until your first follow-up appointment on 11/25/2019. Marland Kitchen Remove items at home which could result in a fall. This includes throw rugs or furniture in walking pathways.   ICE to the affected knee as much as tolerated. Icing helps control swelling. If the swelling is well controlled you will be more comfortable and rehab easier. Continue to use ice on the knee for pain and swelling from surgery. You may notice swelling that will progress down to the foot and ankle. This is normal after surgery. Elevate the leg when you are not up walking on it.    Continue to use the breathing machine which will help keep your temperature down.  It is common for your temperature to cycle up and down following surgery, especially at night when you are not up moving around and exerting yourself.  The breathing machine keeps your lungs expanded and your temperature down.  Do not place pillow under knee, focus on keeping the knee straight while resting  DIET You may resume your previous home diet once you are discharged from the hospital.  DRESSING / [place] / SHOWERING . Keep your bulky bandage on for 2 days. On the third post-operative day you may remove the Ace bandage, cotton and gauze,. There is a waterproof adhesive bandage on your skin which will stay on for 7-10 days. Once you remove this  you will not need to place another bandage . You may begin showering 3 days after surgery. . Do not submerge the incision under water.  ACTIVITY For the first 5 days the key is rest and control of pain and swelling . You should rest, ice and elevate the leg for 50 minutes out of every hour. Get up and walk/stretch for 10 minutes per hour. After 5 days you can increase your activity slowly as tolerated . Walk with your walker as instructed. Use the walker until you are comfortable transitioning to a cane. Walk with the cane in the opposite hand of the operative leg. You may discontinue the cane once you are comfortable. . Avoid periods of inactivity such as sitting longer than an hour when not asleep. This helps prevent blood clots.  . Do your home exercises twice a day starting on post-operative day 3. On the days you go to physical therapy, just do the home exercises once that day. . Do not drive a car until released by your surgeon.  . Do not drive while taking narcotics.  TED HOSE STOCKINGS Wear the elastic stockings on both legs for three weeks following surgery during the day. You may remove them at night for sleeping.  WEIGHT BEARING You may bear weight as tolerated on the operative leg.  POSTOPERATIVE CONSTIPATION PROTOCOL Constipation - defined medically as fewer than three stools per week and severe constipation as less than one stool per week.  One of the most common issues  patients have following surgery is constipation.  Even if you have a regular bowel pattern at home, your normal regimen is likely to be disrupted due to multiple reasons following surgery.  Combination of anesthesia, postoperative narcotics, change in appetite and fluid intake all can affect your bowels.  In order to avoid complications following surgery, here are some recommendations in order to help you during your recovery period.  Colace (docusate) - Pick up an over-the-counter form of Colace or another stool  softener and take twice a day as long as you are requiring postoperative pain medications.  Take with a full glass of water daily.  If you experience loose stools or diarrhea, hold the colace until you stool forms back up.  If your symptoms do not get better within 1 week or if they get worse, check with your doctor.  MiraLax (polyethylene glycol) - Pick up over-the-counter to have on hand.  MiraLax is a solution that will increase the amount of water in your bowels to assist with bowel movements.  Take as directed and can mix with a glass of water, juice, soda, coffee, or tea.  Take if you go more than two days without a movement. Do not use MiraLax more than once per day. Call your doctor if you are still constipated or irregular after using this medication for 7 days in a row.  If you continue to have problems with postoperative constipation, please contact the office for further assistance and recommendations.  If you experience "the worst abdominal pain ever" or develop nausea or vomiting, please contact the office immediatly for further recommendations for treatment.  ITCHING  If you experience itching with your medications, try taking only a single pain pill, or even half a pain pill at a time.  You can also use Benadryl over the counter for itching or also to help with sleep.   MEDICATIONS See your medication summary on the "After Visit Summary" that the nursing staff will review with you prior to discharge.  You may have some home medications which will be placed on hold until you complete the course of blood thinner medication.  It is important for you to complete the blood thinner medication as prescribed by your surgeon.  Continue your approved medications as instructed at time of discharge.  PRECAUTIONS If you experience chest pain or shortness of breath - call 911 immediately for transfer to the hospital emergency department.  If you develop a fever greater that 101 F, purulent drainage  from wound, increased redness or drainage from wound, foul odor from the wound/dressing, or calf pain - CONTACT YOUR SURGEON.                                                   FOLLOW-UP APPOINTMENTS Make sure you keep all of your appointments after your operation with your surgeon and caregivers. You should call the office at the above phone number and make an appointment for approximately two weeks after the date of your surgery or on the date instructed by your surgeon outlined in the "After Visit Summary".  MAKE SURE YOU:  . Understand these instructions.  . Get help right away if you are not doing well or get worse.   Pick up stool softner and laxative for home use following surgery while on pain medications. May shower starting three  days after surgery. Please use a clean towel to pat the incision dry following showers. Continue to use ice for pain and swelling after surgery. Do not use any lotions or creams on the incision until instructed by your surgeon.

## 2019-11-16 NOTE — Anesthesia Procedure Notes (Signed)
Procedure Name: LMA Insertion Date/Time: 11/16/2019 10:12 AM Performed by: Epimenio Sarin, CRNA Pre-anesthesia Checklist: Patient identified, Emergency Drugs available, Suction available, Patient being monitored and Timeout performed Patient Re-evaluated:Patient Re-evaluated prior to induction Oxygen Delivery Method: Circle system utilized Preoxygenation: Pre-oxygenation with 100% oxygen Induction Type: IV induction LMA: LMA with gastric port inserted LMA Size: 4.0 Number of attempts: 1 Dental Injury: Teeth and Oropharynx as per pre-operative assessment

## 2019-11-16 NOTE — Evaluation (Signed)
Physical Therapy Evaluation Patient Details Name: [name] MRN: [id number] DOB: 06-17-1961 Today's Date: 11/16/2019   History of Present Illness  Patient is 59 y.o. male s/p I&D and polyexchange to Rt TKA on 11/16/19 with PMH significant for HTN, PNA, lymphedema, and multiple Rt TKR.    Clinical Impression  [name] is a 59 y.o. male POD 0 s/p Rt TKR. Patient reports independence with mobility at baseline with occasional use of SPC in home. Patient is now limited by functional impairments (see PT problem list below) and requires min gurad for transfers and gait with RW. Patient was able to ambulate ~90 feet with RW and min guard and cues for safe walker management. Patient educated on safe sequencing for stair mobility and pt and his wife demonstrated and verbalized safe guarding position for people assisting with mobility. Patient instructed in exercises to facilitate ROM and circulation. Patient will benefit from continued skilled PT interventions to address impairments and progress towards PLOF. Patient has met mobility goals at adequate level for discharge home; will continue to follow if pt continues acute stay to progress towards Mod I goals.     Follow Up Recommendations Follow surgeon's recommendation for DC plan and follow-up therapies    Equipment Recommendations  None recommended by PT    Recommendations for Other Services       Precautions / Restrictions Precautions Precautions: Fall Required Braces or Orthoses: Knee Immobilizer - Right Knee Immobilizer - Right: On at all times Restrictions Weight Bearing Restrictions: No      Mobility  Bed Mobility Overal bed mobility: Needs Assistance Bed Mobility: Supine to Sit     Supine to sit: HOB elevated;Supervision     General bed mobility comments: pt slow to mobilize in bed and increased effort required due to knee immobilizer  Transfers Overall transfer level: Needs assistance Equipment used: Rolling walker (2  wheeled) Transfers: Sit to/from Stand Sit to Stand: Supervision         General transfer comment: cues for safe hand placement, no assist required to initiate power up and rise.  Ambulation/Gait Ambulation/Gait assistance: Min guard Gait Distance (Feet): 90 Feet Assistive device: Rolling walker (2 wheeled) Gait Pattern/deviations: Step-to pattern;Decreased stride length;Decreased stance time - right;Decreased step length - left;Wide base of support;Decreased weight shift to right Gait velocity: decreased   General Gait Details: cues for safe step pattern in RW and pt maintained throughout. no overt LOB noted, pt maintained good use of UE's to reduce weight through bil UE's.  Stairs Stairs: Yes Stairs assistance: Min guard Stair Management: With walker;Backwards;Step to pattern Number of Stairs: 2 General stair comments: cues for safe step pattern with RW for "up with good, down with bad". verbal cues for safe sequencing of RW position and pt's wife assisted with stabilizign RW. Pt requried cues for safety with stepping up onto platform and pt/wife verbalized understandign of safe positioning.  Wheelchair Mobility    Modified Rankin (Stroke Patients Only)       Balance Overall balance assessment: Needs assistance Sitting-balance support: Feet supported Sitting balance-Leahy Scale: Good     Standing balance support: During functional activity;Bilateral upper extremity supported Standing balance-Leahy Scale: Fair            Pertinent Vitals/Pain Pain Assessment: 0-10 Pain Score: 5  Pain Location: Rt knee Pain Descriptors / Indicators: Aching;Sore Pain Intervention(s): Limited activity within patient's tolerance;Monitored during session;Repositioned    Home Living Family/patient expects to be discharged to:: Private residence Living Arrangements: Spouse/significant  other Available Help at Discharge: Family Type of Home: House Home Access: Stairs to enter Entrance  Stairs-Rails: Right Entrance Stairs-Number of Steps: 4 at front, 3 at the side door Home Layout: One [place]: [place] - 2 wheels;Wheelchair - manual;Bedside commode;Walker - 4 wheels      Prior Function Level of Independence: Independent with assistive device(s)         Comments: pt has been using a SPC to get around at home but does not take it to work or out to the house. 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     Hand Dominance   Dominant Hand: Right    Extremity/Trunk Assessment   Upper Extremity Assessment Upper Extremity Assessment: Overall WFL for tasks assessed    Lower Extremity Assessment Lower Extremity Assessment: RLE deficits/detail RLE Deficits / Details: pt with poor quad activation, knee immobilizer maintained for all mobility RLE: Unable to fully assess due to immobilization    Cervical / Trunk Assessment Cervical / Trunk Assessment: Normal  Communication   Communication: No difficulties  Cognition Arousal/Alertness: Awake/alert Behavior During Therapy: WFL for tasks assessed/performed Overall Cognitive Status: Within Functional Limits for tasks assessed           General Comments      Exercises Total Joint Exercises Ankle Circles/Pumps: AROM;Both;20 reps;Seated Quad Sets: AROM;Right;Seated;10 reps Hip ABduction/ADduction: AROM;5 reps;Seated;Right   Assessment/Plan    PT Assessment Patient needs continued PT services  PT Problem List Decreased strength;Decreased activity tolerance;Decreased range of motion;Decreased balance;Decreased mobility;Decreased knowledge of precautions       PT Treatment Interventions DME instruction;Stair training;Therapeutic activities;Balance training;Therapeutic exercise;Functional mobility training;Gait training;Patient/family education    PT Goals (Current goals can be found in the Care Plan section)  Acute Rehab PT Goals Patient Stated Goal: to get back to  independence PT Goal Formulation: With patient Time For Goal Achievement: 11/23/19 Potential to Achieve Goals: Good    Frequency 7X/week    AM-PAC PT "6 Clicks" Mobility  Outcome Measure Help needed turning from your back to your side while in a flat bed without using bedrails?: A Little Help needed moving from lying on your back to sitting on the side of a flat bed without using bedrails?: A Little Help needed moving to and from a bed to a chair (including a wheelchair)?: A Little Help needed standing up from a chair using your arms (e.g., wheelchair or bedside chair)?: A Little Help needed to walk in hospital room?: A Little Help needed climbing 3-5 steps with a railing? : A Little 6 Click Score: 18    End of Session Equipment Utilized During Treatment: Gait belt Activity Tolerance: Patient tolerated treatment well Patient left: in chair;with call bell/phone within reach;with family/visitor present Nurse Communication: Mobility status PT Visit Diagnosis: Difficulty in walking, not elsewhere classified (R26.2);Muscle weakness (generalized) (M62.81)    Time: 2179-8102 PT Time Calculation (min) (ACUTE ONLY): 33 min   Charges:   PT Evaluation $PT Eval Low Complexity: 1 Low PT Treatments $Gait Training: 8-22 mins        Verner Mould, DPT Physical Therapist with [facility] [phone]  11/16/2019 3:23 PM

## 2019-11-16 NOTE — Op Note (Signed)
NAMEANTONIA, CULBERTSON MEDICAL RECORD JA:[id number] ACCOUNT [id number] DATE OF BIRTH:December 31, 1960 FACILITY: [facility] LOCATION: [facility] PHYSICIAN:Marikay Roads Dulcy Fanny, MD  OPERATIVE REPORT  DATE OF PROCEDURE:  11/16/2019  PREOPERATIVE DIAGNOSIS:  Possible septic arthritis, right knee.  POSTOPERATIVE DIAGNOSIS:  Possible septic arthritis, right knee.  PROCEDURE:  Right knee irrigation and debridement with polyethylene exchange.  SURGEON:  Ollen Gross, MD  ASSISTANT:  Arther Abbott, PA-C  ANESTHESIA:  General.  ESTIMATED BLOOD LOSS:  25 mL.  DRAINS:  Hemovac x1.  COMPLICATIONS:  None.  CONDITION:  Stable to recovery.  TOURNIQUET TIME:  28 minutes at 300 mmHg.  BRIEF CLINICAL NOTE:  The patient is a 59 year old male with a long complex history with regards to his right knee.  He has had multiple revision surgeries and approximately 1-1/2 to 2 years ago had an irrigation and debridement and polyethylene  exchange.  He did well until recently, started developing effusions again.  Aspirations were negative for bacteria, but his white count was slightly elevated.  Given his history, we decided to do an irrigation and debridement and repeat poly exchange.   He has had so many surgeries that another 2-stage revision would probably not be possible and other options would be less desirable.  He was in favor of trying the irrigation and debridement and poly exchange to see if we could stabilize his knee like it  was in the past.  PROCEDURE IN DETAIL:  After successful administration of general anesthetic, a tourniquet was placed high on the right thigh.  Right lower extremity prepped and draped in the usual sterile fashion.  Extremity was wrapped in Esmarch, tourniquet inflated  to 300 mmHg.  Previous midline incision was utilized.  Skin cut with a 10 blade through subcutaneous tissue.  A fresh blade used to make a medial arthrotomy.  There was a moderate amount of serous fluid present which  was sent for routine Gram stain,  culture and sensitivity.  He did not have what appeared to be inflamed synovium; it was more of a fibrous necrosis-type tissue.  This was all removed and synovectomy performed.  He did have a fair amount of varus valgus laxity and I was able to sublux  his tibia forward when I flexed the knee.  We did sublux the tibia and then removed the tibial polyethylene.  It was a 30 mm thickness TC3.  After that was removed, then we thoroughly irrigated the joint with 3 L of saline using pulsatile lavage, also  with the IrriSept.  Once again, there was no evidence of any gross visible infection.  We then placed a new 30 mm TC3 rotating platform polyethylene.  This is the thickest that it comes and this was what was utilized.  Further irrigation was then  performed.  Twenty mL of Exparel mixed with 60 mL of saline was injected into the extensor mechanism, periosteum of the femur, subcutaneous tissue.  I also injected the posterior capsule before we placed the new insert.  Flexion against gravity was about  120 degrees.  He has full extension.  Patella tracks normally.  The arthrotomy was then closed over a Hemovac drain with a running 0 Stratafix suture.  The second limb of the drain was placed subcutaneous.  Tourniquet was released, total time 28  minutes.  Subcutaneous was then closed with interrupted 2-0 Vicryl and skin with staples.  The drains were hooked to suction.  Incision clean, dry and a bulky sterile dressing applied.  Prior to putting the  dressing, the drains were sewn in with a 4-0  nylon as they are going to stay in for a week.  A bulky sterile dressing was applied.  He was placed into a knee immobilizer, awakened and transported to recovery in stable condition.  Note that a surgical assistant was a medical necessity for this procedure to do it in a safe and expeditious manner.  Surgical assistance necessary for retraction of vital ligaments and neurovascular structures  and for proper positioning of the limb to  remove the old implant and place the new one.  VN/NUANCE  D:11/16/2019 T:11/16/2019 JOB:[id number]

## 2019-11-16 NOTE — Transfer of Care (Signed)
Immediate Anesthesia Transfer of Care Note  Patient: [name]  Procedure(s) Performed: IRRIGATION AND DEBRIDEMENT KNEE WITH POLY EXCHANGE (Right Knee)  Patient Location: PACU  Anesthesia Type:General  Level of Consciousness: drowsy  Airway & Oxygen Therapy: Patient Spontanous Breathing and Patient connected to face mask oxygen  Post-op Assessment: Report given to RN and Post -op Vital signs reviewed and stable  Post vital signs: Reviewed and stable  Last Vitals:  Vitals Value Taken Time  BP 139/87 11/16/19 1130  Temp    Pulse 55 11/16/19 1131  Resp 20 11/16/19 1131  SpO2 100 % 11/16/19 1131  Vitals shown include unvalidated device data.  Last Pain:  Vitals:   11/16/19 0830  TempSrc:   PainSc: 3          Complications: No apparent anesthesia complications

## 2019-11-16 NOTE — Anesthesia Postprocedure Evaluation (Signed)
Anesthesia Post Note  Patient: [name]  Procedure(s) Performed: IRRIGATION AND DEBRIDEMENT KNEE WITH POLY EXCHANGE (Right Knee)     Patient location during evaluation: PACU Anesthesia Type: General Level of consciousness: sedated Pain management: pain level controlled Vital Signs Assessment: post-procedure vital signs reviewed and stable Respiratory status: spontaneous breathing and respiratory function stable Cardiovascular status: stable Postop Assessment: no apparent nausea or vomiting Anesthetic complications: no    Last Vitals:  Vitals:   11/16/19 1215 11/16/19 1230  BP: 126/88 119/81  Pulse: (!) 47 (!) 48  Resp: 13 14  Temp:  36.4 C  SpO2: 94% 93%    Last Pain:  Vitals:   11/16/19 1230  TempSrc:   PainSc: 6                  Ilijah Doucet DANIEL

## 2019-11-16 NOTE — Interval H&P Note (Signed)
History and Physical Interval Note:  11/16/2019 9:32 AM  [name]  has presented today for surgery, with the diagnosis of Septic arthritis right knee.  The various methods of treatment have been discussed with the patient and family. After consideration of risks, benefits and other options for treatment, the patient has consented to  Procedure(s) with comments: IRRIGATION AND DEBRIDEMENT KNEE WITH POLY EXCHANGE (Right) - as a surgical intervention.  The patient's history has been reviewed, patient examined, no change in status, stable for surgery.  I have reviewed the patient's chart and labs.  Questions were answered to the patient's satisfaction.     [name]

## 2019-11-16 NOTE — Brief Op Note (Signed)
11/16/2019  11:26 AM  PATIENT:  [name]  59 y.o. male  PRE-OPERATIVE DIAGNOSIS:  Septic arthritis right knee  POST-OPERATIVE DIAGNOSIS:  Septic arthritis right knee  PROCEDURE:  Procedure(s) with comments: IRRIGATION AND DEBRIDEMENT KNEE WITH POLY EXCHANGE (Right) -  SURGEON:  Surgeon(s) and Role:    Ollen Gross, MD - Primary  PHYSICIAN ASSISTANT:   ASSISTANTS: Arther Abbott, PA-C   ANESTHESIA:   general  EBL:  25 mL   BLOOD ADMINISTERED:none  DRAINS: (Medium) Hemovact drain(s) in the right knee with  Suction Open   LOCAL MEDICATIONS USED:  OTHER Exparel  COUNTS:  YES  TOURNIQUET:   Total Tourniquet Time Documented: Thigh (Right) - 28 minutes Total: Thigh (Right) - 28 minutes   DICTATION: .Other Dictation: Dictation Number [id number]  PLAN OF CARE: Discharge to home after PACU  PATIENT DISPOSITION:  PACU - hemodynamically stable.

## 2019-11-17 ENCOUNTER — Encounter: Payer: Self-pay | Admitting: *Deleted

## 2019-11-17 NOTE — Discharge Summary (Addendum)
Physician Discharge Summary   Patient ID: [name] MRN: [id number] DOB/AGE: [birth year] 59 y.o.  Admit date: 11/16/2019 Discharge date: 11/16/2019  Primary Diagnosis: Possible septic arthritis, right knee   Admission Diagnoses:  Past Medical History:  Diagnosis Date  . Anemia    hx of  . Asthma   . Cellulitis of lower extremity    "usually RLL; this time it's got up to my lower abdoment" (02/11/2014)-series of antibiotics completed 02-28-14  . Diverticulosis   . Hypertension   . Infection of right knee (HCC)   . OSA (obstructive sleep apnea)    "quit wearing mask several years ago" (02/11/2014)  . Pneumonia   . PONV (postoperative nausea and vomiting)   . Rheumatoid arthritis (HCC) dx'd ~ 1977  . S/P PICC central line placement 03-16-14   right upper arm remains intact.05-11-14 removed 1 week ago.   Discharge Diagnoses:   Principal Problem:   Infection of prosthetic right knee joint (HCC)  Estimated body mass index is 45.84 kg/m as calculated from the following:   Height as of 11/13/19: 5\' 8"  (1.727 m).   Weight as of 11/13/19: 136.8 kg.  Procedure:  Procedure(s) (LRB): IRRIGATION AND DEBRIDEMENT KNEE WITH POLY EXCHANGE (Right)   Consults: None  HPI: The patient is a 59 year old male with a long complex history with regards to his right knee.  He has had multiple revision surgeries and approximately 1-1/2 to 2 years ago had an irrigation and debridement and polyethylene exchange.  He did well until recently, started developing effusions again.  Aspirations were negative for bacteria, but his white count was slightly elevated.  Given his history, we decided to do an irrigation and debridement and repeat poly exchange.  He has had so many surgeries that another 2-stage revision would probably not be possible and other options would be less desirable.  He was in favor of trying the irrigation and debridement and poly exchange to see if we could stabilize his knee like it was in the  past.  Laboratory Data: Admission on 11/16/2019, Discharged on 11/16/2019  Component Date Value Ref Range Status  . Specimen Description 11/16/2019    Final                   Value:SYNOVIAL RT KNEE Performed at [facility], [address], [place], [place]   . Special Requests 11/16/2019    Final                   Value:NONE Performed at [facility], [address], [place], [place]   . Gram Stain 11/16/2019    Final                   Value:NO WBC SEEN NO ORGANISMS SEEN Performed at [facility], [address], [place], [place]   . Culture 11/16/2019 NO GROWTH < 24 HOURS   Final  . Report Status 11/16/2019 PENDING   Incomplete  Hospital Outpatient Visit on 11/13/2019  Component Date Value Ref Range Status  . aPTT 11/13/2019 27  24 - 36 seconds Final   Performed at [facility], [address] [place], [place]  . WBC 11/13/2019 8.4  4.0 - 10.5 K/uL Final  . RBC 11/13/2019 4.83  4.22 - 5.81 MIL/uL Final  . Hemoglobin 11/13/2019 12.9* 13.0 - 17.0 g/dL Final  . HCT 11/15/2019 41.9  39.0 - 52.0 % Final  .  MCV 11/13/2019 86.7  80.0 - 100.0 fL Final  . MCH 11/13/2019 26.7  26.0 - 34.0 pg Final  . MCHC 11/13/2019 30.8  30.0 - 36.0 g/dL Final  . RDW 22/29/7989 16.8* 11.5 - 15.5 % Final  . Platelets 11/13/2019 364  150 - 400 K/uL Final  . nRBC 11/13/2019 0.0  0.0 - 0.2 % Final   Performed at [facility], [address], [place], [place] [address]  . Sodium 11/13/2019 136  135 - 145 mmol/L Final  . Potassium 11/13/2019 4.4  3.5 - 5.1 mmol/L Final  . Chloride 11/13/2019 103  98 - 111 mmol/L Final  . CO2 11/13/2019 25  22 - 32 mmol/L Final  . Glucose, Bld 11/13/2019 120* 70 - 99 mg/dL Final  . BUN 17/40/8144 24* 6 - 20 mg/dL Final  . Creatinine, Ser 11/13/2019 1.09  0.61 - 1.24 mg/dL Final  . Calcium 81/85/6314 9.0  8.9 - 10.3 mg/dL Final  . Total Protein  11/13/2019 7.8  6.5 - 8.1 g/dL Final  . Albumin 97/11/6376 3.6  3.5 - 5.0 g/dL Final  . AST 58/85/0277 20  15 - 41 U/L Final  . ALT 11/13/2019 16  0 - 44 U/L Final  . Alkaline Phosphatase 11/13/2019 59  38 - 126 U/L Final  . Total Bilirubin 11/13/2019 0.8  0.3 - 1.2 mg/dL Final  . GFR calc non Af Amer 11/13/2019 >60  >60 mL/min Final  . GFR calc Af Amer 11/13/2019 >60  >60 mL/min Final  . Anion gap 11/13/2019 8  5 - 15 Final   Performed at [facility], [address] [place], [place] [address]  . Prothrombin Time 11/13/2019 13.6  11.4 - 15.2 seconds Final  . INR 11/13/2019 1.1  0.8 - 1.2 Final   Comment: (NOTE) INR goal varies based on device and disease states. Performed at [facility], [address], [place], [place] [address]   . ABO/RH(D) 11/13/2019 O POS   Final  . Antibody Screen 11/13/2019 NEG   Final  . Sample Expiration 11/13/2019 11/19/2019,2359   Final  . Extend sample reason 11/13/2019    Final                   Value:NO TRANSFUSIONS OR PREGNANCY IN THE PAST 3 MONTHS Performed at [facility], [address] [place], [place] [address]   . MRSA, PCR 11/13/2019 NEGATIVE  NEGATIVE Final  . Staphylococcus aureus 11/13/2019 NEGATIVE  NEGATIVE Final   Comment: (NOTE) The Xpert SA Assay (FDA approved for NASAL specimens in patients 20 years of age and older), is one component of a comprehensive surveillance program. It is not intended to diagnose infection nor to guide or monitor treatment. Performed at [facility], [address], [place], [place] [address]   Hospital Outpatient Visit on 11/12/2019  Component Date Value Ref Range Status  . SARS Coronavirus 2 11/12/2019 NEGATIVE  NEGATIVE Final   Comment: (NOTE) SARS-CoV-2 target nucleic acids are NOT DETECTED. The SARS-CoV-2 RNA is generally detectable in upper and lower respiratory specimens during the acute phase of infection.  Negative results do not preclude SARS-CoV-2 infection, do not rule out co-infections with other pathogens, and should not be used as the sole basis for treatment or other patient management decisions. Negative results must be combined with clinical observations, patient history, and epidemiological information. The expected result is Negative. Fact Sheet for Patients: HairSlick.no Fact Sheet for Healthcare Providers:  https://www.woods-mathews.com/ This test is not yet approved or cleared by the [place] and  has been authorized for detection and/or diagnosis of SARS-CoV-2 by FDA under an Emergency Use Authorization (EUA). This EUA will remain  in effect (meaning this test can be used) for the duration of the COVID-19 declaration under Section 56                          4(b)(1) of the Act, 21 U.S.C. section 360bbb-3(b)(1), unless the authorization is terminated or revoked sooner. Performed at [facility], [address], [place], [place] [address]      X-Rays:No results found.  EKG: Orders placed or performed during the hospital encounter of 11/13/19  . EKG 12 lead  . EKG 12 lead     Hospital Course: [name] is a 59 y.o. who was admitted to [facility]. They were brought to the operating room on 11/16/2019 and underwent Procedure(s): [place].  Patient tolerated the procedure well and was later transferred to the recovery room for postoperative care. They were given PO and IV analgesics for pain control following their surgery. They were given 24 hours of postoperative antibiotics of  Anti-infectives (From admission, onward)   Start     Dose/Rate Route Frequency Ordered Stop   11/16/19 0800  clindamycin (CLEOCIN) IVPB 900 mg     900 mg 100 mL/hr over 30 Minutes Intravenous On call to O.R. 11/16/19 9937 11/16/19 1108     and started on DVT prophylaxis in the form of  Aspirin. Physical therapy was ordered for total joint protocol. Patient received three normal saline boluses in recovery and completed a session of physical therapy. Pain was well controlled with medications, patient was able to void on his own, and was meeting his goals with therapy. Pt was discharged to home on a same day discharge in stable condition.  Intraop gram stain of synovial fluid was negative. Awaiting C&S.  Diet: Regular diet Activity: WBAT Follow-up: in 2 weeks Disposition: Home Discharged Condition: stable    Allergies as of 11/16/2019      Reactions   Avelox [moxifloxacin Hcl In Nacl] Hives, Shortness Of Breath, Itching   Doxycycline Itching   Rocephin [ceftriaxone Sodium In Dextrose] Hives, Itching   Sulfa Antibiotics Itching   Vancomycin Hives, Itching   Erythrocin Rash   Sulfasalazine Rash      Medication List    TAKE these medications   aspirin EC 325 MG tablet Take 1 tablet (325 mg total) by mouth 2 (two) times daily for 21 days. Then take one 81 mg aspirin once a day for three weeks. Then discontinue aspirin. What changed:   when to take this  additional instructions   diltiazem 240 MG 24 hr capsule Commonly known as: CARDIZEM CD Take 240 mg by mouth daily.   Fluticasone-Salmeterol 250-50 MCG/DOSE Aepb Commonly known as: ADVAIR Inhale 1 puff 2 (two) times daily as needed into the lungs (for shortness of breath or wheezing).   folic acid 1 MG tablet Commonly known as: FOLVITE Take 1 mg by mouth daily.   losartan 100 MG tablet Commonly known as: COZAAR Take 100 mg by mouth daily.   methocarbamol 500 MG tablet Commonly known as: ROBAXIN Take 1 tablet (500 mg total) by mouth every 6 (six) hours as needed for muscle spasms. What changed: when to take this   methotrexate 2.5 MG tablet Commonly  known as: RHEUMATREX Take 20 mg by mouth every Monday. Caution:Chemotherapy. Protect from light.   metoprolol succinate 100 MG 24 hr tablet Commonly  known as: TOPROL-XL Take 100 mg by mouth daily. Take with or immediately following a meal.   nystatin cream Commonly known as: MYCOSTATIN Apply 1 application as needed topically (for genital area only while on antiobiotic).   oxyCODONE 5 MG immediate release tablet Commonly known as: Roxicodone Take 1-2 tablets (5-10 mg total) by mouth every 6 (six) hours as needed for severe pain.   RABEprazole 20 MG tablet Commonly known as: ACIPHEX Take 20 mg by mouth daily.   traMADol 50 MG tablet Commonly known as: ULTRAM Take 1-2 tablets (50-100 mg total) by mouth every 6 (six) hours as needed for moderate pain. What changed: reasons to take this   triamterene-hydrochlorothiazide 37.5-25 MG tablet Commonly known as: MAXZIDE-25 Take 1 tablet by mouth daily.      Follow-up Information    Ollen Gross, MD. Schedule an appointment as soon as possible for a visit on 11/25/2019.   Specialty: Orthopedic Surgery Contact information: [address] [place] [address] [phone]           Signed: Arther Abbott, PA-C Orthopedic Surgery 11/17/2019, 8:12 AM

## 2019-11-19 LAB — BODY FLUID CULTURE
Culture: NO GROWTH
Gram Stain: NONE SEEN

## 2019-11-21 LAB — ANAEROBIC CULTURE

## 2020-02-18 NOTE — Patient Instructions (Signed)
DUE TO COVID-19 ONLY ONE VISITOR IS ALLOWED TO COME WITH YOU AND STAY IN THE WAITING ROOM ONLY DURING PRE OP AND PROCEDURE DAY OF SURGERY. THE 1 VISITOR MAY VISIT WITH YOU AFTER SURGERY IN YOUR PRIVATE ROOM DURING VISITING HOURS ONLY!  YOU NEED TO HAVE A COVID 19 TEST ON: 02/18/20 , THIS TEST MUST BE DONE BEFORE SURGERY, COME  [address], [place] , [address].  [facility]) ONCE YOUR COVID TEST IS COMPLETED, PLEASE BEGIN THE QUARANTINE INSTRUCTIONS AS OUTLINED IN YOUR HANDOUT.                [name]    Your procedure is scheduled on: 02/22/20   Report to [facility] Main  Entrance   Report to admitting at: 12:50 PM     Call this number if you have problems the morning of surgery [phone]    Remember: Do not eat solid food  :After Midnight. Clear liquids from midnight until: 11:50 am     CLEAR LIQUID DIET   Foods Allowed                                                                     Foods Excluded  Coffee and tea, regular and decaf                             liquids that you cannot  Plain Jell-O any favor except red or purple                                           see through such as: Fruit ices (not with fruit pulp)                                     milk, soups, orange juice  Iced Popsicles                                    All solid food Carbonated beverages, regular and diet                                    Cranberry, grape and apple juices Sports drinks like Gatorade Lightly seasoned clear broth or consume(fat free) Sugar, honey syrup  Sample Menu Breakfast                                Lunch                                     Supper Cranberry juice                    Beef broth  Chicken broth Jell-O                                     Grape juice                           Apple juice Coffee or tea                        Jell-O                                      Popsicle                                                 Coffee or tea                        Coffee or tea  _____________________________________________________________________    BRUSH YOUR TEETH MORNING OF SURGERY AND RINSE YOUR MOUTH OUT, NO CHEWING GUM CANDY OR MINTS.     Take these medicines the morning of surgery with A SIP OF WATER: diltiazem,metoprolol,rabeprazole.Use inhalers as usual.                               You may not have any metal on your body including hair pins and              piercings  Do not wear jewelry, lotions, powders or perfumes, deodorant             Men may shave face and neck.   Do not bring valuables to the hospital. [facility] IS NOT             RESPONSIBLE   FOR VALUABLES.  Contacts, dentures or bridgework may not be worn into surgery.  Leave suitcase in the car. After surgery it may be brought to your room.     Patients discharged the day of surgery will not be allowed to drive home. IF YOU ARE HAVING SURGERY AND GOING HOME THE SAME DAY, YOU MUST HAVE AN ADULT TO DRIVE YOU HOME AND BE WITH YOU FOR 24 HOURS. YOU MAY GO HOME BY TAXI OR UBER OR ORTHERWISE, BUT AN ADULT MUST ACCOMPANY YOU HOME AND STAY WITH YOU FOR 24 HOURS.  Name and phone number of your driver:  Special Instructions: N/A              Please read over the following fact sheets you were given: _____________________________________________________________________             [facility] - Preparing for Surgery Before surgery, you can play an important role.  Because skin is not sterile, your skin needs to be as free of germs as possible.  You can reduce the number of germs on your skin by washing with CHG (chlorahexidine gluconate) soap before surgery.  CHG is an antiseptic cleaner which kills germs and bonds with the skin to continue killing germs even after washing. Please DO NOT use if you have an allergy to CHG or antibacterial soaps.  If your skin  becomes reddened/irritated stop using the CHG and inform  your nurse when you arrive at Short Stay. Do not shave (including legs and underarms) for at least 48 hours prior to the first CHG shower.  You may shave your face/neck. Please follow these instructions carefully:  1.  Shower with CHG Soap the night before surgery and the  morning of Surgery.  2.  If you choose to wash your hair, wash your hair first as usual with your  normal  shampoo.  3.  After you shampoo, rinse your hair and body thoroughly to remove the  shampoo.                           4.  Use CHG as you would any other liquid soap.  You can apply chg directly  to the skin and wash                       Gently with a scrungie or clean washcloth.  5.  Apply the CHG Soap to your body ONLY FROM THE NECK DOWN.   Do not use on face/ open                           Wound or open sores. Avoid contact with eyes, ears mouth and genitals (private parts).                       Wash face,  Genitals (private parts) with your normal soap.             6.  Wash thoroughly, paying special attention to the area where your surgery  will be performed.  7.  Thoroughly rinse your body with warm water from the neck down.  8.  DO NOT shower/wash with your normal soap after using and rinsing off  the CHG Soap.                9.  Pat yourself dry with a clean towel.            10.  Wear clean pajamas.            11.  Place clean sheets on your bed the night of your first shower and do not  sleep with pets. Day of Surgery : Do not apply any lotions/deodorants the morning of surgery.  Please wear clean clothes to the hospital/surgery center.  FAILURE TO FOLLOW THESE INSTRUCTIONS MAY RESULT IN THE CANCELLATION OF YOUR SURGERY PATIENT SIGNATURE_________________________________  NURSE SIGNATURE__________________________________  ________________________________________________________________________

## 2020-02-18 NOTE — Progress Notes (Signed)
Can you please place some orders for the upcomming surgery.PST appointment on 02/19/20.Thanks

## 2020-02-19 ENCOUNTER — Encounter (HOSPITAL_COMMUNITY)
Admission: RE | Admit: 2020-02-19 | Discharge: 2020-02-19 | Disposition: A | Discharge status: Home / Self Care | Payer: 59 | Source: Ambulatory Visit | Attending: Orthopedic Surgery | Admitting: Orthopedic Surgery

## 2020-02-19 ENCOUNTER — Other Ambulatory Visit: Payer: Self-pay

## 2020-02-19 ENCOUNTER — Encounter (HOSPITAL_COMMUNITY): Payer: Self-pay

## 2020-02-19 ENCOUNTER — Other Ambulatory Visit (HOSPITAL_COMMUNITY)
Admission: RE | Admit: 2020-02-19 | Discharge: 2020-02-19 | Disposition: A | Discharge status: Home / Self Care | Payer: 59 | Source: Ambulatory Visit | Attending: Orthopedic Surgery | Admitting: Orthopedic Surgery

## 2020-02-19 DIAGNOSIS — Z01812 Encounter for preprocedural laboratory examination: Secondary | ICD-10-CM | POA: Diagnosis not present

## 2020-02-19 DIAGNOSIS — Z20822 Contact with and (suspected) exposure to covid-19: Secondary | ICD-10-CM | POA: Diagnosis not present

## 2020-02-19 LAB — BASIC METABOLIC PANEL
Anion gap: 9 (ref 5–15)
BUN: 22 mg/dL — ABNORMAL HIGH (ref 6–20)
CO2: 24 mmol/L (ref 22–32)
Calcium: 8.9 mg/dL (ref 8.9–10.3)
Chloride: 107 mmol/L (ref 98–111)
Creatinine, Ser: 1.01 mg/dL (ref 0.61–1.24)
GFR calc Af Amer: >60 mL/min (ref >60)
GFR calc non Af Amer: >60 mL/min (ref >60)
Glucose, Bld: 104 mg/dL — ABNORMAL HIGH (ref 70–99)
Potassium: 4.5 mmol/L (ref 3.5–5.1)
Sodium: 140 mmol/L (ref 135–145)

## 2020-02-19 LAB — SURGICAL PCR SCREEN
MRSA, PCR: NEGATIVE
Staphylococcus aureus: NEGATIVE

## 2020-02-19 LAB — CBC
HCT: 39.8 % (ref 39.0–52.0)
Hemoglobin: 12.1 g/dL — ABNORMAL LOW (ref 13.0–17.0)
MCH: 26.4 pg (ref 26.0–34.0)
MCHC: 30.4 g/dL (ref 30.0–36.0)
MCV: 86.9 fL (ref 80.0–100.0)
Platelets: 384 10*3/uL (ref 150–400)
RBC: 4.58 MIL/uL (ref 4.22–5.81)
RDW: 18.2 % — ABNORMAL HIGH (ref 11.5–15.5)
WBC: 10 10*3/uL (ref 4.0–10.5)
nRBC: 0 % (ref 0.0–0.2)

## 2020-02-19 NOTE — Progress Notes (Signed)
PCP - Jarome Matin  Cardiologist -   Chest x-ray -  EKG - 11/13/19. Stress Test -  ECHO -  Cardiac Cath -   Sleep Study -  CPAP -   Fasting Blood Sugar -  Checks Blood Sugar _____ times a day  Blood Thinner Instructions: Aspirin Instructions: Last Dose:  Anesthesia review:   Patient denies shortness of breath, fever, cough and chest pain at PAT appointment   Patient verbalized understanding of instructions that were given to them at the PAT appointment. Patient was also instructed that they will need to review over the PAT instructions again at home before surgery.

## 2020-02-20 LAB — SARS CORONAVIRUS 2 (TAT 6-24 HRS): SARS Coronavirus 2: NEGATIVE

## 2020-02-21 NOTE — H&P (Signed)
[name] is an 59 y.o. male.   Chief Complaint: Right knee pain and swelling HPI: 59 yo male with complex history regarding his right knee. 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 He no longer has joint swelling but has recurrent subcutaneous fluid collections. He has pain associated with this. He had a similar circumstance several years ago which responded to irrigation and debridement with maintenance of a drainage catheter for approximately 2 weeks until the fluid resolved. We have discussed that he will most likely need a resection arthroplasty to eliminate the infection but he is not ready to pursue that and we plan on the irrigation and debridement to see if it provides relief like it did in the past  Past Medical History:  Diagnosis Date  . Anemia    hx of  . Asthma   . Cellulitis of lower extremity    "usually RLL; this time it's got up to my lower abdoment" (02/11/2014)-series of antibiotics completed 02-28-14  . Diverticulosis   . Hypertension   . Infection of right knee (HCC)   . OSA (obstructive sleep apnea)    "quit wearing mask several years ago" (02/11/2014)  . Pneumonia   . PONV (postoperative nausea and vomiting)   . Rheumatoid arthritis (HCC) dx'd ~ 1977  . S/P PICC central line placement 03-16-14   right upper arm remains intact.05-11-14 removed 1 week ago.    Past Surgical History:  Procedure Laterality Date  . EXCISIONAL HEMORRHOIDECTOMY    . EXCISIONAL TOTAL KNEE ARTHROPLASTY WITH ANTIBIOTIC SPACERS Right 03/18/2014   Procedure: RIGHT KNEE RESECTION ARTHROPLASTY WITH ANTIBIOTIC SPACERS;  Surgeon: Loanne Drilling, MD;  Location: [facility];  Service: Orthopedics;  Laterality: Right;  . FOREIGN BODY REMOVAL Left    "BB" removal above left eye-teen yrs.  Marland Kitchen HERNIA REPAIR    . I & D KNEE WITH POLY EXCHANGE Right 04/25/2016   Procedure: RIGHT KNEE IRRIGATION AND DEBRIDEMENT WITH POLY EXCHANGE;  Surgeon:  Ollen Gross, MD;  Location: [facility];  Service: Orthopedics;  Laterality: Right;  . I & D KNEE WITH POLY EXCHANGE Right 10/01/2016   Procedure: IRRIGATION AND DEBRIDEMENT KNEE WITH POLY EXCHANGE;  Surgeon: Ollen Gross, MD;  Location: [facility];  Service: Orthopedics;  Laterality: Right;  . I & D KNEE WITH POLY EXCHANGE Right 08/26/2017   Procedure: IRRIGATION AND DEBRIDEMENT KNEE WITH POLY EXCHANGE;  Surgeon: Ollen Gross, MD;  Location: [facility];  Service: Orthopedics;  Laterality: Right;  . I & D KNEE WITH POLY EXCHANGE Right 11/16/2019   Procedure: IRRIGATION AND DEBRIDEMENT KNEE WITH POLY EXCHANGE;  Surgeon: Ollen Gross, MD;  Location: [facility];  Service: Orthopedics;  Laterality: Right;   . INCISION AND DRAINAGE Right 09/28/2014   Procedure: INCISION AND DRAINAGE RIGHT KNEE;  Surgeon: Loanne Drilling, MD;  Location: [facility];  Service: Orthopedics;  Laterality: Right;  . KNEE BURSECTOMY Right 03/13/2017   Procedure: RIGHT KNEE PREPATELLAR BURSECTOMY;  Surgeon: Ollen Gross, MD;  Location: [facility];  Service: Orthopedics;  Laterality: Right;  . picc line placement Right    right upper arm  . 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 REVISION TOTAL KNEE ARTHROPLASTY Right 2012  . SPIGELIAN HERNIA Right 12/04/2015   Procedure: INCARCERATED SPIGELIAN HERNIA REPAIR WITH MESH;  Surgeon: Violeta Gelinas, MD;  Location:  [facility];  Service: General;  Laterality: Right;  . TOTAL KNEE ARTHROPLASTY Right 05/21/2014   Procedure: RIGHT KNEE ARTHROPLASTY REINPLANTATION;  Surgeon: Loanne Drilling, MD;  Location: [facility];  Service: Orthopedics;  Laterality: Right;    Family History  Problem Relation Age of Onset  . Colon cancer Mother   . Hypertension Mother   . Diabetes Mellitus II Paternal Grandmother    Social History:  reports that he quit smoking about 38 years ago. His  smoking use included cigarettes. He has a 3.00 pack-year smoking history. His smokeless tobacco use includes snuff. He reports that he does not drink alcohol or use drugs.  Allergies:  Allergies  Allergen Reactions  . Avelox [Moxifloxacin Hcl In Nacl] Hives, Shortness Of Breath and Itching  . Doxycycline Itching  . Rocephin [Ceftriaxone Sodium In Dextrose] Hives and Itching  . Sulfa Antibiotics Itching  . Vancomycin Hives and Itching  . Erythrocin Rash  . Penicillins Rash  . Sulfasalazine Rash    No medications prior to admission.    Results for orders placed or performed during the hospital encounter of 02/19/20 (from the past 48 hour(s))  Surgical pcr screen     Status: None   Collection Time: 02/19/20  3:00 PM   Specimen: Nasal Mucosa; Nasal Swab  Result Value Ref Range   MRSA, PCR NEGATIVE NEGATIVE   Staphylococcus aureus NEGATIVE NEGATIVE    Comment: (NOTE) The Xpert SA Assay (FDA approved for NASAL specimens in patients 40 years of age and older), is one component of a comprehensive surveillance program. It is not intended to diagnose infection nor to guide or monitor treatment. Performed at [facility], [address], [place], [place] [address]   Basic metabolic panel     Status: Abnormal   Collection Time: 02/19/20  3:08 PM  Result Value Ref Range   Sodium 140 135 - 145 mmol/L   Potassium 4.5 3.5 - 5.1 mmol/L   Chloride 107 98 - 111 mmol/L   CO2 24 22 - 32 mmol/L   Glucose, Bld 104 (H) 70 - 99 mg/dL    Comment: Glucose reference range applies only to samples taken after fasting for at least 8 hours.   BUN 22 (H) 6 - 20 mg/dL   Creatinine, Ser 5.17 0.61 - 1.24 mg/dL   Calcium 8.9 8.9 - 00.1 mg/dL   GFR calc non Af Amer >60 >60 mL/min   GFR calc Af Amer >60 >60 mL/min   Anion gap 9 5 - 15    Comment: Performed at [facility], [address] [place], [place] [address]  CBC     Status: Abnormal   Collection Time: 02/19/20   3:08 PM  Result Value Ref Range   WBC 10.0 4.0 - 10.5 K/uL   RBC 4.58 4.22 - 5.81 MIL/uL   Hemoglobin 12.1 (L) 13.0 - 17.0 g/dL   HCT 96.7 59.1 - 63.8 %   MCV 86.9 80.0 - 100.0 fL   MCH 26.4 26.0 - 34.0 pg   MCHC 30.4 30.0 - 36.0 g/dL   RDW 46.6 (H) 59.9 - 35.7 %   Platelets 384 150 - 400 K/uL   nRBC 0.0 0.0 - 0.2 %    Comment: Performed at [facility], [address], [place], [place] [address]   No results found.  Review of Systems  There were no vitals taken for this visit. Physical Exam Physical Examination: General appearance - alert, well appearing, and  in no distress Mental status - alert, oriented to person, place, and time Chest - clear to auscultation, no wheezes, rales or rhonchi, symmetric air entry Heart - normal rate, regular rhythm, normal S1, S2, no murmurs, rubs, clicks or gallops Abdomen - soft, nontender, nondistended, no masses or organomegaly Neurological - alert, oriented, normal speech, no focal findings or movement disorder noted Right knee with no warmth or erythema. There is a subcutaneous fluid collection he has no pain on ROM of the kinee  Assessment/Plan Right knee recurrent pain and swelling- Plan irrigation and debridement with drain placement. Discussed in detail with the patient who elects to proceed.  Gaynelle Arabian, MD 02/21/2020, 7:52 AM

## 2020-02-22 ENCOUNTER — Encounter (HOSPITAL_COMMUNITY)
Admission: RE | Disposition: A | Discharge status: Home / Self Care | Payer: Self-pay | Source: Home / Self Care | Attending: Orthopedic Surgery

## 2020-02-22 ENCOUNTER — Telehealth (HOSPITAL_COMMUNITY): Payer: Self-pay | Admitting: *Deleted

## 2020-02-22 ENCOUNTER — Ambulatory Visit (HOSPITAL_COMMUNITY)
Admission: RE | Admit: 2020-02-22 | Discharge: 2020-02-22 | Disposition: A | Discharge status: Home / Self Care | Payer: 59 | Attending: Orthopedic Surgery | Admitting: Orthopedic Surgery

## 2020-02-22 ENCOUNTER — Encounter (HOSPITAL_COMMUNITY): Payer: Self-pay | Admitting: Orthopedic Surgery

## 2020-02-22 ENCOUNTER — Ambulatory Visit (HOSPITAL_COMMUNITY): Payer: 59 | Admitting: Certified Registered Nurse Anesthetist

## 2020-02-22 DIAGNOSIS — G4733 Obstructive sleep apnea (adult) (pediatric): Secondary | ICD-10-CM | POA: Insufficient documentation

## 2020-02-22 DIAGNOSIS — Z6841 Body Mass Index (BMI) 40.0 and over, adult: Secondary | ICD-10-CM | POA: Insufficient documentation

## 2020-02-22 DIAGNOSIS — I10 Essential (primary) hypertension: Secondary | ICD-10-CM | POA: Diagnosis not present

## 2020-02-22 DIAGNOSIS — Z880 Allergy status to penicillin: Secondary | ICD-10-CM | POA: Diagnosis not present

## 2020-02-22 DIAGNOSIS — M069 Rheumatoid arthritis, unspecified: Secondary | ICD-10-CM | POA: Diagnosis not present

## 2020-02-22 DIAGNOSIS — J45909 Unspecified asthma, uncomplicated: Secondary | ICD-10-CM | POA: Diagnosis not present

## 2020-02-22 DIAGNOSIS — M1990 Unspecified osteoarthritis, unspecified site: Secondary | ICD-10-CM | POA: Insufficient documentation

## 2020-02-22 DIAGNOSIS — Z881 Allergy status to other antibiotic agents status: Secondary | ICD-10-CM | POA: Insufficient documentation

## 2020-02-22 DIAGNOSIS — Z87891 Personal history of nicotine dependence: Secondary | ICD-10-CM | POA: Insufficient documentation

## 2020-02-22 DIAGNOSIS — Z96653 Presence of artificial knee joint, bilateral: Secondary | ICD-10-CM | POA: Diagnosis not present

## 2020-02-22 DIAGNOSIS — Z882 Allergy status to sulfonamides status: Secondary | ICD-10-CM | POA: Diagnosis not present

## 2020-02-22 DIAGNOSIS — M009 Pyogenic arthritis, unspecified: Secondary | ICD-10-CM | POA: Diagnosis present

## 2020-02-22 DIAGNOSIS — Y831 Surgical operation with implant of artificial internal device as the cause of abnormal reaction of the patient, or of later complication, without mention of misadventure at the time of the procedure: Secondary | ICD-10-CM | POA: Diagnosis not present

## 2020-02-22 DIAGNOSIS — Z96643 Presence of artificial hip joint, bilateral: Secondary | ICD-10-CM | POA: Insufficient documentation

## 2020-02-22 DIAGNOSIS — T8453XA Infection and inflammatory reaction due to internal right knee prosthesis, initial encounter: Secondary | ICD-10-CM | POA: Diagnosis not present

## 2020-02-22 HISTORY — PX: I & D KNEE WITH POLY EXCHANGE: SHX5024

## 2020-02-22 SURGERY — IRRIGATION AND DEBRIDEMENT KNEE WITH POLY EXCHANGE
Anesthesia: General | Site: Knee | Laterality: Right

## 2020-02-22 MED ORDER — SCOPOLAMINE 1 MG/3DAYS TD PT72: 1.0000 | MEDICATED_PATCH | Freq: Once | TRANSDERMAL | Status: DC

## 2020-02-22 MED ORDER — DEXAMETHASONE SODIUM PHOSPHATE 10 MG/ML IJ SOLN
INTRAMUSCULAR | Status: AC
  Filled 2020-02-22: qty 1

## 2020-02-22 MED ORDER — PROPOFOL 10 MG/ML IV BOLUS
INTRAVENOUS | Status: DC | PRN
  Administered 2020-02-22: 200 mg via INTRAVENOUS

## 2020-02-22 MED ORDER — ONDANSETRON HCL 4 MG/2ML IJ SOLN
INTRAMUSCULAR | Status: AC
  Filled 2020-02-22: qty 2

## 2020-02-22 MED ORDER — LIDOCAINE 2% (20 MG/ML) 5 ML SYRINGE
INTRAMUSCULAR | Status: AC
  Filled 2020-02-22: qty 5

## 2020-02-22 MED ORDER — LACTATED RINGERS IV SOLN: INTRAVENOUS | Status: DC | PRN

## 2020-02-22 MED ORDER — CLINDAMYCIN PHOSPHATE 900 MG/50ML IV SOLN
900.0000 mg | INTRAVENOUS | Status: AC
  Administered 2020-02-22: 900 mg via INTRAVENOUS
  Filled 2020-02-22: qty 50

## 2020-02-22 MED ORDER — MIDAZOLAM HCL 2 MG/2ML IJ SOLN
INTRAMUSCULAR | Status: AC
  Filled 2020-02-22: qty 2

## 2020-02-22 MED ORDER — PROMETHAZINE HCL 25 MG/ML IJ SOLN: 6.2500 mg | INTRAMUSCULAR | Status: DC | PRN

## 2020-02-22 MED ORDER — FENTANYL CITRATE (PF) 100 MCG/2ML IJ SOLN
INTRAMUSCULAR | Status: AC
  Filled 2020-02-22: qty 2

## 2020-02-22 MED ORDER — PROPOFOL 10 MG/ML IV BOLUS
INTRAVENOUS | Status: AC
  Filled 2020-02-22: qty 20

## 2020-02-22 MED ORDER — LACTATED RINGERS IV SOLN: INTRAVENOUS | Status: DC

## 2020-02-22 MED ORDER — MIDAZOLAM HCL 5 MG/5ML IJ SOLN
INTRAMUSCULAR | Status: DC | PRN
  Administered 2020-02-22: 2 mg via INTRAVENOUS

## 2020-02-22 MED ORDER — SODIUM CHLORIDE 0.9 % IR SOLN
Status: DC | PRN
  Administered 2020-02-22: 3000 mL

## 2020-02-22 MED ORDER — FENTANYL CITRATE (PF) 100 MCG/2ML IJ SOLN
INTRAMUSCULAR | Status: DC | PRN
  Administered 2020-02-22 (×4): 50 ug via INTRAVENOUS

## 2020-02-22 MED ORDER — LIDOCAINE 2% (20 MG/ML) 5 ML SYRINGE
INTRAMUSCULAR | Status: DC | PRN
  Administered 2020-02-22: 100 mg via INTRAVENOUS

## 2020-02-22 MED ORDER — METHOCARBAMOL 500 MG IVPB - SIMPLE MED: 500.0000 mg | Freq: Four times a day (QID) | INTRAVENOUS | Status: DC | PRN

## 2020-02-22 MED ORDER — PHENYLEPHRINE 40 MCG/ML (10ML) SYRINGE FOR IV PUSH (FOR BLOOD PRESSURE SUPPORT)
PREFILLED_SYRINGE | INTRAVENOUS | Status: DC | PRN
  Administered 2020-02-22: 80 ug via INTRAVENOUS

## 2020-02-22 MED ORDER — OXYCODONE HCL 5 MG PO TABS: 5.0000 mg | ORAL_TABLET | Freq: Once | ORAL | Status: DC | PRN

## 2020-02-22 MED ORDER — SCOPOLAMINE 1 MG/3DAYS TD PT72
MEDICATED_PATCH | TRANSDERMAL | Status: AC
  Administered 2020-02-22: 1.5 mg
  Filled 2020-02-22: qty 1

## 2020-02-22 MED ORDER — OXYCODONE HCL 5 MG/5ML PO SOLN: 5.0000 mg | Freq: Once | ORAL | Status: DC | PRN

## 2020-02-22 MED ORDER — DEXAMETHASONE SODIUM PHOSPHATE 10 MG/ML IJ SOLN
8.0000 mg | Freq: Once | INTRAMUSCULAR | Status: AC
  Administered 2020-02-22: 10 mg via INTRAVENOUS

## 2020-02-22 MED ORDER — EPHEDRINE 5 MG/ML INJ
INTRAVENOUS | Status: AC
  Filled 2020-02-22: qty 10

## 2020-02-22 MED ORDER — FENTANYL CITRATE (PF) 250 MCG/5ML IJ SOLN
INTRAMUSCULAR | Status: AC
  Filled 2020-02-22: qty 5

## 2020-02-22 MED ORDER — MEPERIDINE HCL 50 MG/ML IJ SOLN: 6.2500 mg | INTRAMUSCULAR | Status: DC | PRN

## 2020-02-22 MED ORDER — POVIDONE-IODINE 10 % EX SWAB
2.0000 "application " | Freq: Once | CUTANEOUS | Status: AC
  Administered 2020-02-22: 2 via TOPICAL

## 2020-02-22 MED ORDER — CHLORHEXIDINE GLUCONATE 4 % EX LIQD: 60.0000 mL | Freq: Once | CUTANEOUS | Status: DC

## 2020-02-22 MED ORDER — CIPROFLOXACIN HCL 750 MG PO TABS: 750.0000 mg | ORAL_TABLET | Freq: Two times a day (BID) | ORAL | 0 refills | Status: AC

## 2020-02-22 MED ORDER — HYDROMORPHONE HCL 1 MG/ML IJ SOLN: 0.2500 mg | INTRAMUSCULAR | Status: DC | PRN

## 2020-02-22 MED ORDER — METHOCARBAMOL 500 MG PO TABS: 500.0000 mg | ORAL_TABLET | Freq: Four times a day (QID) | ORAL | Status: DC | PRN

## 2020-02-22 MED ORDER — ONDANSETRON HCL 4 MG/2ML IJ SOLN
INTRAMUSCULAR | Status: DC | PRN
  Administered 2020-02-22: 4 mg via INTRAVENOUS

## 2020-02-22 MED ORDER — MIDAZOLAM HCL 2 MG/2ML IJ SOLN: 0.5000 mg | Freq: Once | INTRAMUSCULAR | Status: DC | PRN

## 2020-02-22 SURGICAL SUPPLY — 56 items
BAG SPEC THK2 15X12 ZIP CLS (MISCELLANEOUS) ×1
BAG ZIPLOCK 12X15 (MISCELLANEOUS) ×3 IMPLANT
BNDG CMPR MED 10X6 ELC LF (GAUZE/BANDAGES/DRESSINGS) ×1
BNDG ELASTIC 6X10 VLCR STRL LF (GAUZE/BANDAGES/DRESSINGS) ×2 IMPLANT
BNDG ELASTIC 6X5.8 VLCR STR LF (GAUZE/BANDAGES/DRESSINGS) ×3 IMPLANT
CLOSURE STERI-STRIP 1/2X4 (GAUZE/BANDAGES/DRESSINGS) ×1
CLOSURE WOUND 1/2 X4 (GAUZE/BANDAGES/DRESSINGS) ×2
CLSR STERI-STRIP ANTIMIC 1/2X4 (GAUZE/BANDAGES/DRESSINGS) ×1 IMPLANT
COVER SURGICAL LIGHT HANDLE (MISCELLANEOUS) ×3 IMPLANT
COVER WAND RF STERILE (DRAPES) IMPLANT
CUFF TOURN SGL QUICK 34 (TOURNIQUET CUFF) ×3
CUFF TRNQT CYL 34X4.125X (TOURNIQUET CUFF) ×1 IMPLANT
DRAPE U-SHAPE 47X51 STRL (DRAPES) ×3 IMPLANT
DRSG ADAPTIC 3X8 NADH LF (GAUZE/BANDAGES/DRESSINGS) ×3 IMPLANT
DRSG AQUACEL AG ADV 3.5X10 (GAUZE/BANDAGES/DRESSINGS) ×2 IMPLANT
DRSG PAD ABDOMINAL 8X10 ST (GAUZE/BANDAGES/DRESSINGS) ×9 IMPLANT
DURAPREP 26ML APPLICATOR (WOUND CARE) ×3 IMPLANT
ELECT REM PT RETURN 15FT ADLT (MISCELLANEOUS) ×3 IMPLANT
EVACUATOR 1/8 PVC DRAIN (DRAIN) ×3 IMPLANT
GAUZE SPONGE 2X2 8PLY STRL LF (GAUZE/BANDAGES/DRESSINGS) IMPLANT
GAUZE SPONGE 4X4 12PLY STRL (GAUZE/BANDAGES/DRESSINGS) ×3 IMPLANT
GLOVE BIO SURGEON STRL SZ7 (GLOVE) ×3 IMPLANT
GLOVE BIO SURGEON STRL SZ8 (GLOVE) ×3 IMPLANT
GLOVE BIOGEL PI IND STRL 6.5 (GLOVE) ×1 IMPLANT
GLOVE BIOGEL PI IND STRL 7.0 (GLOVE) ×1 IMPLANT
GLOVE BIOGEL PI IND STRL 8 (GLOVE) ×1 IMPLANT
GLOVE BIOGEL PI INDICATOR 6.5 (GLOVE) ×2
GLOVE BIOGEL PI INDICATOR 7.0 (GLOVE) ×2
GLOVE BIOGEL PI INDICATOR 8 (GLOVE) ×2
GLOVE SURG SS PI 6.5 STRL IVOR (GLOVE) ×3 IMPLANT
GOWN STRL REUS W/TWL LRG LVL3 (GOWN DISPOSABLE) ×9 IMPLANT
HANDPIECE INTERPULSE COAX TIP (DISPOSABLE) ×3
IMMOBILIZER KNEE 20 (SOFTGOODS) ×3
IMMOBILIZER KNEE 20 THIGH 36 (SOFTGOODS) ×1 IMPLANT
KIT TURNOVER KIT A (KITS) IMPLANT
MANIFOLD NEPTUNE II (INSTRUMENTS) ×3 IMPLANT
NS IRRIG 1000ML POUR BTL (IV SOLUTION) ×3 IMPLANT
PACK TOTAL KNEE CUSTOM (KITS) ×3 IMPLANT
PADDING CAST COTTON 6X4 STRL (CAST SUPPLIES) ×4 IMPLANT
PENCIL SMOKE EVACUATOR (MISCELLANEOUS) IMPLANT
PROTECTOR NERVE ULNAR (MISCELLANEOUS) ×3 IMPLANT
SET HNDPC FAN SPRY TIP SCT (DISPOSABLE) ×1 IMPLANT
SPONGE GAUZE 2X2 STER 10/PKG (GAUZE/BANDAGES/DRESSINGS) ×2
STAPLER VISISTAT 35W (STAPLE) ×3 IMPLANT
STRIP CLOSURE SKIN 1/2X4 (GAUZE/BANDAGES/DRESSINGS) ×4 IMPLANT
SUT MNCRL AB 4-0 PS2 18 (SUTURE) ×3 IMPLANT
SUT STRATAFIX 0 PDS 27 VIOLET (SUTURE)
SUT VIC AB 2-0 CT1 27 (SUTURE) ×9
SUT VIC AB 2-0 CT1 TAPERPNT 27 (SUTURE) ×3 IMPLANT
SUTURE STRATFX 0 PDS 27 VIOLET (SUTURE) IMPLANT
SWAB COLLECTION DEVICE MRSA (MISCELLANEOUS) ×3 IMPLANT
SWAB CULTURE ESWAB REG 1ML (MISCELLANEOUS) ×3 IMPLANT
TRAY FOLEY MTR SLVR 16FR STAT (SET/KITS/TRAYS/PACK) ×3 IMPLANT
WATER STERILE IRR 1000ML POUR (IV SOLUTION) ×3 IMPLANT
WRAP KNEE MAXI GEL POST OP (GAUZE/BANDAGES/DRESSINGS) ×4 IMPLANT
YANKAUER SUCT BULB TIP 10FT TU (MISCELLANEOUS) ×3 IMPLANT

## 2020-02-22 NOTE — Anesthesia Procedure Notes (Signed)
Procedure Name: LMA Insertion Date/Time: 02/22/2020 2:34 PM Performed by: Lorelee Market, CRNA Pre-anesthesia Checklist: Patient identified, Emergency Drugs available, Suction available and Patient being monitored Patient Re-evaluated:Patient Re-evaluated prior to induction Oxygen Delivery Method: Circle system utilized Preoxygenation: Pre-oxygenation with 100% oxygen Induction Type: IV induction LMA: LMA with gastric port inserted LMA Size: 4.0 Number of attempts: 1 Placement Confirmation: positive ETCO2 and breath sounds checked- equal and bilateral Tube secured with: Tape Dental Injury: Teeth and Oropharynx as per pre-operative assessment

## 2020-02-22 NOTE — Anesthesia Postprocedure Evaluation (Signed)
Anesthesia Post Note  Patient: [name]  Procedure(s) Performed: Right knee irrigation and debridement (Right Knee)     Patient location during evaluation: Phase II Anesthesia Type: General Level of consciousness: awake and alert, oriented and patient cooperative Pain management: pain level controlled Vital Signs Assessment: post-procedure vital signs reviewed and stable Respiratory status: spontaneous breathing, nonlabored ventilation and respiratory function stable Cardiovascular status: blood pressure returned to baseline and stable Postop Assessment: no apparent nausea or vomiting and adequate PO intake Anesthetic complications: no    Last Vitals:  Vitals:   02/22/20 1615 02/22/20 1625  BP: 131/80 140/80  Pulse: 72 68  Resp: 14 16  Temp: 36.6 C 37.6 C  SpO2: 97% 98%    Last Pain:  Vitals:   02/22/20 1625  TempSrc:   PainSc: 0-No pain                 Jah Alarid,E. Mahin Guardia

## 2020-02-22 NOTE — Anesthesia Preprocedure Evaluation (Addendum)
Anesthesia Evaluation  Patient identified by MRN, date of birth, ID band Patient awake    Reviewed: Allergy & Precautions, NPO status , Patient's Chart, lab work & pertinent test results  History of Anesthesia Complications (+) PONV  Airway Mallampati: II  TM Distance: >3 FB Neck ROM: Full    Dental  (+) Dental Advisory Given, Missing   Pulmonary sleep apnea (no longer uses CPAP) , former smoker,  02/19/2020 SARS coronavirus NEG   breath sounds clear to auscultation       Cardiovascular hypertension, Pt. on medications and Pt. on home beta blockers (-) angina Rhythm:Regular Rate:Normal     Neuro/Psych negative neurological ROS     GI/Hepatic negative GI ROS, Neg liver ROS,   Endo/Other  Morbid obesity  Renal/GU negative Renal ROS     Musculoskeletal  (+) Arthritis , Rheumatoid disorders,    Abdominal (+) + obese,   Peds  Hematology negative hematology ROS (+)   Anesthesia Other Findings   Reproductive/Obstetrics                            Anesthesia Physical Anesthesia Plan  ASA: III  Anesthesia Plan: General   Post-op Pain Management:    Induction: Intravenous  PONV Risk Score and Plan: 3 and Ondansetron, Dexamethasone and Scopolamine patch - Pre-op  Airway Management Planned: LMA  Additional Equipment:   Intra-op Plan:   Post-operative Plan:   Informed Consent: I have reviewed the patients History and Physical, chart, labs and discussed the procedure including the risks, benefits and alternatives for the proposed anesthesia with the patient or authorized representative who has indicated his/her understanding and acceptance.     Dental advisory given  Plan Discussed with: CRNA and Surgeon  Anesthesia Plan Comments:        Anesthesia Quick Evaluation

## 2020-02-22 NOTE — Brief Op Note (Signed)
02/22/2020  3:20 PM  PATIENT:  [name]  59 y.o. male  PRE-OPERATIVE DIAGNOSIS:  right knee recurrent infection  POST-OPERATIVE DIAGNOSIS:  right knee recurrent infection  PROCEDURE:  Procedure(s) with comments: Right knee irrigation and debridement (Right) -  SURGEON:  Surgeon(s) and Role:    Ollen Gross, MD - Primary  PHYSICIAN ASSISTANT:   ASSISTANTS: Arther Abbott, PA-C   ANESTHESIA:   general  EBL:  25 mL   BLOOD ADMINISTERED:none  DRAINS: (Medium) Hemovact drain(s) in the right knee with  Suction Open   LOCAL MEDICATIONS USED:  NONE  COUNTS:  YES  TOURNIQUET: 13 minutes @ 300 mm Hg  DICTATION: .Other Dictation: Dictation Number [id number]  PLAN OF CARE: Discharge to home after PACU  PATIENT DISPOSITION:  PACU - hemodynamically stable.

## 2020-02-22 NOTE — Discharge Instructions (Signed)
 Frank Aluisio, MD [facility] [address] [place], [place] [address] [phone]  POSTOPERATIVE DIRECTIONS  HOME CARE INSTRUCTIONS  . Remove items at home which could result in a fall. This includes throw rugs or furniture in walking pathways.  . ICE to the affected knee as much as tolerated. Icing helps control swelling. If the swelling is well controlled you will be more comfortable and rehab easier. Continue to use ice on the knee for pain and swelling from surgery. You may notice swelling that will progress down to the foot and ankle. This is normal after surgery. Elevate the leg when you are not up walking on it.    . Continue to use the breathing machine which will help keep your temperature down. It is common for your temperature to cycle up and down following surgery, especially at night when you are not up moving around and exerting yourself. The breathing machine keeps your lungs expanded and your temperature down. . Do not place pillow under the operative knee, focus on keeping the knee straight while resting  DIET You may resume your previous home diet once you are discharged from the hospital.  DRESSING / WOUND CARE / SHOWERING . Keep your bulky bandage on for 2 days. On the third post-operative day you may remove the Ace bandage and gauze. There is a waterproof adhesive bandage on your skin which will stay in place until your first follow-up appointment. Once you remove this you will not need to place another bandage . You may begin showering 3 days following surgery, but do not submerge the incision under water.  ACTIVITY For the first 5 days, the key is rest and control of pain and swelling . Do your home exercises twice a day starting on post-operative day 3. On the days you go to physical therapy, just do the home exercises once that day. . You should rest, ice and elevate the leg for 50 minutes out of every hour.  Get up and walk/stretch for 10 minutes per hour. After 5 days you can increase your activity slowly as tolerated. . Walk with your walker as instructed. Use the walker until you are comfortable transitioning to a cane. Walk with the cane in the opposite hand of the operative leg. You may discontinue the cane once you are comfortable and walking steadily. . Avoid periods of inactivity such as sitting longer than an hour when not asleep. This helps prevent blood clots.  . You may discontinue the knee immobilizer once you are able to perform a straight leg raise while lying down. . You may resume a sexual relationship in one month or when given the OK by your doctor.  . You may return to work once you are cleared by your doctor.  . Do not drive a car for 6 weeks or until released by your surgeon.  . Do not drive while taking narcotics.  TED HOSE STOCKINGS Wear the elastic stockings on both legs for three weeks following surgery during the day. You may remove them at night for sleeping.  WEIGHT BEARING Weight bearing as tolerated with assist device (walker, cane, etc) as directed, use it as long as suggested by your surgeon or therapist, typically at least 4-6 weeks.  POSTOPERATIVE CONSTIPATION PROTOCOL Constipation - defined medically as fewer than three stools per week and severe constipation as less than one stool per week.  One of the most common issues patients have following surgery is   constipation.  Even if you have a regular bowel pattern at home, your normal regimen is likely to be disrupted due to multiple reasons following surgery.  Combination of anesthesia, postoperative narcotics, change in appetite and fluid intake all can affect your bowels.  In order to avoid complications following surgery, here are some recommendations in order to help you during your recovery period.  . Colace (docusate) - Pick up an over-the-counter form of Colace or another stool softener and take twice a day as  long as you are requiring postoperative pain medications.  Take with a full glass of water daily.  If you experience loose stools or diarrhea, hold the colace until you stool forms back up. If your symptoms do not get better within 1 week or if they get worse, check with your doctor. . Dulcolax (bisacodyl) - Pick up over-the-counter and take as directed by the product packaging as needed to assist with the movement of your bowels.  Take with a full glass of water.  Use this product as needed if not relieved by Colace only.  . MiraLax (polyethylene glycol) - Pick up over-the-counter to have on hand. MiraLax is a solution that will increase the amount of water in your bowels to assist with bowel movements.  Take as directed and can mix with a glass of water, juice, soda, coffee, or tea. Take if you go more than two days without a movement. Do not use MiraLax more than once per day. Call your doctor if you are still constipated or irregular after using this medication for 7 days in a row.  If you continue to have problems with postoperative constipation, please contact the office for further assistance and recommendations.  If you experience "the worst abdominal pain ever" or develop nausea or vomiting, please contact the office immediatly for further recommendations for treatment.  ITCHING If you experience itching with your medications, try taking only a single pain pill, or even half a pain pill at a time.  You can also use Benadryl over the counter for itching or also to help with sleep.   MEDICATIONS See your medication summary on the "After Visit Summary" that the nursing staff will review with you prior to discharge.  You may have some home medications which will be placed on hold until you complete the course of blood thinner medication.  It is important for you to complete the blood thinner medication as prescribed by your surgeon.  Continue your approved medications as instructed at time of  discharge.  PRECAUTIONS . If you experience chest pain or shortness of breath - call 911 immediately for transfer to the hospital emergency department.  . If you develop a fever greater that 101 F, purulent drainage from wound, increased redness or drainage from wound, foul odor from the wound/dressing, or calf pain - CONTACT YOUR SURGEON.                                                   FOLLOW-UP APPOINTMENTS Make sure you keep all of your appointments after your operation with your surgeon and caregivers. You should call the office at the above phone number and make an appointment for approximately two weeks after the date of your surgery or on the date instructed by your surgeon outlined in the "After Visit Summary".  MAKE SURE YOU:  .   Understand these instructions.  . Get help right away if you are not doing well or get worse.   Pick up stool softner and laxative for home use following surgery while on pain medications. Do not submerge incision under water. Please use good hand washing techniques while changing dressing each day. May shower starting three days after surgery. Please use a clean towel to pat the incision dry following showers. Continue to use ice for pain and swelling after surgery. Do not use any lotions or creams on the incision until instructed by your surgeon.  

## 2020-02-22 NOTE — Transfer of Care (Signed)
Immediate Anesthesia Transfer of Care Note  Patient: [name]  Procedure(s) Performed: Right knee irrigation and debridement (Right Knee)  Patient Location: PACU  Anesthesia Type:General  Level of Consciousness: awake, alert , oriented and patient cooperative  Airway & Oxygen Therapy: Patient Spontanous Breathing and Patient connected to face mask oxygen  Post-op Assessment: Report given to RN, Post -op Vital signs reviewed and stable and Patient moving all extremities  Post vital signs: Reviewed and stable  Last Vitals:  Vitals Value Taken Time  BP    Temp 36.7 C 02/22/20 1538  Pulse 70 02/22/20 1538  Resp 15 02/22/20 1538  SpO2 100 % 02/22/20 1538  Vitals shown include unvalidated device data.  Last Pain:  Vitals:   02/22/20 1246  TempSrc:   PainSc: 6          Complications: No apparent anesthesia complications

## 2020-02-22 NOTE — Op Note (Signed)
NAMERENNY, REMER MEDICAL RECORD [facility]:[id number] ACCOUNT [id number] DATE OF BIRTH:[birth year] FACILITY: [facility] LOCATION: [facility] PHYSICIAN:Mcihael Hinderman Dulcy Fanny, MD  OPERATIVE REPORT  DATE OF PROCEDURE:  02/22/2020  PREOPERATIVE DIAGNOSIS:  Recurrent infection, right knee.  POSTOPERATIVE DIAGNOSIS:  Recurrent infection, right knee.  PROCEDURE:  Right knee irrigation and debridement.  SURGEON:  Ollen Gross, MD  ASSISTANT:  Arther Abbott, PA-C  ANESTHESIA:  General.  ESTIMATED BLOOD LOSS:  Minimal.  DRAIN:  Hemovac x1.  TOURNIQUET TIME:  13 minutes at 300 mmHg.  COMPLICATIONS:  None.  CONDITION:  Stable to recovery.  BRIEF CLINICAL NOTE:  The patient is a 59 year old male, with very complex history in regard to his right knee.  He has had a periprosthetic joint infection and was having recurrent subcutaneous fluid collections.  It is limiting his ability to do  activities he desires and the limiting his ability to get back to work.  He has had this happen in the past and responded well to draining.  He presents now for irrigation, debridement and placement of drains.  PROCEDURE IN DETAIL:  After successful administration of general anesthetic, a tourniquet was placed on the right thigh.  Right lower extremity prepped and draped in the usual sterile fashion.  Extremity was wrapped in Esmarch, tourniquet inflated to 300  mmHg.  Midline incision was made with a 10 blade through subcutaneous tissue and there was a pocket of fluid there.  This fluid was sent for Gram stain, culture and sensitivity.  It is a serous-looking fluid without any purulence.  There was a rind of  tissue present subcutaneously that was housing this fluid.  It was a very smooth surface, unlike the normal subcutaneous tissue.  This sac was removed with electrocautery.  I then washed this area with about 1 L of saline using pulsatile lavage.  I made  a small arthrotomy with a fresh blade and there was a small  amount of fluid in the joint.  The joint was thoroughly irrigated with pulsatile lavage using 2 L of saline.  The joint was closed over a Hemovac drain with running 0 Stratafix suture.   Tourniquet was released at total time of 13 minutes.  Subcutaneous was then closed with interrupted 2-0 Vicryl and skin closed with staples.  Incisions cleaned and dried and a bulky sterile dressing applied.  He was awakened and transported to recovery  in stable condition.  VN/NUANCE  D:02/22/2020 T:02/22/2020 JOB:[id number]

## 2020-02-22 NOTE — Interval H&P Note (Signed)
History and Physical Interval Note:  02/22/2020 12:24 PM  [name]  has presented today for surgery, with the diagnosis of right knee recurrent infection.  The various methods of treatment have been discussed with the patient and family. After consideration of risks, benefits and other options for treatment, the patient has consented to  Procedure(s) with comments: Right knee irrigation and debridement (Right) - as a surgical intervention.  The patient's history has been reviewed, patient examined, no change in status, stable for surgery.  I have reviewed the patient's chart and labs.  Questions were answered to the patient's satisfaction.     [name]

## 2020-02-23 ENCOUNTER — Encounter: Payer: Self-pay | Admitting: *Deleted

## 2020-02-27 LAB — AEROBIC/ANAEROBIC CULTURE W GRAM STAIN (SURGICAL/DEEP WOUND)
Culture: NO GROWTH
Gram Stain: NONE SEEN

## 2020-06-14 NOTE — Progress Notes (Signed)
DUE TO COVID-19 ONLY ONE VISITOR IS ALLOWED TO COME WITH YOU AND STAY IN THE WAITING ROOM ONLY DURING PRE OP AND PROCEDURE DAY OF SURGERY. THE 1 VISITOR  MAY VISIT WITH YOU AFTER SURGERY IN YOUR PRIVATE ROOM DURING VISITING HOURS ONLY!  YOU NEED TO HAVE A COVID 19 TEST ON__9/01/2020 _____ @_______ , THIS TEST MUST BE DONE BEFORE SURGERY,  COVID TESTING SITE [address] [place] , IT IS ON THE RIGHT GOING OUT [address] APPROXIMATELY  2 MINUTES PAST [employer] ON THE RIGHT. ONCE YOUR COVID TEST IS COMPLETED,  PLEASE BEGIN THE QUARANTINE INSTRUCTIONS AS OUTLINED IN YOUR HANDOUT.                [name]  06/14/2020   Your procedure is scheduled on: 06/22/2020    Report to [facility] Main  Entrance   Report to admitting at     AM     Call this number if you have problems the morning of surgery [phone]    REMEMBER: NO  SOLID FOOD CANDY OR GUM AFTER MIDNIGHT. CLEAR LIQUIDS UNTIL         . NOTHING BY MOUTH EXCEPT CLEAR LIQUIDS UNTIL    . PLEASE FINISH ENSURE DRINK PER SURGEON ORDER  WHICH NEEDS TO BE COMPLETED AT      .      CLEAR LIQUID DIET   Foods Allowed                                                                    Coffee and tea, regular and decaf                            Fruit ices (not with fruit pulp)                                      Iced Popsicles                                    Carbonated beverages, regular and diet                                    Cranberry, grape and apple juices Sports drinks like Gatorade Lightly seasoned clear broth or consume(fat free) Sugar, honey syrup ___________________________________________________________________      BRUSH YOUR TEETH MORNING OF SURGERY AND RINSE YOUR MOUTH OUT, NO CHEWING GUM CANDY OR MINTS.     Take these medicines the morning of surgery with A SIP OF WATER:  Cipro, Cardizem, Inhalers as usual and bring, Toprol. aciphe                   x                You may not have any metal on your body including hair pins and              piercings  Do not wear jewelry,  make-up, lotions, powders or perfumes, deodorant             Do not wear nail polish on your fingernails.  Do not shave  48 hours prior to surgery.              Men may shave face and neck.   Do not bring valuables to the hospital. [facility] IS NOT             RESPONSIBLE   FOR VALUABLES.  Contacts, dentures or bridgework may not be worn into surgery.  Leave suitcase in the car. After surgery it may be brought to your room.     Patients discharged the day of surgery will not be allowed to drive home. IF YOU ARE HAVING SURGERY AND GOING HOME THE SAME DAY, YOU MUST HAVE AN ADULT TO DRIVE YOU HOME AND BE WITH YOU FOR 24 HOURS. YOU MAY GO HOME BY TAXI OR UBER OR ORTHERWISE, BUT AN ADULT MUST ACCOMPANY YOU HOME AND STAY WITH YOU FOR 24 HOURS.  Name and phone number of your driver:  Special Instructions: N/A              Please read over the following fact sheets you were given: _____________________________________________________________________  [facility] - Preparing for Surgery Before surgery, you can play an important role.  Because skin is not sterile, your skin needs to be as free of germs as possible.  You can reduce the number of germs on your skin by washing with CHG (chlorahexidine gluconate) soap before surgery.  CHG is an antiseptic cleaner which kills germs and bonds with the skin to continue killing germs even after washing. Please DO NOT use if you have an allergy to CHG or antibacterial soaps.  If your skin becomes reddened/irritated stop using the CHG and inform your nurse when you arrive at Short Stay. Do not shave (including legs and underarms) for at least 48 hours prior to the first CHG shower.  You may shave your face/neck. Please follow these instructions carefully:  1.  Shower with CHG Soap the night before surgery and the  morning of Surgery.  2.  If you choose  to wash your hair, wash your hair first as usual with your  normal  shampoo.  3.  After you shampoo, rinse your hair and body thoroughly to remove the  shampoo.                           4.  Use CHG as you would any other liquid soap.  You can apply chg directly  to the skin and wash                       Gently with a scrungie or clean washcloth.  5.  Apply the CHG Soap to your body ONLY FROM THE NECK DOWN.   Do not use on face/ open                           Wound or open sores. Avoid contact with eyes, ears mouth and genitals (private parts).                       Wash face,  Genitals (private parts) with your normal soap.             6.  Wash thoroughly, paying special attention  to the area where your surgery  will be performed.  7.  Thoroughly rinse your body with warm water from the neck down.  8.  DO NOT shower/wash with your normal soap after using and rinsing off  the CHG Soap.                9.  Pat yourself dry with a clean towel.            10.  Wear clean pajamas.            11.  Place clean sheets on your bed the night of your first shower and do not  sleep with pets. Day of Surgery : Do not apply any lotions/deodorants the morning of surgery.  Please wear clean clothes to the hospital/surgery center.  FAILURE TO FOLLOW THESE INSTRUCTIONS MAY RESULT IN THE CANCELLATION OF YOUR SURGERY PATIENT SIGNATURE_________________________________  NURSE SIGNATURE__________________________________  ________________________________________________________________________             06/18/2020

## 2020-06-15 ENCOUNTER — Encounter (HOSPITAL_COMMUNITY)
Admission: RE | Admit: 2020-06-15 | Discharge: 2020-06-15 | Disposition: A | Discharge status: Home / Self Care | Payer: 59 | Source: Ambulatory Visit | Attending: Orthopedic Surgery | Admitting: Orthopedic Surgery

## 2020-06-15 ENCOUNTER — Other Ambulatory Visit: Payer: Self-pay

## 2020-06-15 ENCOUNTER — Encounter (HOSPITAL_COMMUNITY): Payer: Self-pay

## 2020-06-15 DIAGNOSIS — Z01812 Encounter for preprocedural laboratory examination: Secondary | ICD-10-CM | POA: Diagnosis not present

## 2020-06-15 HISTORY — DX: Edema, unspecified: R60.9

## 2020-06-15 LAB — SURGICAL PCR SCREEN
MRSA, PCR: NEGATIVE
Staphylococcus aureus: NEGATIVE

## 2020-06-15 LAB — APTT: aPTT: 29 seconds (ref 24–36)

## 2020-06-15 LAB — CBC
HCT: 37.7 % — ABNORMAL LOW (ref 39.0–52.0)
Hemoglobin: 11.6 g/dL — ABNORMAL LOW (ref 13.0–17.0)
MCH: 27.2 pg (ref 26.0–34.0)
MCHC: 30.8 g/dL (ref 30.0–36.0)
MCV: 88.3 fL (ref 80.0–100.0)
Platelets: 273 10*3/uL (ref 150–400)
RBC: 4.27 MIL/uL (ref 4.22–5.81)
RDW: 16.4 % — ABNORMAL HIGH (ref 11.5–15.5)
WBC: 6.7 10*3/uL (ref 4.0–10.5)
nRBC: 0 % (ref 0.0–0.2)

## 2020-06-15 LAB — COMPREHENSIVE METABOLIC PANEL
ALT: 16 U/L (ref 0–44)
AST: 20 U/L (ref 15–41)
Albumin: 3.2 g/dL — ABNORMAL LOW (ref 3.5–5.0)
Alkaline Phosphatase: 52 U/L (ref 38–126)
Anion gap: 6 (ref 5–15)
BUN: 21 mg/dL — ABNORMAL HIGH (ref 6–20)
CO2: 25 mmol/L (ref 22–32)
Calcium: 8.3 mg/dL — ABNORMAL LOW (ref 8.9–10.3)
Chloride: 110 mmol/L (ref 98–111)
Creatinine, Ser: 0.89 mg/dL (ref 0.61–1.24)
GFR calc Af Amer: >60 mL/min (ref >60)
GFR calc non Af Amer: >60 mL/min (ref >60)
Glucose, Bld: 92 mg/dL (ref 70–99)
Potassium: 4.2 mmol/L (ref 3.5–5.1)
Sodium: 141 mmol/L (ref 135–145)
Total Bilirubin: 0.4 mg/dL (ref 0.3–1.2)
Total Protein: 6.7 g/dL (ref 6.5–8.1)

## 2020-06-15 LAB — PROTIME-INR
INR: 1.1 (ref 0.8–1.2)
Prothrombin Time: 13.9 seconds (ref 11.4–15.2)

## 2020-06-18 ENCOUNTER — Other Ambulatory Visit (HOSPITAL_COMMUNITY)
Admission: RE | Admit: 2020-06-18 | Discharge: 2020-06-18 | Disposition: A | Discharge status: Home / Self Care | Payer: 59 | Source: Ambulatory Visit | Attending: Vascular Surgery | Admitting: Vascular Surgery

## 2020-06-18 DIAGNOSIS — Z20822 Contact with and (suspected) exposure to covid-19: Secondary | ICD-10-CM | POA: Insufficient documentation

## 2020-06-18 DIAGNOSIS — Z01812 Encounter for preprocedural laboratory examination: Secondary | ICD-10-CM | POA: Diagnosis not present

## 2020-06-18 LAB — SARS CORONAVIRUS 2 (TAT 6-24 HRS): SARS Coronavirus 2: NEGATIVE

## 2020-06-21 MED ORDER — BUPIVACAINE LIPOSOME 1.3 % IJ SUSP
20.0000 mL | INTRAMUSCULAR | Status: DC
  Filled 2020-06-21: qty 20

## 2020-06-21 NOTE — Progress Notes (Signed)
Synovial fluid culture prior to I&D in 02/2020.   CULTURE, AEROBIC BACTERIA       Micro Number:      [id number]   Test Status:       Final   Specimen Source:   KNEE, RIGHT   Specimen Quality:  Adequate   Result:            Moderate growth of Citrobacter koseri                            C.koseri                           ----------------                           INT   MIC    AMOX/CLAVULANATE        S     4    CEFAZOLIN              NR    <=4 **1    CEFEPIME               S     <=1    CEFTRIAXONE            S     <=1    CIPROFLOXACIN       S     <=0.25    ERTAPENEM            S     <=0.5    GENTAMICIN              S     <=1    IMIPENEM                 S     <=0.25    LEVOFLOXACIN            S     <=0.12    PIP/TAZOBACTAM          S     <=4    TOBRAMYCIN              S     <=1    TRIMETHOPRIM/SULFA     S     <=20  S=Susceptible  I=Intermediate  R=Resistant  * = Not Tested NR = Not Reported  **NN = See Therapy Comments   THERAPY COMMENTS      Note 1:     For infections other than uncomplicated UTI     caused by E. coli, K. pneumoniae or P. mirabilis:     Cefazolin is resistant if MIC > or = 8 mcg/mL.     (Distinguishing susceptible versus intermediate     for isolates with MIC < or = 4 mcg/mL requir

## 2020-06-21 NOTE — H&P (Signed)
ADMISSION H&P  Patient is being admitted for right knee resection arthroplasty with  antibiotic spacer.  Subjective:  Chief Complaint: Prosthetic joint infection, right knee   HPI: [name], 59 y.o. male is status post right total knee arthroplasty, with documented low grade prosthetic joint infection. Most recent irrigation and debridement was in 02/2020. Postoperatively, he has had recurrent subcutaneous effusions with aspirations performed routinely in the office. Currently on ciprofloxacin 750 mg BID. Aspirate cultures performed prior to irrigation and debridement performed in 02/2020 are in a separate note. He presents today for resection knee arthroplasty with antibiotic spacer placement.   Patient Active Problem List   Diagnosis Date Noted  . Septic arthritis of knee (HCC) 08/26/2017  . Medication monitoring encounter 11/27/2016  . Allergy history, drug 10/24/2016  . Septic arthritis of knee, right (HCC) 04/25/2016  . 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 Prepatellar bursitis of right knee 09/28/2014  . Infected prepatellar bursa 09/28/2014  . OA (osteoarthritis) of knee 05/21/2014  . 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 Cellulitis and abscess of trunk 02/15/2014    Class: Acute  . Anemia 02/15/2014    Class: Chronic  . Cellulitis of leg, right 01/13/2013    Class: Acute  . Rheumatoid arthritis (HCC) 01/13/2013    Class: Chronic  . Obstructive sleep apnea 01/13/2013    Class: Chronic  . Chronic venous insufficiency 01/13/2013    Class: Chronic    Past Medical History:  Diagnosis Date  . Anemia    hx of  . Asthma   . Cellulitis of lower extremity    "usually RLL; this time it's got up to my lower abdoment" (02/11/2014)-series of antibiotics completed 02-28-14  . Diverticulosis   . Edema    legs  . Hypertension   . Infection of right knee (HCC)   . OSA (obstructive sleep apnea)    does not use cpap   . PONV  (postoperative nausea and vomiting)   . Rheumatoid arthritis (HCC) dx'd ~ 1977  . S/P PICC central line placement 03-16-14   right upper arm remains intact.05-11-14 removed 1 week ago.    Past Surgical History:  Procedure Laterality Date  . EXCISIONAL HEMORRHOIDECTOMY    . EXCISIONAL TOTAL KNEE ARTHROPLASTY WITH ANTIBIOTIC SPACERS Right 03/18/2014   Procedure: RIGHT KNEE RESECTION ARTHROPLASTY WITH ANTIBIOTIC SPACERS;  Surgeon: Loanne Drilling, MD;  Location: [facility];  Service: Orthopedics;  Laterality: Right;  . FOREIGN BODY REMOVAL Left    "BB" removal above left eye-teen yrs.  Marland Kitchen HERNIA REPAIR    . I & D KNEE WITH POLY EXCHANGE Right 04/25/2016   Procedure: RIGHT KNEE IRRIGATION AND DEBRIDEMENT WITH POLY EXCHANGE;  Surgeon: Ollen Gross, MD;  Location: [facility];  Service: Orthopedics;  Laterality: Right;  . I & D KNEE WITH POLY EXCHANGE Right 10/01/2016   Procedure: IRRIGATION AND DEBRIDEMENT KNEE WITH POLY EXCHANGE;  Surgeon: Ollen Gross, MD;  Location: [facility];  Service: Orthopedics;  Laterality: Right;  . I & D KNEE WITH POLY EXCHANGE Right 08/26/2017   Procedure: IRRIGATION AND DEBRIDEMENT KNEE WITH POLY EXCHANGE;  Surgeon: Ollen Gross, MD;  Location: [facility];  Service: Orthopedics;  Laterality: Right;  . I & D KNEE WITH POLY EXCHANGE Right 11/16/2019   Procedure: IRRIGATION AND DEBRIDEMENT KNEE WITH POLY EXCHANGE;  Surgeon: Ollen Gross, MD;  Location: [facility];  Service: Orthopedics;  Laterality: Right;   . I & D KNEE WITH POLY EXCHANGE Right  02/22/2020   Procedure: Right knee irrigation and debridement;  Surgeon: Ollen Gross, MD;  Location: [facility];  Service: Orthopedics;  Laterality: Right;   . INCISION AND DRAINAGE Right 09/28/2014   Procedure: INCISION AND DRAINAGE RIGHT KNEE;  Surgeon: Loanne Drilling, MD;  Location: [facility];  Service: Orthopedics;  Laterality: Right;  . KNEE BURSECTOMY Right 03/13/2017   Procedure: RIGHT KNEE PREPATELLAR BURSECTOMY;  Surgeon: Ollen Gross, MD;  Location: [facility];  Service: Orthopedics;  Laterality: Right;  . picc line placement Right    right upper arm  . 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 REVISION TOTAL KNEE ARTHROPLASTY Right 2012  . SPIGELIAN HERNIA Right 12/04/2015   Procedure: INCARCERATED SPIGELIAN HERNIA REPAIR WITH MESH;  Surgeon: Violeta Gelinas, MD;  Location: [facility] OR;  Service: General;  Laterality: Right;  . TOTAL KNEE ARTHROPLASTY Right 05/21/2014   Procedure: RIGHT KNEE ARTHROPLASTY REINPLANTATION;  Surgeon: Loanne Drilling, MD;  Location: [facility];  Service: Orthopedics;  Laterality: Right;    Prior to Admission medications   Medication Sig Start Date End Date Taking? Authorizing Provider  ciprofloxacin (CIPRO) 750 MG tablet Take 750 mg by mouth 2 (two) times daily.   Yes [provider]  diltiazem (CARDIZEM CD) 240 MG 24 hr capsule Take 240 mg by mouth daily.  11/12/10  Yes [provider]  Fluticasone-Salmeterol (ADVAIR) 250-50 MCG/DOSE AEPB Inhale 1 puff 2 (two) times daily as needed into the lungs (for shortness of breath or wheezing).   Yes [provider]  folic acid (FOLVITE) 1 MG tablet Take 1 mg by mouth daily.   Yes [provider]  ibuprofen (ADVIL) 200 MG tablet Take 600-800 mg by mouth every 8 (eight) hours as needed (pain.).   Yes [provider]  Ipratropium-Albuterol (COMBIVENT RESPIMAT) 20-100 MCG/ACT AERS respimat Inhale 1 puff into the lungs every 6 (six) hours as needed for wheezing or shortness of breath.   Yes [provider]  losartan (COZAAR) 100 MG tablet Take 100 mg by mouth daily.  11/12/10  Yes [provider]  methocarbamol (ROBAXIN) 500 MG tablet Take 1 tablet (500 mg total) by mouth every 6 (six) hours as needed for muscle spasms. 11/16/19  Yes Arianne Klinge L, PA  methotrexate (RHEUMATREX)  2.5 MG tablet Take 20 mg by mouth every Monday. Caution:Chemotherapy. Protect from light.   Yes [provider]  metoprolol succinate (TOPROL-XL) 100 MG 24 hr tablet Take 100 mg by mouth daily. Take with or immediately following a meal.   Yes [provider]  RABEprazole (ACIPHEX) 20 MG tablet Take 20 mg by mouth daily before breakfast.    Yes [provider]  triamterene-hydrochlorothiazide (MAXZIDE-25) 37.5-25 MG tablet Take 1 tablet by mouth daily.   Yes [provider]  oxyCODONE (ROXICODONE) 5 MG immediate release tablet Take 1-2 tablets (5-10 mg total) by mouth every 6 (six) hours as needed for severe pain. 11/16/19 11/15/20  Malay Fantroy, Lyn Hollingshead, PA  traMADol (ULTRAM) 50 MG tablet Take 1-2 tablets (50-100 mg total) by mouth every 6 (six) hours as needed for moderate pain. 11/16/19   Jamas Jaquay, Lyn Hollingshead, PA    Allergies  Allergen Reactions  . Avelox [Moxifloxacin Hcl In Nacl] Hives, Shortness Of Breath and Itching  . Doxycycline Itching  . Rocephin [Ceftriaxone Sodium In Dextrose] Hives and Itching  . Sulfa  Antibiotics Itching  . Vancomycin Hives and Itching  . Erythrocin Rash  . Penicillins Rash  . Sulfasalazine Rash    Social History   Socioeconomic History  . Marital status: Married    Spouse name: Not on file  . Number of children: 1  . Years of education: 25  . Highest education level: Not on file  Occupational History  . Not on file  Tobacco Use  . Smoking status: Former Smoker    Packs/day: 1.00    Years: 3.00    Pack years: 3.00    Types: Cigarettes    Quit date: 03/16/1981    Years since quitting: 39.2  . Smokeless tobacco: Current User    Types: Snuff  . Tobacco comment: "quit smoking cigarettes in the 1980's"  Vaping Use  . Vaping Use: Never used  Substance and Sexual Activity  . Alcohol use: No  . Drug use: No  . Sexual activity: Yes    Partners: Male  Other Topics Concern  . Not on file  Social History Narrative  . Not  on file   Social Determinants of Health   Financial Resource Strain:   . Difficulty of Paying Living Expenses: Not on file  Food Insecurity:   . Worried About Programme researcher, broadcasting/film/video in the Last Year: Not on file  . Ran Out of Food in the Last Year: Not on file  Transportation Needs:   . Lack of Transportation (Medical): Not on file  . Lack of Transportation (Non-Medical): Not on file  Physical Activity:   . Days of Exercise per Week: Not on file  . Minutes of Exercise per Session: Not on file  Stress:   . Feeling of Stress : Not on file  Social Connections:   . Frequency of Communication with Friends and Family: Not on file  . Frequency of Social Gatherings with Friends and Family: Not on file  . Attends Religious Services: Not on file  . Active Member of Clubs or Organizations: Not on file  . Attends Banker Meetings: Not on file  . Marital Status: Not on file  Intimate Partner Violence:   . Fear of Current or Ex-Partner: Not on file  . Emotionally Abused: Not on file  . Physically Abused: Not on file  . Sexually Abused: Not on file      Tobacco Use: High Risk  . Smoking Tobacco Use: Former Smoker  . Smokeless Tobacco Use: Current User   Social History   Substance and Sexual Activity  Alcohol Use No    Family History  Problem Relation Age of Onset  . Colon cancer Mother   . Hypertension Mother   . Diabetes Mellitus II Paternal Grandmother     Review of Systems  Constitutional: Negative for chills and fever.  HENT: Negative for congestion, sore throat and tinnitus.   Eyes: Negative for double vision, photophobia and pain.  Respiratory: Negative for cough, shortness of breath and wheezing.   Cardiovascular: Negative for chest pain, palpitations and orthopnea.  Gastrointestinal: Negative for heartburn, nausea and vomiting.  Genitourinary: Negative for dysuria, frequency and urgency.  Musculoskeletal: Positive for joint pain.  Neurological: Negative  for dizziness, weakness and headaches.    Objective:  Physical Exam: The patient is a pleasant, well-developed male alert and oriented in no apparent distress.    Right knee exam:  There is no effusion.  There is a subcutaneous fluid collection.  The range of motion: 5 to 120 degrees.  The  knee is stable.    The patient's sensation and motor function is intact in their lower extremities. Their distal pulses are 2+. The bilateral calves are soft and nontender.  Assessment/Plan:  Prosthetic joint infection, right total knee  Plan for right knee resection arthroplasty with antibiotic spacer. Patient will require PICC line placement with parental antibiotic management upon discharge. We will consult infectious disease to help with antibiotic management. Risks and benefits of the procedure were discussed with the patient and he elects to proceed.  - Patient was instructed on what medications to stop prior to surgery. - Follow-up visit in 2 weeks with Dr. Lequita Halt - Begin physical therapy following surgery - Pre-operative lab work as pre-surgical testing - Prescriptions will be provided in hospital at time of discharge  Arther Abbott, PA-C Orthopedic Surgery [facility] [place]

## 2020-06-22 ENCOUNTER — Inpatient Hospital Stay (HOSPITAL_COMMUNITY): Payer: 59 | Admitting: Physician Assistant

## 2020-06-22 ENCOUNTER — Inpatient Hospital Stay (HOSPITAL_COMMUNITY): Payer: 59 | Admitting: Anesthesiology

## 2020-06-22 ENCOUNTER — Other Ambulatory Visit: Payer: Self-pay

## 2020-06-22 ENCOUNTER — Encounter (HOSPITAL_COMMUNITY)
Admission: RE | Disposition: A | Discharge status: Home Health | Payer: Self-pay | Source: Home / Self Care | Attending: Orthopedic Surgery

## 2020-06-22 ENCOUNTER — Encounter (HOSPITAL_COMMUNITY): Payer: Self-pay | Admitting: Orthopedic Surgery

## 2020-06-22 ENCOUNTER — Inpatient Hospital Stay: Payer: Self-pay

## 2020-06-22 ENCOUNTER — Inpatient Hospital Stay (HOSPITAL_COMMUNITY)
Admission: RE | Admit: 2020-06-22 | Discharge: 2020-06-23 | DRG: 464 | Disposition: A | Discharge status: Home Health | Payer: 59 | Attending: Orthopedic Surgery | Admitting: Orthopedic Surgery

## 2020-06-22 DIAGNOSIS — M069 Rheumatoid arthritis, unspecified: Secondary | ICD-10-CM | POA: Diagnosis present

## 2020-06-22 DIAGNOSIS — Z800 Family history of malignant neoplasm of digestive organs: Secondary | ICD-10-CM | POA: Diagnosis not present

## 2020-06-22 DIAGNOSIS — T8453XA Infection and inflammatory reaction due to internal right knee prosthesis, initial encounter: Secondary | ICD-10-CM | POA: Diagnosis present

## 2020-06-22 DIAGNOSIS — Z833 Family history of diabetes mellitus: Secondary | ICD-10-CM

## 2020-06-22 DIAGNOSIS — Z20822 Contact with and (suspected) exposure to covid-19: Secondary | ICD-10-CM | POA: Diagnosis present

## 2020-06-22 DIAGNOSIS — M05711 Rheumatoid arthritis with rheumatoid factor of right shoulder without organ or systems involvement: Secondary | ICD-10-CM | POA: Diagnosis not present

## 2020-06-22 DIAGNOSIS — I10 Essential (primary) hypertension: Secondary | ICD-10-CM | POA: Diagnosis present

## 2020-06-22 DIAGNOSIS — Z87891 Personal history of nicotine dependence: Secondary | ICD-10-CM

## 2020-06-22 DIAGNOSIS — G4733 Obstructive sleep apnea (adult) (pediatric): Secondary | ICD-10-CM | POA: Diagnosis present

## 2020-06-22 DIAGNOSIS — T8450XA Infection and inflammatory reaction due to unspecified internal joint prosthesis, initial encounter: Secondary | ICD-10-CM

## 2020-06-22 DIAGNOSIS — Z792 Long term (current) use of antibiotics: Secondary | ICD-10-CM | POA: Diagnosis not present

## 2020-06-22 DIAGNOSIS — M009 Pyogenic arthritis, unspecified: Secondary | ICD-10-CM | POA: Diagnosis not present

## 2020-06-22 DIAGNOSIS — M1710 Unilateral primary osteoarthritis, unspecified knee: Secondary | ICD-10-CM | POA: Diagnosis present

## 2020-06-22 DIAGNOSIS — Z6841 Body Mass Index (BMI) 40.0 and over, adult: Secondary | ICD-10-CM

## 2020-06-22 DIAGNOSIS — M06011 Rheumatoid arthritis without rheumatoid factor, right shoulder: Secondary | ICD-10-CM

## 2020-06-22 DIAGNOSIS — Z880 Allergy status to penicillin: Secondary | ICD-10-CM | POA: Diagnosis not present

## 2020-06-22 DIAGNOSIS — Z8249 Family history of ischemic heart disease and other diseases of the circulatory system: Secondary | ICD-10-CM

## 2020-06-22 DIAGNOSIS — Z881 Allergy status to other antibiotic agents status: Secondary | ICD-10-CM

## 2020-06-22 DIAGNOSIS — M06012 Rheumatoid arthritis without rheumatoid factor, left shoulder: Secondary | ICD-10-CM

## 2020-06-22 DIAGNOSIS — Z79899 Other long term (current) drug therapy: Secondary | ICD-10-CM | POA: Diagnosis not present

## 2020-06-22 DIAGNOSIS — M05712 Rheumatoid arthritis with rheumatoid factor of left shoulder without organ or systems involvement: Secondary | ICD-10-CM | POA: Diagnosis not present

## 2020-06-22 DIAGNOSIS — Y831 Surgical operation with implant of artificial internal device as the cause of abnormal reaction of the patient, or of later complication, without mention of misadventure at the time of the procedure: Secondary | ICD-10-CM | POA: Diagnosis present

## 2020-06-22 HISTORY — PX: EXCISIONAL TOTAL KNEE ARTHROPLASTY WITH ANTIBIOTIC SPACERS: SHX5827

## 2020-06-22 LAB — TYPE AND SCREEN
ABO/RH(D): O POS
Antibody Screen: NEGATIVE

## 2020-06-22 SURGERY — REMOVAL, TOTAL ARTHROPLASTY HARDWARE, KNEE, WITH ANTIBIOTIC SPACER INSERTION
Anesthesia: General | Site: Knee | Laterality: Right

## 2020-06-22 MED ORDER — OXYCODONE HCL 5 MG PO TABS: 5.0000 mg | ORAL_TABLET | ORAL | Status: DC | PRN

## 2020-06-22 MED ORDER — MOMETASONE FURO-FORMOTEROL FUM 200-5 MCG/ACT IN AERO
2.0000 | INHALATION_SPRAY | Freq: Two times a day (BID) | RESPIRATORY_TRACT | Status: DC | PRN
  Filled 2020-06-22: qty 8.8

## 2020-06-22 MED ORDER — CHLORHEXIDINE GLUCONATE CLOTH 2 % EX PADS
6.0000 | MEDICATED_PAD | Freq: Every day | CUTANEOUS | Status: DC
  Administered 2020-06-22 – 2020-06-23 (×2): 6 via TOPICAL

## 2020-06-22 MED ORDER — FENTANYL CITRATE (PF) 100 MCG/2ML IJ SOLN
INTRAMUSCULAR | Status: AC
  Filled 2020-06-22: qty 2

## 2020-06-22 MED ORDER — LOSARTAN POTASSIUM 50 MG PO TABS
100.0000 mg | ORAL_TABLET | Freq: Every day | ORAL | Status: DC
  Administered 2020-06-23: 100 mg via ORAL
  Filled 2020-06-22: qty 2

## 2020-06-22 MED ORDER — ONDANSETRON HCL 4 MG/2ML IJ SOLN
INTRAMUSCULAR | Status: DC | PRN
  Administered 2020-06-22: 4 mg via INTRAVENOUS

## 2020-06-22 MED ORDER — FENTANYL CITRATE (PF) 250 MCG/5ML IJ SOLN
INTRAMUSCULAR | Status: AC
  Filled 2020-06-22: qty 5

## 2020-06-22 MED ORDER — METHOCARBAMOL 500 MG PO TABS
500.0000 mg | ORAL_TABLET | Freq: Four times a day (QID) | ORAL | Status: DC | PRN
  Administered 2020-06-23: 500 mg via ORAL
  Filled 2020-06-22: qty 1

## 2020-06-22 MED ORDER — POLYETHYLENE GLYCOL 3350 17 G PO PACK: 17.0000 g | PACK | Freq: Every day | ORAL | Status: DC | PRN

## 2020-06-22 MED ORDER — FENTANYL CITRATE (PF) 100 MCG/2ML IJ SOLN
INTRAMUSCULAR | Status: DC | PRN
Start: 2020-06-22 — End: 2020-06-22
  Administered 2020-06-22 (×7): 50 ug via INTRAVENOUS

## 2020-06-22 MED ORDER — SODIUM CHLORIDE (PF) 0.9 % IJ SOLN
INTRAMUSCULAR | Status: AC
  Filled 2020-06-22: qty 10

## 2020-06-22 MED ORDER — DEXAMETHASONE SODIUM PHOSPHATE 10 MG/ML IJ SOLN
8.0000 mg | Freq: Once | INTRAMUSCULAR | Status: AC
  Administered 2020-06-22: 8 mg via INTRAVENOUS

## 2020-06-22 MED ORDER — ACETAMINOPHEN 10 MG/ML IV SOLN
INTRAVENOUS | Status: AC
  Filled 2020-06-22: qty 100

## 2020-06-22 MED ORDER — PANTOPRAZOLE SODIUM 40 MG PO TBEC
40.0000 mg | DELAYED_RELEASE_TABLET | Freq: Every day | ORAL | Status: DC
  Administered 2020-06-23: 40 mg via ORAL
  Filled 2020-06-22: qty 1

## 2020-06-22 MED ORDER — ONDANSETRON HCL 4 MG/2ML IJ SOLN
INTRAMUSCULAR | Status: AC
  Filled 2020-06-22: qty 2

## 2020-06-22 MED ORDER — SODIUM CHLORIDE 0.9 % IV SOLN: INTRAVENOUS | Status: DC

## 2020-06-22 MED ORDER — SODIUM CHLORIDE 0.9 % IV SOLN
2.0000 g | Freq: Three times a day (TID) | INTRAVENOUS | Status: DC
  Administered 2020-06-22 – 2020-06-23 (×3): 2 g via INTRAVENOUS
  Filled 2020-06-22 (×4): qty 2

## 2020-06-22 MED ORDER — PROPOFOL 500 MG/50ML IV EMUL
INTRAVENOUS | Status: AC
  Filled 2020-06-22: qty 50

## 2020-06-22 MED ORDER — SCOPOLAMINE 1 MG/3DAYS TD PT72: 1.0000 | MEDICATED_PATCH | TRANSDERMAL | Status: DC

## 2020-06-22 MED ORDER — ASPIRIN EC 325 MG PO TBEC
325.0000 mg | DELAYED_RELEASE_TABLET | Freq: Every day | ORAL | Status: DC
  Administered 2020-06-23: 325 mg via ORAL
  Filled 2020-06-22: qty 1

## 2020-06-22 MED ORDER — ONDANSETRON HCL 4 MG PO TABS: 4.0000 mg | ORAL_TABLET | Freq: Four times a day (QID) | ORAL | Status: DC | PRN

## 2020-06-22 MED ORDER — FENTANYL CITRATE (PF) 100 MCG/2ML IJ SOLN
25.0000 ug | INTRAMUSCULAR | Status: DC | PRN
  Administered 2020-06-22: 50 ug via INTRAVENOUS

## 2020-06-22 MED ORDER — LIDOCAINE 2% (20 MG/ML) 5 ML SYRINGE
INTRAMUSCULAR | Status: DC | PRN
  Administered 2020-06-22: 100 mg via INTRAVENOUS

## 2020-06-22 MED ORDER — EPHEDRINE SULFATE-NACL 50-0.9 MG/10ML-% IV SOSY
PREFILLED_SYRINGE | INTRAVENOUS | Status: DC | PRN
  Administered 2020-06-22 (×4): 10 mg via INTRAVENOUS

## 2020-06-22 MED ORDER — PHENYLEPHRINE HCL (PRESSORS) 10 MG/ML IV SOLN
INTRAVENOUS | Status: AC
  Filled 2020-06-22: qty 1

## 2020-06-22 MED ORDER — TRAMADOL HCL 50 MG PO TABS
50.0000 mg | ORAL_TABLET | Freq: Four times a day (QID) | ORAL | Status: DC | PRN
  Administered 2020-06-22 (×2): 100 mg via ORAL
  Administered 2020-06-23: 50 mg via ORAL
  Filled 2020-06-22: qty 1
  Filled 2020-06-22 (×2): qty 2

## 2020-06-22 MED ORDER — FLEET ENEMA 7-19 GM/118ML RE ENEM: 1.0000 | ENEMA | Freq: Once | RECTAL | Status: DC | PRN

## 2020-06-22 MED ORDER — TRANEXAMIC ACID-NACL 1000-0.7 MG/100ML-% IV SOLN
INTRAVENOUS | Status: AC
  Filled 2020-06-22: qty 100

## 2020-06-22 MED ORDER — MORPHINE SULFATE (PF) 2 MG/ML IV SOLN: 1.0000 mg | INTRAVENOUS | Status: DC | PRN

## 2020-06-22 MED ORDER — MIDAZOLAM HCL 2 MG/2ML IJ SOLN
INTRAMUSCULAR | Status: AC
  Filled 2020-06-22: qty 2

## 2020-06-22 MED ORDER — CLINDAMYCIN PHOSPHATE 900 MG/50ML IV SOLN
INTRAVENOUS | Status: AC
  Filled 2020-06-22: qty 50

## 2020-06-22 MED ORDER — SODIUM CHLORIDE 0.9 % IR SOLN
Status: DC | PRN
  Administered 2020-06-22: 4000 mL

## 2020-06-22 MED ORDER — TRANEXAMIC ACID-NACL 1000-0.7 MG/100ML-% IV SOLN
1000.0000 mg | INTRAVENOUS | Status: AC
  Administered 2020-06-22: 1000 mg via INTRAVENOUS

## 2020-06-22 MED ORDER — PROPOFOL 10 MG/ML IV BOLUS
INTRAVENOUS | Status: AC
  Filled 2020-06-22: qty 20

## 2020-06-22 MED ORDER — MENTHOL 3 MG MT LOZG: 1.0000 | LOZENGE | OROMUCOSAL | Status: DC | PRN

## 2020-06-22 MED ORDER — CIPROFLOXACIN HCL 500 MG PO TABS: 750.0000 mg | ORAL_TABLET | Freq: Two times a day (BID) | ORAL | Status: DC

## 2020-06-22 MED ORDER — PROPOFOL 10 MG/ML IV BOLUS
INTRAVENOUS | Status: DC | PRN
  Administered 2020-06-22: 200 mg via INTRAVENOUS

## 2020-06-22 MED ORDER — METHOCARBAMOL 500 MG IVPB - SIMPLE MED
500.0000 mg | Freq: Four times a day (QID) | INTRAVENOUS | Status: DC | PRN
  Filled 2020-06-22: qty 50

## 2020-06-22 MED ORDER — SODIUM CHLORIDE (PF) 0.9 % IJ SOLN
INTRAMUSCULAR | Status: AC
  Filled 2020-06-22: qty 50

## 2020-06-22 MED ORDER — METOPROLOL SUCCINATE ER 50 MG PO TB24
100.0000 mg | ORAL_TABLET | Freq: Every day | ORAL | Status: DC
  Administered 2020-06-23: 100 mg via ORAL
  Filled 2020-06-22: qty 2

## 2020-06-22 MED ORDER — FENTANYL CITRATE (PF) 100 MCG/2ML IJ SOLN
INTRAMUSCULAR | Status: AC
  Administered 2020-06-22: 50 ug via INTRAVENOUS
  Filled 2020-06-22: qty 2

## 2020-06-22 MED ORDER — DEXAMETHASONE SODIUM PHOSPHATE 10 MG/ML IJ SOLN
10.0000 mg | Freq: Once | INTRAMUSCULAR | Status: AC
  Administered 2020-06-23: 10 mg via INTRAVENOUS
  Filled 2020-06-22: qty 1

## 2020-06-22 MED ORDER — EPHEDRINE 5 MG/ML INJ
INTRAVENOUS | Status: AC
  Filled 2020-06-22: qty 20

## 2020-06-22 MED ORDER — DOCUSATE SODIUM 100 MG PO CAPS
100.0000 mg | ORAL_CAPSULE | Freq: Two times a day (BID) | ORAL | Status: DC
  Administered 2020-06-22 – 2020-06-23 (×2): 100 mg via ORAL
  Filled 2020-06-22 (×2): qty 1

## 2020-06-22 MED ORDER — DIPHENHYDRAMINE HCL 12.5 MG/5ML PO ELIX: 12.5000 mg | ORAL_SOLUTION | ORAL | Status: DC | PRN

## 2020-06-22 MED ORDER — ACETAMINOPHEN 500 MG PO TABS: 1000.0000 mg | ORAL_TABLET | Freq: Once | ORAL | Status: DC

## 2020-06-22 MED ORDER — ROPIVACAINE HCL 5 MG/ML IJ SOLN
INTRAMUSCULAR | Status: DC | PRN
  Administered 2020-06-22: 20 mL via PERINEURAL

## 2020-06-22 MED ORDER — CLINDAMYCIN PHOSPHATE 900 MG/50ML IV SOLN
900.0000 mg | INTRAVENOUS | Status: AC
  Administered 2020-06-22: 900 mg via INTRAVENOUS

## 2020-06-22 MED ORDER — STERILE WATER FOR IRRIGATION IR SOLN
Status: DC | PRN
  Administered 2020-06-22: 2000 mL

## 2020-06-22 MED ORDER — LACTATED RINGERS IV SOLN: INTRAVENOUS | Status: DC

## 2020-06-22 MED ORDER — LIDOCAINE 2% (20 MG/ML) 5 ML SYRINGE
INTRAMUSCULAR | Status: AC
  Filled 2020-06-22: qty 5

## 2020-06-22 MED ORDER — SODIUM CHLORIDE 0.9% FLUSH: 10.0000 mL | INTRAVENOUS | Status: DC | PRN

## 2020-06-22 MED ORDER — PHENOL 1.4 % MT LIQD: 1.0000 | OROMUCOSAL | Status: DC | PRN

## 2020-06-22 MED ORDER — METHOCARBAMOL 500 MG IVPB - SIMPLE MED
INTRAVENOUS | Status: AC
  Administered 2020-06-22: 500 mg via INTRAVENOUS
  Filled 2020-06-22: qty 50

## 2020-06-22 MED ORDER — POVIDONE-IODINE 10 % EX SWAB
2.0000 "application " | Freq: Once | CUTANEOUS | Status: AC
  Administered 2020-06-22: 2 via TOPICAL

## 2020-06-22 MED ORDER — TRIAMTERENE-HCTZ 37.5-25 MG PO TABS
1.0000 | ORAL_TABLET | Freq: Every day | ORAL | Status: DC
  Administered 2020-06-23: 1 via ORAL
  Filled 2020-06-22: qty 1

## 2020-06-22 MED ORDER — SCOPOLAMINE 1 MG/3DAYS TD PT72
MEDICATED_PATCH | TRANSDERMAL | Status: AC
  Filled 2020-06-22: qty 1

## 2020-06-22 MED ORDER — ACETAMINOPHEN 500 MG PO TABS
1000.0000 mg | ORAL_TABLET | Freq: Four times a day (QID) | ORAL | Status: AC
  Administered 2020-06-22 – 2020-06-23 (×4): 1000 mg via ORAL
  Filled 2020-06-22 (×4): qty 2

## 2020-06-22 MED ORDER — IPRATROPIUM-ALBUTEROL 0.5-2.5 (3) MG/3ML IN SOLN: 3.0000 mL | Freq: Four times a day (QID) | RESPIRATORY_TRACT | Status: DC | PRN

## 2020-06-22 MED ORDER — METOCLOPRAMIDE HCL 5 MG PO TABS: 5.0000 mg | ORAL_TABLET | Freq: Three times a day (TID) | ORAL | Status: DC | PRN

## 2020-06-22 MED ORDER — ONDANSETRON HCL 4 MG/2ML IJ SOLN: 4.0000 mg | Freq: Four times a day (QID) | INTRAMUSCULAR | Status: DC | PRN

## 2020-06-22 MED ORDER — AMISULPRIDE (ANTIEMETIC) 5 MG/2ML IV SOLN: 10.0000 mg | Freq: Once | INTRAVENOUS | Status: DC | PRN

## 2020-06-22 MED ORDER — METHOTREXATE 2.5 MG PO TABS: 20.0000 mg | ORAL_TABLET | ORAL | Status: DC

## 2020-06-22 MED ORDER — DEXAMETHASONE SODIUM PHOSPHATE 10 MG/ML IJ SOLN
INTRAMUSCULAR | Status: AC
  Filled 2020-06-22: qty 1

## 2020-06-22 MED ORDER — METOCLOPRAMIDE HCL 5 MG/ML IJ SOLN: 5.0000 mg | Freq: Three times a day (TID) | INTRAMUSCULAR | Status: DC | PRN

## 2020-06-22 MED ORDER — DILTIAZEM HCL ER COATED BEADS 240 MG PO CP24
240.0000 mg | ORAL_CAPSULE | Freq: Every day | ORAL | Status: DC
  Administered 2020-06-23: 240 mg via ORAL
  Filled 2020-06-22: qty 1

## 2020-06-22 MED ORDER — ACETAMINOPHEN 10 MG/ML IV SOLN
1000.0000 mg | Freq: Four times a day (QID) | INTRAVENOUS | Status: DC
  Administered 2020-06-22: 1000 mg via INTRAVENOUS

## 2020-06-22 MED ORDER — BISACODYL 10 MG RE SUPP: 10.0000 mg | Freq: Every day | RECTAL | Status: DC | PRN

## 2020-06-22 MED ORDER — MIDAZOLAM HCL 5 MG/5ML IJ SOLN
INTRAMUSCULAR | Status: DC | PRN
  Administered 2020-06-22 (×2): 1 mg via INTRAVENOUS

## 2020-06-22 MED ORDER — CLONIDINE HCL (ANALGESIA) 100 MCG/ML EP SOLN
EPIDURAL | Status: DC | PRN
  Administered 2020-06-22: 50 ug

## 2020-06-22 SURGICAL SUPPLY — 58 items
BAG SPEC THK2 15X12 ZIP CLS (MISCELLANEOUS) ×1
BAG ZIPLOCK 12X15 (MISCELLANEOUS) ×3 IMPLANT
BLADE SAG 18X100X1.27 (BLADE) ×3 IMPLANT
BLADE SAW SGTL 11.0X1.19X90.0M (BLADE) ×3 IMPLANT
BNDG CMPR MED 10X6 ELC LF (GAUZE/BANDAGES/DRESSINGS) ×1
BNDG ELASTIC 6X10 VLCR STRL LF (GAUZE/BANDAGES/DRESSINGS) ×2 IMPLANT
BNDG ELASTIC 6X5.8 VLCR STR LF (GAUZE/BANDAGES/DRESSINGS) ×3 IMPLANT
BONE CEMENT GENTAMICIN (Cement) ×15 IMPLANT
CEMENT BONE GENTAMICIN 40 (Cement) ×3 IMPLANT
CLOSURE WOUND 1/2 X4 (GAUZE/BANDAGES/DRESSINGS) ×1
COVER SURGICAL LIGHT HANDLE (MISCELLANEOUS) ×3 IMPLANT
COVER WAND RF STERILE (DRAPES) IMPLANT
CUFF TOURN SGL QUICK 34 (TOURNIQUET CUFF) ×6
CUFF TRNQT CYL 34X4.125X (TOURNIQUET CUFF) ×2 IMPLANT
DRAPE POUCH INSTRU U-SHP 10X18 (DRAPES) ×3 IMPLANT
DRAPE U-SHAPE 47X51 STRL (DRAPES) ×3 IMPLANT
DRSG ADAPTIC 3X8 NADH LF (GAUZE/BANDAGES/DRESSINGS) ×3 IMPLANT
DRSG AQUACEL AG ADV 3.5X10 (GAUZE/BANDAGES/DRESSINGS) ×2 IMPLANT
DRSG PAD ABDOMINAL 8X10 ST (GAUZE/BANDAGES/DRESSINGS) ×3 IMPLANT
DURAPREP 26ML APPLICATOR (WOUND CARE) ×3 IMPLANT
ELECT REM PT RETURN 15FT ADLT (MISCELLANEOUS) ×3 IMPLANT
EVACUATOR 1/8 PVC DRAIN (DRAIN) ×3 IMPLANT
GAUZE SPONGE 2X2 8PLY STRL LF (GAUZE/BANDAGES/DRESSINGS) IMPLANT
GAUZE SPONGE 4X4 12PLY STRL (GAUZE/BANDAGES/DRESSINGS) ×3 IMPLANT
GLOVE BIO SURGEON STRL SZ7.5 (GLOVE) ×3 IMPLANT
GLOVE BIO SURGEON STRL SZ8 (GLOVE) ×6 IMPLANT
GLOVE BIOGEL PI IND STRL 7.0 (GLOVE) ×1 IMPLANT
GLOVE BIOGEL PI IND STRL 8 (GLOVE) ×1 IMPLANT
GLOVE BIOGEL PI INDICATOR 7.0 (GLOVE) ×2
GLOVE BIOGEL PI INDICATOR 8 (GLOVE) ×2
GLOVE SURG SS PI 7.0 STRL IVOR (GLOVE) ×3 IMPLANT
GOWN STRL REUS W/TWL LRG LVL3 (GOWN DISPOSABLE) ×9 IMPLANT
HANDPIECE INTERPULSE COAX TIP (DISPOSABLE) ×3
IMMOBILIZER KNEE 20 (SOFTGOODS) ×3
IMMOBILIZER KNEE 20 THIGH 36 (SOFTGOODS) IMPLANT
KIT BASIN OR (CUSTOM PROCEDURE TRAY) ×3 IMPLANT
KIT TURNOVER KIT A (KITS) IMPLANT
MANIFOLD NEPTUNE II (INSTRUMENTS) ×3 IMPLANT
NS IRRIG 1000ML POUR BTL (IV SOLUTION) ×6 IMPLANT
PACK TOTAL KNEE CUSTOM (KITS) ×3 IMPLANT
PADDING CAST COTTON 6X4 STRL (CAST SUPPLIES) ×4 IMPLANT
PENCIL SMOKE EVACUATOR (MISCELLANEOUS) IMPLANT
PROTECTOR NERVE ULNAR (MISCELLANEOUS) ×3 IMPLANT
SET HNDPC FAN SPRY TIP SCT (DISPOSABLE) ×1 IMPLANT
SPONGE GAUZE 2X2 STER 10/PKG (GAUZE/BANDAGES/DRESSINGS) ×2
STAPLER VISISTAT 35W (STAPLE) ×3 IMPLANT
STRIP CLOSURE SKIN 1/2X4 (GAUZE/BANDAGES/DRESSINGS) ×1 IMPLANT
SUT STRATAFIX 0 PDS 27 VIOLET (SUTURE) ×3
SUT VIC AB 2-0 CT1 27 (SUTURE) ×9
SUT VIC AB 2-0 CT1 TAPERPNT 27 (SUTURE) ×3 IMPLANT
SUTURE STRATFX 0 PDS 27 VIOLET (SUTURE) ×1 IMPLANT
SWAB COLLECTION DEVICE MRSA (MISCELLANEOUS) ×3 IMPLANT
SWAB CULTURE ESWAB REG 1ML (MISCELLANEOUS) ×3 IMPLANT
TOWER CARTRIDGE SMART MIX (DISPOSABLE) ×3 IMPLANT
TRAY FOLEY MTR SLVR 16FR STAT (SET/KITS/TRAYS/PACK) ×3 IMPLANT
WATER STERILE IRR 1000ML POUR (IV SOLUTION) ×3 IMPLANT
WRAP KNEE MAXI GEL POST OP (GAUZE/BANDAGES/DRESSINGS) ×4 IMPLANT
YANKAUER SUCT BULB TIP 10FT TU (MISCELLANEOUS) IMPLANT

## 2020-06-22 NOTE — Anesthesia Preprocedure Evaluation (Signed)
Anesthesia Evaluation  Patient identified by MRN, date of birth, ID band Patient awake    Reviewed: Allergy & Precautions, NPO status , Patient's Chart, lab work & pertinent test results  History of Anesthesia Complications (+) PONV  Airway Mallampati: II  TM Distance: >3 FB Neck ROM: Full    Dental  (+) Dental Advisory Given, Missing   Pulmonary sleep apnea (no longer uses CPAP) , former smoker,  02/19/2020 SARS coronavirus NEG   breath sounds clear to auscultation       Cardiovascular hypertension, Pt. on medications and Pt. on home beta blockers (-) angina Rhythm:Regular Rate:Normal     Neuro/Psych negative neurological ROS     GI/Hepatic negative GI ROS, Neg liver ROS,   Endo/Other  Morbid obesity  Renal/GU negative Renal ROS     Musculoskeletal  (+) Arthritis , Rheumatoid disorders,    Abdominal (+) + obese,   Peds  Hematology  (+) anemia ,   Anesthesia Other Findings   Reproductive/Obstetrics                             Lab Results  Component Value Date   WBC 6.7 06/15/2020   HGB 11.6 (L) 06/15/2020   HCT 37.7 (L) 06/15/2020   MCV 88.3 06/15/2020   PLT 273 06/15/2020   Lab Results  Component Value Date   CREATININE 0.89 06/15/2020   BUN 21 (H) 06/15/2020   NA 141 06/15/2020   K 4.2 06/15/2020   CL 110 06/15/2020   CO2 25 06/15/2020    Anesthesia Physical  Anesthesia Plan  ASA: III  Anesthesia Plan: General   Post-op Pain Management:  Regional for Post-op pain   Induction: Intravenous  PONV Risk Score and Plan: 3 and Ondansetron, Dexamethasone and Scopolamine patch - Pre-op  Airway Management Planned: LMA  Additional Equipment:   Intra-op Plan:   Post-operative Plan: Extubation in OR  Informed Consent: I have reviewed the patients History and Physical, chart, labs and discussed the procedure including the risks, benefits and alternatives for the proposed  anesthesia with the patient or authorized representative who has indicated his/her understanding and acceptance.     Dental advisory given  Plan Discussed with: CRNA and Surgeon  Anesthesia Plan Comments:         Anesthesia Quick Evaluation

## 2020-06-22 NOTE — Evaluation (Signed)
Physical Therapy Evaluation Patient Details Name: [name] MRN: [id number] DOB: 02-12-1961 Today's Date: 06/22/2020   History of Present Illness  Patient is 59 y.o. male s/p Rt TKR (resection and antibiotic spacer) on 06/22/20 with PMH significant for HTN, asthma, anemia, RA, Bil TKA with Rt in 93 and revision in 2015, Lt in 2008, and Bil THA.    Clinical Impression  [name] is a 59 y.o. male POD 0 s/p Rt TKR. Patient reports independence with RW for mobility prior to surgery. Patient is now limited by functional impairments (see PT problem list below) and requires min assist for transfers and gait with RW. Patient was able to ambulate ~12 feet with RW and min assist. Patient instructed in exercise to facilitate circulation to manage edema and prevent blood clots. Patient will benefit from continued skilled PT interventions to address impairments and progress towards PLOF. Acute PT will follow to progress mobility and stair training in preparation for safe discharge home.     Follow Up Recommendations Follow surgeon's recommendation for DC plan and follow-up therapies    Equipment Recommendations  Crutches (pt has all other equipment, uses crutches when more mobile)    Recommendations for Other Services       Precautions / Restrictions Precautions Precautions: Fall Required Braces or Orthoses: Knee Immobilizer - Right Restrictions Weight Bearing Restrictions: No Other Position/Activity Restrictions: WBAT      Mobility  Bed Mobility Overal bed mobility: Needs Assistance Bed Mobility: Supine to Sit     Supine to sit: Min guard;HOB elevated;Min assist     General bed mobility comments: pt using bed rail to raise trunk. light assist required for bring LE off EOB.  Transfers Overall transfer level: Needs assistance Equipment used: Rolling walker (2 wheeled) Transfers: Sit to/from Stand Sit to Stand: Min assist;From elevated surface         General transfer comment:  pt using bil UE on RW for power up, light assist for rise, pt steady in standing.   Ambulation/Gait Ambulation/Gait assistance: Min assist Gait Distance (Feet): 12 Feet Assistive device: Rolling walker (2 wheeled) Gait Pattern/deviations: Step-to pattern;Decreased stance time - right;Decreased weight shift to right;Antalgic Gait velocity: decr   General Gait Details: cues for safe step pattern and to move Rt LE closer to walker intermittently. no overt LOB noted, pt with immobilizer on Rt LE.  Stairs            Wheelchair Mobility    Modified Rankin (Stroke Patients Only)       Balance Overall balance assessment: Needs assistance Sitting-balance support: Feet supported Sitting balance-Leahy Scale: Good     Standing balance support: During functional activity;Bilateral upper extremity supported Standing balance-Leahy Scale: Fair Standing balance comment: reliant on external support for gait              Pertinent Vitals/Pain Pain Assessment: 0-10 Pain Score: 5  Pain Location: Rt knee Pain Descriptors / Indicators: Discomfort;Aching;Sore Pain Intervention(s): Monitored during session;Limited activity within patient's tolerance;Repositioned;Ice applied    Home Living Family/patient expects to be discharged to:: Private residence Living Arrangements: Spouse/significant other Available Help at Discharge: Family Type of Home: House Home Access: Stairs to enter Entrance Stairs-Rails: Right Entrance Stairs-Number of Steps: 4 at front, 3 at the side door Home Layout: One level Home Equipment: Walker - 2 wheels;Wheelchair - manual;Bedside commode;Walker - 4 wheels;Shower seat - built in;Hand held shower head;Grab bars - tub/shower Additional Comments: plans to put a ramp up next week  Prior Function Level of Independence: Independent with assistive device(s)         Comments: pt has been using RW for mobilizing prior to this surgery. 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     Hand Dominance   Dominant Hand: Right    Extremity/Trunk Assessment   Upper Extremity Assessment Upper Extremity Assessment: Overall WFL for tasks assessed    Lower Extremity Assessment Lower Extremity Assessment: RLE deficits/detail RLE Deficits / Details: limited quad activation, pt limited by pain RLE Sensation: WNL RLE Coordination: WNL    Cervical / Trunk Assessment Cervical / Trunk Assessment: Normal;Other exceptions Cervical / Trunk Exceptions: large habitus  Communication   Communication: No difficulties  Cognition Arousal/Alertness: Awake/alert Behavior During Therapy: WFL for tasks assessed/performed Overall Cognitive Status: Within Functional Limits for tasks assessed               General Comments      Exercises Total Joint Exercises Ankle Circles/Pumps: AROM;Both;20 reps;Seated   Assessment/Plan    PT Assessment Patient needs continued PT services  PT Problem List Decreased strength;Decreased range of motion;Decreased activity tolerance;Decreased balance;Decreased mobility;Decreased knowledge of use of DME;Decreased safety awareness;Decreased knowledge of precautions;Pain;Obesity       PT Treatment Interventions DME instruction;Stair training;Functional mobility training;Gait training;Therapeutic activities;Therapeutic exercise;Balance training;Patient/family education    PT Goals (Current goals can be found in the Care Plan section)  Acute Rehab PT Goals Patient Stated Goal: get home for the weekend PT Goal Formulation: With patient Time For Goal Achievement: 06/29/20 Potential to Achieve Goals: Good    Frequency 7X/week   Barriers to discharge        AM-PAC PT "6 Clicks" Mobility  Outcome Measure Help needed turning from your back to your side while in a flat bed without using bedrails?: A Little Help needed moving from lying on your back to sitting on the side of  a flat bed without using bedrails?: A Little Help needed moving to and from a bed to a chair (including a wheelchair)?: A Little Help needed standing up from a chair using your arms (e.g., wheelchair or bedside chair)?: A Little Help needed to walk in hospital room?: A Little Help needed climbing 3-5 steps with a railing? : A Little 6 Click Score: 18    End of Session Equipment Utilized During Treatment: Gait belt;Right knee immobilizer Activity Tolerance: Patient tolerated treatment well Patient left: in chair;with call bell/phone within reach;with chair alarm set Nurse Communication: Mobility status PT Visit Diagnosis: Muscle weakness (generalized) (M62.81);Difficulty in walking, not elsewhere classified (R26.2);Pain Pain - Right/Left: Right Pain - part of body: Knee    Time: 8676-7209 PT Time Calculation (min) (ACUTE ONLY): 29 min   Charges:   PT Evaluation $PT Eval Low Complexity: 1 Low PT Treatments $Gait Training: 8-22 mins      Wynn Maudlin, DPT Acute Rehabilitation Services  Office [phone] Pager [phone]  06/22/2020 6:39 PM

## 2020-06-22 NOTE — Consult Note (Signed)
Date of Admission:  06/22/2020          Reason for Consult: Recurrent infected right TKA    Referring Provider: Dr. Despina Hick   Assessment:  1. Recurrent infected TKA 2. RA 3. PCN allergy but tolerated amoxicillin without problems 4. Vancomycin allergy   Plan:  1. Start cefepime 2. Find out sensis on Serratia from 2015 [facility] 3. PICC line 4. 6 weeks of IV therapy with followup 5. F/u intra-operative cultures  Active Problems:   Septic arthritis of knee, right (HCC)   Scheduled Meds: . acetaminophen  1,000 mg Oral Q6H  . [START ON 06/23/2020] aspirin EC  325 mg Oral Q breakfast  . ciprofloxacin  750 mg Oral BID  . [START ON 06/23/2020] dexamethasone (DECADRON) injection  10 mg Intravenous Once  . [START ON 06/23/2020] diltiazem  240 mg Oral Daily  . docusate sodium  100 mg Oral BID  . [START ON 06/23/2020] losartan  100 mg Oral Daily  . [START ON 06/23/2020] metoprolol succinate  100 mg Oral Daily  . [START ON 06/23/2020] pantoprazole  40 mg Oral Daily  . scopolamine      . [START ON 06/23/2020] triamterene-hydrochlorothiazide  1 tablet Oral Daily   Continuous Infusions: . sodium chloride 100 mL/hr at 06/22/20 1236  . lactated ringers 75 mL/hr at 06/22/20 0816  . methocarbamol (ROBAXIN) IV 500 mg (06/22/20 1100)   PRN Meds:.bisacodyl, diphenhydrAMINE, ipratropium-albuterol, menthol-cetylpyridinium **OR** phenol, methocarbamol **OR** methocarbamol (ROBAXIN) IV, metoCLOPramide **OR** metoCLOPramide (REGLAN) injection, mometasone-formoterol, morphine injection, ondansetron **OR** ondansetron (ZOFRAN) IV, oxyCODONE, polyethylene glycol, sodium phosphate, traMADol  HPI: [name] is a 59 y.o. male y.o.malewith history of RA on immunosuppressive medications and TKA done in the 1990s with revision in 2012 and serratia PJI in 2015--when I first saw him and  treated with ceftriaxone x 6 weeks after resection arthroplasty. 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 Ninetta Lights after developing a vancomycin allergy. He completed 6 weeks and stopped after that.  With allergy history no oral continuation therapy was done.   Then  He came back in December 2017 with knee pain, swelling c/w PJI again. No culture grew and treated for 6 more weeks with daptomycin. Dr. Luciana Axe followed him closely and placed him on oral augmentin in February of 2018 to complete an additional 3 months of therapy.  He had a further I and D with polyexchange in November of 2018.  In February 2021  I and D with polyexchange   He had an aspirate of his knee that grew Citrobacter Koseri that was fairly S.       Then another I and D with polyexchange in May of 2021  He has been on protracted oral ciprofloxacin but was having loosening of his prosthesis and there was concern for progressive infection. He underwent I and D with resection arthroplasty today with cultures taken in the OR.  He is seen by Dr. Gavin Potters for his RA and only on MTX         Review of Systems: Review of Systems  Constitutional: Negative for chills, fever, malaise/fatigue and weight loss.  HENT: Negative for congestion and sore throat.   Eyes: Negative for blurred vision and photophobia.  Respiratory: Negative for cough, shortness of breath and wheezing.   Cardiovascular: Negative for chest pain, palpitations and leg swelling.  Gastrointestinal: Negative for abdominal pain, blood in stool, constipation,  diarrhea, heartburn, melena, nausea and vomiting.  Genitourinary: Negative for dysuria, flank pain and hematuria.  Musculoskeletal: Positive for joint pain and myalgias. Negative for back pain and falls.  Skin: Negative for itching and rash.  Neurological: Negative for dizziness, focal weakness, loss of consciousness, weakness and headaches.  Endo/Heme/Allergies: Does not bruise/bleed easily.  Psychiatric/Behavioral: Negative for  depression and suicidal ideas. The patient does not have insomnia.     Past Medical History:  Diagnosis Date  . Anemia    hx of  . Asthma   . Cellulitis of lower extremity    "usually RLL; this time it's got up to my lower abdoment" (02/11/2014)-series of antibiotics completed 02-28-14  . Diverticulosis   . Edema    legs  . Hypertension   . Infection of right knee (HCC)   . OSA (obstructive sleep apnea)    does not use cpap   . PONV (postoperative nausea and vomiting)   . Rheumatoid arthritis (HCC) dx'd ~ 1977  . S/P PICC central line placement 03-16-14   right upper arm remains intact.05-11-14 removed 1 week ago.    Social History   Tobacco Use  . Smoking status: Former Smoker    Packs/day: 1.00    Years: 3.00    Pack years: 3.00    Types: Cigarettes    Quit date: 03/16/1981    Years since quitting: 39.2  . Smokeless tobacco: Current User    Types: Snuff  . Tobacco comment: "quit smoking cigarettes in the 1980's"  Vaping Use  . Vaping Use: Never used  Substance Use Topics  . Alcohol use: No  . Drug use: No    Family History  Problem Relation Age of Onset  . Colon cancer Mother   . Hypertension Mother   . Diabetes Mellitus II Paternal Grandmother    Allergies  Allergen Reactions  . Avelox [Moxifloxacin Hcl In Nacl] Hives, Shortness Of Breath and Itching    Tolerates Cipro  . Doxycycline Itching  . Rocephin [Ceftriaxone Sodium In Dextrose] Hives and Itching  . Sulfa Antibiotics Itching  . Vancomycin Hives and Itching  . Erythrocin Rash  . Penicillins Rash  . Sulfasalazine Rash    OBJECTIVE: Blood pressure (!) 141/80, pulse (!) 57, temperature 97.8 F (36.6 C), temperature source Oral, resp. rate 18, height 5\' 8"  (1.727 m), weight (!) 151.2 kg, SpO2 100 %.  Physical Exam Constitutional:      Appearance: He is well-developed.  HENT:     Head: Normocephalic and atraumatic.  Eyes:     Conjunctiva/sclera: Conjunctivae normal.  Cardiovascular:     Rate and  Rhythm: Normal rate and regular rhythm.  Pulmonary:     Effort: Pulmonary effort is normal. No respiratory distress.     Breath sounds: No wheezing.  Abdominal:     General: There is no distension.     Palpations: Abdomen is soft.  Musculoskeletal:        General: Swelling present. No tenderness. Normal range of motion.     Cervical back: Normal range of motion and neck supple.  Skin:    General: Skin is warm and dry.     Coloration: Skin is not pale.     Findings: No erythema or rash.  Neurological:     General: No focal deficit present.     Mental Status: He is alert and oriented to person, place, and time.  Psychiatric:        Attention and Perception: Attention and perception normal.  Mood and Affect: Mood and affect normal.        Speech: Speech normal.        Behavior: Behavior normal.        Thought Content: Thought content normal.        Cognition and Memory: Cognition and memory normal.        Judgment: Judgment normal.     Lab Results Lab Results  Component Value Date   WBC 6.7 06/15/2020   HGB 11.6 (L) 06/15/2020   HCT 37.7 (L) 06/15/2020   MCV 88.3 06/15/2020   PLT 273 06/15/2020    Lab Results  Component Value Date   CREATININE 0.89 06/15/2020   BUN 21 (H) 06/15/2020   NA 141 06/15/2020   K 4.2 06/15/2020   CL 110 06/15/2020   CO2 25 06/15/2020    Lab Results  Component Value Date   ALT 16 06/15/2020   AST 20 06/15/2020   ALKPHOS 52 06/15/2020   BILITOT 0.4 06/15/2020     Microbiology: Recent Results (from the past 240 hour(s))  Surgical pcr screen     Status: None   Collection Time: 06/15/20  8:43 AM   Specimen: Nasal Mucosa; Nasal Swab  Result Value Ref Range Status   MRSA, PCR NEGATIVE NEGATIVE Final   Staphylococcus aureus NEGATIVE NEGATIVE Final    Comment: (NOTE) The Xpert SA Assay (FDA approved for NASAL specimens in patients 37 years of age and older), is one component of a comprehensive surveillance program. It is not  intended to diagnose infection nor to guide or monitor treatment. Performed at [facility], [address] [place], [place] [address]   SARS CORONAVIRUS 2 (TAT 6-24 HRS) Nasopharyngeal Nasopharyngeal Swab     Status: None   Collection Time: 06/18/20 11:32 AM   Specimen: Nasopharyngeal Swab  Result Value Ref Range Status   SARS Coronavirus 2 NEGATIVE NEGATIVE Final    Comment: (NOTE) SARS-CoV-2 target nucleic acids are NOT DETECTED.  The SARS-CoV-2 RNA is generally detectable in upper and lower respiratory specimens during the acute phase of infection. Negative results do not preclude SARS-CoV-2 infection, do not rule out co-infections with other pathogens, and should not be used as the sole basis for treatment or other patient management decisions. Negative results must be combined with clinical observations, patient history, and epidemiological information. The expected result is Negative.  Fact Sheet for Patients: HairSlick.no  Fact Sheet for Healthcare Providers: quierodirigir.com  This test is not yet approved or cleared by the [place] FDA and  has been authorized for detection and/or diagnosis of SARS-CoV-2 by FDA under an Emergency Use Authorization (EUA). This EUA will remain  in effect (meaning this test can be used) for the duration of the COVID-19 declaration under Se ction 564(b)(1) of the Act, 21 U.S.C. section 360bbb-3(b)(1), unless the authorization is terminated or revoked sooner.  Performed at [facility], [address] [place], [place] [address]   Aerobic/Anaerobic Culture (surgical/deep wound)     Status: None (Preliminary result)   Collection Time: 06/22/20  8:56 AM   Specimen: Joint, Other; Body Fluid  Result Value Ref Range Status   Specimen Description   Final    TISSUE RT KNEE Performed at [facility], [address], [place], [place]  [address]    Special Requests   Final    NONE Performed at [facility], [address], [place], [place] [address]    Gram Stain  Final    NO WBC SEEN NO ORGANISMS SEEN Performed at [facility], [address] [place], [place] [address]    Culture PENDING  Incomplete   Report Status PENDING  Incomplete  Anaerobic culture     Status: None (Preliminary result)   Collection Time: 06/22/20  8:56 AM   Specimen: Synovium  Result Value Ref Range Status   Specimen Description   Final    SYNOVIAL RT KNEE Performed at [facility], [address], [place], [place] [address]    Special Requests   Final    NONE Performed at [facility], [address] [place], [place] [address]    Gram Stain   Final    NO WBC SEEN NO ORGANISMS SEEN Performed at [facility], [address] [place], [place] [address]    Culture PENDING  Incomplete   Report Status PENDING  Incomplete  Body fluid culture     Status: None (Preliminary result)   Collection Time: 06/22/20  8:56 AM   Specimen: Synovium  Result Value Ref Range Status   Specimen Description   Final    SYNOVIAL RT KNEE Performed at [facility], [address] [place], [place] [address]    Special Requests   Final    NONE Performed at [facility], [address], [place], [place] [address]    Gram Stain   Final    NO WBC SEEN NO ORGANISMS SEEN Performed at [facility], [address], [place], [place] [address]    Culture PENDING  Incomplete   Report Status PENDING  Incomplete    Acey Lav, MD [facility] [phone] pager  06/22/2020, 3:11 PM

## 2020-06-22 NOTE — Interval H&P Note (Signed)
History and Physical Interval Note:  06/22/2020 7:54 AM  [name]  has presented today for surgery, with the diagnosis of right total knee arthroplasty infection.  The various methods of treatment have been discussed with the patient and family. After consideration of risks, benefits and other options for treatment, the patient has consented to  Procedure(s) with comments: Right knee resection arthroplasty, antibiotic spacer (Right) - as a surgical intervention.  The patient's history has been reviewed, patient examined, no change in status, stable for surgery.  I have reviewed the patient's chart and labs.  Questions were answered to the patient's satisfaction.     [name] Fellers Richetta Cubillos

## 2020-06-22 NOTE — Anesthesia Procedure Notes (Signed)
Procedure Name: LMA Insertion Date/Time: 06/22/2020 8:28 AM Performed by: Epimenio Sarin, CRNA Pre-anesthesia Checklist: Patient identified, Emergency Drugs available, Suction available, Patient being monitored and Timeout performed Patient Re-evaluated:Patient Re-evaluated prior to induction Oxygen Delivery Method: Circle system utilized Preoxygenation: Pre-oxygenation with 100% oxygen Induction Type: IV induction LMA: LMA with gastric port inserted LMA Size: 4.0 Number of attempts: 1 Dental Injury: Teeth and Oropharynx as per pre-operative assessment

## 2020-06-22 NOTE — Progress Notes (Signed)
PHARMACY CONSULT NOTE FOR:  OUTPATIENT  PARENTERAL ANTIBIOTIC THERAPY (OPAT)  Indication: Prosthetic knee infection s/p hardware removal Regimen: Cefepime 2 gm IV q 8 hours  End date: 08/03/2020  IV antibiotic discharge orders are pended. To discharging provider:  please sign these orders via discharge navigator,  Select New Orders & click on the button choice - Manage This Unsigned Work.     Thank you for allowing pharmacy to be a part of this patient's care.  Sharin Mons, PharmD, BCPS, BCIDP Infectious Diseases Clinical Pharmacist Phone: [phone] 06/22/2020, 3:33 PM

## 2020-06-22 NOTE — Anesthesia Postprocedure Evaluation (Signed)
Anesthesia Post Note  Patient: [name]  Procedure(s) Performed: Right knee resection arthroplasty, antibiotic spacer (Right Knee)     Patient location during evaluation: PACU Anesthesia Type: General Level of consciousness: awake and alert Pain management: pain level controlled Vital Signs Assessment: post-procedure vital signs reviewed and stable Respiratory status: spontaneous breathing, nonlabored ventilation, respiratory function stable and patient connected to nasal cannula oxygen Cardiovascular status: blood pressure returned to baseline and stable Postop Assessment: no apparent nausea or vomiting Anesthetic complications: no   No complications documented.  Last Vitals:  Vitals:   06/22/20 1130 06/22/20 1145  BP: 125/78 126/74  Pulse: (!) 51 (!) 49  Resp: 13 16  Temp: 36.7 C   SpO2: 96% 100%    Last Pain:  Vitals:   06/22/20 1145  TempSrc:   PainSc: 0-No pain                 Kennieth Rad

## 2020-06-22 NOTE — Transfer of Care (Signed)
Immediate Anesthesia Transfer of Care Note  Patient: [name]  Procedure(s) Performed: Right knee resection arthroplasty, antibiotic spacer (Right Knee)  Patient Location: PACU  Anesthesia Type:GA combined with regional for post-op pain  Level of Consciousness: drowsy  Airway & Oxygen Therapy: Patient Spontanous Breathing and Patient connected to face mask oxygen  Post-op Assessment: Report given to RN and Post -op Vital signs reviewed and stable  Post vital signs: Reviewed and stable  Last Vitals:  Vitals Value Taken Time  BP 136/83 06/22/20 1036  Temp    Pulse 50 06/22/20 1038  Resp 9 06/22/20 1038  SpO2 100 % 06/22/20 1038  Vitals shown include unvalidated device data.  Last Pain:  Vitals:   06/22/20 0704  TempSrc: Oral         Complications: No complications documented.

## 2020-06-22 NOTE — Progress Notes (Signed)
Brief Pharmacy note: Methotrexate 20mg  po qMonday resumed with post-op orders.  Currently with PJI, antibiotic spacer placed, would not resume Methotrexate during admission.   Methotrexate (Trexall; Rheumatrex) hold criteria  Hgb < 8  WBC < 3  Pltc < 100K  SCr > 1.5x baseline (or > 2 if baseline unknown)  AST or ALT >3x ULN  Bili > 1.5x ULN  Ascites or pleural effusion  Diarrhea - Grade 2 or higher  Ulcerative stomatitis  Unexplained pneumonitis / hypoxemia  Active infection  05-09-1980 PharmD [facility] [phone] 06/22/2020, 12:02 PM

## 2020-06-22 NOTE — Plan of Care (Signed)
?  Problem: Education: ?Goal: Knowledge of General Education information will improve ?Description: Including pain rating scale, medication(s)/side effects and non-pharmacologic comfort measures ?Outcome: Progressing ?  ?Problem: Activity: ?Goal: Risk for activity intolerance will decrease ?Outcome: Progressing ?  ?Problem: Nutrition: ?Goal: Adequate nutrition will be maintained ?Outcome: Progressing ?  ?Problem: Elimination: ?Goal: Will not experience complications related to bowel motility ?Outcome: Progressing ?  ?Problem: Pain Managment: ?Goal: General experience of comfort will improve ?Outcome: Progressing ?  ?Problem: Education: ?Goal: Knowledge of the prescribed therapeutic regimen will improve ?Outcome: Progressing ?  ?Problem: Activity: ?Goal: Ability to avoid complications of mobility impairment will improve ?Outcome: Progressing ?  ?Problem: Pain Management: ?Goal: Pain level will decrease with appropriate interventions ?Outcome: Progressing ?  ?

## 2020-06-22 NOTE — Progress Notes (Signed)
Peripherally Inserted Central Catheter Placement  The IV Nurse has discussed with the patient and/or persons authorized to consent for the patient, the purpose of this procedure and the potential benefits and risks involved with this procedure.  The benefits include less needle sticks, lab draws from the catheter, and the patient may be discharged home with the catheter. Risks include, but not limited to, infection, bleeding, blood clot (thrombus formation), and puncture of an artery; nerve damage and irregular heartbeat and possibility to perform a PICC exchange if needed/ordered by physician.  Alternatives to this procedure were also discussed.  Bard Power PICC patient education guide, fact sheet on infection prevention and patient information card has been provided to patient /or left at bedside.    PICC Placement Documentation  PICC Single Lumen 06/22/20 PICC Right Basilic 42 cm 0 cm (Active)  Indication for Insertion or Continuance of Line Home intravenous therapies (PICC only) 06/22/20 1816  Exposed Catheter (cm) 0 cm 06/22/20 1816  Site Assessment Clean;Dry;Intact 06/22/20 1816  Line Status Flushed;Blood return noted 06/22/20 1816  Dressing Type Transparent 06/22/20 1816  Dressing Status Clean;Dry;Intact;Antimicrobial disc in place;Other (Comment) 06/22/20 1816  Dressing Intervention New dressing 06/22/20 1816  Dressing Change Due 06/29/20 06/22/20 1816       Reginia Forts [place] 06/22/2020, 6:18 PM

## 2020-06-22 NOTE — TOC Initial Note (Addendum)
Transition of Care [facility]) - Initial/Assessment Note    Patient Details  Name: [name] MRN: [id number] Date of Birth: 10-23-1960  [facility]) CM/SW Contact:    Clearance Coots, LCSW Phone Number: 06/22/2020, 2:59 PM  Clinical Narrative:                 CSW met with the patient at bedside to discuss home health agency choice for IV therapy. Patient and spouse contacted [facility] prior to the patient admitting. Patient reports he has used the agency twice in the past because no other agency will services where they live. Spouse provided [facility] fax#  [phone]. CSW called [phone] to confirm the fax number. Agency request home health orders and prescription, PICC line note, Op note an ID note be faxed Patient reports he has DME (RW, RW w/seat, Handicap shower and commode). Patient has requested new crutches. Nurse notified. TOC staff will fax requested documents as information becomes available by the medical team.   Expected Discharge Plan: Home w Home Health Services Barriers to Discharge: Continued Medical Work up   Patient Goals and CMS Choice Patient states their goals for this hospitalization and ongoing recovery are:: return home with IV antibiotic therapy CMS Medicare.gov Compare Post Acute Care list provided to:: Patient Choice offered to / list presented to : Patient  Expected Discharge Plan and Services Expected Discharge Plan: Home w Home Health Services     Post Acute Care Choice: Home Health Living arrangements for the past 2 months: Single Family Home                                      Prior Living Arrangements/Services Living arrangements for the past 2 months: Single Family Home Lives with:: Spouse Patient language and need for interpreter reviewed:: No Do you feel safe going back to the place where you live?: Yes      Need for Family Participation in Patient Care: Yes (Comment) Care giver support system in place?: Yes  (comment) Current home services: DME Criminal Activity/Legal Involvement Pertinent to Current Situation/Hospitalization: No - Comment as needed  Activities of Daily Living Home Assistive Devices/Equipment: Cane (specify quad or straight), Wheelchair, Environmental consultant (specify type) ADL Screening (condition at time of admission) Patient's cognitive ability adequate to safely complete daily activities?: Yes Is the patient deaf or have difficulty hearing?: No Does the patient have difficulty seeing, even when wearing glasses/contacts?: No Does the patient have difficulty concentrating, remembering, or making decisions?: No Patient able to express need for assistance with ADLs?: Yes Does the patient have difficulty dressing or bathing?: No Independently performs ADLs?: Yes (appropriate for developmental age) Does the patient have difficulty walking or climbing stairs?: Yes Weakness of Legs: None Weakness of Arms/Hands: None  Permission Sought/Granted Permission sought to share information with : Case Manager, Family Supports, Magazine features editor Permission granted to share information with : Yes, Verbal Permission Granted  Share Information with NAME: Illya Gienger  Permission granted to share info w AGENCY: [facility] - [employer] [phone]) (Fax [phone])  Permission granted to share info w Relationship: Spouse  Permission granted to share info w Contact Information: [phone]  [phone]  Emotional Assessment Appearance:: Appears stated age Attitude/Demeanor/Rapport: Engaged Affect (typically observed): Accepting Orientation: : Oriented to Self, Oriented to Place, Oriented to  Time, Oriented to Situation Alcohol / Substance Use: Not Applicable Psych Involvement: No (comment)  Admission  diagnosis:  Septic arthritis of knee, right [facility]) [M00.9] Patient Active Problem List   Diagnosis Date Noted  . Septic arthritis of knee ([place]) 08/26/2017  . Medication monitoring  encounter 11/27/2016  . Allergy history, drug 10/24/2016  . Septic arthritis of knee, right ([place]) 04/25/2016  . 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 Prepatellar bursitis of right knee 09/28/2014  . Infected prepatellar bursa 09/28/2014  . OA (osteoarthritis) of knee 05/21/2014  . Infection of prosthetic right knee joint ([place]) 03/18/2014  . Cellulitis and abscess of trunk 02/15/2014    Class: Acute  . Anemia 02/15/2014    Class: Chronic  . Cellulitis of leg, right 01/13/2013    Class: Acute  . Rheumatoid arthritis ([place]) 01/13/2013    Class: Chronic  . Obstructive sleep apnea 01/13/2013    Class: Chronic  . Chronic venous insufficiency 01/13/2013    Class: Chronic   PCP:  Leanna Battles, MD Pharmacy:   [id number] [address], [place] [address] Phone: [phone] Fax: [phone]     Social Determinants of Health (SDOH) Interventions    Readmission Risk Interventions No flowsheet data found.

## 2020-06-22 NOTE — Op Note (Signed)
[name], [name] MEDICAL RECORD [id number] ACCOUNT [id number] DATE OF BIRTH:May 20, 1961 FACILITY: [facility] LOCATION: [id number] PHYSICIAN:Marlin Jarrard Dulcy Fanny, MD  OPERATIVE REPORT  DATE OF PROCEDURE:  06/22/2020  PREOPERATIVE DIAGNOSIS:  Infected right total knee arthroplasty.  POSTOPERATIVE DIAGNOSIS:  Infected right total knee arthroplasty.  PROCEDURE:  Right knee resection arthroplasty and antibiotic spacer.  SURGEON:  Ollen Gross, MD  ASSISTANT:  Leilani Able, PA-C  ANESTHESIA:  General  and an adductor canal block.  ESTIMATED BLOOD LOSS:  50 mL.  DRAIN:  Hemovac x1.  TOURNIQUET TIME:  67 minutes at 300 mmHg.  COMPLICATIONS:  None.  CONDITION:  Stable to recovery.  BRIEF CLINICAL NOTE:  The patient is a 59 year old male with a long complex history in regard to his right knee.  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  He presents now  for resection arthroplasty and antibiotic spacer.  PROCEDURE IN DETAIL After successful administration of an adductor canal block and then a general anesthetic, a tourniquet was placed high on his right thigh and his right lower extremity prepped and draped in the usual sterile fashion.  Extremity was  wrapped in Esmarch and tourniquet inflated to 300 mmHg.  Midline incision was made with a 10 blade through subcutaneous tissue to the level of the extensor mechanism.  He did have a large subcutaneous effusion.  A fresh blade was used to make a medial  parapatellar arthrotomy.  Fluid in the joint was then sent for stat Gram stain, culture and sensitivity.  We also took some inflamed appearing synovial tissue and sent that for culture.  The soft tissue over the proximal medial tibia was then  subperiosteally elevated to the joint line with a knife and into the semimembranosus bursa with a Cobb elevator.  Soft tissue laterally was elevated with attention being paid to  avoid the patellar tendon on tibial tubercle.  I subluxed the patella  laterally and was able to translate the tibia forward to dislocate the tibia from the femur.  I was able to remove the tibial polyethylene.  There appeared to be some rotational instability in the femoral component.  An osteotome was used to disrupt any  remaining solid interface between the component and bone.  We then were able to remove the femoral component with minimal to no bone loss.  Soft tissue that was present overlying the remaining bone was removed with a rongeur.  Curettes were used to  thoroughly debride the medullary canal of the femur to remove any abnormal soft tissue.  I inspected the canal and all soft tissue was removed.  We then addressed the tibia.  Tibia retractors were placed to sublux the tibia forward.  I used osteotomes and saw to interrupt the interface between the tibial component and bone.  After doing this, I was able to use a bone tamp and extract the tibial component, which was removed in  total, with essentially no bone loss.  There was cement still in the canal distally and that was removed with osteotomes.  The cement restrictor was then identified and that was also removed.  Tibial canal was then also debrided with the rongeur and the  curette.  The patellar component was left intact as the remaining patella was thin and if I removed this, then there would essentially be no patella left to reimplant later.  It was felt that it was safe to leave this intact.  The  wound was then  thoroughly irrigated with 4 L of saline using pulsatile lavage.  The remaining tissue all had a healthy appearance.  We then mixed 5 batches of gentamicin impregnated cement to configure the spacer.  Given the large gap, I also placed 3 Steinmann pins  centrally through the spacer and then created the equivalent of a cemented stem in the femur and tibia for more stability.  I used a Rebar to prevent the cement spacer from  breaking.  This effectively filled the extension space, allowing excellent  stability.  When the cement was fully hardened, then we further irrigated.  We closed the arthrotomy with a running 0 Stratafix suture.  Given that he had a subcutaneous fluid collection preoperatively, I placed a Hemovac drain subcutaneously.   Subcutaneous was then closed with interrupted 2-0 Vicryl and skin with staples.  Incisions cleaned and dried and a bulky sterile dressing applied.  The drain was hooked to suction.  He was placed into a knee immobilizer, awakened and transported to  recovery in stable condition.  Note that the surgical assistant was a medical necessity for this procedure to do it in a safe and expeditious manner.  The assistant was necessary for retraction of vital ligaments and neurovascular structures and for proper positioning of the limb for  safe removal of the old implant and safe and accurate placement of the spacer.  VN/NUANCE  D:06/22/2020 T:06/22/2020 JOB:[id number]

## 2020-06-22 NOTE — Brief Op Note (Signed)
06/22/2020  10:01 AM  PATIENT:  [name]  59 y.o. male  PRE-OPERATIVE DIAGNOSIS:  right total knee arthroplasty infection  POST-OPERATIVE DIAGNOSIS:  right total knee arthroplasty infection  PROCEDURE:  Procedure(s) with comments: Right knee resection arthroplasty, antibiotic spacer (Right) -  SURGEON:  Surgeon(s) and Role:    Ollen Gross, MD - Primary  PHYSICIAN ASSISTANT:   ASSISTANTS: Leilani Able, PA-C   ANESTHESIA:   general and adductor canal block  EBL:  50 ml  BLOOD ADMINISTERED:none  DRAINS: (Medium) Hemovact drain(s) in the right knee with  Suction Open   LOCAL MEDICATIONS USED:  NONE  COUNTS:  YES  TOURNIQUET:   Total Tourniquet Time Documented: Thigh (Right) - 67 minutes Total: Thigh (Right) - 67 minutes   DICTATION: .Other Dictation: Dictation Number [id number]  PLAN OF CARE: Admit to inpatient   PATIENT DISPOSITION:  PACU - hemodynamically stable.

## 2020-06-22 NOTE — Anesthesia Procedure Notes (Signed)
Anesthesia Regional Block: Adductor canal block   Pre-Anesthetic Checklist: ,, timeout performed, Correct Patient, Correct Site, Correct Laterality, Correct Procedure, Correct Position, site marked, Risks and benefits discussed,  Surgical consent,  Pre-op evaluation,  At surgeon's request and post-op pain management  Laterality: Right  Prep: chloraprep       Needles:  Injection technique: Single-shot  Needle Type: Echogenic Needle     Needle Length: 9cm  Needle Gauge: 21     Additional Needles:   Procedures:,,,, ultrasound used (permanent image in chart),,,,  Narrative:  Start time: 06/22/2020 8:04 AM End time: 06/22/2020 8:09 AM Injection made incrementally with aspirations every 5 mL.  Performed by: Personally  Anesthesiologist: Marcene Duos, MD

## 2020-06-23 ENCOUNTER — Encounter (HOSPITAL_COMMUNITY): Payer: Self-pay | Admitting: Orthopedic Surgery

## 2020-06-23 DIAGNOSIS — M05712 Rheumatoid arthritis with rheumatoid factor of left shoulder without organ or systems involvement: Secondary | ICD-10-CM

## 2020-06-23 DIAGNOSIS — M05711 Rheumatoid arthritis with rheumatoid factor of right shoulder without organ or systems involvement: Secondary | ICD-10-CM

## 2020-06-23 LAB — BASIC METABOLIC PANEL
Anion gap: 6 (ref 5–15)
BUN: 18 mg/dL (ref 6–20)
CO2: 25 mmol/L (ref 22–32)
Calcium: 8.1 mg/dL — ABNORMAL LOW (ref 8.9–10.3)
Chloride: 105 mmol/L (ref 98–111)
Creatinine, Ser: 1.03 mg/dL (ref 0.61–1.24)
GFR calc Af Amer: >60 mL/min (ref >60)
GFR calc non Af Amer: >60 mL/min (ref >60)
Glucose, Bld: 152 mg/dL — ABNORMAL HIGH (ref 70–99)
Potassium: 4.3 mmol/L (ref 3.5–5.1)
Sodium: 136 mmol/L (ref 135–145)

## 2020-06-23 LAB — CBC
HCT: 36.3 % — ABNORMAL LOW (ref 39.0–52.0)
Hemoglobin: 11.1 g/dL — ABNORMAL LOW (ref 13.0–17.0)
MCH: 27.2 pg (ref 26.0–34.0)
MCHC: 30.6 g/dL (ref 30.0–36.0)
MCV: 89 fL (ref 80.0–100.0)
Platelets: 301 10*3/uL (ref 150–400)
RBC: 4.08 MIL/uL — ABNORMAL LOW (ref 4.22–5.81)
RDW: 16.6 % — ABNORMAL HIGH (ref 11.5–15.5)
WBC: 12.3 10*3/uL — ABNORMAL HIGH (ref 4.0–10.5)
nRBC: 0 % (ref 0.0–0.2)

## 2020-06-23 MED ORDER — TRAMADOL HCL 50 MG PO TABS: 50.0000 mg | ORAL_TABLET | Freq: Four times a day (QID) | ORAL | 0 refills | Status: DC | PRN

## 2020-06-23 MED ORDER — ASPIRIN 325 MG PO TBEC: 325.0000 mg | DELAYED_RELEASE_TABLET | Freq: Every day | ORAL | 0 refills | Status: AC

## 2020-06-23 MED ORDER — METHOCARBAMOL 500 MG PO TABS: 500.0000 mg | ORAL_TABLET | Freq: Four times a day (QID) | ORAL | 0 refills | Status: DC | PRN

## 2020-06-23 MED ORDER — OXYCODONE HCL 5 MG PO TABS: 5.0000 mg | ORAL_TABLET | Freq: Four times a day (QID) | ORAL | 0 refills | Status: DC | PRN

## 2020-06-23 MED ORDER — CEFEPIME IV (FOR PTA / DISCHARGE USE ONLY): 2.0000 g | Freq: Three times a day (TID) | INTRAVENOUS | 0 refills | Status: DC

## 2020-06-23 NOTE — Discharge Instructions (Addendum)
Ollen Gross, MD [facility] [place] [address] [place], [place] [address] [phone]  POSTOPERATIVE DIRECTIONS  HOME CARE INSTRUCTIONS  . Remove items at home which could result in a fall. This includes throw rugs or furniture in walking pathways.  . ICE to the affected knee as much as tolerated. Icing helps control swelling. If the swelling is well controlled you will be more comfortable and rehab easier. Continue to use ice on the knee for pain and swelling from surgery. You may notice swelling that will progress down to the foot and ankle. This is normal after surgery. Elevate the leg when you are not up walking on it.    . Continue to use the breathing machine which will help keep your temperature down. It is common for your temperature to cycle up and down following surgery, especially at night when you are not up moving around and exerting yourself. The breathing machine keeps your lungs expanded and your temperature down. . Do not place pillow under the operative knee, focus on keeping the knee straight while resting  DIET You may resume your previous home diet once you are discharged from the hospital.  DRESSING / WOUND CARE / SHOWERING . Keep your bulky bandage on for 2 days. On the third post-operative day you may remove the Ace bandage and gauze. There is a waterproof adhesive bandage on your skin which will stay in place until your first follow-up appointment. Once you remove this you will not need to place another bandage . You may begin showering 3 days following surgery, but do not submerge the incision under water.  ACTIVITY For the first 5 days, the key is rest and control of pain and swelling . You should rest, ice and elevate the leg for 50 minutes out of every hour. Get up and walk/stretch for 10 minutes per hour. After 5 days you can increase your activity slowly as tolerated. . Walk with your walker as instructed.  Use the walker until you are comfortable transitioning to a cane. Walk with the cane in the opposite hand of the operative leg. You may discontinue the cane once you are comfortable and walking steadily. . Avoid periods of inactivity such as sitting longer than an hour when not asleep. This helps prevent blood clots.  . You may discontinue the knee immobilizer once you are able to perform a straight leg raise while lying down. . You may resume a sexual relationship in one month or when given the OK by your doctor.  . You may return to work once you are cleared by your doctor.  . Do not drive a car for 6 weeks or until released by your surgeon.  . Do not drive while taking narcotics.  TED HOSE STOCKINGS Wear the elastic stockings on both legs for three weeks following surgery during the day. You may remove them at night for sleeping.  WEIGHT BEARING Weight bearing as tolerated with assist device (walker, cane, etc) as directed, use it as long as suggested by your surgeon or therapist, typically at least 4-6 weeks.  POSTOPERATIVE CONSTIPATION PROTOCOL Constipation - defined medically as fewer than three stools per week and severe constipation as less than one stool per week.  One of the most common issues patients have following surgery is constipation.  Even if you have a regular bowel pattern at home, your normal regimen is likely to be disrupted due to multiple reasons following surgery.  Combination of  anesthesia, postoperative narcotics, change in appetite and fluid intake all can affect your bowels.  In order to avoid complications following surgery, here are some recommendations in order to help you during your recovery period.  . Colace (docusate) - Pick up an over-the-counter form of Colace or another stool softener and take twice a day as long as you are requiring postoperative pain medications.  Take with a full glass of water daily.  If you experience loose stools or diarrhea, hold the  colace until you stool forms back up. If your symptoms do not get better within 1 week or if they get worse, check with your doctor. . Dulcolax (bisacodyl) - Pick up over-the-counter and take as directed by the product packaging as needed to assist with the movement of your bowels.  Take with a full glass of water.  Use this product as needed if not relieved by Colace only.  . MiraLax (polyethylene glycol) - Pick up over-the-counter to have on hand. MiraLax is a solution that will increase the amount of water in your bowels to assist with bowel movements.  Take as directed and can mix with a glass of water, juice, soda, coffee, or tea. Take if you go more than two days without a movement. Do not use MiraLax more than once per day. Call your doctor if you are still constipated or irregular after using this medication for 7 days in a row.  If you continue to have problems with postoperative constipation, please contact the office for further assistance and recommendations.  If you experience "the worst abdominal pain ever" or develop nausea or vomiting, please contact the office immediatly for further recommendations for treatment.  ITCHING If you experience itching with your medications, try taking only a single pain pill, or even half a pain pill at a time.  You can also use Benadryl over the counter for itching or also to help with sleep.   MEDICATIONS See your medication summary on the "After Visit Summary" that the nursing staff will review with you prior to discharge.  You may have some home medications which will be placed on hold until you complete the course of blood thinner medication.  It is important for you to complete the blood thinner medication as prescribed by your surgeon.  Continue your approved medications as instructed at time of discharge.  PRECAUTIONS . If you experience chest pain or shortness of breath - call 911 immediately for transfer to the hospital emergency department.   . If you develop a fever greater that 101 F, purulent drainage from wound, increased redness or drainage from wound, foul odor from the wound/dressing, or calf pain - CONTACT YOUR SURGEON.                                                   FOLLOW-UP APPOINTMENTS Make sure you keep all of your appointments after your operation with your surgeon and caregivers. You should call the office at the above phone number and make an appointment for approximately two weeks after the date of your surgery or on the date instructed by your surgeon outlined in the "After Visit Summary".  RANGE OF MOTION AND STRENGTHENING EXERCISES  Rehabilitation of the knee is important following a knee injury or an operation. After just a few days of immobilization, the muscles of the thigh which  control the knee become weakened and shrink (atrophy). Knee exercises are designed to build up the tone and strength of the thigh muscles and to improve knee motion. Often times heat used for twenty to thirty minutes before working out will loosen up your tissues and help with improving the range of motion but do not use heat for the first two weeks following surgery. These exercises can be done on a training (exercise) mat, on the floor, on a table or on a bed. Use what ever works the best and is most comfortable for you Knee exercises include:  . Leg Lifts - While your knee is still immobilized in a splint or cast, you can do straight leg raises. Lift the leg to 60 degrees, hold for 3 sec, and slowly lower the leg. Repeat 10-20 times 2-3 times daily. Perform this exercise against resistance later as your knee gets better.  Isla Pence and Hamstring Sets - Tighten up the muscle on the front of the thigh (Quad) and hold for 5-10 sec. Repeat this 10-20 times hourly. Hamstring sets are done by pushing the foot backward against an object and holding for 5-10 sec. Repeat as with quad sets.   Leg Slides: Lying on your back, slowly slide your foot  toward your buttocks, bending your knee up off the floor (only go as far as is comfortable). Then slowly slide your foot back down until your leg is flat on the floor again.  Angel Wings: Lying on your back spread your legs to the side as far apart as you can without causing discomfort.  A rehabilitation program following serious knee injuries can speed recovery and prevent re-injury in the future due to weakened muscles. Contact your doctor or a physical therapist for more information on knee rehabilitation.   IF YOU ARE TRANSFERRED TO A SKILLED REHAB FACILITY If the patient is transferred to a skilled rehab facility following release from the hospital, a list of the current medications will be sent to the facility for the patient to continue.  When discharged from the skilled rehab facility, please have the facility set up the patient's Home Health Physical Therapy prior to being released. Also, the skilled facility will be responsible for providing the patient with their medications at time of release from the facility to include their pain medication, the muscle relaxants, and their blood thinner medication. If the patient is still at the rehab facility at time of the two week follow up appointment, the skilled rehab facility will also need to assist the patient in arranging follow up appointment in our office and any transportation needs.  MAKE SURE YOU:  . Understand these instructions.  . Get help right away if you are not doing well or get worse.   Pick up stool softner and laxative for home use following surgery while on pain medications. Do not submerge incision under water. Please use good hand washing techniques while changing dressing each day. May shower starting three days after surgery. Please use a clean towel to pat the incision dry following showers. Continue to use ice for pain and swelling after surgery. Do not use any lotions or creams on the incision until instructed by your  surgeon.

## 2020-06-23 NOTE — TOC Progression Note (Addendum)
Transition of Care [facility]) - Progression Note    Patient Details  Name: [name] MRN: [id number] Date of Birth: [birth year]  [facility] CM/SW Contact  Clearance Coots, LCSW Phone Number: 06/23/2020, 1:48 PM  Clinical Narrative:    CSW spoke with staff member Rosey Bath. CSW faxed prescription with scheduled lab orders, orders for IV flushes, and all orders for IV infusion protocols/care. The agency will use [facility] RN services.  Spouse Lupita Leash at bedside and aware of d/c plan of care.   9/9 I&D progress note faxed with PICC Care Protocol information.    TOC staff will continue to follow this patient.    Expected Discharge Plan: Home w Home Health Services Barriers to Discharge: Continued Medical Work up  Expected Discharge Plan and Services Expected Discharge Plan: Home w Home Health Services     Post Acute Care Choice: Home Health Living arrangements for the past 2 months: Single Family Home                                       Social Determinants of Health (SDOH) Interventions    Readmission Risk Interventions No flowsheet data found.

## 2020-06-23 NOTE — Progress Notes (Signed)
Physical Therapy Treatment Patient Details Name: [name] MRN: [id number] DOB: 1960/11/30 Today's Date: 06/23/2020    History of Present Illness Patient is 59 y.o. male s/p Rt TKR (resection and antibiotic spacer) on 06/22/20 with PMH significant for HTN, asthma, anemia, RA, Bil TKA with Rt in 93 and revision in 2015, Lt in 2008, and Bil THA.    PT Comments    Pt very cooperative and ambulated increased distance into hallway but pain limited.  Pt states hopeful for dc home this date.  Will follow up with stair training this date in anticipation of possible dc.   Follow Up Recommendations  Follow surgeon's recommendation for DC plan and follow-up therapies     Equipment Recommendations  Crutches    Recommendations for Other Services       Precautions / Restrictions Precautions Precautions: Fall Required Braces or Orthoses: Knee Immobilizer - Right Restrictions Weight Bearing Restrictions: No Other Position/Activity Restrictions: WBAT    Mobility  Bed Mobility Overal bed mobility: Needs Assistance Bed Mobility: Supine to Sit;Sit to Supine     Supine to sit: Min guard;HOB elevated Sit to supine: Min assist   General bed mobility comments: pt utilizing rails to assist with supine to sit;  Assist required for L LE back into bed  Transfers Overall transfer level: Needs assistance Equipment used: Rolling walker (2 wheeled) Transfers: Sit to/from Stand Sit to Stand: Min guard;From elevated surface         General transfer comment: cues for LE management and use of UEs to self assist.    Ambulation/Gait Ambulation/Gait assistance: Min assist Gait Distance (Feet): 37 Feet Assistive device: Rolling walker (2 wheeled) Gait Pattern/deviations: Step-to pattern;Decreased stance time - right;Decreased weight shift to right;Antalgic Gait velocity: decr   General Gait Details: min cues for sequence, posture and position from [employer]             Wheelchair  Mobility    Modified Rankin (Stroke Patients Only)       Balance Overall balance assessment: Needs assistance Sitting-balance support: Feet supported Sitting balance-Leahy Scale: Good     Standing balance support: During functional activity;Bilateral upper extremity supported Standing balance-Leahy Scale: Fair Standing balance comment: reliant on external support for gait                            Cognition Arousal/Alertness: Awake/alert Behavior During Therapy: WFL for tasks assessed/performed Overall Cognitive Status: Within Functional Limits for tasks assessed                                        Exercises Total Joint Exercises Ankle Circles/Pumps: AROM;Both;20 reps;Seated    General Comments        Pertinent Vitals/Pain Pain Assessment: 0-10 Pain Score: 7  Pain Location: Rt knee with attempts to WB Pain Descriptors / Indicators: Discomfort;Aching;Sore;Grimacing Pain Intervention(s): Limited activity within patient's tolerance;Monitored during session;Premedicated before session;Ice applied    Home Living                      Prior Function            PT Goals (current goals can now be found in the care plan section) Acute Rehab PT Goals Patient Stated Goal: get home for the weekend PT Goal Formulation: With patient Time For Goal Achievement: 06/29/20  Potential to Achieve Goals: Good Progress towards PT goals: Progressing toward goals    Frequency    7X/week      PT Plan Current plan remains appropriate    Co-evaluation              AM-PAC PT "6 Clicks" Mobility   Outcome Measure  Help needed turning from your back to your side while in a flat bed without using bedrails?: A Little Help needed moving from lying on your back to sitting on the side of a flat bed without using bedrails?: A Little Help needed moving to and from a bed to a chair (including a wheelchair)?: A Little Help needed standing up  from a chair using your arms (e.g., wheelchair or bedside chair)?: A Little Help needed to walk in hospital room?: A Little Help needed climbing 3-5 steps with a railing? : A Little 6 Click Score: 18    End of Session Equipment Utilized During Treatment: Gait belt;Right knee immobilizer Activity Tolerance: Patient tolerated treatment well;Patient limited by pain Patient left: in bed;with call bell/phone within reach;with bed alarm set Nurse Communication: Mobility status PT Visit Diagnosis: Muscle weakness (generalized) (M62.81);Difficulty in walking, not elsewhere classified (R26.2);Pain Pain - Right/Left: Right Pain - part of body: Knee     Time: 1505-6979 PT Time Calculation (min) (ACUTE ONLY): 22 min  Charges:  $Gait Training: 8-22 mins                     Mauro Kaufmann PT Acute Rehabilitation Services Pager [phone] Office [phone]    Shivani Barrantes 06/23/2020, 10:33 AM

## 2020-06-23 NOTE — Progress Notes (Addendum)
Subjective: No new complaints   Antibiotics:  Anti-infectives (From admission, onward)   Start     Dose/Rate Route Frequency Ordered Stop   06/23/20 0000  ceFEPime (MAXIPIME) IVPB        2 g Intravenous Every 8 hours 06/23/20 0755 08/04/20 2359   06/22/20 2000  ciprofloxacin (CIPRO) tablet 750 mg  Status:  Discontinued        750 mg Oral 2 times daily 06/22/20 1149 06/22/20 1512   06/22/20 2000  ceFEPIme (MAXIPIME) 2 g in sodium chloride 0.9 % 100 mL IVPB        2 g 200 mL/hr over 30 Minutes Intravenous Every 8 hours 06/22/20 1512     06/22/20 0631  clindamycin (CLEOCIN) 900 MG/50ML IVPB       Note to Pharmacy: Charmayne Sheer   : cabinet override      06/22/20 0631 06/22/20 0900   06/22/20 0630  clindamycin (CLEOCIN) IVPB 900 mg        900 mg 100 mL/hr over 30 Minutes Intravenous On call to O.R. 06/22/20 3734 06/22/20 2876      Medications: Scheduled Meds: . aspirin EC  325 mg Oral Q breakfast  . Chlorhexidine Gluconate Cloth  6 each Topical Daily  . diltiazem  240 mg Oral Daily  . docusate sodium  100 mg Oral BID  . losartan  100 mg Oral Daily  . metoprolol succinate  100 mg Oral Daily  . pantoprazole  40 mg Oral Daily  . triamterene-hydrochlorothiazide  1 tablet Oral Daily   Continuous Infusions: . sodium chloride 100 mL/hr at 06/23/20 0825  . ceFEPime (MAXIPIME) IV 2 g (06/23/20 1317)  . methocarbamol (ROBAXIN) IV 500 mg (06/22/20 1100)   PRN Meds:.bisacodyl, diphenhydrAMINE, ipratropium-albuterol, menthol-cetylpyridinium **OR** phenol, methocarbamol **OR** methocarbamol (ROBAXIN) IV, metoCLOPramide **OR** metoCLOPramide (REGLAN) injection, mometasone-formoterol, morphine injection, ondansetron **OR** ondansetron (ZOFRAN) IV, oxyCODONE, polyethylene glycol, sodium chloride flush, sodium phosphate, traMADol    Objective: Weight change:   Intake/Output Summary (Last 24 hours) at 06/23/2020 1417 Last data filed at 06/23/2020 1402 Gross per 24 hour  Intake  2685.35 ml  Output 930 ml  Net 1755.35 ml   Blood pressure (!) 160/77, pulse 63, temperature 97.7 F (36.5 C), temperature source Oral, resp. rate 18, height $RemoveBe'5\' 8"'dHPSKClQY$  (1.727 m), weight (!) 151.2 kg, SpO2 98 %. Temp:  [97.5 F (36.4 C)-98.7 F (37.1 C)] 97.7 F (36.5 C) (09/09 1307) Pulse Rate:  [57-67] 63 (09/09 1307) Resp:  [16-18] 18 (09/09 0611) BP: (135-160)/(66-84) 160/77 (09/09 1307) SpO2:  [95 %-100 %] 98 % (09/09 1307)  Physical Exam: General: Alert and awake, oriented x3, not in any acute distress. HEENT: anicteric sclera, EOMI CVS regular rate, normal  Chest: , no wheezing, no respiratory distress Abdomen: soft non-distended,  Extremities knee bandaged PICC in place Neuro: nonfocal  CBC:    BMET Recent Labs    06/23/20 0332  NA 136  K 4.3  CL 105  CO2 25  GLUCOSE 152*  BUN 18  CREATININE 1.03  CALCIUM 8.1*     Liver Panel  No results for input(s): PROT, ALBUMIN, AST, ALT, ALKPHOS, BILITOT, BILIDIR, IBILI in the last 72 hours.     Sedimentation Rate No results for input(s): ESRSEDRATE in the last 72 hours. C-Reactive Protein No results for input(s): CRP in the last 72 hours.  Micro Results: Recent Results (from the past 720 hour(s))  Surgical pcr screen     Status: None  Collection Time: 06/15/20  8:43 AM   Specimen: Nasal Mucosa; Nasal Swab  Result Value Ref Range Status   MRSA, PCR NEGATIVE NEGATIVE Final   Staphylococcus aureus NEGATIVE NEGATIVE Final    Comment: (NOTE) The Xpert SA Assay (FDA approved for NASAL specimens in patients 59 years of age and older), is one component of a comprehensive surveillance program. It is not intended to diagnose infection nor to guide or monitor treatment. Performed at [facility], [place] [address], [place], [place] [address]   SARS CORONAVIRUS 2 (TAT 6-24 HRS) Nasopharyngeal Nasopharyngeal Swab     Status: None   Collection Time: 06/18/20 11:32 AM   Specimen: Nasopharyngeal Swab   Result Value Ref Range Status   SARS Coronavirus 2 NEGATIVE NEGATIVE Final    Comment: (NOTE) SARS-CoV-2 target nucleic acids are NOT DETECTED.  The SARS-CoV-2 RNA is generally detectable in upper and lower respiratory specimens during the acute phase of infection. Negative results do not preclude SARS-CoV-2 infection, do not rule out co-infections with other pathogens, and should not be used as the sole basis for treatment or other patient management decisions. Negative results must be combined with clinical observations, patient history, and epidemiological information. The expected result is Negative.  Fact Sheet for Patients: SugarRoll.be  Fact Sheet for Healthcare Providers: https://www.woods-mathews.com/  This test is not yet approved or cleared by the [place] FDA and  has been authorized for detection and/or diagnosis of SARS-CoV-2 by FDA under an Emergency Use Authorization (EUA). This EUA will remain  in effect (meaning this test can be used) for the duration of the COVID-19 declaration under Se ction 564(b)(1) of the Act, 21 U.S.C. section 360bbb-3(b)(1), unless the authorization is terminated or revoked sooner.  Performed at [facility], [place] [address], [place], [place] [address]   Aerobic/Anaerobic Culture (surgical/deep wound)     Status: None (Preliminary result)   Collection Time: 06/22/20  8:56 AM   Specimen: Joint, Other; Body Fluid  Result Value Ref Range Status   Specimen Description   Final    TISSUE RT KNEE Performed at [place] [address], [place], [place] [address]    Special Requests   Final    NONE Performed at [facility], [place] [address] [place], [place] [address]    Gram Stain NO WBC SEEN NO ORGANISMS SEEN   Final   Culture   Final    NO GROWTH 1 DAY Performed at [facility], [place] [address] [place], [place] [address]    Report  Status PENDING  Incomplete  Anaerobic culture     Status: None (Preliminary result)   Collection Time: 06/22/20  8:56 AM   Specimen: Synovium  Result Value Ref Range Status   Specimen Description   Final    SYNOVIAL RT KNEE Performed at [place] [address], [place], [place] [address]    Special Requests   Final    NONE Performed at [facility], [place] [address] [place], [place] [address]    Gram Stain   Final    NO WBC SEEN NO ORGANISMS SEEN Performed at [facility], [place] [address], [place], [place] [address]    Culture PENDING  Incomplete   Report Status PENDING  Incomplete  Body fluid culture     Status: None (Preliminary result)   Collection Time: 06/22/20  8:56 AM   Specimen: Synovium  Result Value Ref Range  Status   Specimen Description   Final    SYNOVIAL RT KNEE Performed at [place] [address], [place], [place] [address]    Special Requests   Final    NONE Performed at [facility], [place] [address] [place], [place] [address]    Gram Stain NO WBC SEEN NO ORGANISMS SEEN   Final   Culture   Final    RARE GRAM POSITIVE COCCI CRITICAL RESULT CALLED TO, READ BACK BY AND VERIFIED WITH: RN F.KUSITZ AT 7619 ON 06/23/2020 BY T.SAAD Performed at [facility], [place] [address] [place], [place] [address]    Report Status PENDING  Incomplete    Studies/Results: [place] EKG SITE RITE  Result Date: 06/22/2020 If Site Rite image not attached, placement could not be confirmed due to current cardiac rhythm.  [place] EKG SITE RITE  Result Date: 06/22/2020 If Site Rite image not attached, placement could not be confirmed due to current cardiac rhythm.     Assessment/Plan:  INTERVAL HISTORY: [name] obtained 2015 sensis on Serratia species   Active Problems:   Septic arthritis of knee, right (HCC)    [name] is a 59 y.o. male with RA   Recurrent PJI multiple  surgeries, prior Serratia, MS Staph lugdunensis and recently Citrobacter from cultures sp I and D and explantation of prosthesis with abx spacer  #1 PJI  Sensis of prior Serratia   --plan 6 weeks of cefepime  Diagnosis: PJI  Culture Result: culture incubating  Allergies  Allergen Reactions  . Avelox [Moxifloxacin Hcl In Nacl] Hives, Shortness Of Breath and Itching    Tolerates Cipro  . Doxycycline Itching  . Rocephin [Ceftriaxone Sodium In Dextrose] Hives and Itching  . Sulfa Antibiotics Itching  . Vancomycin Hives and Itching  . Erythrocin Rash  . Penicillins Rash  . Sulfasalazine Rash    OPAT Orders Cefepime   Duration:  6 weeks  End Date:  10/20/201  [facility] Per Protocol:  Labs  weekly while on IV antibiotics: _x_ CBC with differential _x_ BMP w GFR/CMP _x_ CRP _x_ ESR    x__ Please pull PIC at completion of IV antibiotics __ Please leave PIC in place until doctor has seen patient or been notified  Fax weekly labs to [phone]   [name] has an appointment on 08/02/2020 at 1015 AM with Dr. West Bali  [facility] for Infectious Disease is located in the [facility]  [place] in [place].  [address], which is located to the left of the elevators.  Phone: [phone]  Fax: [phone]  [email or web address]   He should arrive 15 minutes prior to his appt.   LOS: 1 day   [name] 06/23/2020, 2:17 PM

## 2020-06-23 NOTE — TOC Transition Note (Signed)
Transition of Care [facility]) - CM/SW Discharge Note   Patient Details  Name: [name] MRN: [id number] Date of Birth: September 11, 1961  [facility]) CM/SW Contact:  Clearance Coots, LCSW Phone Number: 06/23/2020, 5:00 PM   Clinical Narrative:    Nurse confirm with PA.the patient will discharge today. CSW notified [facility] staff Rosey Bath. The agency will provide the patient evening dose. Per nurse, the patient dose was given at 2:00pm. Agency aware.  Spouse reports she has given the patient IV antibiotics in the past and will do so this time.  The agency will send a nurse to the home to provide education as well.  No other needs identified.   Final next level of care: Home w Home Health Services Barriers to Discharge: Barriers Resolved   Patient Goals and CMS Choice Patient states their goals for this hospitalization and ongoing recovery are:: return home with IV antibiotic therapy CMS Medicare.gov Compare Post Acute Care list provided to:: Patient Choice offered to / list presented to : Patient  Discharge Placement                       Discharge Plan and Services     Post Acute Care Choice: Home Health                               Social Determinants of Health (SDOH) Interventions     Readmission Risk Interventions No flowsheet data found.

## 2020-06-23 NOTE — Progress Notes (Signed)
   Subjective: 1 Day Post-Op Procedure(s) (LRB): Right knee resection arthroplasty, antibiotic spacer (Right) Patient reports pain as mild.   Patient seen in rounds by Dr. Lequita Halt. Patient is well, and has had no acute complaints or problems. Denies chest pain, SOB or calf pain. Plan is to go Home after hospital stay.  Objective: Vital signs in last 24 hours: Temp:  [97.3 F (36.3 C)-98.7 F (37.1 C)] 97.9 F (36.6 C) (09/09 0611) Pulse Rate:  [49-67] 63 (09/09 0611) Resp:  [9-20] 18 (09/09 0611) BP: (119-149)/(72-85) 144/76 (09/09 0611) SpO2:  [94 %-100 %] 97 % (09/09 0611) Weight:  [151.2 kg] 151.2 kg (09/08 1145)  Intake/Output from previous day:  Intake/Output Summary (Last 24 hours) at 06/23/2020 0745 Last data filed at 06/23/2020 7544 Gross per 24 hour  Intake 3685.35 ml  Output 1260 ml  Net 2425.35 ml    Intake/Output this shift: No intake/output data recorded.  Labs: Recent Labs    06/23/20 0332  HGB 11.1*   Recent Labs    06/23/20 0332  WBC 12.3*  RBC 4.08*  HCT 36.3*  PLT 301   Recent Labs    06/23/20 0332  NA 136  K 4.3  CL 105  CO2 25  BUN 18  CREATININE 1.03  GLUCOSE 152*  CALCIUM 8.1*   No results for input(s): LABPT, INR in the last 72 hours.  Exam: General - Patient is Alert and Oriented Extremity - Neurologically intact Neurovascular intact Sensation intact distally Dorsiflexion/Plantar flexion intact Dressing/Incision - clean, dry, no drainage Motor Function - intact, moving foot and toes well on exam.   Past Medical History:  Diagnosis Date  . Anemia    hx of  . Asthma   . Cellulitis of lower extremity    "usually RLL; this time it's got up to my lower abdoment" (02/11/2014)-series of antibiotics completed 02-28-14  . Diverticulosis   . Edema    legs  . Hypertension   . Infection of right knee (HCC)   . OSA (obstructive sleep apnea)    does not use cpap   . PONV (postoperative nausea and vomiting)   . Rheumatoid  arthritis (HCC) dx'd ~ 1977  . S/P PICC central line placement 03-16-14   right upper arm remains intact.05-11-14 removed 1 week ago.    Assessment/Plan: 1 Day Post-Op Procedure(s) (LRB): Right knee resection arthroplasty, antibiotic spacer (Right) Active Problems:   Septic arthritis of knee, right (HCC)  Estimated body mass index is 50.68 kg/m as calculated from the following:   Height as of this encounter: 5\' 8"  (1.727 m).   Weight as of this encounter: 151.2 kg. Advance diet Up with therapy D/C IV fluids   Hemovac drain pulled without difficulty.  DVT Prophylaxis - Aspirin Weight-bearing as tolerated  Intraop cultures showing no WBCs or organisms. Will continue to follow.  PICC line in place, antibiotic recommendations from ID given.  Possible discharge later today if cleared by ID. Follow-up in the office in 2 weeks.   , PA-C Orthopedic Surgery [phone] 06/23/2020, 7:45 AM

## 2020-06-23 NOTE — Progress Notes (Signed)
Discharge instructions given to patient, and all questions were answered.  Patient was taken in a wheelchair to main exit.

## 2020-06-23 NOTE — TOC Progression Note (Addendum)
Transition of Care [facility]) - Progression Note    Patient Details  Name: [name] MRN: [id number] Date of Birth: 23-Sep-1961  [facility]) CM/SW Contact  Clearance Coots, LCSW Phone Number: 06/23/2020, 8:46 AM  Clinical Narrative:    All requested documents faxed to [facility].  CSW confirm information received.  Waiting for a review/determine if they will provide IV infusion.   11:54AM-Checked in with Agency-still under review.    Expected Discharge Plan: Home w Home Health Services Barriers to Discharge: Continued Medical Work up  Expected Discharge Plan and Services Expected Discharge Plan: Home w Home Health Services     Post Acute Care Choice: Home Health Living arrangements for the past 2 months: Single Family Home                                       Social Determinants of Health (SDOH) Interventions    Readmission Risk Interventions No flowsheet data found.

## 2020-06-23 NOTE — Progress Notes (Signed)
Orthopedic Tech Progress Note Patient Details:  ERI PLATTEN 06-17-1961 [id number]  Ortho Devices Type of Ortho Device: Crutches Ortho Device/Splint Location: bed side Pt to work with patient Ortho Device/Splint Interventions: Ordered   Post Interventions Patient Tolerated: Well Instructions Provided: Adjustment of device, Care of device   Jennye Moccasin 06/23/2020, 9:24 AM

## 2020-06-23 NOTE — Progress Notes (Signed)
Physical Therapy Treatment Patient Details Name: [name] MRN: [id number] DOB: 03-15-1961 Today's Date: 06/23/2020    History of Present Illness Patient is 59 y.o. male s/p Rt TKR (resection and antibiotic spacer) on 06/22/20 with PMH significant for HTN, asthma, anemia, RA, Bil TKA with Rt in 93 and revision in 2015, Lt in 2008, and Bil THA.    PT Comments    Pt up to ambulate short distance and to negotiate stairs and then to bathroom.  Pt hopeful for dc home this date.   Follow Up Recommendations  Follow surgeon's recommendation for DC plan and follow-up therapies     Equipment Recommendations  Crutches    Recommendations for Other Services       Precautions / Restrictions Precautions Precautions: Fall Required Braces or Orthoses: Knee Immobilizer - Right Restrictions Weight Bearing Restrictions: No Other Position/Activity Restrictions: WBAT    Mobility  Bed Mobility Overal bed mobility: Needs Assistance Bed Mobility: Supine to Sit     Supine to sit: Min guard;HOB elevated Sit to supine: Min assist   General bed mobility comments: pt utilizing rails to assist with supine to sit  Transfers Overall transfer level: Needs assistance Equipment used: Rolling walker (2 wheeled) Transfers: Sit to/from Stand Sit to Stand: From elevated surface;Min guard;Supervision         General transfer comment: cues for LE management and use of UEs to self assist.    Ambulation/Gait Ambulation/Gait assistance: Min guard Gait Distance (Feet): 28 Feet (and 15' into bathroom) Assistive device: Rolling walker (2 wheeled) Gait Pattern/deviations: Step-to pattern;Decreased stance time - right;Decreased weight shift to right;Antalgic Gait velocity: decr   General Gait Details: min cues for sequence, posture and position from RW   Stairs Stairs: Yes Stairs assistance: Min assist Stair Management: No rails;Step to pattern;Backwards;With walker Number of Stairs: 2 General  stair comments: min cues for sequence and foot placement   Wheelchair Mobility    Modified Rankin (Stroke Patients Only)       Balance Overall balance assessment: Needs assistance Sitting-balance support: Feet supported Sitting balance-Leahy Scale: Good     Standing balance support: During functional activity;Bilateral upper extremity supported Standing balance-Leahy Scale: Fair Standing balance comment: reliant on external support for gait                            Cognition Arousal/Alertness: Awake/alert Behavior During Therapy: WFL for tasks assessed/performed Overall Cognitive Status: Within Functional Limits for tasks assessed                                        Exercises Total Joint Exercises Ankle Circles/Pumps: AROM;Both;20 reps;Seated    General Comments        Pertinent Vitals/Pain Pain Assessment: 0-10 Pain Score: 5  Pain Location: Rt knee with attempts to WB Pain Descriptors / Indicators: Discomfort;Aching;Sore;Grimacing Pain Intervention(s): Limited activity within patient's tolerance;Monitored during session;Premedicated before session    Home Living                      Prior Function            PT Goals (current goals can now be found in the care plan section) Acute Rehab PT Goals Patient Stated Goal: get home for the weekend PT Goal Formulation: With patient Time For Goal Achievement: 06/29/20 Potential to Achieve Goals:  Good Progress towards PT goals: Progressing toward goals    Frequency    7X/week      PT Plan Current plan remains appropriate    Co-evaluation              AM-PAC PT "6 Clicks" Mobility   Outcome Measure  Help needed turning from your back to your side while in a flat bed without using bedrails?: A Little Help needed moving from lying on your back to sitting on the side of a flat bed without using bedrails?: A Little Help needed moving to and from a bed to a chair  (including a wheelchair)?: A Little Help needed standing up from a chair using your arms (e.g., wheelchair or bedside chair)?: A Little Help needed to walk in hospital room?: A Little Help needed climbing 3-5 steps with a railing? : A Little 6 Click Score: 18    End of Session Equipment Utilized During Treatment: Gait belt;Right knee immobilizer Activity Tolerance: Patient tolerated treatment well;Patient limited by pain Patient left: Other (comment) (bathroom on comode) Nurse Communication: Mobility status PT Visit Diagnosis: Muscle weakness (generalized) (M62.81);Difficulty in walking, not elsewhere classified (R26.2);Pain Pain - Right/Left: Right Pain - part of body: Knee     Time: 1140-1201 PT Time Calculation (min) (ACUTE ONLY): 21 min  Charges:  $Gait Training: 8-22 mins                     Mauro Kaufmann PT Acute Rehabilitation Services Pager [phone] Office [phone]    Sharilynn Cassity 06/23/2020, 12:25 PM

## 2020-06-24 ENCOUNTER — Encounter (HOSPITAL_COMMUNITY): Payer: Self-pay | Admitting: Pharmacist

## 2020-06-24 ENCOUNTER — Telehealth: Payer: Self-pay | Admitting: Infectious Disease

## 2020-06-24 NOTE — Telephone Encounter (Signed)
I spoke with Dr. Daiva Eves and called [facility] . I spoke with their pharmacist, Glenna Durand, who is taking care of Mr. Lesnick. I discontinued the prescription for cefepime and called in Zosyn 13.5 gm daily as a continuous infusion to cover the enterococcus as well as gram negatives and the staph lugdunensis. They confirmed the order and will get him started on the Zosyn on a pump this evening.    Sharin Mons, PharmD, BCPS, BCIDP Infectious Diseases Clinical Pharmacist Phone: [phone]

## 2020-06-24 NOTE — Telephone Encounter (Signed)
Patient growing E faecalis from onE of his operative culture sites  Cefepime he is on will not cover this  I think we should move to go to Imipenem or meropenem to cover the E faecalis (presuming it to be AMP S) + the Staph lugdunensis and the GNR that might have been obscured on culture by his chronic cipro  I have texted Irving Burton just now re this.  He has a [facility]

## 2020-06-24 NOTE — Progress Notes (Signed)
Also noted that patient has a penicillin allergy. It was a remote history and he has notably tolerated Augmentin in the past. [facility] will dispense an anaphylaxis kit as a precaution for him.

## 2020-06-25 LAB — BODY FLUID CULTURE: Gram Stain: NONE SEEN

## 2020-06-27 LAB — AEROBIC/ANAEROBIC CULTURE W GRAM STAIN (SURGICAL/DEEP WOUND): Gram Stain: NONE SEEN

## 2020-06-27 LAB — ANAEROBIC CULTURE: Gram Stain: NONE SEEN

## 2020-06-27 NOTE — Telephone Encounter (Signed)
Probiotics will not help here. He can use some lomotil sinc ethis does NOT sound like CDI

## 2020-06-27 NOTE — Telephone Encounter (Signed)
I dont see any drug interactions between that and Zosyn.. I would assume it is ok to continue.

## 2020-06-27 NOTE — Telephone Encounter (Signed)
Patient's wife called with update on patient. He was switched to Zosyn over the weekend, now notes that his "stomach is rolling like he has diarrhea." The feeling is worse after the 2nd dose of zosyn, the feeling occurs several times a day.  Eating and drinking fluids well, his symptoms are unchanged with food.  He does have some diarrhea, started Sunday, 3 episodes Sunday, 1 episode of loose/watery stool this morning. Denies any unusually foul smell. They would like to know if a daily probiotic would help.  He also has used 1/2 dose of kaopectate with improvement in symptoms. Please advise. Andree Coss, RN

## 2020-06-27 NOTE — Discharge Summary (Signed)
Physician Discharge Summary   Patient ID: NOE GOYER MRN: [id number] DOB/AGE: 15-Aug-1961 59 y.o.  Admit date: 06/22/2020  Discharge date: 06/23/2020  Primary Diagnosis: Infected right total knee arthroplasty   Admission Diagnoses:  Past Medical History:  Diagnosis Date  . Anemia    hx of  . Asthma   . Cellulitis of lower extremity    "usually RLL; this time it's got up to my lower abdoment" (02/11/2014)-series of antibiotics completed 02-28-14  . Diverticulosis   . Edema    legs  . Hypertension   . Infection of right knee ([place])   . OSA (obstructive sleep apnea)    does not use cpap   . PONV (postoperative nausea and vomiting)   . Rheumatoid arthritis ([place]) dx'd ~ 1977  . S/P PICC central line placement 03-16-14   right upper arm remains intact.05-11-14 removed 1 week ago.   Discharge Diagnoses:   Active Problems:   Septic arthritis of knee, right (HCC)  Estimated body mass index is 50.68 kg/m as calculated from the following:   Height as of this encounter: $RemoveBeforeD'5\' 8"'[id number]$  (1.727 m).   Weight as of this encounter: 151.2 kg.  Procedure:  Procedure(s) (LRB): Right knee resection arthroplasty, antibiotic spacer (Right)   Consults: ID  HPI: The patient is a 59 year old male with a long complex history in regard to his right knee.  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  He presents now for resection arthroplasty and antibiotic spacer.  Laboratory Data: Admission on 06/22/2020, Discharged on 06/23/2020  Component Date Value Ref Range Status  . Specimen Description 06/22/2020 TISSUE RT KNEE   Final  . Special Requests 06/22/2020 NONE   Final  . Gram Stain 06/22/2020    Final                   Value:NO WBC SEEN NO ORGANISMS SEEN   . Culture 06/22/2020    Final                   Value:RARE ENTEROCOCCUS FAECALIS NO ANAEROBES ISOLATED; CULTURE IN PROGRESS FOR 5 DAYS   . Report Status 06/22/2020 PENDING    Incomplete  . Organism ID, Bacteria 06/22/2020 ENTEROCOCCUS FAECALIS   Final  . Specimen Description 06/22/2020    Final                   Value:SYNOVIAL RT KNEE Performed at [facility], [place] [address], [place], [place] [address]   . Special Requests 06/22/2020    Final                   Value:NONE Performed at [facility], [place] [address], [place], [place] [address]   . Gram Stain 06/22/2020    Final                   Value:NO WBC SEEN NO ORGANISMS SEEN Performed at [facility], [place] [address] [place], [place] [address]   . Culture 06/22/2020 NO ANAEROBES ISOLATED; CULTURE IN PROGRESS FOR 5 DAYS   Final  . Report Status 06/22/2020 PENDING   Incomplete  . Specimen Description 06/22/2020    Final                   Value:SYNOVIAL RT KNEE Performed at [facility], [place] [address] [place], [place] [address]   .  Special Requests 06/22/2020    Final                   Value:NONE Performed at [facility], [address] [place], [place] [address]   . Gram Stain 06/22/2020    Final                   Value:NO WBC SEEN NO ORGANISMS SEEN   . Culture 06/22/2020    Final                   Value:RARE ENTEROCOCCUS FAECALIS CRITICAL RESULT CALLED TO, READ BACK BY AND VERIFIED WITH: RN F.KUSITZ AT 1227 ON 06/23/2020 BY T.SAAD Performed at [facility], [address] [place], [place] [address]   . Report Status 06/22/2020 06/25/2020 FINAL   Final  . Organism ID, Bacteria 06/22/2020 ENTEROCOCCUS FAECALIS   Final  . WBC 06/23/2020 12.3* 4.0 - 10.5 K/uL Final  . RBC 06/23/2020 4.08* 4.22 - 5.81 MIL/uL Final  . Hemoglobin 06/23/2020 11.1* 13.0 - 17.0 g/dL Final  . HCT 29/08/5528 36.3* 39 - 52 % Final  . MCV 06/23/2020 89.0  80.0 - 100.0 fL Final  . MCH 06/23/2020 27.2  26.0 - 34.0 pg Final  . MCHC 06/23/2020 30.6  30.0 - 36.0 g/dL Final  . RDW 19/77/7154 16.6* 11.5 - 15.5 % Final  . Platelets  06/23/2020 301  150 - 400 K/uL Final  . nRBC 06/23/2020 0.0  0.0 - 0.2 % Final   Performed at [facility], [address] [place], [place] [address]  . Sodium 06/23/2020 136  135 - 145 mmol/L Final  . Potassium 06/23/2020 4.3  3.5 - 5.1 mmol/L Final  . Chloride 06/23/2020 105  98 - 111 mmol/L Final  . CO2 06/23/2020 25  22 - 32 mmol/L Final  . Glucose, Bld 06/23/2020 152* 70 - 99 mg/dL Final   Glucose reference range applies only to samples taken after fasting for at least 8 hours.  . BUN 06/23/2020 18  6 - 20 mg/dL Final  . Creatinine, Ser 06/23/2020 1.03  0.61 - 1.24 mg/dL Final  . Calcium 03/41/8885 8.1* 8.9 - 10.3 mg/dL Final  . GFR calc non Af Amer 06/23/2020 >60  >60 mL/min Final  . GFR calc Af Amer 06/23/2020 >60  >60 mL/min Final  . Anion gap 06/23/2020 6  5 - 15 Final   Performed at [facility], [address] [place], [place] [address]  Hospital Outpatient Visit on 06/18/2020  Component Date Value Ref Range Status  . SARS Coronavirus 2 06/18/2020 NEGATIVE  NEGATIVE Final   Comment: (NOTE) SARS-CoV-2 target nucleic acids are NOT DETECTED.  The SARS-CoV-2 RNA is generally detectable in upper and lower respiratory specimens during the acute phase of infection. Negative results do not preclude SARS-CoV-2 infection, do not rule out co-infections with other pathogens, and should not be used as the sole basis for treatment or other patient management decisions. Negative results must be combined with clinical observations, patient history, and epidemiological information. The expected result is Negative.  Fact Sheet for Patients: HairSlick.no  Fact Sheet for Healthcare Providers: quierodirigir.com  This test is not yet approved or cleared by the [place] FDA and  has been authorized for detection and/or diagnosis of SARS-CoV-2 by FDA under an Emergency Use Authorization (EUA).  This EUA will remain  in effect (meaning this test can be used) for the duration of the  COVID-19 declaration under Se                          ction 564(b)(1) of the Act, 21 U.S.C. section 360bbb-3(b)(1), unless the authorization is terminated or revoked sooner.  Performed at [facility], [place] [address], [place], [place] [address]   Hospital Outpatient Visit on 06/15/2020  Component Date Value Ref Range Status  . aPTT 06/15/2020 29  24 - 36 seconds Final   Performed at [facility], [place] [address] [place], [place] [address]  . WBC 06/15/2020 6.7  4.0 - 10.5 K/uL Final  . RBC 06/15/2020 4.27  4.22 - 5.81 MIL/uL Final  . Hemoglobin 06/15/2020 11.6* 13.0 - 17.0 g/dL Final  . HCT 06/15/2020 37.7* 39 - 52 % Final  . MCV 06/15/2020 88.3  80.0 - 100.0 fL Final  . MCH 06/15/2020 27.2  26.0 - 34.0 pg Final  . MCHC 06/15/2020 30.8  30.0 - 36.0 g/dL Final  . RDW 06/15/2020 16.4* 11.5 - 15.5 % Final  . Platelets 06/15/2020 273  150 - 400 K/uL Final  . nRBC 06/15/2020 0.0  0.0 - 0.2 % Final   Performed at [facility], [place] [address], [place], [place] [address]  . Sodium 06/15/2020 141  135 - 145 mmol/L Final  . Potassium 06/15/2020 4.2  3.5 - 5.1 mmol/L Final  . Chloride 06/15/2020 110  98 - 111 mmol/L Final  . CO2 06/15/2020 25  22 - 32 mmol/L Final  . Glucose, Bld 06/15/2020 92  70 - 99 mg/dL Final   Glucose reference range applies only to samples taken after fasting for at least 8 hours.  . BUN 06/15/2020 21* 6 - 20 mg/dL Final  . Creatinine, Ser 06/15/2020 0.89  0.61 - 1.24 mg/dL Final  . Calcium 06/15/2020 8.3* 8.9 - 10.3 mg/dL Final  . Total Protein 06/15/2020 6.7  6.5 - 8.1 g/dL Final  . Albumin 06/15/2020 3.2* 3.5 - 5.0 g/dL Final  . AST 06/15/2020 20  15 - 41 U/L Final  . ALT 06/15/2020 16  0 - 44 U/L Final  . Alkaline Phosphatase 06/15/2020 52  38 - 126 U/L Final  . Total Bilirubin 06/15/2020 0.4  0.3 - 1.2 mg/dL Final  . GFR calc  non Af Amer 06/15/2020 >60  >60 mL/min Final  . GFR calc Af Amer 06/15/2020 >60  >60 mL/min Final  . Anion gap 06/15/2020 6  5 - 15 Final   Performed at [facility], [place] [address], [place], [place] [address]  . Prothrombin Time 06/15/2020 13.9  11.4 - 15.2 seconds Final  . INR 06/15/2020 1.1  0.8 - 1.2 Final   Comment: (NOTE) INR goal varies based on device and disease states. Performed at [facility], [place] [address] [place], [place] [address]   . ABO/RH(D) 06/15/2020 O POS   Final  . Antibody Screen 06/15/2020 NEG   Final  . Sample Expiration 06/15/2020 06/25/2020,2359   Final  . Extend sample reason 06/15/2020    Final                   Value:NO TRANSFUSIONS OR PREGNANCY IN THE PAST 3 MONTHS Performed at [facility], [place] [address] [place], [place] [address]   . MRSA, PCR 06/15/2020 NEGATIVE  NEGATIVE Final  . Staphylococcus aureus 06/15/2020 NEGATIVE  NEGATIVE Final   Comment: (NOTE) The Xpert SA Assay (  FDA approved for NASAL specimens in patients 2 years of age and older), is one component of a comprehensive surveillance program. It is not intended to diagnose infection nor to guide or monitor treatment. Performed at [facility], [place] [address], [place], [place] [address]      X-Rays:US EKG SITE RITE  Result Date: 06/22/2020 If Site Rite image not attached, placement could not be confirmed due to current cardiac rhythm.  [place] EKG SITE RITE  Result Date: 06/22/2020 If Site Rite image not attached, placement could not be confirmed due to current cardiac rhythm.   EKG: Orders placed or performed during the hospital encounter of 11/13/19  . EKG 12 lead  . EKG 12 lead     Hospital Course: DAVIDLEE JEANBAPTISTE is a 59 y.o. who was admitted to [facility]. They were brought to the operating room on 06/22/2020 and underwent Procedure(s): Right knee resection arthroplasty, antibiotic spacer.   Patient tolerated the procedure well and was later transferred to the recovery room and then to the orthopaedic floor for postoperative care. They were given PO and IV analgesics for pain control following their surgery. They were given 24 hours of postoperative antibiotics of  Anti-infectives (From admission, onward)   Start     Dose/Rate Route Frequency Ordered Stop   06/23/20 0000  ceFEPime (MAXIPIME) IVPB        2 g Intravenous Every 8 hours 06/23/20 0755 08/04/20 2359   06/22/20 2000  ciprofloxacin (CIPRO) tablet 750 mg  Status:  Discontinued        750 mg Oral 2 times daily 06/22/20 1149 06/22/20 1512   06/22/20 2000  ceFEPIme (MAXIPIME) 2 g in sodium chloride 0.9 % 100 mL IVPB  Status:  Discontinued        2 g 200 mL/hr over 30 Minutes Intravenous Every 8 hours 06/22/20 1512 06/23/20 2245   06/22/20 0631  clindamycin (CLEOCIN) 900 MG/50ML IVPB       Note to Pharmacy: Charmayne Sheer   : cabinet override      06/22/20 0631 06/22/20 0900   06/22/20 0630  clindamycin (CLEOCIN) IVPB 900 mg        900 mg 100 mL/hr over 30 Minutes Intravenous On call to O.R. 06/22/20 1914 06/22/20 7829     and started on DVT prophylaxis in the form of Aspirin.   PT and OT were ordered for total joint protocol. Discharge planning consulted to help with postop disposition and equipment needs. PICC line was placed on day of surgery and infectious disease was consulted. Patient had a good night on the evening of surgery. They started to get up OOB with therapy on POD #1. Pt was seen during rounds and was ready to go home pending clearance by ID. Antibiotic recommendations were made and patient was discharged to home later that day in stable condition.   Per ID: 1. Start cefepime 2. PICC line 3. 6 weeks of IV therapy with followup 4. F/u intra-operative cultures  Diet: Regular diet Activity: WBAT Follow-up: in 2 weeks Disposition: Home with home health Discharged Condition: stable   Discharge  Instructions    [facility] pharmacist to adjust dose for Vancomycin, Aminoglycosides and other anti-infective therapies as requested by physician.   Complete by: As directed    [facility] to provide Cath Flo $Remove'2mg'glsKfmd$    Complete by: As directed    Administer for PICC line occlusion and as ordered by physician for other access device issues.  Anaphylaxis Kit: Provided to treat any anaphylactic reaction to the medication being provided to the patient if First Dose or when requested by physician   Complete by: As directed    Epinephrine 1mg /ml vial / amp: Administer 0.3mg  (0.64ml) subcutaneously once for moderate to severe anaphylaxis, nurse to call physician and pharmacy when reaction occurs and call 911 if needed for immediate care   Diphenhydramine 50mg /ml IV vial: Administer 25-50mg  IV/IM PRN for first dose reaction, rash, itching, mild reaction, nurse to call physician and pharmacy when reaction occurs   Sodium Chloride 0.9% NS 530ml IV: Administer if needed for hypovolemic blood pressure drop or as ordered by physician after call to physician with anaphylactic reaction   Change dressing on IV access line weekly and PRN   Complete by: As directed    Flush IV access with Sodium Chloride 0.9% and Heparin 10 units/ml or 100 units/ml   Complete by: As directed    Home infusion instructions - [facility]   Complete by: As directed    Instructions: Flush IV access with Sodium Chloride 0.9% and Heparin 10units/ml or 100units/ml   Change dressing on IV access line: Weekly and PRN   Instructions Cath Flo 2mg : Administer for PICC Line occlusion and as ordered by physician for other access device   [facility] pharmacist to adjust dose for: Vancomycin, Aminoglycosides and other anti-infective therapies as requested by physician   Method of administration may be changed at the discretion of home infusion pharmacist based upon assessment of the patient and/or  caregiver's ability to self-administer the medication ordered   Complete by: As directed      Allergies as of 06/23/2020      Reactions   Avelox [moxifloxacin Hcl In Nacl] Hives, Shortness Of Breath, Itching   Tolerates Cipro   Doxycycline Itching   Rocephin [ceftriaxone Sodium In Dextrose] Hives, Itching   Sulfa Antibiotics Itching   Vancomycin Hives, Itching   Erythrocin Rash   Penicillins Rash   Sulfasalazine Rash      Medication List    STOP taking these medications   ciprofloxacin 750 MG tablet Commonly known as: CIPRO   ibuprofen 200 MG tablet Commonly known as: ADVIL     TAKE these medications   aspirin 325 MG EC tablet Take 1 tablet (325 mg total) by mouth daily with breakfast for 20 days. Then take one 81 mg aspirin once a day for three weeks. Then discontinue aspirin.   ceFEPime  IVPB Commonly known as: MAXIPIME Inject 2 g into the vein every 8 (eight) hours. Indication:  PJI s/p hardware removal First Dose: Yes Last Day of Therapy:  08/03/2020 Labs - Once weekly:  CBC/D and BMP, Labs - Every other week:  ESR and CRP Method of administration: IV Push Method of administration may be changed at the discretion of home infusion pharmacist based upon assessment of the patient and/or caregiver's ability to self-administer the medication ordered.   Combivent Respimat 20-100 MCG/ACT Aers respimat Generic drug: Ipratropium-Albuterol Inhale 1 puff into the lungs every 6 (six) hours as needed for wheezing or shortness of breath.   diltiazem 240 MG 24 hr capsule Commonly known as: CARDIZEM CD Take 240 mg by mouth daily.   Fluticasone-Salmeterol 250-50 MCG/DOSE Aepb Commonly known as: ADVAIR Inhale 1 puff 2 (two) times daily as needed into the lungs (for shortness of breath or wheezing).   folic acid 1 MG tablet Commonly known as: FOLVITE Take 1 mg by mouth daily.  losartan 100 MG tablet Commonly known as: COZAAR Take 100 mg by mouth daily.   methocarbamol 500  MG tablet Commonly known as: ROBAXIN Take 1 tablet (500 mg total) by mouth every 6 (six) hours as needed for muscle spasms.   methotrexate 2.5 MG tablet Commonly known as: RHEUMATREX Take 20 mg by mouth every Monday. Caution:Chemotherapy. Protect from light.   metoprolol succinate 100 MG 24 hr tablet Commonly known as: TOPROL-XL Take 100 mg by mouth daily. Take with or immediately following a meal.   oxyCODONE 5 MG immediate release tablet Commonly known as: Oxy IR/ROXICODONE Take 1-2 tablets (5-10 mg total) by mouth every 6 (six) hours as needed for severe pain.   RABEprazole 20 MG tablet Commonly known as: ACIPHEX Take 20 mg by mouth daily before breakfast.   traMADol 50 MG tablet Commonly known as: ULTRAM Take 1-2 tablets (50-100 mg total) by mouth every 6 (six) hours as needed for moderate pain.   triamterene-hydrochlorothiazide 37.5-25 MG tablet Commonly known as: MAXZIDE-25 Take 1 tablet by mouth daily.            Discharge Care Instructions  (From admission, onward)         Start     Ordered   06/23/20 0000  Change dressing on IV access line weekly and PRN  (Home infusion instructions - [facility] )        06/23/20 0755          Follow-up Information    Gaynelle Arabian, MD. Schedule an appointment as soon as possible for a visit in 2 week(s).   Specialty: Orthopedic Surgery Contact information: [address] [place] [address] [phone]               Signed: Theresa Duty, PA-C Orthopedic Surgery 06/27/2020, 8:03 AM

## 2020-06-27 NOTE — Telephone Encounter (Signed)
Is the kaopectate that he has been taking ok to continue? He said that made his stomach symptoms less bothersome.

## 2020-06-27 NOTE — Telephone Encounter (Signed)
Relayed to patient's wife. They will contact us if his symptoms worsen.

## 2020-06-27 NOTE — Telephone Encounter (Signed)
Agree that probiotics will not help!

## 2020-07-02 ENCOUNTER — Telehealth: Payer: Self-pay | Admitting: Infectious Disease

## 2020-07-02 MED ORDER — DIPHENOXYLATE-ATROPINE 2.5-0.025 MG/5ML PO LIQD: 5.0000 mL | Freq: Four times a day (QID) | ORAL | 4 refills | Status: DC | PRN

## 2020-07-02 MED ORDER — FLUCONAZOLE 100 MG PO TABS: 100.0000 mg | ORAL_TABLET | Freq: Every day | ORAL | 14 refills | Status: DC

## 2020-07-02 MED ORDER — NYSTATIN 100000 UNIT/GM EX POWD: 1.0000 "application " | Freq: Three times a day (TID) | CUTANEOUS | 5 refills | Status: AC

## 2020-07-03 NOTE — Telephone Encounter (Signed)
I received a phone call on Saturday regarding this patient.  He is still having loose bowel movements but had not started Lomotil which I requested be prescribed.  His bowel movements were about 2-3 a day.  He is also developing erythema on his buttocks and also redness on his penis and sores in his mouth.  It sounded to me as if he is suffering from candidal infection in the oropharynx penis and possible buttocks.  I have prescribed fluconazole as well as topical nystatin to be used on his bottom.  I have also called in Lomotil.  Can we check in on him on Monday to make sure he is doing okay

## 2020-07-04 NOTE — Telephone Encounter (Signed)
He should be seen by myself or another provider in clinic

## 2020-07-04 NOTE — Telephone Encounter (Signed)
Spoke with patient's wife [name] regarding the patient. She states the patient started the fluconazole and topical nystatin on Saturday. She reports a lot of redness and itching around the penis and has now spread to his upper inner thigh. The wife states she has not noticed much of a difference. The anal area has some redness and skin fissures. Patient's mouth sores have improved.  Rhegan Trunnell T Pricilla Loveless

## 2020-07-05 NOTE — Telephone Encounter (Signed)
Can patient be scheduled?

## 2020-07-05 NOTE — Telephone Encounter (Signed)
Yes

## 2020-07-05 NOTE — Telephone Encounter (Signed)
No that's ok I'm glad he's better

## 2020-07-05 NOTE — Telephone Encounter (Signed)
Spoke with patient this morning and he reports he is a lot better than he was yesterday and does not feel he needs to come in today. He states he will wait for his follow up appointment.  Jolanda Mccann T Pricilla Loveless

## 2020-08-02 ENCOUNTER — Ambulatory Visit (INDEPENDENT_AMBULATORY_CARE_PROVIDER_SITE_OTHER): Payer: 59 | Admitting: Infectious Diseases

## 2020-08-02 ENCOUNTER — Encounter: Payer: Self-pay | Admitting: Infectious Diseases

## 2020-08-02 ENCOUNTER — Other Ambulatory Visit: Payer: Self-pay

## 2020-08-02 VITALS — BP 137/86 | HR 67 | Temp 97.6°F

## 2020-08-02 DIAGNOSIS — Z5181 Encounter for therapeutic drug level monitoring: Secondary | ICD-10-CM | POA: Diagnosis not present

## 2020-08-02 DIAGNOSIS — T8453XD Infection and inflammatory reaction due to internal right knee prosthesis, subsequent encounter: Secondary | ICD-10-CM | POA: Diagnosis not present

## 2020-08-02 MED ORDER — AMOXICILLIN-POT CLAVULANATE 875-125 MG PO TABS: 1.0000 | ORAL_TABLET | Freq: Two times a day (BID) | ORAL | 5 refills | Status: DC

## 2020-08-02 NOTE — Assessment & Plan Note (Signed)
Will follow weekly labs

## 2020-08-02 NOTE — Progress Notes (Signed)
[facility]                                                             [place], [place], [place], [address]                                                                  Phn. [phone]; Fax: [phone]                                                                             Date: 08/02/20  Reason for Follow Up: Hospital Follow up for recurrent Rt knee PJI   Assessment Recurrent Rt knee PJI  Rheumatoid Arthritis on Methotrexate  Medication monitoring  ESR 45>63>79>44>47 CRP 5.4>2.4>1.7>2.4 Weekly labs reviewed@ careeverywhere  Plan Continue Pip/tazo as with a plan to continue 6 weeks of therapy. End date 10/22 after which patient will be started on Augmentin 875/165m PO BID for approx 6 months.  Follow up with Ortho Follow up with me in 2 months  I spent greater than 25  minutes with the patient including greater than 50% of time in face to face counsel of the patient and in coordination of their care.    [facility]  Office phone [phone] no. [phone]______________________________________________________________________________________________________________________  HPI: Vincent Rufois a 59y.o. male y.o. male with a PMH of RA on Methotrexate 246mPO weekly , Bilateral Hip replacements and Bilateral knee replacements, RT TKA done in the 1990s with revision in 2012 and serratia PJI in 2015- treated with ceftriaxone x 6 weeks after resection arthroplasty.  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 HaJohnnye Simafter developing a vancomycin allergy. He completed 6 weeks and stopped after that.  With allergy history no oral continuation therapy was done. He  came back in December 2017 with knee pain,  swelling c/w PJI. No OR cx growth and treated for 6 more weeks with daptomycin after I and D with polyexchange. Dr. CoLinus Salmonsollowed him closely and placed him on oral augmentin in February of 2018 and completed approx. 5 months of therapy( feb/2018-04/2017)   This was followed by further I and D with polyexchange in November of 2018 for recurrent rt knee effusion( OR Cx No growth) and feb 2021 for recurrent pain and swelling ( OR cx no growth)    He had an aspirate of his knee that grew Citrobacter Koseri that was fairly S. Then another I and D with polyexchange in May of 2021 ( OR cx No growth). He has been on protracted oral ciprofloxacin but was having loosening of his prosthesis and there was concern  for progressive infection. He underwent I and D with resection arthroplasty with antibiotic spacer placement on 06/2020 with OR cultures growing Amp S E faecalis.    He is seen by Dr. Jefm Bryant for his RA and only on MTX 70m PO daily.  He says he has seen Dr AWynelle Linkapprox 3 times after his recent hospital discharge. He had some bleeding from his incision site when it was aspirated at the office visit and was found to be bloody.   He denies any pain/tenderness/discharge from the Rt knee. Swelling is present but says its decreasing. Denies any side effects related to the antibiotics. He has minimal irritation of the skin nearby the PICC line site but no frank erythema/tenderness or swelling. He has completed Fluconazole for candidal infection and the erythema seen in the buttocks and penis/soreness in his mouth is resolved.  ROS: 10 point ROS negative except as above  Past Medical History:  Diagnosis Date  . Anemia    hx of  . Asthma   . Cellulitis of lower extremity    "usually RLL; this time it's got up to my lower abdoment" (02/11/2014)-series of antibiotics completed 02-28-14  . Diverticulosis   . Edema    legs  . Hypertension   . Infection of right knee ([place]   . OSA (obstructive sleep  apnea)    does not use cpap   . PONV (postoperative nausea and vomiting)   . Rheumatoid arthritis ([place] dx'd ~ 1977  . S/P PICC central line placement 03-16-14   right upper arm remains intact.05-11-14 removed 1 week ago.   Past Surgical History:  Procedure Laterality Date  . EXCISIONAL HEMORRHOIDECTOMY    . EXCISIONAL TOTAL KNEE ARTHROPLASTY WITH ANTIBIOTIC SPACERS Right 03/18/2014   Procedure: RIGHT KNEE RESECTION ARTHROPLASTY WITH ANTIBIOTIC SPACERS;  Surgeon: FGearlean Alf MD;  Location: [facility];  Service: Orthopedics;  Laterality: Right;  . EXCISIONAL TOTAL KNEE ARTHROPLASTY WITH ANTIBIOTIC SPACERS Right 06/22/2020   Procedure: Right knee resection arthroplasty, antibiotic spacer;  Surgeon: AGaynelle Arabian MD;  Location: [facility];  Service: Orthopedics;  Laterality: Right;  1285m  . FOREIGN BODY REMOVAL Left    "BB" removal above left eye-teen yrs.  . Marland KitchenERNIA REPAIR    . I & D KNEE WITH POLY EXCHANGE Right 04/25/2016   Procedure: RIGHT KNEE IRRIGATION AND DEBRIDEMENT WITH POLY EXCHANGE;  Surgeon: FrGaynelle ArabianMD;  [facility];  Service: Orthopedics;  Laterality: Right;  . I & D KNEE WITH POLY EXCHANGE Right 10/01/2016   Procedure: IRRIGATION AND DEBRIDEMENT KNEE WITH POLY EXCHANGE;  Surgeon: FrGaynelle ArabianMD;  [facility];  Service: Orthopedics;  Laterality: Right;  . I & D KNEE WITH POLY EXCHANGE Right 08/26/2017   Procedure: IRRIGATION AND DEBRIDEMENT KNEE WITH POLY EXCHANGE;  Surgeon: AlGaynelle ArabianMD;  [facility];  Service: Orthopedics;  Laterality: Right;  . I & D KNEE WITH POLY EXCHANGE Right 11/16/2019   Procedure: IRRIGATION AND DEBRIDEMENT KNEE WITH POLY EXCHANGE;  Surgeon: AlGaynelle ArabianMD;  [facility];  Service: Orthopedics;  Laterality: Right;  606m . I & D KNEE WITH POLY EXCHANGE Right 02/22/2020   Procedure: Right knee irrigation and debridement;  Surgeon: AluGaynelle ArabianD;  Location: [facility];  Service: Orthopedics;  Laterality: Right;  77m73m.  INCISION AND DRAINAGE Right 09/28/2014   Procedure: INCISION AND DRAINAGE RIGHT KNEE;  Surgeon: FranGearlean Alf;  [facility];  Service: Orthopedics;  Laterality: Right;  . KNEE  BURSECTOMY Right 03/13/2017   Procedure: RIGHT KNEE PREPATELLAR BURSECTOMY;  Surgeon: Gaynelle Arabian, MD;  Location: [facility];  Service: Orthopedics;  Laterality: Right;  . picc line placement Right    right upper arm  . 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 REVISION TOTAL KNEE ARTHROPLASTY Right 2012  . SPIGELIAN HERNIA Right 12/04/2015   Procedure: INCARCERATED SPIGELIAN HERNIA REPAIR WITH MESH;  Surgeon: Georganna Skeans, MD;  Location: [facility];  Service: General;  Laterality: Right;  . TOTAL KNEE ARTHROPLASTY Right 05/21/2014   Procedure: RIGHT KNEE ARTHROPLASTY REINPLANTATION;  Surgeon: [name], MD;  Location: [facility];  Service: Orthopedics;  Laterality: Right;   Current Outpatient Medications on File Prior to Visit  Medication Sig Dispense Refill  . diltiazem (CARDIZEM CD) 240 MG 24 hr capsule Take 240 mg by mouth daily.     . diphenoxylate-atropine (LOMOTIL) 2.5-0.025 MG/5ML liquid Take 5 mLs by mouth 4 (four) times daily as needed for diarrhea or loose stools. 60 mL 4  . fluconazole (DIFLUCAN) 100 MG tablet Take 1 tablet (100 mg total) by mouth daily. 10 tablet 14  . Fluticasone-Salmeterol (ADVAIR) 250-50 MCG/DOSE AEPB Inhale 1 puff 2 (two) times daily as needed into the lungs (for shortness of breath or wheezing).    . folic acid (FOLVITE) 1 MG tablet Take 1 mg by mouth daily.    . Ipratropium-Albuterol (COMBIVENT RESPIMAT) 20-100 MCG/ACT AERS respimat Inhale 1 puff into the lungs every 6 (six) hours as needed for wheezing or shortness of breath.    . losartan (COZAAR) 100 MG tablet Take 100 mg by mouth daily.     . methocarbamol (ROBAXIN) 500 MG tablet Take 1 tablet (500  mg total) by mouth every 6 (six) hours as needed for muscle spasms. 40 tablet 0  . methotrexate (RHEUMATREX) 2.5 MG tablet Take 20 mg by mouth every Monday. Caution:Chemotherapy. Protect from light.    . metoprolol succinate (TOPROL-XL) 100 MG 24 hr tablet Take 100 mg by mouth daily. Take with or immediately following a meal.    . nystatin (MYCOSTATIN/NYSTOP) powder Apply 1 application topically 3 (three) times daily. 30 g 5  . oxyCODONE (OXY IR/ROXICODONE) 5 MG immediate release tablet Take 1-2 tablets (5-10 mg total) by mouth every 6 (six) hours as needed for severe pain. 42 tablet 0  . RABEprazole (ACIPHEX) 20 MG tablet Take 20 mg by mouth daily before breakfast.     . traMADol (ULTRAM) 50 MG tablet Take 1-2 tablets (50-100 mg total) by mouth every 6 (six) hours as needed for moderate pain. 40 tablet 0  . triamterene-hydrochlorothiazide (MAXZIDE-25) 37.5-25 MG tablet Take 1 tablet by mouth daily.     No current facility-administered medications on file prior to visit.   Allergies  Allergen Reactions  . Avelox [Moxifloxacin Hcl In Nacl] Hives, Shortness Of Breath and Itching    Tolerates Cipro  . Doxycycline Itching  . Rocephin [Ceftriaxone Sodium In Dextrose] Hives and Itching  . Sulfa Antibiotics Itching  . Vancomycin Hives and Itching  . Erythrocin Rash  . Penicillins Rash    Tolerated augmentin   . Sulfasalazine Rash   Social History   Socioeconomic History  . Marital status: Married    Spouse name: Not on file  . Number of children: 1  . Years of education: 92  . Highest education level: Not on file  Occupational History  . Not on file  Tobacco Use  . Smoking status: Former Smoker    Packs/day: 1.00    Years: 3.00    Pack years: 3.00    Types: Cigarettes    Quit date: 03/16/1981    Years since quitting: 39.4  . Smokeless tobacco: Current User    Types: Snuff  . Tobacco comment: "quit smoking cigarettes in the 1980's"  Vaping Use  . Vaping Use: Never used  Substance  and Sexual Activity  . Alcohol use: No  . Drug use: No  . Sexual activity: Yes    Partners: Male  Other Topics Concern  . Not on file  Social History Narrative  . Not on file   Social Determinants of Health   Financial Resource Strain:   . Difficulty of Paying Living Expenses: Not on file  Food Insecurity:   . Worried About Charity fundraiser in the Last Year: Not on file  . Ran Out of Food in the Last Year: Not on file  Transportation Needs:   . Lack of Transportation (Medical): Not on file  . Lack of Transportation (Non-Medical): Not on file  Physical Activity:   . Days of Exercise per Week: Not on file  . Minutes of Exercise per Session: Not on file  Stress:   . Feeling of Stress : Not on file  Social Connections:   . Frequency of Communication with Friends and Family: Not on file  . Frequency of Social Gatherings with Friends and Family: Not on file  . Attends Religious Services: Not on file  . Active Member of Clubs or Organizations: Not on file  . Attends Archivist Meetings: Not on file  . Marital Status: Not on file  Intimate Partner Violence:   . Fear of Current or Ex-Partner: Not on file  . Emotionally Abused: Not on file  . Physically Abused: Not on file  . Sexually Abused: Not on file    Vitals BP 137/86   Pulse 67   Temp 97.6 F (36.4 C) (Oral)    Examination  General - not in acute distress, comfortably sitting in wheelchair, OBESE  HEENT - PEERLA, no pallor and no icterus, NO ERYTHEMA IN THE ORAL MUCOSA Chest - b/l clear air entry, no additional sounds CVS- Normal s1s2, RRR Abdomen - Soft, Non tender , non distended Ext- BILATERAL LEGS ARE WRAPPED IN A KNEE IMMOBILIZER. HE IS NWB.  Neuro: grossly normal Back - WNL Psych : calm and cooperative Skin- Rt arm PICC line - HAS MINIMAL SKIN IRRITATION    Recent labs CBC Latest Ref Rng & Units 06/23/2020 06/15/2020 02/19/2020  WBC 4.0 - 10.5 K/uL 12.3(H) 6.7 10.0  Hemoglobin 13.0 - 17.0 g/dL  11.1(L) 11.6(L) 12.1(L)  Hematocrit 39 - 52 % 36.3(L) 37.7(L) 39.8  Platelets 150 - 400 K/uL 301 273 384   CMP Latest Ref Rng & Units 06/23/2020 06/15/2020 02/19/2020  Glucose 70 - 99 mg/dL 152(H) 92 104(H)  BUN 6 - 20 mg/dL 18 21(H) 22(H)  Creatinine 0.61 - 1.24 mg/dL 1.03 0.89 1.01  Sodium 135 - 145 mmol/L 136 141 140  Potassium 3.5 - 5.1 mmol/L 4.3 4.2 4.5  Chloride 98 - 111 mmol/L 105 110 107  CO2 22 - 32 mmol/L _0 Calcium 8.9 - 10.3 mg/dL 8.1(L) 8.3(L) 8.9  Total Protein 6.5 - 8.1 g/dL - 6.7 -  Total Bilirubin 0.3 - 1.2 mg/dL - 0.4 -  Alkaline Phos 38 -  126 U/L - 52 -  AST 15 - 41 U/L - 20 -  ALT 0 - 44 U/L - 16 -    Pertinent Microbiology Results for orders placed or performed during the hospital encounter of 06/22/20  Aerobic/Anaerobic Culture (surgical/deep wound)     Status: None   Collection Time: 06/22/20  8:56 AM   Specimen: Joint, Other; Body Fluid  Result Value Ref Range Status   Specimen Description   Final    TISSUE RT KNEE Performed at [facility] [address], [place], [place] [address]    Special Requests   Final    NONE Performed at [facility], [place] [address], [place], [place] [address]    Gram Stain NO WBC SEEN NO ORGANISMS SEEN   Final   Culture   Final    RARE ENTEROCOCCUS FAECALIS NO ANAEROBES ISOLATED Performed at [facility], [place] [address], [place], [place] [address]    Report Status 06/27/2020 FINAL  Final   Organism ID, Bacteria ENTEROCOCCUS FAECALIS  Final      Susceptibility   Enterococcus faecalis - MIC*    AMPICILLIN <=2 SENSITIVE Sensitive     VANCOMYCIN <=0.5 SENSITIVE Sensitive     GENTAMICIN SYNERGY SENSITIVE Sensitive     * RARE ENTEROCOCCUS FAECALIS  Anaerobic culture     Status: None   Collection Time: 06/22/20  8:56 AM   Specimen: Synovium  Result Value Ref Range Status   Specimen Description   Final    SYNOVIAL RT KNEE Performed at [facility], [place] [address], [place], [place] [address]    Special Requests   Final    NONE Performed at [facility], [place] [address] [place], [place] [address]    Gram Stain NO WBC SEEN NO ORGANISMS SEEN   Final   Culture   Final    NO ANAEROBES ISOLATED Performed at [facility], [place] [address], [place], [place] [address]    Report Status 06/27/2020 FINAL  Final  Body fluid culture     Status: None   Collection Time: 06/22/20  8:56 AM   Specimen: Synovium  Result Value Ref Range Status   Specimen Description   Final    SYNOVIAL RT KNEE Performed at [facility] [address] [place], [place] [address]    Special Requests   Final    NONE Performed at [facility], [place] [address] [place], [place] [address]    Gram Stain NO WBC SEEN NO ORGANISMS SEEN   Final   Culture   Final    RARE ENTEROCOCCUS FAECALIS CRITICAL RESULT CALLED TO, READ BACK BY AND VERIFIED WITH: RN F.KUSITZ AT 8891 ON 06/23/2020 BY T.SAAD Performed at [facility], [place] [address] [place],  [address]    Report Status 06/25/2020 FINAL  Final   Organism ID, Bacteria ENTEROCOCCUS FAECALIS  Final      Susceptibility   Enterococcus faecalis - MIC*    AMPICILLIN <=2 SENSITIVE Sensitive     VANCOMYCIN 1 SENSITIVE Sensitive     GENTAMICIN SYNERGY SENSITIVE Sensitive     * RARE ENTEROCOCCUS FAECALIS     All pertinent labs/Imagings/notes reviewed. All pertinent plain films and CT images have been personally visualized and interpreted; radiology reports have been reviewed. Decision making incorporated into the Impression / Recommendations.

## 2020-08-02 NOTE — Assessment & Plan Note (Signed)
Continue Pip/tazo as with a plan to continue 6 weeks of therapy. End date 10/22 after which patient will be started on Augmentin 875/125mg  PO BID for approx 6 months.  Follow up with Ortho Follow up with me in 2 months

## 2020-08-03 ENCOUNTER — Telehealth: Payer: Self-pay

## 2020-08-03 NOTE — Telephone Encounter (Signed)
Received call from PharmD with [facility] wanting to confirm orders to remove picc line after IV therapy ends on 10/22. Orders confirmed. Verbal order read back and understood.  [name]

## 2020-09-15 ENCOUNTER — Encounter: Payer: Self-pay | Admitting: Infectious Diseases

## 2020-09-15 ENCOUNTER — Other Ambulatory Visit: Payer: Self-pay

## 2020-09-15 ENCOUNTER — Ambulatory Visit (INDEPENDENT_AMBULATORY_CARE_PROVIDER_SITE_OTHER): Payer: 59 | Admitting: Infectious Diseases

## 2020-09-15 VITALS — BP 123/79 | HR 65 | Temp 98.0°F

## 2020-09-15 DIAGNOSIS — Z5181 Encounter for therapeutic drug level monitoring: Secondary | ICD-10-CM

## 2020-09-15 DIAGNOSIS — T8453XA Infection and inflammatory reaction due to internal right knee prosthesis, initial encounter: Secondary | ICD-10-CM

## 2020-09-15 DIAGNOSIS — Z23 Encounter for immunization: Secondary | ICD-10-CM | POA: Insufficient documentation

## 2020-09-15 DIAGNOSIS — T8453XD Infection and inflammatory reaction due to internal right knee prosthesis, subsequent encounter: Secondary | ICD-10-CM

## 2020-09-15 DIAGNOSIS — R7401 Elevation of levels of liver transaminase levels: Secondary | ICD-10-CM

## 2020-09-15 DIAGNOSIS — T8450XD Infection and inflammatory reaction due to unspecified internal joint prosthesis, subsequent encounter: Secondary | ICD-10-CM

## 2020-09-15 MED ORDER — AMOXICILLIN-POT CLAVULANATE 875-125 MG PO TABS
1.0000 | ORAL_TABLET | Freq: Two times a day (BID) | ORAL | 0 refills | Status: DC
Start: 2020-09-15 — End: 2020-10-18

## 2020-09-15 NOTE — Assessment & Plan Note (Signed)
CBC, CMP, ESR and CRP today  °

## 2020-09-15 NOTE — Progress Notes (Addendum)
[facility]                                                             [place], [place], [place], [address]                                                                  Phn. [phone]; Fax: [phone]                                                                             Date: 09/15/2020   Reason for Follow Up: Hospital Follow up for recurrent Rt knee PJI   Assessment Recurrent Rt knee PJI ESR 45>63>79>44>47>44 CRP 5.6>5.4>2.4>1.7>2.49>2.15  Rheumatoid Arthritis on Methotrexate  Transaminitis 08/05/20 AST 101 and ALT 108, in the setting of Methotrexate and Augmentin  Medication monitoring  labs reviewed@ [email or web address]  Plan Patient has completed 6 weeks of IV Zosyn on 10/22 after which patient was started on Augmentin 875/$RemoveBeforeDE'125mg'JIDPnqkCyWTlYHH$  PO BID. Plan to continue Augmetin for approx 6 months.  CBC, CMP, ESR and CRP today  Ideally patient will need to be off abx for at least 2 weeks with joint aspiration for cell count and cx to determine whether to proceed with re-implantation Follow up with Ortho.  Follow up with me in 1 months  I spent greater than 25  minutes with the patient including greater than 50% of time in face to face counsel of the patient and in coordination of their care.   Rosiland Oz, MD [facility]  Office phone [phone] Fax no. [phone] ______________________________________________________________________________________________________________________  HPI: [name] is a 59 y.o. male y.o. male with a PMH of RA on Methotrexate $RemoveBeforeD'20mg'jgbdPZxaPCBwrJ$  PO weekly , Bilateral Hip replacements and Bilateral knee replacements, RT TKA done in the 1990s with revision in 2012 and serratia PJI in 2015- treated with ceftriaxone x 6 weeks after resection arthroplasty.  He again developed PJI with Staph lugdunensis (Meth S)  in 2017and  treated with daptomycin + rifampin after I and D +poly-exchange and followed by Dr. Johnnye Sima after developing a vancomycin allergy. He completed 6 weeks and stopped after that.  With allergy history no oral continuation therapy was done. He  came back in December 2017 with knee pain, swelling c/w PJI. No OR cx growth and treated for 6 more weeks with daptomycin after I and D with polyexchange. Dr. Linus Salmons followed him closely and placed him on oral augmentin in February of 2018 and completed approx. 5 months of therapy( feb/2018-04/2017)   This was followed by further I and D with polyexchange in November of 2018 for recurrent rt knee effusion( OR Cx No growth) and feb 2021 for recurrent pain and swelling ( OR cx no growth)    He  had an aspirate of his knee that grew Citrobacter Koseri that was fairly S. Then another I and D with polyexchange in May of 2021 ( OR cx No growth). He has been on protracted oral ciprofloxacin but was having loosening of his prosthesis and there was concern for progressive infection. He underwent I and D with resection arthroplasty with antibiotic spacer placement on 06/2020 with OR cultures growing Amp S E faecalis. He has completed 6 weeks of IV zosyn on 10/22 followed by PO augmentin BID since then.    He is seen by Dr. Jefm Bryant for his RA and only on MTX $Remo'20mg'txicF$  PO daily.  He was seen by  Dr Wynelle Link around October 2021 and had Rt knee aspirated at the office visit and was found to be bloody.   He is accompanied by his wife today. Denies any pain/tenderness in the Rt knee. Wife says there is minimal brownish colored fluid. Denies any swelling/erythema. Denies fever, chills and sweats. Denies any nausea, vomiting and abdominal pain. Has some loose stool but not too bothersome. They say he is on a disability insurance which will expire in march 2020 if he does not return to work by December 20, 2020 and asking if joint re-implantation can be done before the Christmas holidays so that he  has enough time to recover before he goes to work.    ROS: 10 point ROS negative except as above  Past Medical History:  Diagnosis Date  . Anemia    hx of  . Asthma   . Cellulitis of lower extremity    "usually RLL; this time it's got up to my lower abdoment" (02/11/2014)-series of antibiotics completed 02-28-14  . Diverticulosis   . Edema    legs  . Hypertension   . Infection of right knee ([place])   . OSA (obstructive sleep apnea)    does not use cpap   . PONV (postoperative nausea and vomiting)   . Rheumatoid arthritis ([place]) dx'd ~ 1977  . S/P PICC central line placement 03-16-14   right upper arm remains intact.05-11-14 removed 1 week ago.   Past Surgical History:  Procedure Laterality Date  . EXCISIONAL HEMORRHOIDECTOMY    . EXCISIONAL TOTAL KNEE ARTHROPLASTY WITH ANTIBIOTIC SPACERS Right 03/18/2014   Procedure: RIGHT KNEE RESECTION ARTHROPLASTY WITH ANTIBIOTIC SPACERS;  Surgeon: [name], MD;  Location: [facility];  Service: Orthopedics;  Laterality: Right;  . EXCISIONAL TOTAL KNEE ARTHROPLASTY WITH ANTIBIOTIC SPACERS Right 06/22/2020   Procedure: Right knee resection arthroplasty, antibiotic spacer;  Surgeon: Gaynelle Arabian, MD;  Location: [facility];  Service: Orthopedics;  Laterality: Right;  137min  . FOREIGN BODY REMOVAL Left    "BB" removal above left eye-teen yrs.  Mar[facility] Kitchen HERNIA REPAIR    . I & D KNEE WITH POLY EXCHANGE Right 04/25/2016   Procedure: RIGHT KNEE IRRIGATION AND DEBRIDEMENT WITH POLY EXCHANGE;  Surgeon: Gaynelle Arabian, MD;  Location: [facility];  Service: Orthopedics;  Laterality: Right;  . I & D KNEE WITH POLY EXCHANGE Right 10/01/2016   Procedure: IRRIGATION AND DEBRIDEMENT KNEE WITH POLY EXCHANGE;  Surgeon: Gaynelle Arabian, MD;  Location: [facility];  Service: Orthopedics;  Laterality: Right;  . I & D KNEE WITH POLY EXCHANGE Right 08/26/2017   Procedure: IRRIGATION AND DEBRIDEMENT KNEE WITH POLY EXCHANGE;  Surgeon: Gaynelle Arabian, MD;  Location: [facility];  Service: Orthopedics;   Laterality: Right;  . I & D KNEE WITH POLY EXCHANGE Right 11/16/2019   Procedure: IRRIGATION AND DEBRIDEMENT KNEE WITH POLY  EXCHANGE;  Surgeon: Gaynelle Arabian, MD;  Location: [facility];  Service: Orthopedics;  Laterality: Right;  22min  . I & D KNEE WITH POLY EXCHANGE Right 02/22/2020   Procedure: Right knee irrigation and debridement;  Surgeon: Gaynelle Arabian, MD;  Location: [facility];  Service: Orthopedics;  Laterality: Right;  49min  . INCISION AND DRAINAGE Right 09/28/2014   Procedure: INCISION AND DRAINAGE RIGHT KNEE;  Surgeon: [name], MD;  Location: [facility];  Service: Orthopedics;  Laterality: Right;  . KNEE BURSECTOMY Right 03/13/2017   Procedure: RIGHT KNEE PREPATELLAR BURSECTOMY;  Surgeon: Gaynelle Arabian, MD;  Location: [facility];  Service: Orthopedics;  Laterality: Right;  . picc line placement Right    right upper arm  . 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 REVISION TOTAL KNEE ARTHROPLASTY Right 2012  . SPIGELIAN HERNIA Right 12/04/2015   Procedure: INCARCERATED SPIGELIAN HERNIA REPAIR WITH MESH;  Surgeon: Georganna Skeans, MD;  Location: [facility];  Service: General;  Laterality: Right;  . TOTAL KNEE ARTHROPLASTY Right 05/21/2014   Procedure: RIGHT KNEE ARTHROPLASTY REINPLANTATION;  Surgeon: [name], MD;  Location: [facility];  Service: Orthopedics;  Laterality: Right;   Current Outpatient Medications on File Prior to Visit  Medication Sig Dispense Refill  . amoxicillin-clavulanate (AUGMENTIN) 875-125 MG tablet Take 1 tablet by mouth 2 (two) times daily. 60 tablet 5  . diltiazem (CARDIZEM CD) 240 MG 24 hr capsule Take 240 mg by mouth daily.     . diphenoxylate-atropine (LOMOTIL) 2.5-0.025 MG/5ML liquid Take 5 mLs by mouth 4 (four) times daily as needed for diarrhea or loose stools. 60 mL 4  . fluconazole (DIFLUCAN) 100 MG tablet Take 1 tablet (100 mg total)  by mouth daily. 10 tablet 14  . Fluticasone-Salmeterol (ADVAIR) 250-50 MCG/DOSE AEPB Inhale 1 puff 2 (two) times daily as needed into the lungs (for shortness of breath or wheezing).    . folic acid (FOLVITE) 1 MG tablet Take 1 mg by mouth daily.    . Ipratropium-Albuterol (COMBIVENT RESPIMAT) 20-100 MCG/ACT AERS respimat Inhale 1 puff into the lungs every 6 (six) hours as needed for wheezing or shortness of breath.    . losartan (COZAAR) 100 MG tablet Take 100 mg by mouth daily.     . methocarbamol (ROBAXIN) 500 MG tablet Take 1 tablet (500 mg total) by mouth every 6 (six) hours as needed for muscle spasms. 40 tablet 0  . methotrexate (RHEUMATREX) 2.5 MG tablet Take 20 mg by mouth every Monday. Caution:Chemotherapy. Protect from light.    . metoprolol succinate (TOPROL-XL) 100 MG 24 hr tablet Take 100 mg by mouth daily. Take with or immediately following a meal.    . nystatin (MYCOSTATIN/NYSTOP) powder Apply 1 application topically 3 (three) times daily. 30 g 5  . oxyCODONE (OXY IR/ROXICODONE) 5 MG immediate release tablet Take 1-2 tablets (5-10 mg total) by mouth every 6 (six) hours as needed for severe pain. 42 tablet 0  . RABEprazole (ACIPHEX) 20 MG tablet Take 20 mg by mouth daily before breakfast.     . traMADol (ULTRAM) 50 MG tablet Take 1-2 tablets (50-100 mg total) by mouth every 6 (six) hours as needed for moderate pain. 40 tablet 0  . triamterene-hydrochlorothiazide (MAXZIDE-25) 37.5-25 MG tablet Take 1 tablet by mouth daily.     No current facility-administered medications on file prior to visit.  Allergies  Allergen Reactions  . Avelox [Moxifloxacin Hcl In Nacl] Hives, Shortness Of Breath and Itching    Tolerates Cipro  . Doxycycline Itching  . Rocephin [Ceftriaxone Sodium In Dextrose] Hives and Itching  . Sulfa Antibiotics Itching  . Vancomycin Hives and Itching  . Erythrocin Rash  . Penicillins Rash    Tolerated augmentin   . Sulfasalazine Rash   Social History    Socioeconomic History  . Marital status: Married    Spouse name: Not on file  . Number of children: 1  . Years of education: 42  . Highest education level: Not on file  Occupational History  . Not on file  Tobacco Use  . Smoking status: Former Smoker    Packs/day: 1.00    Years: 3.00    Pack years: 3.00    Types: Cigarettes    Quit date: 03/16/1981    Years since quitting: 39.5  . Smokeless tobacco: Current User    Types: Snuff  . Tobacco comment: "quit smoking cigarettes in the 1980's"  Vaping Use  . Vaping Use: Never used  Substance and Sexual Activity  . Alcohol use: No  . Drug use: No  . Sexual activity: Yes    Partners: Male  Other Topics Concern  . Not on file  Social History Narrative  . Not on file   Social Determinants of Health   Financial Resource Strain:   . Difficulty of Paying Living Expenses: Not on file  Food Insecurity:   . Worried About Charity fundraiser in the Last Year: Not on file  . Ran Out of Food in the Last Year: Not on file  Transportation Needs:   . Lack of Transportation (Medical): Not on file  . Lack of Transportation (Non-Medical): Not on file  Physical Activity:   . Days of Exercise per Week: Not on file  . Minutes of Exercise per Session: Not on file  Stress:   . Feeling of Stress : Not on file  Social Connections:   . Frequency of Communication with Friends and Family: Not on file  . Frequency of Social Gatherings with Friends and Family: Not on file  . Attends Religious Services: Not on file  . Active Member of Clubs or Organizations: Not on file  . Attends Archivist Meetings: Not on file  . Marital Status: Not on file  Intimate Partner Violence:   . Fear of Current or Ex-Partner: Not on file  . Emotionally Abused: Not on file  . Physically Abused: Not on file  . Sexually Abused: Not on file    Vitals There were no vitals taken for this visit.   Examination  General - not in acute distress, comfortably  sitting in wheelchair, OBESE  HEENT - PEERLA, no pallor and no icterus Chest - b/l clear air entry, no additional sounds CVS- Normal s1s2, RRR Abdomen - Soft, Non tender , non distended Ext-      Neuro: grossly normal Back - WNL Psych : calm and cooperative Skin- no obvious rashes, lesions.   Recent labs CBC Latest Ref Rng & Units 06/23/2020 06/15/2020 02/19/2020  WBC 4.0 - 10.5 K/uL 12.3(H) 6.7 10.0  Hemoglobin 13.0 - 17.0 g/dL 11.1(L) 11.6(L) 12.1(L)  Hematocrit 39 - 52 % 36.3(L) 37.7(L) 39.8  Platelets 150 - 400 K/uL 301 273 384   CMP Latest Ref Rng & Units 06/23/2020 06/15/2020 02/19/2020  Glucose 70 - 99 mg/dL 152(H) 92 104(H)  BUN 6 - 20 mg/dL 18  21(H) 22(H)  Creatinine 0.61 - 1.24 mg/dL 1.03 0.89 1.01  Sodium 135 - 145 mmol/L 136 141 140  Potassium 3.5 - 5.1 mmol/L 4.3 4.2 4.5  Chloride 98 - 111 mmol/L 105 110 107  CO2 22 - 32 mmol/L $RemoveB'25 25 24  'jLSSMFIf$ Calcium 8.9 - 10.3 mg/dL 8.1(L) 8.3(L) 8.9  Total Protein 6.5 - 8.1 g/dL - 6.7 -  Total Bilirubin 0.3 - 1.2 mg/dL - 0.4 -  Alkaline Phos 38 - 126 U/L - 52 -  AST 15 - 41 U/L - 20 -  ALT 0 - 44 U/L - 16 -     Pertinent Microbiology Results for orders placed or performed during the hospital encounter of 06/22/20  Aerobic/Anaerobic Culture (surgical/deep wound)     Status: None   Collection Time: 06/22/20  8:56 AM   Specimen: Joint, Other; Body Fluid  Result Value Ref Range Status   Specimen Description   Final    TISSUE RT KNEE Performed at [facility] [address], [place], [place] [address]    Special Requests   Final    NONE Performed at [facility], [place] [address] [place], [place] [address]    Gram Stain NO WBC SEEN NO ORGANISMS SEEN   Final   Culture   Final    RARE ENTEROCOCCUS FAECALIS NO ANAEROBES ISOLATED Performed at [facility], [place] [address] [place], [place] [address]    Report Status 06/27/2020 FINAL  Final   Organism ID, Bacteria ENTEROCOCCUS FAECALIS   Final      Susceptibility   Enterococcus faecalis - MIC*    AMPICILLIN <=2 SENSITIVE Sensitive     VANCOMYCIN <=0.5 SENSITIVE Sensitive     GENTAMICIN SYNERGY SENSITIVE Sensitive     * RARE ENTEROCOCCUS FAECALIS  Anaerobic culture     Status: None   Collection Time: 06/22/20  8:56 AM   Specimen: Synovium  Result Value Ref Range Status   Specimen Description   Final    SYNOVIAL RT KNEE Performed at [facility], [place] [address], [place], [place] [address]    Special Requests   Final    NONE Performed at [facility], [place] [address] [place], [place] [address]    Gram Stain NO WBC SEEN NO ORGANISMS SEEN   Final   Culture   Final    NO ANAEROBES ISOLATED Performed at [facility], [place] [address] [place], [place] [address]    Report Status 06/27/2020 FINAL  Final  Body fluid culture     Status: None   Collection Time: 06/22/20  8:56 AM   Specimen: Synovium  Result Value Ref Range Status   Specimen Description   Final    SYNOVIAL RT KNEE Performed at [facility] [address] [place], [place] [address]    Special Requests   Final    NONE Performed at [facility], [place] [address] [place], [place] [address]    Gram Stain NO WBC SEEN NO ORGANISMS SEEN   Final   Culture   Final    RARE ENTEROCOCCUS FAECALIS CRITICAL RESULT CALLED TO, READ BACK BY AND VERIFIED WITH: RN F.KUSITZ AT 2122 ON 06/23/2020 BY T.SAAD Performed at [facility], [place] [address], [place], [place] [address]    Report Status 06/25/2020 FINAL  Final   Organism ID, Bacteria ENTEROCOCCUS FAECALIS  Final      Susceptibility   Enterococcus faecalis -  MIC*    AMPICILLIN <=2 SENSITIVE Sensitive     VANCOMYCIN 1 SENSITIVE Sensitive     GENTAMICIN SYNERGY SENSITIVE Sensitive     * RARE ENTEROCOCCUS FAECALIS     All pertinent labs/Imagings/notes reviewed. All pertinent plain films and CT images have been personally  visualized and interpreted; radiology reports have been reviewed. Decision making incorporated into the Impression / Recommendations.  I spent greater than 25  minutes with the patient including greater than 50% of time in face to face counsel of the patient and in coordination of their care.

## 2020-09-15 NOTE — Assessment & Plan Note (Signed)
Likely drug related  Will check with Rheumatologist to see if Methotrexate dose can be lowered

## 2020-09-15 NOTE — Patient Instructions (Addendum)
Follow up with your rheumatologist to see if can go down on Methotrexate dose due to DDI with Augmentin  Continue taking Augmentin as is Will follow up your labs today  Follow up with Ortho  See you in 1 month

## 2020-09-15 NOTE — Assessment & Plan Note (Signed)
Continue Augmentin, meds refilled Will need PO abx for approx 6 months  Fu with Ortho

## 2020-09-15 NOTE — Assessment & Plan Note (Signed)
Declined flu and COVID vaccines

## 2020-09-16 ENCOUNTER — Telehealth: Payer: Self-pay

## 2020-09-16 LAB — COMPREHENSIVE METABOLIC PANEL
AG Ratio: 1.2 (calc) (ref 1.0–2.5)
ALT: 49 U/L — ABNORMAL HIGH (ref 9–46)
AST: 43 U/L — ABNORMAL HIGH (ref 10–35)
Albumin: 3.6 g/dL (ref 3.6–5.1)
Alkaline phosphatase (APISO): 76 U/L (ref 35–144)
BUN: 21 mg/dL (ref 7–25)
CO2: 23 mmol/L (ref 20–32)
Calcium: 9.1 mg/dL (ref 8.6–10.3)
Chloride: 109 mmol/L (ref 98–110)
Creat: 0.95 mg/dL (ref 0.70–1.33)
Globulin: 3 g/dL (calc) (ref 1.9–3.7)
Glucose, Bld: 87 mg/dL (ref 65–99)
Potassium: 4.3 mmol/L (ref 3.5–5.3)
Sodium: 142 mmol/L (ref 135–146)
Total Bilirubin: 0.4 mg/dL (ref 0.2–1.2)
Total Protein: 6.6 g/dL (ref 6.1–8.1)

## 2020-09-16 LAB — CBC
HCT: 40.8 % (ref 38.5–50.0)
Hemoglobin: 12.9 g/dL — ABNORMAL LOW (ref 13.2–17.1)
MCH: 26.3 pg — ABNORMAL LOW (ref 27.0–33.0)
MCHC: 31.6 g/dL — ABNORMAL LOW (ref 32.0–36.0)
MCV: 83.3 fL (ref 80.0–100.0)
MPV: 9.4 fL (ref 7.5–12.5)
Platelets: 398 10*3/uL (ref 140–400)
RBC: 4.9 10*6/uL (ref 4.20–5.80)
RDW: 16.9 % — ABNORMAL HIGH (ref 11.0–15.0)
WBC: 10.3 10*3/uL (ref 3.8–10.8)

## 2020-09-16 LAB — C-REACTIVE PROTEIN: CRP: 24.8 mg/L — ABNORMAL HIGH (ref <8.0)

## 2020-09-16 LAB — SEDIMENTATION RATE: Sed Rate: 34 mm/h — ABNORMAL HIGH (ref 0–20)

## 2020-09-16 NOTE — Telephone Encounter (Signed)
Most recent lab results faxed to Dr. Deri Fuelling office per Dr. Elinor Parkinson.  Oluwatobi Visser Loyola Mast, RN

## 2020-09-21 ENCOUNTER — Telehealth: Payer: Self-pay

## 2020-09-21 ENCOUNTER — Other Ambulatory Visit: Payer: Self-pay

## 2020-09-21 DIAGNOSIS — T8453XD Infection and inflammatory reaction due to internal right knee prosthesis, subsequent encounter: Secondary | ICD-10-CM

## 2020-09-21 NOTE — Telephone Encounter (Signed)
Spoke with patient's wife and she states that the patient's rheumatologist decreased his methotrexate last week from 8 tabs to 4 tabs daily and is asking if that could be why his CRP is higher. She also stated the patient is scheduled to see the orthopedic surgeon on 10/04/20. Patient's wife is asking should labs be repeated before his appointment with ortho.  [name]

## 2020-09-21 NOTE — Telephone Encounter (Signed)
-----   Message from Odette Fraction, MD sent at 09/21/2020  2:51 PM EST ----- Regarding: Please fax Could you please fax my office notes from 12/2 to Dr Alusio's office ( [facility])?

## 2020-09-21 NOTE — Telephone Encounter (Signed)
I don't think so there is relationship of increased inflammatory markers and decreasing his methotrexate as the labs were drawn before methotrexate dose was changed.  I am ok with getting a repeat ESR and CRP before the Ortho visit

## 2020-09-21 NOTE — Telephone Encounter (Signed)
Office notes faxed to Dr. Tana Felts office as requested.  [name]

## 2020-09-21 NOTE — Telephone Encounter (Signed)
Pt informed and scheduled for labs on 09/26/20

## 2020-09-21 NOTE — Telephone Encounter (Signed)
-----   Message from Odette Fraction, MD sent at 09/21/2020  2:45 PM EST ----- Regarding: Labs Can you please check with the patient if he has already seen his Orthpedic Surgeon? His CRP from last visit was actually high than his prior values for which I am little concerned. I have faxed those reports to Dr Alusio's office.

## 2020-09-26 ENCOUNTER — Other Ambulatory Visit: Payer: 59

## 2020-09-26 ENCOUNTER — Other Ambulatory Visit: Payer: Self-pay

## 2020-09-26 DIAGNOSIS — T8453XD Infection and inflammatory reaction due to internal right knee prosthesis, subsequent encounter: Secondary | ICD-10-CM

## 2020-09-27 LAB — SEDIMENTATION RATE: Sed Rate: 39 mm/h — ABNORMAL HIGH (ref 0–20)

## 2020-09-27 LAB — C-REACTIVE PROTEIN: CRP: 29.9 mg/L — ABNORMAL HIGH (ref <8.0)

## 2020-09-29 ENCOUNTER — Ambulatory Visit: Payer: 59 | Admitting: Infectious Diseases

## 2020-10-18 ENCOUNTER — Ambulatory Visit (INDEPENDENT_AMBULATORY_CARE_PROVIDER_SITE_OTHER): Payer: 59 | Admitting: Infectious Diseases

## 2020-10-18 ENCOUNTER — Other Ambulatory Visit: Payer: Self-pay

## 2020-10-18 ENCOUNTER — Encounter: Payer: Self-pay | Admitting: Infectious Diseases

## 2020-10-18 VITALS — BP 133/83 | HR 103 | Temp 98.0°F | Ht 68.0 in | Wt 315.0 lb

## 2020-10-18 DIAGNOSIS — M069 Rheumatoid arthritis, unspecified: Secondary | ICD-10-CM | POA: Diagnosis not present

## 2020-10-18 DIAGNOSIS — R7401 Elevation of levels of liver transaminase levels: Secondary | ICD-10-CM

## 2020-10-18 DIAGNOSIS — T8453XD Infection and inflammatory reaction due to internal right knee prosthesis, subsequent encounter: Secondary | ICD-10-CM

## 2020-10-18 DIAGNOSIS — Z79899 Other long term (current) drug therapy: Secondary | ICD-10-CM

## 2020-10-18 DIAGNOSIS — Z5181 Encounter for therapeutic drug level monitoring: Secondary | ICD-10-CM

## 2020-10-18 DIAGNOSIS — T8450XD Infection and inflammatory reaction due to unspecified internal joint prosthesis, subsequent encounter: Secondary | ICD-10-CM

## 2020-10-18 MED ORDER — AMOXICILLIN-POT CLAVULANATE 875-125 MG PO TABS
1.0000 | ORAL_TABLET | Freq: Two times a day (BID) | ORAL | 0 refills | Status: DC
Start: 2020-10-18 — End: 2020-11-16

## 2020-10-18 NOTE — Progress Notes (Addendum)
[facility]                                                             [place], [place], [place], [address]                                                                  Phn. [phone]; Fax: [phone]                                                                             Date: 10/18/2020  Reason for Follow Up: PJI of knee  Assessment #Recurrent Rt knee PJI CRP 24.8>29.9, Uptrending  ESR 34>39, Uptrending  #Rheumatoid Arthritis on Methotrexate Methotrexate has been decreased from $RemoveBefore'20mg'LhrSZYtwPfUYc$  PO q weekly to $RemoveB'10mg'VoDpKBYd$  PO wekly in the setting of transaminitis  #Transaminitis 08/05/20 AST 101 and ALT 108, in the setting of Methotrexate and Augmentin. Methotrexate has been reduced from $RemoveBefo'20mg'tJPJNwPMzMK$  po weekly to $RemoveB'10mg'fkwkBdAB$  po weekly for last 3 weeks or so.   Plan Patient has completed 6 weeks of IV Zosyn on 10/22 after which patient was started on Augmentin 875/$RemoveBeforeDE'125mg'DJUBPAepGNNnkgK$  PO BID. Plan to continue Augmetin for approx 6 months.  CBC, CMP, ESR and CRP today  Follow up with Ortho for timing of re-implantation.  Follow up with me in 1 month  All questions and concerns were discussed and addressed. Patient verbalized understanding of the plan. ____________________________________________________________________________________________________________________ Subjective/Internal events 10/18/20 Patient is here with his wife. He has been taking Augmentin twice a day without any issues or side effects. Denies any pain at the rt knee currently but the pain is variable and most of the time he has no pain unless he stands up and tries to move around. He has minimal clear drainage from the anterior portion of the rt knee. He says he saw orthopedic surgeon Dr Sherol Dade in end of December and had fluid from the joint aspirated ( he does not think cultures were done). He says the surgeon is holding off on the joint  re-implantation given rising inflammatory markers.  He is also worried about being  laid off from his job as he is approaching almost 6 months since being out from work and will not have his insurance coverage thereafter.   ROS: 11 point ROS with pertinent positives and negatives listed above. Otherwise,   Past Medical History:  Diagnosis Date  . Anemia    hx of  . Asthma   . Cellulitis of lower extremity    "usually RLL; this time it's got up to my lower abdoment" (02/11/2014)-series of antibiotics completed 02-28-14  . Diverticulosis   . Edema    legs  . Hypertension   . Infection of right knee ([place])   . OSA (obstructive sleep apnea)  does not use cpap   . PONV (postoperative nausea and vomiting)   . Rheumatoid arthritis ([place]) dx'd ~ 1977  . S/P PICC central line placement 03-16-14   right upper arm remains intact.05-11-14 removed 1 week ago.   Past Surgical History:  Procedure Laterality Date  . EXCISIONAL HEMORRHOIDECTOMY    . EXCISIONAL TOTAL KNEE ARTHROPLASTY WITH ANTIBIOTIC SPACERS Right 03/18/2014   Procedure: RIGHT KNEE RESECTION ARTHROPLASTY WITH ANTIBIOTIC SPACERS;  Surgeon: [name], MD;  Location: [facility];  Service: Orthopedics;  Laterality: Right;  . EXCISIONAL TOTAL KNEE ARTHROPLASTY WITH ANTIBIOTIC SPACERS Right 06/22/2020   Procedure: Right knee resection arthroplasty, antibiotic spacer;  Surgeon: Gaynelle Arabian, MD;  Location: [facility];  Service: Orthopedics;  Laterality: Right;  154min  . FOREIGN BODY REMOVAL Left    "BB" removal above left eye-teen yrs.  Marland Kitchen HERNIA REPAIR    . I & D KNEE WITH POLY EXCHANGE Right 04/25/2016   Procedure: RIGHT KNEE IRRIGATION AND DEBRIDEMENT WITH POLY EXCHANGE;  Surgeon: Gaynelle Arabian, MD;  Location: [facility];  Service: Orthopedics;  Laterality: Right;  . I & D KNEE WITH POLY EXCHANGE Right 10/01/2016   Procedure: IRRIGATION AND DEBRIDEMENT KNEE WITH POLY EXCHANGE;  Surgeon: Gaynelle Arabian, MD;  Location: [facility];  Service: Orthopedics;   Laterality: Right;  . I & D KNEE WITH POLY EXCHANGE Right 08/26/2017   Procedure: IRRIGATION AND DEBRIDEMENT KNEE WITH POLY EXCHANGE;  Surgeon: Gaynelle Arabian, MD;  Location: [facility];  Service: Orthopedics;  Laterality: Right;  . I & D KNEE WITH POLY EXCHANGE Right 11/16/2019   Procedure: IRRIGATION AND DEBRIDEMENT KNEE WITH POLY EXCHANGE;  Surgeon: Gaynelle Arabian, MD;  Location: [facility];  Service: Orthopedics;  Laterality: Right;  93min  . I & D KNEE WITH POLY EXCHANGE Right 02/22/2020   Procedure: Right knee irrigation and debridement;  Surgeon: Gaynelle Arabian, MD;  Location: [facility];  Service: Orthopedics;  Laterality: Right;  42min  . INCISION AND DRAINAGE Right 09/28/2014   Procedure: INCISION AND DRAINAGE RIGHT KNEE;  Surgeon: [name], MD;  Location: [facility];  Service: Orthopedics;  Laterality: Right;  . KNEE BURSECTOMY Right 03/13/2017   Procedure: RIGHT KNEE PREPATELLAR BURSECTOMY;  Surgeon: Gaynelle Arabian, MD;  Location: [facility];  Service: Orthopedics;  Laterality: Right;  . picc line placement Right    right upper arm  . 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 REVISION TOTAL KNEE ARTHROPLASTY Right 2012  . SPIGELIAN HERNIA Right 12/04/2015   Procedure: INCARCERATED SPIGELIAN HERNIA REPAIR WITH MESH;  Surgeon: Georganna Skeans, MD;  Location: [facility];  Service: General;  Laterality: Right;  . TOTAL KNEE ARTHROPLASTY Right 05/21/2014   Procedure: RIGHT KNEE ARTHROPLASTY REINPLANTATION;  Surgeon: [name], MD;  Location: [facility];  Service: Orthopedics;  Laterality: Right;   Current Outpatient Medications on File Prior to Visit  Medication Sig Dispense Refill  . diltiazem (CARDIZEM CD) 240 MG 24 hr capsule Take 240 mg by mouth daily.     . diphenoxylate-atropine (LOMOTIL) 2.5-0.025 MG/5ML liquid Take 5 mLs by mouth 4 (four) times daily as needed for diarrhea  or loose stools. 60 mL 4  . Fluticasone-Salmeterol (ADVAIR) 250-50 MCG/DOSE AEPB Inhale 1 puff 2 (two) times daily as needed into the lungs (for shortness of breath or wheezing).    . folic acid (FOLVITE) 1 MG tablet Take 1  mg by mouth daily.    Marland Kitchen HYDROMET 5-1.5 MG/5ML syrup Take by mouth.    . Ipratropium-Albuterol (COMBIVENT RESPIMAT) 20-100 MCG/ACT AERS respimat Inhale 1 puff into the lungs every 6 (six) hours as needed for wheezing or shortness of breath.    . losartan (COZAAR) 100 MG tablet Take 100 mg by mouth daily.     . methocarbamol (ROBAXIN) 500 MG tablet Take 1 tablet (500 mg total) by mouth every 6 (six) hours as needed for muscle spasms. 40 tablet 0  . methotrexate (RHEUMATREX) 2.5 MG tablet Take 20 mg by mouth every Monday. Caution:Chemotherapy. Protect from light.    . metoprolol succinate (TOPROL-XL) 100 MG 24 hr tablet Take 100 mg by mouth daily. Take with or immediately following a meal.    . nystatin (MYCOSTATIN/NYSTOP) powder Apply 1 application topically 3 (three) times daily. 30 g 5  . oxyCODONE (OXY IR/ROXICODONE) 5 MG immediate release tablet Take 1-2 tablets (5-10 mg total) by mouth every 6 (six) hours as needed for severe pain. 42 tablet 0  . predniSONE (DELTASONE) 20 MG tablet Take by mouth.    . RABEprazole (ACIPHEX) 20 MG tablet Take 20 mg by mouth daily before breakfast.     . traMADol (ULTRAM) 50 MG tablet Take 1-2 tablets (50-100 mg total) by mouth every 6 (six) hours as needed for moderate pain. 40 tablet 0  . triamterene-hydrochlorothiazide (MAXZIDE-25) 37.5-25 MG tablet Take 1 tablet by mouth daily.     No current facility-administered medications on file prior to visit.   Allergies  Allergen Reactions  . Avelox [Moxifloxacin Hcl In Nacl] Hives, Shortness Of Breath and Itching    Tolerates Cipro  . Doxycycline Itching  . Rocephin [Ceftriaxone Sodium In Dextrose] Hives and Itching  . Sulfa Antibiotics Itching  . Vancomycin Hives and Itching  . Erythrocin  Rash  . Penicillins Rash    Tolerated augmentin   . Sulfasalazine Rash    Vitals BP 133/83   Pulse (!) 103   Temp 98 F (36.7 C)   Ht $R'5\' 8"'Ou$  (1.727 m)   Wt (!) 315 lb (142.9 kg)   BMI 47.90 kg/m   General - not in acute distress, comfortably sitting in wheelchair, OBESE  HEENT - PEERLA, no pallor and no icterus Chest - b/l clear air entry, no additional sounds CVS- Normal s1s2, RRR Abdomen - Soft, Non tender , non distended Ext-  Rt knee   Neuro: grossly normal Back - WNL Psych : calm and cooperative Skin- no obvious rashes, lesions.  Recent labs  CBC Latest Ref Rng & Units 09/15/2020 06/23/2020 06/15/2020  WBC 3.8 - 10.8 Thousand/uL 10.3 12.3(H) 6.7  Hemoglobin 13.2 - 17.1 g/dL 12.9(L) 11.1(L) 11.6(L)  Hematocrit 38.5 - 50.0 % 40.8 36.3(L) 37.7(L)  Platelets 140 - 400 Thousand/uL 398 301 273    CMP Latest Ref Rng & Units 09/15/2020 06/23/2020 06/15/2020  Glucose 65 - 99 mg/dL 87 152(H) 92  BUN 7 - 25 mg/dL 21 18 21(H)  Creatinine 0.70 - 1.33 mg/dL 0.95 1.03 0.89  Sodium 135 - 146 mmol/L 142 136 141  Potassium 3.5 - 5.3 mmol/L 4.3 4.3 4.2  Chloride 98 - 110 mmol/L 109 105 110  CO2 20 - 32 mmol/L $RemoveB'23 25 25  'JiUtUQdk$ Calcium 8.6 - 10.3 mg/dL 9.1 8.1(L) 8.3(L)  Total Protein 6.1 - 8.1 g/dL 6.6 - 6.7  Total Bilirubin 0.2 - 1.2 mg/dL 0.4 - 0.4  Alkaline Phos 38 - 126 U/L - - 52  AST 10 -  35 U/L 43(H) - 20  ALT 9 - 46 U/L 49(H) - 16    Pertinent Microbiology Results for orders placed or performed during the hospital encounter of 06/22/20  Aerobic/Anaerobic Culture (surgical/deep wound)     Status: None   Collection Time: 06/22/20  8:56 AM   Specimen: Joint, Other; Body Fluid  Result Value Ref Range Status   Specimen Description   Final    TISSUE RT KNEE Performed at [facility], [address], [place], [place] [address]    Special Requests   Final    NONE Performed at [facility], [address] [place], [place] [address]    Gram  Stain NO WBC SEEN NO ORGANISMS SEEN   Final   Culture   Final    RARE ENTEROCOCCUS FAECALIS NO ANAEROBES ISOLATED Performed at [facility], [address] [place], [place] [address]    Report Status 06/27/2020 FINAL  Final   Organism ID, Bacteria ENTEROCOCCUS FAECALIS  Final      Susceptibility   Enterococcus faecalis - MIC*    AMPICILLIN <=2 SENSITIVE Sensitive     VANCOMYCIN <=0.5 SENSITIVE Sensitive     GENTAMICIN SYNERGY SENSITIVE Sensitive     * RARE ENTEROCOCCUS FAECALIS  Anaerobic culture     Status: None   Collection Time: 06/22/20  8:56 AM   Specimen: Synovium  Result Value Ref Range Status   Specimen Description   Final    SYNOVIAL RT KNEE Performed at [facility], [address] [place], [place] [address]    Special Requests   Final    NONE Performed at [facility], [address], [place], [place] [address]    Gram Stain NO WBC SEEN NO ORGANISMS SEEN   Final   Culture   Final    NO ANAEROBES ISOLATED Performed at [facility], [address] [place], [place] [address]    Report Status 06/27/2020 FINAL  Final  Body fluid culture     Status: None   Collection Time: 06/22/20  8:56 AM   Specimen: Synovium  Result Value Ref Range Status   Specimen Description   Final    SYNOVIAL RT KNEE Performed at [facility], [address], [place], [place] [address]    Special Requests   Final    NONE Performed at [facility], [address] [place], [place] [address]    Gram Stain NO WBC SEEN NO ORGANISMS SEEN   Final   Culture   Final    RARE ENTEROCOCCUS FAECALIS CRITICAL RESULT CALLED TO, READ BACK BY AND VERIFIED WITH: RN F.KUSITZ AT 1227 ON 06/23/2020 BY T.SAAD Performed at [facility], [address] [place], [place] [address]    Report Status 06/25/2020 FINAL  Final   Organism ID, Bacteria ENTEROCOCCUS FAECALIS  Final      Susceptibility   Enterococcus  faecalis - MIC*    AMPICILLIN <=2 SENSITIVE Sensitive     VANCOMYCIN 1 SENSITIVE Sensitive     GENTAMICIN SYNERGY SENSITIVE Sensitive     * RARE ENTEROCOCCUS FAECALIS     All pertinent labs/Imagings/notes reviewed. All pertinent plain films and CT images have been personally visualized and interpreted; radiology reports have been reviewed. Decision making incorporated into the Impression / Recommendations.  I spent greater than 25 minutes with the patient including  review of prior medical records with greater than 50% of time in  face to face counsel of the patient.    Electronically signed by:  Rosiland Oz, MD Infectious Disease Physician [facility] [address] [place], [place] [address] Phone: [phone]  Fax: [phone]

## 2020-10-18 NOTE — Assessment & Plan Note (Signed)
Continue Augmentin ESR and CRP today FU with Ortho FU with me in 1 month

## 2020-10-18 NOTE — Assessment & Plan Note (Signed)
CBC and CMP today

## 2020-10-19 LAB — COMPREHENSIVE METABOLIC PANEL
AG Ratio: 1.2 (calc) (ref 1.0–2.5)
ALT: 29 U/L (ref 9–46)
AST: 30 U/L (ref 10–35)
Albumin: 3.6 g/dL (ref 3.6–5.1)
Alkaline phosphatase (APISO): 79 U/L (ref 35–144)
BUN: 14 mg/dL (ref 7–25)
CO2: 27 mmol/L (ref 20–32)
Calcium: 8.9 mg/dL (ref 8.6–10.3)
Chloride: 106 mmol/L (ref 98–110)
Creat: 0.97 mg/dL (ref 0.70–1.33)
Globulin: 3.1 g/dL (calc) (ref 1.9–3.7)
Glucose, Bld: 91 mg/dL (ref 65–99)
Potassium: 4.5 mmol/L (ref 3.5–5.3)
Sodium: 140 mmol/L (ref 135–146)
Total Bilirubin: 0.5 mg/dL (ref 0.2–1.2)
Total Protein: 6.7 g/dL (ref 6.1–8.1)

## 2020-10-19 LAB — C-REACTIVE PROTEIN: CRP: 45.8 mg/L — ABNORMAL HIGH (ref <8.0)

## 2020-10-19 LAB — CBC
HCT: 41 % (ref 38.5–50.0)
Hemoglobin: 13.3 g/dL (ref 13.2–17.1)
MCH: 27.3 pg (ref 27.0–33.0)
MCHC: 32.4 g/dL (ref 32.0–36.0)
MCV: 84.2 fL (ref 80.0–100.0)
MPV: 9.6 fL (ref 7.5–12.5)
Platelets: 365 10*3/uL (ref 140–400)
RBC: 4.87 10*6/uL (ref 4.20–5.80)
RDW: 16.5 % — ABNORMAL HIGH (ref 11.0–15.0)
WBC: 9.8 10*3/uL (ref 3.8–10.8)

## 2020-10-19 LAB — SEDIMENTATION RATE: Sed Rate: 41 mm/h — ABNORMAL HIGH (ref 0–20)

## 2020-11-09 ENCOUNTER — Encounter: Payer: Self-pay | Admitting: Gastroenterology

## 2020-11-16 ENCOUNTER — Other Ambulatory Visit: Payer: Self-pay

## 2020-11-16 ENCOUNTER — Ambulatory Visit (INDEPENDENT_AMBULATORY_CARE_PROVIDER_SITE_OTHER): Payer: 59 | Admitting: Infectious Diseases

## 2020-11-16 ENCOUNTER — Encounter: Payer: Self-pay | Admitting: Infectious Diseases

## 2020-11-16 VITALS — BP 118/71 | HR 67 | Temp 97.5°F | Ht 68.0 in

## 2020-11-16 DIAGNOSIS — M069 Rheumatoid arthritis, unspecified: Secondary | ICD-10-CM | POA: Diagnosis not present

## 2020-11-16 DIAGNOSIS — R7401 Elevation of levels of liver transaminase levels: Secondary | ICD-10-CM

## 2020-11-16 DIAGNOSIS — T8450XD Infection and inflammatory reaction due to unspecified internal joint prosthesis, subsequent encounter: Secondary | ICD-10-CM

## 2020-11-16 DIAGNOSIS — T8453XA Infection and inflammatory reaction due to internal right knee prosthesis, initial encounter: Secondary | ICD-10-CM

## 2020-11-16 NOTE — Progress Notes (Addendum)
[facility]                                                             [place], [place], [place], [address]                                                                  Phn. 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 However, his recent CRP is high in comparison to his CRP values from September - December/2021  #Rheumatoid Arthritis on Methotrexate Follows up with Rheumatology   #Transaminitis, resolved    Plan -Patient has completed 6 weeks of IV Zosyn on 10/22 after which patient was started on Augmentin 875/$RemoveBeforeDE'125mg'[id number]$  PO BID since then.  -Will stop Augmentin today and monitor off antibiotics. If he does OK without any signs/symptoms concerning for recurrence of infection in his rt knee off antibiotics along with downtrending inflammatory markers especially CRP, I hope he will be able to go ahead with the re-implantation in the next few weeks as deemed appropriate from ortho standpoint. I would recommend at least 2 weeks of antibiotic free period before re-implantation and send path/tissue cultures at the time of reimplantation  -CBC, CMP, ESR and CRP today  -CRP and ESR in 2 weeks  -Follow up with Ortho for timing of re-implantation.  -Follow up with me in 1 month  All questions and concerns were discussed and addressed. Patient verbalized understanding of the plan. ____________________________________________________________________________________________________________________ Subjective/Internal events  10/18/20 Patient is here with his wife. He has been taking Augmentin twice a day without any issues  or side effects. Denies any pain at the rt knee currently but the pain is variable and most of the time he has no pain unless he stands up and tries to move around. He has minimal clear drainage from the anterior portion of the rt knee. He says he saw orthopedic surgeon Dr Sherol Dade in end of December and had fluid from the joint aspirated ( he does not think cultures were done). He says the surgeon is holding off on the joint re-implantation given rising inflammatory markers.  He is also worried about being  laid off from his job as he is approaching almost 6 months since being out from work and will not have his insurance coverage thereafter.   11/16/20 Patient is here with his wife. Taking Augmentin twice a  day as before. No new complaints since last visit. Denies any pain/tenderness and swelling at the Rt knee. Denies any drainage. Denies any fevers, chills and sweats. Denies N/V/D/rashes and allergy. He is following up with Dr Maureen Ralphs for timing of re-implantation.  They are very eager to have the joint re-implanted as soon as possible because of the fear of running out of his insurance. I have also staff messaged Dr Maureen Ralphs regarding stopping oral antibiotics today and monitor him off antibiotics with plans for re-implantation.   ROS: 11 point ROS with pertinent positives and negatives listed above. Otherwise,   Past Medical History:  Diagnosis Date  . Anemia    hx of  . Asthma   . Cellulitis of lower extremity    "usually RLL; this time it's got up to my lower abdoment" (02/11/2014)-series of antibiotics completed 02-28-14  . Diverticulosis   . Edema    legs  . Hypertension   . Infection of right knee ([place])   . OSA (obstructive sleep apnea)    does not use cpap   . PONV (postoperative nausea and vomiting)   . Rheumatoid arthritis ([place]) dx'd ~ 1977  . S/P PICC central line placement 03-16-14   right upper arm remains intact.05-11-14 removed 1 week ago.   Past Surgical History:  Procedure  Laterality Date  . EXCISIONAL HEMORRHOIDECTOMY    . EXCISIONAL TOTAL KNEE ARTHROPLASTY WITH ANTIBIOTIC SPACERS Right 03/18/2014   Procedure: RIGHT KNEE RESECTION ARTHROPLASTY WITH ANTIBIOTIC SPACERS;  Surgeon: [name], MD;  Location: [facility];  Service: Orthopedics;  Laterality: Right;  . EXCISIONAL TOTAL KNEE ARTHROPLASTY WITH ANTIBIOTIC SPACERS Right 06/22/2020   Procedure: Right knee resection arthroplasty, antibiotic spacer;  Surgeon: Gaynelle Arabian, MD;  Location: [facility];  Service: Orthopedics;  Laterality: Right;  127min  . FOREIGN BODY REMOVAL Left    "BB" removal above left eye-teen yrs.  Marland Kitchen HERNIA REPAIR    . I & D KNEE WITH POLY EXCHANGE Right 04/25/2016   Procedure: RIGHT KNEE IRRIGATION AND DEBRIDEMENT WITH POLY EXCHANGE;  Surgeon: Gaynelle Arabian, MD;  Location: [facility];  Service: Orthopedics;  Laterality: Right;  . I & D KNEE WITH POLY EXCHANGE Right 10/01/2016   Procedure: IRRIGATION AND DEBRIDEMENT KNEE WITH POLY EXCHANGE;  Surgeon: Gaynelle Arabian, MD;  Location: [facility];  Service: Orthopedics;  Laterality: Right;  . I & D KNEE WITH POLY EXCHANGE Right 08/26/2017   Procedure: IRRIGATION AND DEBRIDEMENT KNEE WITH POLY EXCHANGE;  Surgeon: Gaynelle Arabian, MD;  Location: [facility];  Service: Orthopedics;  Laterality: Right;  . I & D KNEE WITH POLY EXCHANGE Right 11/16/2019   Procedure: IRRIGATION AND DEBRIDEMENT KNEE WITH POLY EXCHANGE;  Surgeon: Gaynelle Arabian, MD;  Location: [facility];  Service: Orthopedics;  Laterality: Right;  60min  . I & D KNEE WITH POLY EXCHANGE Right 02/22/2020   Procedure: Right knee irrigation and debridement;  Surgeon: Gaynelle Arabian, MD;  Location: [facility];  Service: Orthopedics;  Laterality: Right;  6min  . INCISION AND DRAINAGE Right 09/28/2014   Procedure: INCISION AND DRAINAGE RIGHT KNEE;  Surgeon: [name], MD;  Location: [facility];  Service: Orthopedics;  Laterality: Right;  . KNEE BURSECTOMY Right 03/13/2017   Procedure: RIGHT KNEE PREPATELLAR BURSECTOMY;   Surgeon: Gaynelle Arabian, MD;  Location: [facility];  Service: Orthopedics;  Laterality: Right;  . picc line placement Right    right upper arm  . 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 REVISION TOTAL KNEE ARTHROPLASTY Right 2012  . SPIGELIAN HERNIA Right 12/04/2015   Procedure: INCARCERATED SPIGELIAN HERNIA REPAIR WITH MESH;  Surgeon: Georganna Skeans, MD;  Location: [facility];  Service: General;  Laterality: Right;  . TOTAL KNEE ARTHROPLASTY Right 05/21/2014   Procedure: RIGHT KNEE ARTHROPLASTY REINPLANTATION;  Surgeon: [name], MD;  Location: [facility];  Service: Orthopedics;  Laterality: Right;   Current Outpatient Medications on File Prior to Visit  Medication Sig Dispense Refill  . diltiazem (CARDIZEM CD) 240 MG 24 hr capsule Take 240 mg by mouth daily.     . diphenoxylate-atropine (LOMOTIL) 2.5-0.025 MG/5ML liquid Take 5 mLs by mouth 4 (four) times daily as needed for diarrhea or loose stools. 60 mL 4  . Fluticasone-Salmeterol (ADVAIR) 250-50 MCG/DOSE AEPB Inhale 1 puff 2 (two) times daily as needed into the lungs (for shortness of breath or wheezing).    . folic acid (FOLVITE) 1 MG tablet Take 1 mg by mouth daily.    . Ipratropium-Albuterol (COMBIVENT RESPIMAT) 20-100 MCG/ACT AERS respimat Inhale 1 puff into the lungs every 6 (six) hours as needed for wheezing or shortness of breath.    . losartan (COZAAR) 100 MG tablet Take 100 mg by mouth daily.     . methocarbamol (ROBAXIN) 500 MG tablet Take 1 tablet (500 mg total) by mouth every 6 (six) hours as needed for muscle spasms. 40 tablet 0  . methotrexate (RHEUMATREX) 2.5 MG tablet Take 20 mg by mouth every Monday. Caution:Chemotherapy. Protect from light.    . metoprolol succinate (TOPROL-XL) 100 MG 24 hr tablet Take 100 mg by mouth daily. Take with or immediately following a meal.    . nystatin (MYCOSTATIN/NYSTOP)  powder Apply 1 application topically 3 (three) times daily. 30 g 5  . oxyCODONE (OXY IR/ROXICODONE) 5 MG immediate release tablet Take 1-2 tablets (5-10 mg total) by mouth every 6 (six) hours as needed for severe pain. 42 tablet 0  . predniSONE (DELTASONE) 20 MG tablet Take by mouth.    . RABEprazole (ACIPHEX) 20 MG tablet Take 20 mg by mouth daily before breakfast.     . Semaglutide (RYBELSUS) 3 MG TABS Take by mouth daily.    . traMADol (ULTRAM) 50 MG tablet Take 1-2 tablets (50-100 mg total) by mouth every 6 (six) hours as needed for moderate pain. 40 tablet 0  . triamterene-hydrochlorothiazide (MAXZIDE-25) 37.5-25 MG tablet Take 1 tablet by mouth daily.    . fluconazole (DIFLUCAN) 100 MG tablet Take 100 mg by mouth daily.    Marland Kitchen HYDROMET 5-1.5 MG/5ML syrup Take by mouth. (Patient not taking: Reported on 11/16/2020)     No current facility-administered medications on file prior to visit.     Allergies  Allergen Reactions  . Avelox [Moxifloxacin Hcl In Nacl] Hives, Shortness Of Breath and Itching    Tolerates Cipro  . Doxycycline Itching  . Rocephin [Ceftriaxone Sodium In Dextrose] Hives and Itching  . Sulfa Antibiotics Itching  . Vancomycin Hives and Itching  . Erythrocin Rash  . Penicillins Rash    Tolerated augmentin   . Sulfasalazine Rash   Social History   Socioeconomic History  . Marital status: Married    Spouse name: Not on file  . Number of children: 1  . Years of education: 66  . Highest education level: Not on file  Occupational History  . Not on file  Tobacco Use  . Smoking status: Former  Smoker    Packs/day: 1.00    Years: 3.00    Pack years: 3.00    Types: Cigarettes    Quit date: 03/16/1981    Years since quitting: 39.6  . Smokeless tobacco: Current User    Types: Snuff  . Tobacco comment: "quit smoking cigarettes in the 1980's"  Vaping Use  . Vaping Use: Never used  Substance and Sexual Activity  . Alcohol use: No  . Drug use: No  . Sexual activity:  Yes    Partners: Male  Other Topics Concern  . Not on file  Social History Narrative  . Not on file   Social Determinants of Health   Financial Resource Strain: Not on file  Food Insecurity: Not on file  Transportation Needs: Not on file  Physical Activity: Not on file  Stress: Not on file  Social Connections: Not on file  Intimate Partner Violence: Not on file     Vitals BP 118/71   Pulse 67   Temp (!) 97.5 F (36.4 C) (Oral)   Ht _0  (1.727 m)   SpO2 98%   BMI 47.90 kg/m   General - not in acute distress, comfortably sitting in wheelchair, OBESE  HEENT - PEERLA, no pallor and no icterus Chest - b/l clear air entry, no additional sounds CVS- Normal s1s2, RRR Abdomen - Soft, Non tender , non distended Ext-  Rt knee   Neuro: grossly normal Back - WNL Psych : calm and cooperative Skin- no obvious rashes, lesions.  Recent labs CBC Latest Ref Rng & Units 10/18/2020 09/15/2020 06/23/2020  WBC 3.8 - 10.8 Thousand/uL 9.8 10.3 12.3(H)  Hemoglobin 13.2 - 17.1 g/dL 13.3 12.9(L) 11.1(L)  Hematocrit 38.5 - 50.0 % 41.0 40.8 36.3(L)  Platelets 140 - 400 Thousand/uL 365 398 301   CMP Latest Ref Rng & Units 10/18/2020 09/15/2020 06/23/2020  Glucose 65 - 99 mg/dL 91 87 152(H)  BUN 7 - 25 mg/dL _1 Creatinine 0.70 - 1.33 mg/dL 0.97 0.95 1.03  Sodium 135 - 146 mmol/L 140 142 136  Potassium 3.5 - 5.3 mmol/L 4.5 4.3 4.3  Chloride 98 - 110 mmol/L 106 109 105  CO2 20 - 32 mmol/L _2 Calcium 8.6 - 10.3 mg/dL 8.9 9.1 8.1(L)  Total Protein 6.1 - 8.1 g/dL 6.7 6.6 -  Total Bilirubin 0.2 - 1.2 mg/dL 0.5 0.4 -  Alkaline Phos 38 - 126 U/L - - -  AST 10 - 35 U/L 30 43(H) -  ALT 9 - 46 U/L 29 49(H) -     Pertinent Microbiology Results for orders placed or performed during the hospital encounter of 06/22/20  Aerobic/Anaerobic Culture (surgical/deep wound)     Status: None   Collection Time: 06/22/20  8:56 AM   Specimen: Joint, Other; Body Fluid  Result Value Ref Range Status    Specimen Description   Final    TISSUE RT KNEE Performed at [facility] [address] [place], [place] [address]    Special Requests   Final    NONE Performed at [facility], [place] [address], [place], [place] [address]    Gram Stain NO WBC SEEN NO ORGANISMS SEEN   Final   Culture   Final    RARE ENTEROCOCCUS FAECALIS NO ANAEROBES ISOLATED Performed at [facility], [place] [address], [place], [place] [address]    Report Status 06/27/2020 FINAL  Final   Organism ID, Bacteria ENTEROCOCCUS FAECALIS  Final      Susceptibility   Enterococcus faecalis - MIC*    AMPICILLIN <=2 SENSITIVE Sensitive     VANCOMYCIN <=0.5 SENSITIVE Sensitive     GENTAMICIN SYNERGY SENSITIVE Sensitive     * RARE ENTEROCOCCUS FAECALIS  Anaerobic culture     Status: None   Collection Time: 06/22/20  8:56 AM   Specimen: Synovium  Result Value Ref Range Status   Specimen Description   Final    SYNOVIAL RT KNEE Performed at [facility], [place] [address], [place], [place] [address]    Special Requests   Final    NONE Performed at [facility], [place] [address] [place], [place] [address]    Gram Stain NO WBC SEEN NO ORGANISMS SEEN   Final   Culture   Final    NO ANAEROBES ISOLATED Performed at [facility], [place] [address] [place], [place] [address]    Report Status 06/27/2020 FINAL  Final  Body fluid culture     Status: None   Collection Time: 06/22/20  8:56 AM   Specimen: Synovium  Result Value Ref Range Status   Specimen Description   Final    SYNOVIAL RT KNEE Performed at [facility] [address], [place], [place] [address]    Special Requests   Final    NONE Performed at [facility], [place] [address], [place], [place] [address]    Gram Stain NO WBC SEEN NO ORGANISMS SEEN   Final   Culture   Final    RARE ENTEROCOCCUS FAECALIS CRITICAL RESULT CALLED TO, READ  BACK BY AND VERIFIED WITH: RN F.KUSITZ AT 0301 ON 06/23/2020 BY T.SAAD Performed at [facility], [place] [address], [place], [place] [address]    Report Status 06/25/2020 FINAL  Final   Organism ID, Bacteria ENTEROCOCCUS FAECALIS  Final      Susceptibility   Enterococcus faecalis - MIC*    AMPICILLIN <=2 SENSITIVE Sensitive     VANCOMYCIN 1 SENSITIVE Sensitive     GENTAMICIN SYNERGY SENSITIVE Sensitive     * RARE ENTEROCOCCUS FAECALIS    All pertinent labs/Imagings/notes reviewed. All pertinent plain films and CT images have been personally visualized and interpreted; radiology reports have been reviewed. Decision making incorporated into the Impression / Recommendations.  I spent greater than 25 minutes with the patient including  review of prior medical records with greater than 50% of time in face to face counsel of the patient.    Electronically signed by:  Rosiland Oz, MD Infectious Disease Physician [facility] [address] [place], [place] [address] Phone: [phone]  Fax: [phone]

## 2020-11-17 LAB — COMPREHENSIVE METABOLIC PANEL
AG Ratio: 1.3 (calc) (ref 1.0–2.5)
ALT: 27 U/L (ref 9–46)
AST: 23 U/L (ref 10–35)
Albumin: 3.6 g/dL (ref 3.6–5.1)
Alkaline phosphatase (APISO): 77 U/L (ref 35–144)
BUN: 18 mg/dL (ref 7–25)
CO2: 24 mmol/L (ref 20–32)
Calcium: 9 mg/dL (ref 8.6–10.3)
Chloride: 105 mmol/L (ref 98–110)
Creat: 0.87 mg/dL (ref 0.70–1.25)
Globulin: 2.8 g/dL (calc) (ref 1.9–3.7)
Glucose, Bld: 96 mg/dL (ref 65–99)
Potassium: 4.6 mmol/L (ref 3.5–5.3)
Sodium: 139 mmol/L (ref 135–146)
Total Bilirubin: 0.3 mg/dL (ref 0.2–1.2)
Total Protein: 6.4 g/dL (ref 6.1–8.1)

## 2020-11-17 LAB — CBC
HCT: 40.5 % (ref 38.5–50.0)
Hemoglobin: 13.2 g/dL (ref 13.2–17.1)
MCH: 27.4 pg (ref 27.0–33.0)
MCHC: 32.6 g/dL (ref 32.0–36.0)
MCV: 84.2 fL (ref 80.0–100.0)
MPV: 9.4 fL (ref 7.5–12.5)
Platelets: 332 10*3/uL (ref 140–400)
RBC: 4.81 10*6/uL (ref 4.20–5.80)
RDW: 15.7 % — ABNORMAL HIGH (ref 11.0–15.0)
WBC: 8.6 10*3/uL (ref 3.8–10.8)

## 2020-11-17 LAB — C-REACTIVE PROTEIN: CRP: 37.4 mg/L — ABNORMAL HIGH (ref <8.0)

## 2020-11-17 LAB — SEDIMENTATION RATE: Sed Rate: 31 mm/h — ABNORMAL HIGH (ref 0–20)

## 2020-11-29 ENCOUNTER — Other Ambulatory Visit: Payer: Self-pay

## 2020-11-29 ENCOUNTER — Other Ambulatory Visit: Payer: 59

## 2020-11-29 DIAGNOSIS — T8450XD Infection and inflammatory reaction due to unspecified internal joint prosthesis, subsequent encounter: Secondary | ICD-10-CM

## 2020-11-30 LAB — C-REACTIVE PROTEIN: CRP: 32.7 mg/L — ABNORMAL HIGH (ref <8.0)

## 2020-11-30 LAB — SEDIMENTATION RATE: Sed Rate: 25 mm/h — ABNORMAL HIGH (ref 0–20)

## 2020-12-02 ENCOUNTER — Telehealth: Payer: Self-pay | Admitting: *Deleted

## 2020-12-02 ENCOUNTER — Ambulatory Visit (AMBULATORY_SURGERY_CENTER): Payer: Self-pay | Admitting: *Deleted

## 2020-12-02 ENCOUNTER — Other Ambulatory Visit: Payer: Self-pay

## 2020-12-02 VITALS — Ht 68.0 in | Wt 323.0 lb

## 2020-12-02 DIAGNOSIS — Z01818 Encounter for other preprocedural examination: Secondary | ICD-10-CM

## 2020-12-02 DIAGNOSIS — Z1211 Encounter for screening for malignant neoplasm of colon: Secondary | ICD-10-CM

## 2020-12-02 MED ORDER — PLENVU 140 G PO SOLR: 1.0000 | ORAL | 0 refills | Status: DC

## 2020-12-02 NOTE — Telephone Encounter (Signed)
Thanks- will proceed as scheduled  

## 2020-12-02 NOTE — Telephone Encounter (Signed)
Dr Russella Dar,  Pt came into [facility] today in a wheelchair and right leg braced- he states he can ambulate with crutches- 5 months ago he had to have a knee replacement joint removed for infection-he has completed all antibiotics per pt and wife -  he is awaiting on Dr Despina Hick to schedule knee replacement surgery - his insurance runs out 12-20-2020 and he is trying to have his colon prior to losing insurance- screen colon- he said he will be able to dress and undress with his wife's assistance and his crutches- he cannot bend his right ;leg with or without his brace-   He may have to cancel IF Dr Despina Hick can do his knee surgery prior to him losing his insurance prior to 3-8, however, if he cannot, is he okay to proceed with his colon 12-16-2020 Friday as scheduled ?  Please advise-[facility]

## 2020-12-02 NOTE — Telephone Encounter (Signed)
OK to proceed with colonoscopy and wife can assist him in [facility].

## 2020-12-02 NOTE — Progress Notes (Addendum)
COVID Vaccine Completed:  No Date COVID Vaccine completed: Has received booster: COVID vaccine manufacturer: Pfizer    [employer]   Date of COVID positive in last 90 days:  N/A  PCP - Jarome Matin, MD Cardiologist - N/A  Chest x-ray - N/A EKG - 12-06-20 Epic Stress Test -  ECHO -  Cardiac Cath -  Pacemaker/ICD device last checked:  Sleep Study - 2014 Epic, +sleep apnea CPAP - No  Fasting Blood Sugar - N/A Checks Blood Sugar _____ times a day  Blood Thinner Instructions:  N/A Aspirin Instructions: Last Dose:  Activity level:  Unable to go up a flight of stairs without symptoms of knee pain. No symptoms of chest pain or shortness of breath.   Can perform ADLs independently.    Anesthesia review: N/A  Patient denies shortness of breath, fever, cough and chest pain at PAT appointment   Patient verbalized understanding of instructions that were given to them at the PAT appointment. Patient was also instructed that they will need to review over the PAT instructions again at home before surgery.

## 2020-12-02 NOTE — Patient Instructions (Addendum)
DUE TO COVID-19 ONLY ONE VISITOR IS ALLOWED TO COME WITH YOU AND STAY IN THE WAITING ROOM ONLY DURING PRE OP AND PROCEDURE.   IF YOU WILL BE ADMITTED INTO THE HOSPITAL YOU ARE ALLOWED ONE SUPPORT PERSON DURING VISITATION HOURS ONLY (10AM -8PM)   . The support person may change daily. . The support person must pass our screening, gel in and out, and wear a mask at all times, including in the patient's room. . Patients must also wear a mask when staff or their support person are in the room.   COVID SWAB TESTING MUST BE COMPLETED ON:  Tuesday, 12-06-20 @ 1:05 PM   [address] [place], [place] [address]  (Must self quarantine after testing. Follow instructions on handout.)         Your procedure is scheduled on:   Wednesday, 12-07-20   Report to [facility] Main  Entrance   Report to admitting at 2:40 PM   Call this number if you have problems the morning of surgery [phone]   Do not eat food :After Midnight.   May have liquids until 2:00 PM day of surgery  CLEAR LIQUID DIET  Foods Allowed                                                                     Foods Excluded  Water, Black Coffee and tea, regular and decaf           liquids that you cannot  Plain Jell-O in any flavor  (No red)                                  see through such as: Fruit ices (not with fruit pulp)                                      milk, soups, orange juice              Iced Popsicles (No red)                                      All solid food                                   Apple juices Sports drinks like Gatorade (No red) Lightly seasoned clear broth or consume(fat free) Sugar, honey syrup     Complete one Ensure drink the morning of surgery at  2:00 PM   the day of surgery.       1. The day of surgery:  ? Drink ONE (1) Pre-Surgery Clear Ensure or G2 by am the morning of surgery. Drink in one sitting. Do not sip.  ? This drink was given to you during your hospital  pre-op  appointment visit. ? Nothing else to drink after completing the  Pre-Surgery Clear Ensure or G2.          If you have questions, please  contact your surgeon's office.     Oral Hygiene is also important to reduce your risk of infection.                                    Remember - BRUSH YOUR TEETH THE MORNING OF SURGERY WITH YOUR REGULAR TOOTHPASTE   Do NOT smoke after Midnight   Take these medicines the morning of surgery with A SIP OF WATER: Diltiazem, Metoprolol, Aciphex   Do not take Semaglutide the morning of surgery                            You may not have any metal on your body including  jewelry, and body piercings             Do not wear lotions, powders, perfumes/cologne, or deodorant             Men may shave face and neck.   Do not bring valuables to the hospital. [facility] IS NOT             RESPONSIBLE   FOR VALUABLES.   Contacts, dentures or bridgework may not be worn into surgery.   Bring small overnight bag day of surgery.                Please read over the following fact sheets you were given: IF YOU HAVE QUESTIONS ABOUT YOUR PRE OP INSTRUCTIONS PLEASE CALL  [phone] [facility] - Preparing for Surgery Before surgery, you can play an important role.  Because skin is not sterile, your skin needs to be as free of germs as possible.  You can reduce the number of germs on your skin by washing with CHG (chlorahexidine gluconate) soap before surgery.  CHG is an antiseptic cleaner which kills germs and bonds with the skin to continue killing germs even after washing. Please DO NOT use if you have an allergy to CHG or antibacterial soaps.  If your skin becomes reddened/irritated stop using the CHG and inform your nurse when you arrive at Short Stay. Do not shave (including legs and underarms) for at least 48 hours prior to the first CHG shower.  You may shave your face/neck.  Please follow these instructions carefully:  1.  Shower with CHG Soap the  night before surgery and the  morning of surgery.  2.  If you choose to wash your hair, wash your hair first as usual with your normal  shampoo.  3.  After you shampoo, rinse your hair and body thoroughly to remove the shampoo.                             4.  Use CHG as you would any other liquid soap.  You can apply chg directly to the skin and wash.  Gently with a scrungie or clean washcloth.  5.  Apply the CHG Soap to your body ONLY FROM THE NECK DOWN.   Do   not use on face/ open                           Wound or open sores. Avoid contact with eyes, ears mouth and   genitals (private parts).  Wash face,  Genitals (private parts) with your normal soap.             6.  Wash thoroughly, paying special attention to the area where your    surgery  will be performed.  7.  Thoroughly rinse your body with warm water from the neck down.  8.  DO NOT shower/wash with your normal soap after using and rinsing off the CHG Soap.                9.  Pat yourself dry with a clean towel.            10.  Wear clean pajamas.            11.  Place clean sheets on your bed the night of your first shower and do not  sleep with pets. Day of Surgery : Do not apply any lotions/deodorants the morning of surgery.  Please wear clean clothes to the hospital/surgery center.  FAILURE TO FOLLOW THESE INSTRUCTIONS MAY RESULT IN THE CANCELLATION OF YOUR SURGERY  PATIENT SIGNATURE_________________________________  NURSE SIGNATURE__________________________________  ________________________________________________________________________     [name]  An incentive spirometer is a tool that can help keep your lungs clear and active. This tool measures how well you are filling your lungs with each breath. Taking long deep breaths may help reverse or decrease the chance of developing breathing (pulmonary) problems (especially infection) following:  A long period of time when you are unable  to move or be active. BEFORE THE PROCEDURE   If the spirometer includes an indicator to show your best effort, your nurse or respiratory therapist will set it to a desired goal.  If possible, sit up straight or lean slightly forward. Try not to slouch.  Hold the incentive spirometer in an upright position. INSTRUCTIONS FOR USE  1. Sit on the edge of your bed if possible, or sit up as far as you can in bed or on a chair. 2. Hold the incentive spirometer in an upright position. 3. Breathe out normally. 4. Place the mouthpiece in your mouth and seal your lips tightly around it. 5. Breathe in slowly and as deeply as possible, raising the piston or the ball toward the top of the column. 6. Hold your breath for 3-5 seconds or for as long as possible. Allow the piston or ball to fall to the bottom of the column. 7. Remove the mouthpiece from your mouth and breathe out normally. 8. Rest for a few seconds and repeat Steps 1 through 7 at least 10 times every 1-2 hours when you are awake. Take your time and take a few normal breaths between deep breaths. 9. The spirometer may include an indicator to show your best effort. Use the indicator as a goal to work toward during each repetition. 10. After each set of 10 deep breaths, practice coughing to be sure your lungs are clear. If you have an incision (the cut made at the time of surgery), support your incision when coughing by placing a pillow or rolled up towels firmly against it. Once you are able to get out of bed, walk around indoors and cough well. You may stop using the incentive spirometer when instructed by your caregiver.  RISKS AND COMPLICATIONS  Take your time so you do not get dizzy or light-headed.  If you are in pain, you may need to take or ask for pain medication before doing incentive spirometry. It is harder to take a deep breath  if you are having pain. AFTER USE  Rest and breathe slowly and easily.  It can be helpful to keep track  of a log of your progress. Your caregiver can provide you with a simple table to help with this. If you are using the spirometer at home, follow these instructions: SEEK MEDICAL CARE IF:   You are having difficultly using the spirometer.  You have trouble using the spirometer as often as instructed.  Your pain medication is not giving enough relief while using the spirometer.  You develop fever of 100.5 F (38.1 C) or higher. SEEK IMMEDIATE MEDICAL CARE IF:   You cough up bloody sputum that had not been present before.  You develop fever of 102 F (38.9 C) or greater.  You develop worsening pain at or near the incision site. MAKE SURE YOU:   Understand these instructions.  Will watch your condition.  Will get help right away if you are not doing well or get worse. Document Released: 02/11/2007 Document Revised: 12/24/2011 Document Reviewed: 04/14/2007 Exit[employer] Patient Information 2014 [employer], [place].   ________________________________________________________________________  WHAT IS A BLOOD TRANSFUSION? Blood Transfusion Information  A transfusion is the replacement of blood or some of its parts. Blood is made up of multiple cells which provide different functions.  Red blood cells carry oxygen and are used for blood loss replacement.  White blood cells fight against infection.  Platelets control bleeding.  Plasma helps clot blood.  Other blood products are available for specialized needs, such as hemophilia or other clotting disorders. BEFORE THE TRANSFUSION  Who gives blood for transfusions?   Healthy volunteers who are fully evaluated to make sure their blood is safe. This is blood bank blood. Transfusion therapy is the safest it has ever been in the practice of medicine. Before blood is taken from a donor, a complete history is taken to make sure that person has no history of diseases nor engages in risky social behavior (examples are intravenous drug use or  sexual activity with multiple partners). The donor's travel history is screened to minimize risk of transmitting infections, such as malaria. The donated blood is tested for signs of infectious diseases, such as HIV and hepatitis. The blood is then tested to be sure it is compatible with you in order to minimize the chance of a transfusion reaction. If you or a relative donates blood, this is often done in anticipation of surgery and is not appropriate for emergency situations. It takes many days to process the donated blood. RISKS AND COMPLICATIONS Although transfusion therapy is very safe and saves many lives, the main dangers of transfusion include:   Getting an infectious disease.  Developing a transfusion reaction. This is an allergic reaction to something in the blood you were given. Every precaution is taken to prevent this. The decision to have a blood transfusion has been considered carefully by your caregiver before blood is given. Blood is not given unless the benefits outweigh the risks. AFTER THE TRANSFUSION  Right after receiving a blood transfusion, you will usually feel much better and more energetic. This is especially true if your red blood cells have gotten low (anemic). The transfusion raises the level of the red blood cells which carry oxygen, and this usually causes an energy increase.  The nurse administering the transfusion will monitor you carefully for complications. HOME CARE INSTRUCTIONS  No special instructions are needed after a transfusion. You may find your energy is better. Speak with your caregiver about any limitations  on activity for underlying diseases you may have. SEEK MEDICAL CARE IF:   Your condition is not improving after your transfusion.  You develop redness or irritation at the intravenous (IV) site. SEEK IMMEDIATE MEDICAL CARE IF:  Any of the following symptoms occur over the next 12 hours:  Shaking chills.  You have a temperature by mouth above  102 F (38.9 C), not controlled by medicine.  Chest, back, or muscle pain.  People around you feel you are not acting correctly or are confused.  Shortness of breath or difficulty breathing.  Dizziness and fainting.  You get a rash or develop hives.  You have a decrease in urine output.  Your urine turns a dark color or changes to pink, red, or brown. Any of the following symptoms occur over the next 10 days:  You have a temperature by mouth above 102 F (38.9 C), not controlled by medicine.  Shortness of breath.  Weakness after normal activity.  The white part of the eye turns yellow (jaundice).  You have a decrease in the amount of urine or are urinating less often.  Your urine turns a dark color or changes to pink, red, or brown. Document Released: 09/28/2000 Document Revised: 12/24/2011 Document Reviewed: 05/17/2008 [facility] Patient Information 2014 [place], [place].  _______________________________________________________________________

## 2020-12-02 NOTE — Progress Notes (Signed)
No egg or soy allergy known to patient  No issues with past sedation with any surgeries or procedures No intubation problems in the past  No FH of Malignant Hyperthermia No diet pills per patient No home 02 use per patient  No blood thinners per patient  Pt denies issues with constipation  No A fib or A flutter  EMMI video to pt or via MyChart  COVID 19 guidelines implemented in PV today with Pt and RN  Pt is not  vaccinated  for Covid 3-1 tues coivd test 1130 am [place] [facility]   Plenvu  Coupon given to pt in PV today , Code to Pharmacy and  NO PA's for preps discussed with pt In PV today  Discussed with pt there will be an out-of-pocket cost for prep and that varies from $0 to 70 dollars   Due to the COVID-19 pandemic we are asking patients to follow certain guidelines.  Pt aware of COVID protocols and [facility] guidelines   Pt came into PV today in a wheelchair and right leg braced- he states he can ambulate with crutches- 5 months ago he had to have a knee replacement joint removed for infection- he is awaiting on Dr Despina Hick to schedule knee replacement surgery - his insurance runs out 12-20-2020 and he is trying to have his colon prior to losing insurance- screen colon- he said he will be able to dress and undress with his wife's assistance and his crutches- he cannot bend his right ;leg with or without his brace-  TE Russella Dar

## 2020-12-06 ENCOUNTER — Other Ambulatory Visit: Payer: Self-pay

## 2020-12-06 ENCOUNTER — Other Ambulatory Visit (HOSPITAL_COMMUNITY)
Admission: RE | Admit: 2020-12-06 | Discharge: 2020-12-06 | Disposition: A | Discharge status: Home / Self Care | Payer: 59 | Source: Ambulatory Visit | Attending: Orthopedic Surgery | Admitting: Orthopedic Surgery

## 2020-12-06 ENCOUNTER — Encounter (HOSPITAL_COMMUNITY): Payer: Self-pay

## 2020-12-06 ENCOUNTER — Encounter (HOSPITAL_COMMUNITY)
Admission: RE | Admit: 2020-12-06 | Discharge: 2020-12-06 | Disposition: A | Discharge status: Home / Self Care | Payer: 59 | Source: Ambulatory Visit | Attending: Orthopedic Surgery | Admitting: Orthopedic Surgery

## 2020-12-06 DIAGNOSIS — I10 Essential (primary) hypertension: Secondary | ICD-10-CM | POA: Insufficient documentation

## 2020-12-06 DIAGNOSIS — Z20822 Contact with and (suspected) exposure to covid-19: Secondary | ICD-10-CM | POA: Insufficient documentation

## 2020-12-06 DIAGNOSIS — Z01818 Encounter for other preprocedural examination: Secondary | ICD-10-CM | POA: Insufficient documentation

## 2020-12-06 DIAGNOSIS — Z01812 Encounter for preprocedural laboratory examination: Secondary | ICD-10-CM | POA: Insufficient documentation

## 2020-12-06 LAB — CBC
HCT: 43.1 % (ref 39.0–52.0)
Hemoglobin: 13.5 g/dL (ref 13.0–17.0)
MCH: 27.6 pg (ref 26.0–34.0)
MCHC: 31.3 g/dL (ref 30.0–36.0)
MCV: 88 fL (ref 80.0–100.0)
Platelets: 317 10*3/uL (ref 150–400)
RBC: 4.9 MIL/uL (ref 4.22–5.81)
RDW: 15.9 % — ABNORMAL HIGH (ref 11.5–15.5)
WBC: 8 10*3/uL (ref 4.0–10.5)
nRBC: 0 % (ref 0.0–0.2)

## 2020-12-06 LAB — SURGICAL PCR SCREEN
MRSA, PCR: NEGATIVE
Staphylococcus aureus: NEGATIVE

## 2020-12-06 LAB — COMPREHENSIVE METABOLIC PANEL
ALT: 23 U/L (ref 0–44)
AST: 29 U/L (ref 15–41)
Albumin: 3.5 g/dL (ref 3.5–5.0)
Alkaline Phosphatase: 65 U/L (ref 38–126)
Anion gap: 9 (ref 5–15)
BUN: 18 mg/dL (ref 6–20)
CO2: 26 mmol/L (ref 22–32)
Calcium: 8.7 mg/dL — ABNORMAL LOW (ref 8.9–10.3)
Chloride: 103 mmol/L (ref 98–111)
Creatinine, Ser: 0.87 mg/dL (ref 0.61–1.24)
GFR, Estimated: >60 mL/min (ref >60)
Glucose, Bld: 99 mg/dL (ref 70–99)
Potassium: 4.3 mmol/L (ref 3.5–5.1)
Sodium: 138 mmol/L (ref 135–145)
Total Bilirubin: 0.9 mg/dL (ref 0.3–1.2)
Total Protein: 7.2 g/dL (ref 6.5–8.1)

## 2020-12-06 LAB — PROTIME-INR
INR: 1.1 (ref 0.8–1.2)
Prothrombin Time: 13.7 seconds (ref 11.4–15.2)

## 2020-12-06 LAB — SARS CORONAVIRUS 2 (TAT 6-24 HRS): SARS Coronavirus 2: NEGATIVE

## 2020-12-06 LAB — APTT: aPTT: 30 seconds (ref 24–36)

## 2020-12-06 MED ORDER — BUPIVACAINE LIPOSOME 1.3 % IJ SUSP
20.0000 mL | Freq: Once | INTRAMUSCULAR | Status: DC
  Filled 2020-12-06: qty 20

## 2020-12-06 MED ORDER — DEXTROSE 5 % IV SOLN
3.0000 g | INTRAVENOUS | Status: AC
  Administered 2020-12-07: 3 g via INTRAVENOUS
  Filled 2020-12-06: qty 3

## 2020-12-07 ENCOUNTER — Encounter (HOSPITAL_COMMUNITY)
Admission: RE | Disposition: A | Discharge status: Home / Self Care | Payer: Self-pay | Source: Home / Self Care | Attending: Orthopedic Surgery

## 2020-12-07 ENCOUNTER — Inpatient Hospital Stay (HOSPITAL_COMMUNITY): Payer: 59 | Admitting: Anesthesiology

## 2020-12-07 ENCOUNTER — Encounter (HOSPITAL_COMMUNITY): Payer: Self-pay | Admitting: Orthopedic Surgery

## 2020-12-07 ENCOUNTER — Inpatient Hospital Stay (HOSPITAL_COMMUNITY)
Admission: RE | Admit: 2020-12-07 | Discharge: 2020-12-09 | DRG: 464 | Disposition: A | Discharge status: Home Health | Payer: 59 | Attending: Orthopedic Surgery | Admitting: Orthopedic Surgery

## 2020-12-07 DIAGNOSIS — T8453XA Infection and inflammatory reaction due to internal right knee prosthesis, initial encounter: Principal | ICD-10-CM | POA: Diagnosis present

## 2020-12-07 DIAGNOSIS — Z880 Allergy status to penicillin: Secondary | ICD-10-CM | POA: Diagnosis not present

## 2020-12-07 DIAGNOSIS — J45909 Unspecified asthma, uncomplicated: Secondary | ICD-10-CM | POA: Diagnosis present

## 2020-12-07 DIAGNOSIS — Z20822 Contact with and (suspected) exposure to covid-19: Secondary | ICD-10-CM | POA: Diagnosis present

## 2020-12-07 DIAGNOSIS — Z8619 Personal history of other infectious and parasitic diseases: Principal | ICD-10-CM

## 2020-12-07 DIAGNOSIS — M1990 Unspecified osteoarthritis, unspecified site: Secondary | ICD-10-CM | POA: Diagnosis present

## 2020-12-07 DIAGNOSIS — Z888 Allergy status to other drugs, medicaments and biological substances status: Secondary | ICD-10-CM | POA: Diagnosis not present

## 2020-12-07 DIAGNOSIS — Z800 Family history of malignant neoplasm of digestive organs: Secondary | ICD-10-CM | POA: Diagnosis not present

## 2020-12-07 DIAGNOSIS — Z8249 Family history of ischemic heart disease and other diseases of the circulatory system: Secondary | ICD-10-CM | POA: Diagnosis not present

## 2020-12-07 DIAGNOSIS — Z7951 Long term (current) use of inhaled steroids: Secondary | ICD-10-CM

## 2020-12-07 DIAGNOSIS — Z96653 Presence of artificial knee joint, bilateral: Secondary | ICD-10-CM | POA: Diagnosis present

## 2020-12-07 DIAGNOSIS — Z882 Allergy status to sulfonamides status: Secondary | ICD-10-CM | POA: Diagnosis not present

## 2020-12-07 DIAGNOSIS — Z79899 Other long term (current) drug therapy: Secondary | ICD-10-CM

## 2020-12-07 DIAGNOSIS — Z87891 Personal history of nicotine dependence: Secondary | ICD-10-CM

## 2020-12-07 DIAGNOSIS — Z881 Allergy status to other antibiotic agents status: Secondary | ICD-10-CM

## 2020-12-07 DIAGNOSIS — Z833 Family history of diabetes mellitus: Secondary | ICD-10-CM | POA: Diagnosis not present

## 2020-12-07 DIAGNOSIS — D508 Other iron deficiency anemias: Secondary | ICD-10-CM | POA: Diagnosis present

## 2020-12-07 DIAGNOSIS — Z6841 Body Mass Index (BMI) 40.0 and over, adult: Secondary | ICD-10-CM | POA: Diagnosis not present

## 2020-12-07 DIAGNOSIS — K219 Gastro-esophageal reflux disease without esophagitis: Secondary | ICD-10-CM | POA: Diagnosis present

## 2020-12-07 DIAGNOSIS — M069 Rheumatoid arthritis, unspecified: Secondary | ICD-10-CM | POA: Diagnosis present

## 2020-12-07 DIAGNOSIS — Z9889 Other specified postprocedural states: Secondary | ICD-10-CM | POA: Diagnosis present

## 2020-12-07 HISTORY — PX: REIMPLANTATION OF TOTAL KNEE: SHX6052

## 2020-12-07 LAB — TYPE AND SCREEN
ABO/RH(D): O POS
Antibody Screen: NEGATIVE

## 2020-12-07 SURGERY — REVISION, TOTAL ARTHROPLASTY, KNEE
Anesthesia: Regional | Site: Knee | Laterality: Right

## 2020-12-07 MED ORDER — MOMETASONE FURO-FORMOTEROL FUM 200-5 MCG/ACT IN AERO
2.0000 | INHALATION_SPRAY | Freq: Two times a day (BID) | RESPIRATORY_TRACT | Status: DC
  Administered 2020-12-07 – 2020-12-09 (×4): 2 via RESPIRATORY_TRACT
  Filled 2020-12-07: qty 8.8

## 2020-12-07 MED ORDER — OXYCODONE HCL 5 MG PO TABS
5.0000 mg | ORAL_TABLET | Freq: Once | ORAL | Status: DC | PRN
Start: 2020-12-07 — End: 2020-12-07

## 2020-12-07 MED ORDER — DEXAMETHASONE SODIUM PHOSPHATE 10 MG/ML IJ SOLN
8.0000 mg | Freq: Once | INTRAMUSCULAR | Status: AC
  Administered 2020-12-07: 4 mg via INTRAVENOUS

## 2020-12-07 MED ORDER — DILTIAZEM HCL ER COATED BEADS 240 MG PO CP24
240.0000 mg | ORAL_CAPSULE | Freq: Every day | ORAL | Status: DC
  Administered 2020-12-08 – 2020-12-09 (×2): 240 mg via ORAL
  Filled 2020-12-07 (×2): qty 1

## 2020-12-07 MED ORDER — METHOCARBAMOL 500 MG PO TABS
500.0000 mg | ORAL_TABLET | Freq: Four times a day (QID) | ORAL | Status: DC | PRN
  Administered 2020-12-07 – 2020-12-09 (×6): 500 mg via ORAL
  Filled 2020-12-07 (×6): qty 1

## 2020-12-07 MED ORDER — LIDOCAINE 2% (20 MG/ML) 5 ML SYRINGE
INTRAMUSCULAR | Status: DC | PRN
  Administered 2020-12-07: 80 mg via INTRAVENOUS

## 2020-12-07 MED ORDER — ONDANSETRON HCL 4 MG PO TABS: 4.0000 mg | ORAL_TABLET | Freq: Four times a day (QID) | ORAL | Status: DC | PRN

## 2020-12-07 MED ORDER — ONDANSETRON HCL 4 MG/2ML IJ SOLN: 4.0000 mg | Freq: Four times a day (QID) | INTRAMUSCULAR | Status: DC | PRN

## 2020-12-07 MED ORDER — TRAMADOL HCL 50 MG PO TABS
50.0000 mg | ORAL_TABLET | Freq: Four times a day (QID) | ORAL | Status: DC | PRN
  Administered 2020-12-07 – 2020-12-09 (×5): 100 mg via ORAL
  Filled 2020-12-07 (×5): qty 2

## 2020-12-07 MED ORDER — DOCUSATE SODIUM 100 MG PO CAPS
100.0000 mg | ORAL_CAPSULE | Freq: Two times a day (BID) | ORAL | Status: DC
  Administered 2020-12-07 – 2020-12-09 (×4): 100 mg via ORAL
  Filled 2020-12-07 (×4): qty 1

## 2020-12-07 MED ORDER — TRANEXAMIC ACID-NACL 1000-0.7 MG/100ML-% IV SOLN
1000.0000 mg | INTRAVENOUS | Status: AC
  Administered 2020-12-07: 1000 mg via INTRAVENOUS
  Filled 2020-12-07: qty 100

## 2020-12-07 MED ORDER — OXYCODONE HCL 5 MG PO TABS
10.0000 mg | ORAL_TABLET | ORAL | Status: DC | PRN
  Filled 2020-12-07: qty 3

## 2020-12-07 MED ORDER — ONDANSETRON HCL 4 MG/2ML IJ SOLN
INTRAMUSCULAR | Status: DC | PRN
  Administered 2020-12-07: 4 mg via INTRAVENOUS

## 2020-12-07 MED ORDER — LACTATED RINGERS IV SOLN: INTRAVENOUS | Status: DC

## 2020-12-07 MED ORDER — BUPIVACAINE HCL 0.25 % IJ SOLN
INTRAMUSCULAR | Status: AC
  Filled 2020-12-07: qty 1

## 2020-12-07 MED ORDER — METHOCARBAMOL 1000 MG/10ML IJ SOLN
500.0000 mg | Freq: Four times a day (QID) | INTRAVENOUS | Status: DC | PRN
  Filled 2020-12-07: qty 5

## 2020-12-07 MED ORDER — ASPIRIN EC 325 MG PO TBEC
325.0000 mg | DELAYED_RELEASE_TABLET | Freq: Two times a day (BID) | ORAL | Status: DC
  Administered 2020-12-08 – 2020-12-09 (×3): 325 mg via ORAL
  Filled 2020-12-07 (×4): qty 1

## 2020-12-07 MED ORDER — CEFAZOLIN SODIUM-DEXTROSE 2-4 GM/100ML-% IV SOLN
2.0000 g | Freq: Four times a day (QID) | INTRAVENOUS | Status: AC
  Administered 2020-12-07 – 2020-12-08 (×2): 2 g via INTRAVENOUS
  Filled 2020-12-07 (×2): qty 100

## 2020-12-07 MED ORDER — ONDANSETRON HCL 4 MG/2ML IJ SOLN: 4.0000 mg | Freq: Once | INTRAMUSCULAR | Status: DC | PRN

## 2020-12-07 MED ORDER — FLEET ENEMA 7-19 GM/118ML RE ENEM: 1.0000 | ENEMA | Freq: Once | RECTAL | Status: DC | PRN

## 2020-12-07 MED ORDER — POVIDONE-IODINE 10 % EX SWAB
2.0000 "application " | Freq: Once | CUTANEOUS | Status: AC
  Administered 2020-12-07: 2 via TOPICAL

## 2020-12-07 MED ORDER — CHLORHEXIDINE GLUCONATE 0.12 % MT SOLN
15.0000 mL | Freq: Once | OROMUCOSAL | Status: AC
  Administered 2020-12-07: 15 mL via OROMUCOSAL

## 2020-12-07 MED ORDER — TRIAMTERENE-HCTZ 37.5-25 MG PO TABS
1.0000 | ORAL_TABLET | Freq: Every day | ORAL | Status: DC
  Administered 2020-12-08 – 2020-12-09 (×2): 1 via ORAL
  Filled 2020-12-07 (×3): qty 1

## 2020-12-07 MED ORDER — SCOPOLAMINE 1 MG/3DAYS TD PT72
MEDICATED_PATCH | TRANSDERMAL | Status: AC
  Filled 2020-12-07: qty 1

## 2020-12-07 MED ORDER — OXYCODONE HCL 5 MG PO TABS
5.0000 mg | ORAL_TABLET | ORAL | Status: DC | PRN
  Administered 2020-12-09: 10 mg via ORAL
  Filled 2020-12-07 (×2): qty 2

## 2020-12-07 MED ORDER — PHENOL 1.4 % MT LIQD: 1.0000 | OROMUCOSAL | Status: DC | PRN

## 2020-12-07 MED ORDER — MORPHINE SULFATE (PF) 2 MG/ML IV SOLN: 0.5000 mg | INTRAVENOUS | Status: DC | PRN

## 2020-12-07 MED ORDER — FENTANYL CITRATE (PF) 100 MCG/2ML IJ SOLN
INTRAMUSCULAR | Status: AC
  Filled 2020-12-07: qty 2

## 2020-12-07 MED ORDER — PROPOFOL 10 MG/ML IV BOLUS
INTRAVENOUS | Status: DC | PRN
  Administered 2020-12-07: 50 mg via INTRAVENOUS
  Administered 2020-12-07: 150 mg via INTRAVENOUS
  Administered 2020-12-07 (×2): 50 mg via INTRAVENOUS

## 2020-12-07 MED ORDER — IPRATROPIUM-ALBUTEROL 20-100 MCG/ACT IN AERS
1.0000 | INHALATION_SPRAY | Freq: Four times a day (QID) | RESPIRATORY_TRACT | Status: DC | PRN
  Filled 2020-12-07: qty 4

## 2020-12-07 MED ORDER — ACETAMINOPHEN 500 MG PO TABS
1000.0000 mg | ORAL_TABLET | Freq: Four times a day (QID) | ORAL | Status: AC
  Administered 2020-12-08 (×4): 1000 mg via ORAL
  Filled 2020-12-07 (×4): qty 2

## 2020-12-07 MED ORDER — METOCLOPRAMIDE HCL 5 MG PO TABS: 5.0000 mg | ORAL_TABLET | Freq: Three times a day (TID) | ORAL | Status: DC | PRN

## 2020-12-07 MED ORDER — MIDAZOLAM HCL 2 MG/2ML IJ SOLN
INTRAMUSCULAR | Status: AC
  Filled 2020-12-07: qty 2

## 2020-12-07 MED ORDER — PROPOFOL 10 MG/ML IV BOLUS
INTRAVENOUS | Status: AC
  Filled 2020-12-07: qty 20

## 2020-12-07 MED ORDER — FENTANYL CITRATE (PF) 100 MCG/2ML IJ SOLN
25.0000 ug | INTRAMUSCULAR | Status: DC | PRN
  Administered 2020-12-07 (×2): 50 ug via INTRAVENOUS

## 2020-12-07 MED ORDER — SODIUM CHLORIDE 0.9 % IV SOLN: INTRAVENOUS | Status: DC

## 2020-12-07 MED ORDER — LOSARTAN POTASSIUM 50 MG PO TABS
100.0000 mg | ORAL_TABLET | Freq: Every day | ORAL | Status: DC
  Administered 2020-12-08 – 2020-12-09 (×2): 100 mg via ORAL
  Filled 2020-12-07 (×2): qty 2

## 2020-12-07 MED ORDER — METOCLOPRAMIDE HCL 5 MG/ML IJ SOLN
5.0000 mg | Freq: Three times a day (TID) | INTRAMUSCULAR | Status: DC | PRN
Start: 2020-12-07 — End: 2020-12-09

## 2020-12-07 MED ORDER — SODIUM CHLORIDE 0.9 % IR SOLN
Status: DC | PRN
  Administered 2020-12-07: 3000 mL

## 2020-12-07 MED ORDER — BISACODYL 10 MG RE SUPP: 10.0000 mg | Freq: Every day | RECTAL | Status: DC | PRN

## 2020-12-07 MED ORDER — DEXMEDETOMIDINE (PRECEDEX) IN NS 20 MCG/5ML (4 MCG/ML) IV SYRINGE
PREFILLED_SYRINGE | INTRAVENOUS | Status: DC | PRN
  Administered 2020-12-07: 8 ug via INTRAVENOUS
  Administered 2020-12-07: 20 ug via INTRAVENOUS
  Administered 2020-12-07: 12 ug via INTRAVENOUS

## 2020-12-07 MED ORDER — DEXAMETHASONE SODIUM PHOSPHATE 10 MG/ML IJ SOLN
10.0000 mg | Freq: Once | INTRAMUSCULAR | Status: AC
  Administered 2020-12-08: 10 mg via INTRAVENOUS
  Filled 2020-12-07: qty 1

## 2020-12-07 MED ORDER — EPHEDRINE SULFATE-NACL 50-0.9 MG/10ML-% IV SOSY
PREFILLED_SYRINGE | INTRAVENOUS | Status: DC | PRN
  Administered 2020-12-07: 10 mg via INTRAVENOUS
  Administered 2020-12-07: 5 mg via INTRAVENOUS
  Administered 2020-12-07: 10 mg via INTRAVENOUS

## 2020-12-07 MED ORDER — BUPIVACAINE HCL (PF) 0.5 % IJ SOLN
INTRAMUSCULAR | Status: DC | PRN
  Administered 2020-12-07: 20 mL via PERINEURAL

## 2020-12-07 MED ORDER — ORAL CARE MOUTH RINSE: 15.0000 mL | Freq: Once | OROMUCOSAL | Status: AC

## 2020-12-07 MED ORDER — SCOPOLAMINE 1 MG/3DAYS TD PT72
MEDICATED_PATCH | TRANSDERMAL | Status: DC | PRN
  Administered 2020-12-07: 1 via TRANSDERMAL

## 2020-12-07 MED ORDER — FENTANYL CITRATE (PF) 100 MCG/2ML IJ SOLN
INTRAMUSCULAR | Status: DC | PRN
  Administered 2020-12-07: 50 ug via INTRAVENOUS
  Administered 2020-12-07: 25 ug via INTRAVENOUS
  Administered 2020-12-07 (×2): 50 ug via INTRAVENOUS
  Administered 2020-12-07: 75 ug via INTRAVENOUS
  Administered 2020-12-07 (×2): 25 ug via INTRAVENOUS

## 2020-12-07 MED ORDER — MENTHOL 3 MG MT LOZG: 1.0000 | LOZENGE | OROMUCOSAL | Status: DC | PRN

## 2020-12-07 MED ORDER — METOPROLOL SUCCINATE ER 50 MG PO TB24
100.0000 mg | ORAL_TABLET | Freq: Every day | ORAL | Status: DC
  Administered 2020-12-08 – 2020-12-09 (×2): 100 mg via ORAL
  Filled 2020-12-07 (×2): qty 2

## 2020-12-07 MED ORDER — KETOROLAC TROMETHAMINE 15 MG/ML IJ SOLN: 15.0000 mg | Freq: Once | INTRAMUSCULAR | Status: AC

## 2020-12-07 MED ORDER — ACETAMINOPHEN 10 MG/ML IV SOLN
1000.0000 mg | Freq: Four times a day (QID) | INTRAVENOUS | Status: DC
  Administered 2020-12-07: 1000 mg via INTRAVENOUS
  Filled 2020-12-07: qty 100

## 2020-12-07 MED ORDER — FENTANYL CITRATE (PF) 100 MCG/2ML IJ SOLN
INTRAMUSCULAR | Status: AC
  Administered 2020-12-07: 50 ug via INTRAVENOUS
  Filled 2020-12-07: qty 2

## 2020-12-07 MED ORDER — MIDAZOLAM HCL 2 MG/2ML IJ SOLN
1.0000 mg | INTRAMUSCULAR | Status: DC
  Administered 2020-12-07: 1 mg via INTRAVENOUS
  Filled 2020-12-07: qty 2

## 2020-12-07 MED ORDER — POLYETHYLENE GLYCOL 3350 17 G PO PACK: 17.0000 g | PACK | Freq: Every day | ORAL | Status: DC | PRN

## 2020-12-07 MED ORDER — PANTOPRAZOLE SODIUM 40 MG PO TBEC
40.0000 mg | DELAYED_RELEASE_TABLET | Freq: Every day | ORAL | Status: DC
  Administered 2020-12-08 – 2020-12-09 (×2): 40 mg via ORAL
  Filled 2020-12-07 (×2): qty 1

## 2020-12-07 MED ORDER — 0.9 % SODIUM CHLORIDE (POUR BTL) OPTIME
TOPICAL | Status: DC | PRN
  Administered 2020-12-07: 1000 mL

## 2020-12-07 MED ORDER — OXYCODONE HCL 5 MG/5ML PO SOLN
5.0000 mg | Freq: Once | ORAL | Status: DC | PRN
Start: 2020-12-07 — End: 2020-12-07

## 2020-12-07 MED ORDER — DIPHENHYDRAMINE HCL 12.5 MG/5ML PO ELIX: 12.5000 mg | ORAL_SOLUTION | ORAL | Status: DC | PRN

## 2020-12-07 MED ORDER — KETOROLAC TROMETHAMINE 15 MG/ML IJ SOLN
INTRAMUSCULAR | Status: AC
  Administered 2020-12-07: 15 mg via INTRAVENOUS
  Filled 2020-12-07: qty 1

## 2020-12-07 MED ORDER — FENTANYL CITRATE (PF) 100 MCG/2ML IJ SOLN
50.0000 ug | INTRAMUSCULAR | Status: DC
  Administered 2020-12-07: 50 ug via INTRAVENOUS
  Filled 2020-12-07: qty 2

## 2020-12-07 MED ORDER — AMISULPRIDE (ANTIEMETIC) 5 MG/2ML IV SOLN: 10.0000 mg | Freq: Once | INTRAVENOUS | Status: DC | PRN

## 2020-12-07 SURGICAL SUPPLY — 60 items
BAG SPEC THK2 15X12 ZIP CLS (MISCELLANEOUS) ×1
BAG ZIPLOCK 12X15 (MISCELLANEOUS) ×2 IMPLANT
BLADE SAG 18X100X1.27 (BLADE) ×2 IMPLANT
BLADE SAW SGTL 11.0X1.19X90.0M (BLADE) ×2 IMPLANT
BNDG CMPR MED 10X6 ELC LF (GAUZE/BANDAGES/DRESSINGS) ×1
BNDG ELASTIC 6X10 VLCR STRL LF (GAUZE/BANDAGES/DRESSINGS) ×1 IMPLANT
BNDG ELASTIC 6X5.8 VLCR STR LF (GAUZE/BANDAGES/DRESSINGS) ×2 IMPLANT
BONE CEMENT GENTAMICIN (Cement) ×6 IMPLANT
CEMENT BONE GENTAMICIN 40 (Cement) ×3 IMPLANT
CEMENT RESTRICTOR DEPUY SZ 4 (Cement) ×3 IMPLANT
COVER SURGICAL LIGHT HANDLE (MISCELLANEOUS) ×2 IMPLANT
COVER WAND RF STERILE (DRAPES) IMPLANT
CUFF TOURN SGL QUICK 34 (TOURNIQUET CUFF) ×2
CUFF TRNQT CYL 34X4.125X (TOURNIQUET CUFF) ×1 IMPLANT
DRAPE U-SHAPE 47X51 STRL (DRAPES) ×2 IMPLANT
DRSG ADAPTIC 3X8 NADH LF (GAUZE/BANDAGES/DRESSINGS) ×2 IMPLANT
DRSG AQUACEL AG ADV 3.5X10 (GAUZE/BANDAGES/DRESSINGS) ×1 IMPLANT
DRSG PAD ABDOMINAL 8X10 ST (GAUZE/BANDAGES/DRESSINGS) ×2 IMPLANT
DURAPREP 26ML APPLICATOR (WOUND CARE) ×2 IMPLANT
ELECT REM PT RETURN 15FT ADLT (MISCELLANEOUS) ×2 IMPLANT
EVACUATOR 1/8 PVC DRAIN (DRAIN) ×3 IMPLANT
GAUZE SPONGE 4X4 12PLY STRL (GAUZE/BANDAGES/DRESSINGS) ×2 IMPLANT
GLOVE SRG 8 PF TXTR STRL LF DI (GLOVE) ×3 IMPLANT
GLOVE SURG ENC MOIS LTX SZ7.5 (GLOVE) ×2 IMPLANT
GLOVE SURG ENC MOIS LTX SZ8 (GLOVE) ×2 IMPLANT
GLOVE SURG POLYISO LF SZ7 (GLOVE) ×2 IMPLANT
GLOVE SURG UNDER POLY LF SZ7 (GLOVE) ×2 IMPLANT
GLOVE SURG UNDER POLY LF SZ8 (GLOVE) ×6
GOWN STRL REUS W/TWL LRG LVL3 (GOWN DISPOSABLE) ×6 IMPLANT
HANDPIECE INTERPULSE COAX TIP (DISPOSABLE) ×2
HIP STEM FEM SROM MED RT (Knees) ×2 IMPLANT
IMMOBILIZER KNEE 20 (SOFTGOODS) ×2
IMMOBILIZER KNEE 20 THIGH 36 (SOFTGOODS) ×1 IMPLANT
INSERT TIB MOD LPS MD 21 (Insert) ×1 IMPLANT
KIT TURNOVER KIT A (KITS) ×2 IMPLANT
MANIFOLD NEPTUNE II (INSTRUMENTS) ×2 IMPLANT
NS IRRIG 1000ML POUR BTL (IV SOLUTION) ×2 IMPLANT
PADDING CAST COTTON 6X4 STRL (CAST SUPPLIES) ×3 IMPLANT
PENCIL SMOKE EVACUATOR (MISCELLANEOUS) IMPLANT
PROTECTOR NERVE ULNAR (MISCELLANEOUS) ×2 IMPLANT
SET HNDPC FAN SPRY TIP SCT (DISPOSABLE) ×1 IMPLANT
SLEEVE FEM UNIV FULL PRO SZ34 (Sleeve) ×1 IMPLANT
STEM HIP FEM SROM MED RT (Knees) IMPLANT
STEM TIBIA PFC 13X30MM (Stem) ×1 IMPLANT
STEM UNIVERSAL REVISION 115X20 (Stem) ×1 IMPLANT
SUT PDS AB 1 CT1 27 (SUTURE) IMPLANT
SUT PDS AB 1 CTX 36 (SUTURE) ×1 IMPLANT
SUT STRATAFIX 0 PDS 27 VIOLET (SUTURE) ×2
SUT VIC AB 2-0 CT1 27 (SUTURE) ×6
SUT VIC AB 2-0 CT1 TAPERPNT 27 (SUTURE) ×3 IMPLANT
SUTURE STRATFX 0 PDS 27 VIOLET (SUTURE) ×1 IMPLANT
SWAB COLLECTION DEVICE MRSA (MISCELLANEOUS) ×2 IMPLANT
SWAB CULTURE ESWAB REG 1ML (MISCELLANEOUS) ×2 IMPLANT
SYR 50ML LL SCALE MARK (SYRINGE) IMPLANT
TOWER CARTRIDGE SMART MIX (DISPOSABLE) ×2 IMPLANT
TRAY FOLEY MTR SLVR 16FR STAT (SET/KITS/TRAYS/PACK) ×2 IMPLANT
TRAY SLEEVE POROUS 61 ×1 IMPLANT
TRAY TIBIAL MBT REVISION (Knees) ×1 IMPLANT
WATER STERILE IRR 1000ML POUR (IV SOLUTION) ×2 IMPLANT
WRAP KNEE MAXI GEL POST OP (GAUZE/BANDAGES/DRESSINGS) ×4 IMPLANT

## 2020-12-07 NOTE — Anesthesia Postprocedure Evaluation (Signed)
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  Last Vitals:  Vitals:   12/07/20 1900 12/07/20 1915  BP: 132/72 (!) 161/91  Pulse: (!) 58 (!) 53  Resp: 15 17  Temp:    SpO2: 92% 95%    Last Pain:  Vitals:   12/07/20 1915  TempSrc:   PainSc: Asleep                 Mellody Dance

## 2020-12-07 NOTE — Interval H&P Note (Signed)
History and Physical Interval Note:  12/07/2020 1:33 PM  [name]  has presented today for surgery, with the diagnosis of Right knee resection arthroplasty.  The various methods of treatment have been discussed with the patient and family. After consideration of risks, benefits and other options for treatment, the patient has consented to  Procedure(s) with comments: REIMPLANTATION OF RIGHT TOTAL KNEE (Right) - 2 hrs as a surgical intervention.  The patient's history has been reviewed, patient examined, no change in status, stable for surgery.  I have reviewed the patient's chart and labs.  Questions were answered to the patient's satisfaction.     [name]

## 2020-12-07 NOTE — Anesthesia Procedure Notes (Signed)
Anesthesia Regional Block: Adductor canal block   Pre-Anesthetic Checklist: ,, timeout performed, Correct Patient, Correct Site, Correct Laterality, Correct Procedure, Correct Position, site marked, Risks and benefits discussed,  Surgical consent,  Pre-op evaluation,  At surgeon's request and post-op pain management  Laterality: Right  Prep: chloraprep       Needles:  Injection technique: Single-shot  Needle Type: Echogenic Stimulator Needle     Needle Length: 10cm      Additional Needles:   Procedures:,,,, ultrasound used (permanent image in chart),,,,  Narrative:  Start time: 12/07/2020 2:00 PM End time: 12/07/2020 2:05 PM Injection made incrementally with aspirations every 5 mL.  Performed by: Personally  Anesthesiologist: Mellody Dance, MD  Additional Notes: A functioning IV was confirmed and monitors were applied.  Sterile prep and drape, hand hygiene and sterile gloves were used.  Negative aspiration and test dose prior to incremental administration of local anesthetic. The patient tolerated the procedure well.Ultrasound  guidance: relevant anatomy identified, needle position confirmed, local anesthetic spread visualized around nerve(s), vascular puncture avoided.  Image printed for medical record.

## 2020-12-07 NOTE — Progress Notes (Signed)
Assisted Dr. Bass with right, ultrasound guided, adductor canal block. Side rails up, monitors on throughout procedure. See vital signs in flow sheet. Tolerated Procedure well.  

## 2020-12-07 NOTE — H&P (Signed)
ADMISSION H&P  Patient is being admitted for right total knee arthroplasty reimplantation.  Subjective:  Chief Complaint: Status post right total knee arthroplasty resection  HPI: [name], 60 y.o. 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 He is approximately five months out from resection arthroplasty with antibiotic spacer. Knee has improved following his most recent surgery and has been unchanged for the past 2-4 weeks. Denies any fever, chills, or warmth about the knee. CRP and sed rate were obtained on 11/29/20, which came back at 32.7 and 25 respectively.Has been off antibiotics since 11/17/2019 without any worsening or new symptoms. The possibility of reimplantating the right knee has been discussed at length with the patient throughout this process and both the patient and his wife are ready to proceed.   Patient Active Problem List   Diagnosis Date Noted  . Transaminitis 09/15/2020  . Immunization due 09/15/2020  . Septic arthritis of knee ([place]) 08/26/2017  . Medication monitoring encounter 11/27/2016  . Allergy history, drug 10/24/2016  . Septic arthritis of knee, right ([place]) 04/25/2016  . 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 Prepatellar bursitis of right knee 09/28/2014  . Infected prepatellar bursa 09/28/2014  . OA (osteoarthritis) of knee 05/21/2014  . Infection of prosthetic right knee joint ([place]) 03/18/2014  . Cellulitis and abscess of trunk 02/15/2014    Class: Acute  . Anemia 02/15/2014    Class: Chronic  . Cellulitis of leg, right 01/13/2013    Class: Acute  . Rheumatoid arthritis ([place]) 01/13/2013    Class: Chronic  . Obstructive sleep apnea 01/13/2013    Class: Chronic  . Chronic venous insufficiency 01/13/2013    Class: Chronic    Past Medical History:  Diagnosis Date  . Allergy    OCC   . Anemia    hx of  . Asthma   . Cataract    small- forming   . Cellulitis of lower extremity     "usually RLL; this time it's got up to my lower abdoment" (02/11/2014)-series of antibiotics completed 02-28-14  . Diverticulosis   . Edema    legs  . GERD (gastroesophageal reflux disease)   . Hypertension   . Infection of right knee ([place])   . OSA (obstructive sleep apnea)    does not use cpap   . Pneumonia    20+ years ago  . PONV (postoperative nausea and vomiting)   . Rheumatoid arthritis ([place]) dx'd ~ 1977  . S/P PICC central line placement 03-16-14   right upper arm remains intact.05-11-14 removed 1 week ago.  . Sleep apnea    no cpap     Past Surgical History:  Procedure Laterality Date  . COLONOSCOPY  2012  . EXCISIONAL HEMORRHOIDECTOMY    . EXCISIONAL TOTAL KNEE ARTHROPLASTY WITH ANTIBIOTIC SPACERS Right 03/18/2014   Procedure: RIGHT KNEE RESECTION ARTHROPLASTY WITH ANTIBIOTIC SPACERS;  Surgeon: [name], MD;  Location: [facility];  Service: Orthopedics;  Laterality: Right;  . EXCISIONAL TOTAL KNEE ARTHROPLASTY WITH ANTIBIOTIC SPACERS Right 06/22/2020   Procedure: Right knee resection arthroplasty, antibiotic spacer;  Surgeon: Gaynelle Arabian, MD;  Location: [facility];  Service: Orthopedics;  Laterality: Right;  150mn  . FOREIGN BODY REMOVAL Left    "BB" removal above left eye-teen yrs.  .Marland KitchenHERNIA REPAIR    . I & D KNEE WITH POLY EXCHANGE Right 04/25/2016   Procedure: RIGHT KNEE IRRIGATION AND DEBRIDEMENT WITH POLY EXCHANGE;  Surgeon: FGaynelle Arabian MD;  Location: [facility];  Service: Orthopedics;  Laterality: Right;  . I & D KNEE WITH POLY EXCHANGE Right 10/01/2016   Procedure: IRRIGATION AND DEBRIDEMENT KNEE WITH POLY EXCHANGE;  Surgeon: Gaynelle Arabian, MD;  Location: [facility];  Service: Orthopedics;  Laterality: Right;  . I & D KNEE WITH POLY EXCHANGE Right 08/26/2017   Procedure: IRRIGATION AND DEBRIDEMENT KNEE WITH POLY EXCHANGE;  Surgeon: Gaynelle Arabian, MD;  Location: [facility];  Service: Orthopedics;  Laterality: Right;  . I & D KNEE WITH POLY EXCHANGE Right 11/16/2019   Procedure:  IRRIGATION AND DEBRIDEMENT KNEE WITH POLY EXCHANGE;  Surgeon: Gaynelle Arabian, MD;  Location: [facility];  Service: Orthopedics;  Laterality: Right;  21min  . I & D KNEE WITH POLY EXCHANGE Right 02/22/2020   Procedure: Right knee irrigation and debridement;  Surgeon: Gaynelle Arabian, MD;  Location: [facility];  Service: Orthopedics;  Laterality: Right;  59min  . INCISION AND DRAINAGE Right 09/28/2014   Procedure: INCISION AND DRAINAGE RIGHT KNEE;  Surgeon: [name], MD;  Location: [facility];  Service: Orthopedics;  Laterality: Right;  . KNEE BURSECTOMY Right 03/13/2017   Procedure: RIGHT KNEE PREPATELLAR BURSECTOMY;  Surgeon: Gaynelle Arabian, MD;  Location: [facility];  Service: Orthopedics;  Laterality: Right;  . picc line placement Right    right upper arm  . 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 REVISION TOTAL KNEE ARTHROPLASTY Right 2012  . SPIGELIAN HERNIA Right 12/04/2015   Procedure: INCARCERATED SPIGELIAN HERNIA REPAIR WITH MESH;  Surgeon: Georganna Skeans, MD;  Location: [facility];  Service: General;  Laterality: Right;  . TOTAL KNEE ARTHROPLASTY Right 05/21/2014   Procedure: RIGHT KNEE ARTHROPLASTY REINPLANTATION;  Surgeon: [name], MD;  Location: [facility];  Service: Orthopedics;  Laterality: Right;    Prior to Admission medications   Medication Sig Start Date End Date Taking? Authorizing Provider  Cholecalciferol (VITAMIN D3 PO) Take 2 tablets by mouth daily.   Yes [provider]  diltiazem (CARDIZEM CD) 240 MG 24 hr capsule Take 240 mg by mouth daily.  11/12/10  Yes [provider]  Fluticasone-Salmeterol (ADVAIR) 250-50 MCG/DOSE AEPB Inhale 1 puff into the lungs daily.   Yes [provider]  folic acid (FOLVITE) 1 MG tablet Take 1 mg by mouth daily.   Yes [provider]  Ipratropium-Albuterol (COMBIVENT RESPIMAT) 20-100 MCG/ACT  AERS respimat Inhale 1 puff into the lungs every 6 (six) hours as needed for wheezing or shortness of breath.   Yes [provider]  losartan (COZAAR) 100 MG tablet Take 100 mg by mouth daily.  11/12/10  Yes [provider]  methocarbamol (ROBAXIN) 500 MG tablet Take 1 tablet (500 mg total) by mouth every 6 (six) hours as needed for muscle spasms. 06/23/20  Yes Edmisten, Kristie L, PA  methotrexate (RHEUMATREX) 2.5 MG tablet Take 10 mg by mouth every Monday. Caution:Chemotherapy. Protect from light.   Yes [provider]  metoprolol succinate (TOPROL-XL) 100 MG 24 hr tablet Take 100 mg by mouth daily. Take with or immediately following a meal.   Yes [provider]  Multiple Vitamin (MULTIVITAMIN) tablet Take 1 tablet by mouth daily.   Yes [provider]  nystatin (MYCOSTATIN/NYSTOP) powder Apply 1 application topically 3 (three) times daily. 07/02/20  Yes Tommy Medal, Lavell Islam, MD  oxyCODONE (OXY IR/ROXICODONE) 5 MG immediate release tablet Take 1-2  tablets (5-10 mg total) by mouth every 6 (six) hours as needed for severe pain. 06/23/20  Yes Edmisten, Kristie L, PA  RABEprazole (ACIPHEX) 20 MG tablet Take 20 mg by mouth daily before breakfast.    Yes [provider]  traMADol (ULTRAM) 50 MG tablet Take 1-2 tablets (50-100 mg total) by mouth every 6 (six) hours as needed for moderate pain. 06/23/20  Yes Edmisten, Kristie L, PA  triamterene-hydrochlorothiazide (MAXZIDE-25) 37.5-25 MG tablet Take 1 tablet by mouth daily.   Yes [provider]  diphenoxylate-atropine (LOMOTIL) 2.5-0.025 MG/5ML liquid Take 5 mLs by mouth 4 (four) times daily as needed for diarrhea or loose stools. 07/02/20   Truman Hayward, MD  fluconazole (DIFLUCAN) 100 MG tablet Take 100 mg by mouth daily. 10/10/20   [provider]  HYDROMET 5-1.5 MG/5ML syrup Take by mouth. 09/13/20   [provider]  PEG-KCl-NaCl-NaSulf-Na Asc-C (PLENVU) 140 g SOLR Take 1  kit by mouth as directed. Manufacturer's coupon Universal coupon code:BIN: P2366821; GROUP: [id number]; PCN: [id number]; ID: [id number]; PAY NO MORE $50; NO prior authorization 12/02/20   Ladene Artist, MD  Semaglutide (RYBELSUS) 3 MG TABS Take by mouth daily.    [provider]    Allergies  Allergen Reactions  . Avelox [Moxifloxacin Hcl In Nacl] Hives, Shortness Of Breath and Itching    Tolerates Cipro  . Doxycycline Itching  . Rocephin [Ceftriaxone Sodium In Dextrose] Hives and Itching  . Sulfa Antibiotics Itching  . Vancomycin Hives and Itching  . E.E.S. [Erythromycin] Rash  . Erythrocin Rash  . Penicillins Rash    Tolerated augmentin   . Sulfasalazine Rash    Social History   Socioeconomic History  . Marital status: Married    Spouse name: Not on file  . Number of children: 1  . Years of education: 81  . Highest education level: Not on file  Occupational History  . Not on file  Tobacco Use  . Smoking status: Former Smoker    Packs/day: 1.00    Years: 3.00    Pack years: 3.00    Types: Cigarettes    Quit date: 03/16/1981    Years since quitting: 39.7  . Smokeless tobacco: Current User    Types: Snuff  . Tobacco comment: "quit smoking cigarettes in the 1980's"  Vaping Use  . Vaping Use: Never used  Substance and Sexual Activity  . Alcohol use: No  . Drug use: No  . Sexual activity: Yes    Partners: Male  Other Topics Concern  . Not on file  Social History Narrative  . Not on file   Social Determinants of Health   Financial Resource Strain: Not on file  Food Insecurity: Not on file  Transportation Needs: Not on file  Physical Activity: Not on file  Stress: Not on file  Social Connections: Not on file  Intimate Partner Violence: Not on file    Tobacco Use: High Risk  . Smoking Tobacco Use: Former Smoker  . Smokeless Tobacco Use: Current User   Social History   Substance and Sexual Activity  Alcohol Use No    Family History  Problem Relation  Age of Onset  . Colon cancer Mother 62       passed age 47 - stage 10   . Hypertension Mother   . Diabetes Mellitus II Paternal Grandmother   . Colon polyps Neg Hx   . Esophageal cancer Neg Hx   . Stomach cancer Neg Hx   .  Rectal cancer Neg Hx     Review of Systems  Constitutional: Negative for chills and fever.  HENT: Negative for congestion, sore throat and tinnitus.   Eyes: Negative for double vision, photophobia and pain.  Respiratory: Negative for cough, shortness of breath and wheezing.   Cardiovascular: Negative for chest pain, palpitations and orthopnea.  Gastrointestinal: Negative for heartburn, nausea and vomiting.  Genitourinary: Negative for dysuria, frequency and urgency.  Musculoskeletal: Negative for joint pain.  Neurological: Negative for dizziness, weakness and headaches.    Objective:  Physical Exam: The patient is a pleasant, well-developed male, alert and oriented, in no apparent distress.    Right knee exam:  This is the best his knee has looked at any point in the past 2 years.  There is absolutely no swelling, redness, or warmth.  The swelling in his lower leg is also greatly improved.    The patient's sensation and motor function are intact in their lower extremities. Their distal pulses are 2+. The bilateral calves are soft and nontender.  Labs have improved significantly. His sedimentation rate is now 25. C-reactive protein is down to 32, which is also the lowest it has been in the past several years.  Vital signs in last 24 hours: Temp:  [98 F (36.7 C)] 98 F (36.7 C) (02/22 1044) Pulse Rate:  [72] 72 (02/22 1044) Resp:  [16] 16 (02/22 1044) BP: (151)/(81) 151/81 (02/22 1044) SpO2:  [99 %] 99 % (02/22 1044) Weight:  [146.5 kg] 146.5 kg (02/22 1044)   Assessment/Plan:  History of prosthetic joint infection, s/p right TKA resection  We discussed the patient's presenting complaints, history, and treatment options. Despite being off  antibiotics for 2 weeks, Dwyane's knee continues to look better. He is not having any discomfort. The swelling is the least it has been in a couple of years. We cannot even do an aspiration today because there is no fluid to aspirate. The last time we did an aspiration, which was approximately 4 weeks ago, all the fluid came back normal. He wants to go ahead and get this fixed. We will go ahead and set him up for surgical treatment. It has been discussed with the patient and he is aware that if he does have a recurrent infection, it is going to lead to either amputation or fusion. They are willing to accept that risk and wish to proceed with a total knee arthroplasty reimplantation at this time.  Theresa Duty, PA-C Orthopedic Surgery [facility] [facility]

## 2020-12-07 NOTE — Anesthesia Preprocedure Evaluation (Addendum)
Anesthesia Evaluation  Patient identified by MRN, date of birth, ID band Patient awake    History of Anesthesia Complications (+) PONV and history of anesthetic complications  Airway Mallampati: III  TM Distance: >3 FB Neck ROM: Full    Dental no notable dental hx.    Pulmonary asthma , sleep apnea and Continuous Positive Airway Pressure Ventilation , former smoker,    Pulmonary exam normal breath sounds clear to auscultation       Cardiovascular Exercise Tolerance: Good hypertension, Normal cardiovascular exam Rhythm:Regular Rate:Normal     Neuro/Psych    GI/Hepatic Neg liver ROS, GERD  ,  Endo/Other  Morbid obesity  Renal/GU negative Renal ROS  negative genitourinary   Musculoskeletal  (+) Arthritis , Osteoarthritis,    Abdominal (+) + obese,   Peds negative pediatric ROS (+)  Hematology  (+) anemia ,   Anesthesia Other Findings   Reproductive/Obstetrics negative OB ROS                            Anesthesia Physical Anesthesia Plan  ASA: IV  Anesthesia Plan: General and Regional   Post-op Pain Management:    Induction: Intravenous  PONV Risk Score and Plan: 3  Airway Management Planned: LMA  Additional Equipment: None  Intra-op Plan:   Post-operative Plan: Extubation in OR  Informed Consent: I have reviewed the patients History and Physical, chart, labs and discussed the procedure including the risks, benefits and alternatives for the proposed anesthesia with the patient or authorized representative who has indicated his/her understanding and acceptance.       Plan Discussed with: CRNA, Anesthesiologist and Surgeon  Anesthesia Plan Comments: (Adductor canal block + GA/LMA)       Anesthesia Quick Evaluation

## 2020-12-07 NOTE — Discharge Instructions (Signed)
Vincent Gross, MD [facility] [address], [address] [place], [place] [address] [phone]  POSTOPERATIVE DIRECTIONS  BLOOD CLOT PREVENTION . Take a 325 mg Aspirin two times a day for three weeks following surgery. Then take an 81 mg Aspirin once a day for three weeks. Then discontinue Aspirin. Bonita Quin may resume your vitamins/supplements upon discharge from the hospital. . Do not take any NSAIDs (Advil, Aleve, Ibuprofen, Meloxicam, etc.) until you have discontinued the 325 mg Aspirin.  HOME CARE INSTRUCTIONS  . Remove items at home which could result in a fall. This includes throw rugs or furniture in walking pathways.  . ICE to the affected knee as much as tolerated. Icing helps control swelling. If the swelling is well controlled you will be more comfortable and rehab easier. Continue to use ice on the knee for pain and swelling from surgery. You may notice swelling that will progress down to the foot and ankle. This is normal after surgery. Elevate the leg when you are not up walking on it.    . Continue to use the breathing machine which will help keep your temperature down. It is common for your temperature to cycle up and down following surgery, especially at night when you are not up moving around and exerting yourself. The breathing machine keeps your lungs expanded and your temperature down. . Do not place pillow under the operative knee, focus on keeping the knee straight while resting  DIET You may resume your previous home diet once you are discharged from the hospital.  DRESSING / WOUND CARE / SHOWERING . Keep your bulky bandage on for 2 days. On the third post-operative day you may remove the Ace bandage and gauze. There is a waterproof adhesive bandage on your skin which will stay in place until your first follow-up appointment. Once you remove this you will not need to place another bandage . You may begin showering 3 days  following surgery, but do not submerge the incision under water.  ACTIVITY For the first 5 days, the key is rest and control of pain and swelling . Do your home exercises twice a day starting on post-operative day 3. On the days you go to physical therapy, just do the home exercises once that day. . You should rest, ice and elevate the leg for 50 minutes out of every hour. Get up and walk/stretch for 10 minutes per hour. After 5 days you can increase your activity slowly as tolerated. . Walk with your walker as instructed. Use the walker until you are comfortable transitioning to a cane. Walk with the cane in the opposite hand of the operative leg. You may discontinue the cane once you are comfortable and walking steadily. . Avoid periods of inactivity such as sitting longer than an hour when not asleep. This helps prevent blood clots.  . You may discontinue the knee immobilizer once you are able to perform a straight leg raise while lying down. . You may resume a sexual relationship in one month or when given the OK by your doctor.  . You may return to work once you are cleared by your doctor.  . Do not drive a car for 6 weeks or until released by your surgeon.  . Do not drive while taking narcotics.  TED HOSE STOCKINGS Wear the elastic stockings on both legs for three weeks following surgery during the day. You may remove them at night for sleeping.  WEIGHT BEARING Weight  bearing as tolerated with assist device (walker, cane, etc) as directed, use it as long as suggested by your surgeon or therapist, typically at least 4-6 weeks.  POSTOPERATIVE CONSTIPATION PROTOCOL Constipation - defined medically as fewer than three stools per week and severe constipation as less than one stool per week.  One of the most common issues patients have following surgery is constipation.  Even if you have a regular bowel pattern at home, your normal regimen is likely to be disrupted due to multiple reasons  following surgery.  Combination of anesthesia, postoperative narcotics, change in appetite and fluid intake all can affect your bowels.  In order to avoid complications following surgery, here are some recommendations in order to help you during your recovery period.  . Colace (docusate) - Pick up an over-the-counter form of Colace or another stool softener and take twice a day as long as you are requiring postoperative pain medications.  Take with a full glass of water daily.  If you experience loose stools or diarrhea, hold the colace until you stool forms back up. If your symptoms do not get better within 1 week or if they get worse, check with your doctor. . Dulcolax (bisacodyl) - Pick up over-the-counter and take as directed by the product packaging as needed to assist with the movement of your bowels.  Take with a full glass of water.  Use this product as needed if not relieved by Colace only.  . MiraLax (polyethylene glycol) - Pick up over-the-counter to have on hand. MiraLax is a solution that will increase the amount of water in your bowels to assist with bowel movements.  Take as directed and can mix with a glass of water, juice, soda, coffee, or tea. Take if you go more than two days without a movement. Do not use MiraLax more than once per day. Call your doctor if you are still constipated or irregular after using this medication for 7 days in a row.  If you continue to have problems with postoperative constipation, please contact the office for further assistance and recommendations.  If you experience "the worst abdominal pain ever" or develop nausea or vomiting, please contact the office immediatly for further recommendations for treatment.  ITCHING If you experience itching with your medications, try taking only a single pain pill, or even half a pain pill at a time.  You can also use Benadryl over the counter for itching or also to help with sleep.   MEDICATIONS See your medication  summary on the "After Visit Summary" that the nursing staff will review with you prior to discharge.  You may have some home medications which will be placed on hold until you complete the course of blood thinner medication.  It is important for you to complete the blood thinner medication as prescribed by your surgeon.  Continue your approved medications as instructed at time of discharge.  PRECAUTIONS . If you experience chest pain or shortness of breath - call 911 immediately for transfer to the hospital emergency department.  . If you develop a fever greater that 101 F, purulent drainage from wound, increased redness or drainage from wound, foul odor from the wound/dressing, or calf pain - CONTACT YOUR SURGEON.  FOLLOW-UP APPOINTMENTS Make sure you keep all of your appointments after your operation with your surgeon and caregivers. You should call the office at the above phone number and make an appointment for approximately two weeks after the date of your surgery or on the date instructed by your surgeon outlined in the "After Visit Summary".  RANGE OF MOTION AND STRENGTHENING EXERCISES  Rehabilitation of the knee is important following a knee injury or an operation. After just a few days of immobilization, the muscles of the thigh which control the knee become weakened and shrink (atrophy). Knee exercises are designed to build up the tone and strength of the thigh muscles and to improve knee motion. Often times heat used for twenty to thirty minutes before working out will loosen up your tissues and help with improving the range of motion but do not use heat for the first two weeks following surgery. These exercises can be done on a training (exercise) mat, on the floor, on a table or on a bed. Use what ever works the best and is most comfortable for you Knee exercises include:  . Leg Lifts - While your knee is still immobilized in a splint or cast,  you can do straight leg raises. Lift the leg to 60 degrees, hold for 3 sec, and slowly lower the leg. Repeat 10-20 times 2-3 times daily. Perform this exercise against resistance later as your knee gets better.  Isla Pence and Hamstring Sets - Tighten up the muscle on the front of the thigh (Quad) and hold for 5-10 sec. Repeat this 10-20 times hourly. Hamstring sets are done by pushing the foot backward against an object and holding for 5-10 sec. Repeat as with quad sets.   Leg Slides: Lying on your back, slowly slide your foot toward your buttocks, bending your knee up off the floor (only go as far as is comfortable). Then slowly slide your foot back down until your leg is flat on the floor again.  Angel Wings: Lying on your back spread your legs to the side as far apart as you can without causing discomfort.  A rehabilitation program following serious knee injuries can speed recovery and prevent re-injury in the future due to weakened muscles. Contact your doctor or a physical therapist for more information on knee rehabilitation.   IF YOU ARE TRANSFERRED TO A SKILLED REHAB FACILITY If the patient is transferred to a skilled rehab facility following release from the hospital, a list of the current medications will be sent to the facility for the patient to continue.  When discharged from the skilled rehab facility, please have the facility set up the patient's Home Health Physical Therapy prior to being released. Also, the skilled facility will be responsible for providing the patient with their medications at time of release from the facility to include their pain medication, the muscle relaxants, and their blood thinner medication. If the patient is still at the rehab facility at time of the two week follow up appointment, the skilled rehab facility will also need to assist the patient in arranging follow up appointment in our office and any transportation needs.  MAKE SURE YOU:  . Understand these  instructions.  . Get help right away if you are not doing well or get worse.    Pick up stool softner and laxative for home use following surgery while on pain medications. Do not submerge incision under water. Please use good hand washing techniques while changing dressing each day. May shower  starting three days after surgery. Please use a clean towel to pat the incision dry following showers. Continue to use ice for pain and swelling after surgery. Do not use any lotions or creams on the incision until instructed by your surgeon.

## 2020-12-07 NOTE — Anesthesia Procedure Notes (Signed)
Procedure Name: LMA Insertion Date/Time: 12/07/2020 3:11 PM Performed by: Wynonia Sours, CRNA Pre-anesthesia Checklist: Patient identified, Emergency Drugs available, Suction available, Patient being monitored and Timeout performed Patient Re-evaluated:Patient Re-evaluated prior to induction Oxygen Delivery Method: Circle system utilized Preoxygenation: Pre-oxygenation with 100% oxygen Induction Type: IV induction LMA: LMA with gastric port inserted LMA Size: 4.0 Number of attempts: 1 Placement Confirmation: positive ETCO2 and breath sounds checked- equal and bilateral Tube secured with: Tape Dental Injury: Teeth and Oropharynx as per pre-operative assessment

## 2020-12-07 NOTE — Brief Op Note (Signed)
12/07/2020  5:49 PM  PATIENT:  [name]  60 y.o. male  PRE-OPERATIVE DIAGNOSIS:  Right knee resection arthroplasty  POST-OPERATIVE DIAGNOSIS:  Right knee resection arthroplasty  PROCEDURE:  Procedure(s) with comments: REIMPLANTATION OF RIGHT TOTAL KNEE (Right) - 2 hrs  SURGEON:  Surgeon(s) and Role:    Ollen Gross, MD - Primary  PHYSICIAN ASSISTANT:   ASSISTANTS: Dennie Bible, PA=C   ANESTHESIA:   spinal and adductor canal block  EBL:  100 ml  BLOOD ADMINISTERED:none  DRAINS: (Medium) Hemovact drain(s) in the right knee with  Suction Open   LOCAL MEDICATIONS USED:  NONE  COUNTS:  YES  TOURNIQUET:   Total Tourniquet Time Documented: Thigh (Right) - 118 minutes Total: Thigh (Right) - 118 minutes   DICTATION: .Other Dictation: Dictation Number [id number]  PLAN OF CARE: Admit to inpatient   PATIENT DISPOSITION:  PACU - hemodynamically stable.

## 2020-12-07 NOTE — Transfer of Care (Signed)
Immediate Anesthesia Transfer of Care Note  Patient: [name]  Procedure(s) Performed: REIMPLANTATION OF RIGHT TOTAL KNEE (Right Knee)  Patient Location: PACU  Anesthesia Type:GA combined with regional for post-op pain  Level of Consciousness: drowsy  Airway & Oxygen Therapy: Patient Spontanous Breathing and Patient connected to nasal cannula oxygen  Post-op Assessment: Report given to RN and Post -op Vital signs reviewed and stable  Post vital signs: Reviewed and stable  Last Vitals:  Vitals Value Taken Time  BP 118/88 12/07/20 1817  Temp    Pulse 63 12/07/20 1823  Resp 31 12/07/20 1823  SpO2 94 % 12/07/20 1823  Vitals shown include unvalidated device data.  Last Pain:  Vitals:   12/07/20 1441  TempSrc:   PainSc: 0-No pain      Patients Stated Pain Goal: 3 (12/07/20 1255)  Complications: No complications documented.

## 2020-12-07 NOTE — Progress Notes (Signed)
Orthopedic Tech Progress Note Patient Details:  [name] 03-13-1961 [id number] Patient already has knee immobilizer on. Patient ID: [name], male   DOB: 08-20-1961, 60 y.o.   MRN: [id number]   Jennye Moccasin 12/07/2020, 8:39 PM

## 2020-12-08 ENCOUNTER — Other Ambulatory Visit: Payer: Self-pay

## 2020-12-08 LAB — CBC
HCT: 38.1 % — ABNORMAL LOW (ref 39.0–52.0)
Hemoglobin: 11.7 g/dL — ABNORMAL LOW (ref 13.0–17.0)
MCH: 27.2 pg (ref 26.0–34.0)
MCHC: 30.7 g/dL (ref 30.0–36.0)
MCV: 88.6 fL (ref 80.0–100.0)
Platelets: 304 10*3/uL (ref 150–400)
RBC: 4.3 MIL/uL (ref 4.22–5.81)
RDW: 15.7 % — ABNORMAL HIGH (ref 11.5–15.5)
WBC: 10.9 10*3/uL — ABNORMAL HIGH (ref 4.0–10.5)
nRBC: 0 % (ref 0.0–0.2)

## 2020-12-08 LAB — BASIC METABOLIC PANEL
Anion gap: 6 (ref 5–15)
BUN: 18 mg/dL (ref 6–20)
CO2: 25 mmol/L (ref 22–32)
Calcium: 8 mg/dL — ABNORMAL LOW (ref 8.9–10.3)
Chloride: 104 mmol/L (ref 98–111)
Creatinine, Ser: 1.16 mg/dL (ref 0.61–1.24)
GFR, Estimated: >60 mL/min (ref >60)
Glucose, Bld: 164 mg/dL — ABNORMAL HIGH (ref 70–99)
Potassium: 5.1 mmol/L (ref 3.5–5.1)
Sodium: 135 mmol/L (ref 135–145)

## 2020-12-08 NOTE — Evaluation (Signed)
Physical Therapy Evaluation Patient Details Name: [name] MRN: [id number] DOB: May 01, 1961 Today's Date: 12/08/2020   History of Present Illness  Pt s/p reimplantation of R TKR and with hx of B TKR and multiple revisions on R.  Pt also with hx of RA.  Clinical Impression  Pt s/p R TKR and presents with decreased R LE strength/ROM and post op pain limiting functional mobility.  Pt should progress to dc home with family assist.    Follow Up Recommendations Follow surgeon's recommendation for DC plan and follow-up therapies    Equipment Recommendations  None recommended by PT    Recommendations for Other Services       Precautions / Restrictions Precautions Precautions: Fall;Knee;Other (comment) Precaution Comments: Limit knee flex to 30-40 degrees for first week per PA note written 12/08/20 Restrictions Weight Bearing Restrictions: No Other Position/Activity Restrictions: WBAT      Mobility  Bed Mobility Overal bed mobility: Needs Assistance Bed Mobility: Supine to Sit;Sit to Supine     Supine to sit: HOB elevated;Min assist Sit to supine: Min assist   General bed mobility comments: use of bedrail and assist to manage R LE    Transfers Overall transfer level: Needs assistance Equipment used: Rolling walker (2 wheeled) Transfers: Sit to/from Stand Sit to Stand: Min guard         General transfer comment: steady assist  Ambulation/Gait Ambulation/Gait assistance: Min assist;Min guard Gait Distance (Feet): 38 Feet Assistive device: Rolling walker (2 wheeled) Gait Pattern/deviations: Step-to pattern;Decreased step length - right;Decreased step length - left;Shuffle;Decreased stance time - right;Antalgic;Trunk flexed Gait velocity: decr   General Gait Details: min cues for posture and position from [employer] Mobility    Modified Rankin (Stroke Patients Only)       Balance Overall balance assessment: Needs  assistance Sitting-balance support: No upper extremity supported;Feet supported Sitting balance-Leahy Scale: Good     Standing balance support: Bilateral upper extremity supported Standing balance-Leahy Scale: Poor                               Pertinent Vitals/Pain Pain Assessment: 0-10 Pain Score: 8  Pain Location: R knee Pain Descriptors / Indicators: Aching;Burning;Sore Pain Intervention(s): Limited activity within patient's tolerance;Monitored during session;Premedicated before session;Ice applied    Home Living Family/patient expects to be discharged to:: Private residence Living Arrangements: Spouse/significant other Available Help at Discharge: Family Type of Home: House Home Access: Ramped entrance     Home Layout: One level Home Equipment: Environmental consultant - 2 wheels;Wheelchair - manual;Bedside commode;Walker - 4 wheels;Shower seat - built in;Hand held shower head;Grab bars - tub/shower;Electric scooter      Prior Function Level of Independence: Independent with assistive device(s)         Comments: Pt using crutches in doors and scooter out of house     Hand Dominance   Dominant Hand: Right    Extremity/Trunk Assessment   Upper Extremity Assessment Upper Extremity Assessment: Overall WFL for tasks assessed    Lower Extremity Assessment Lower Extremity Assessment: RLE deficits/detail RLE Deficits / Details: 2/5 quads with AAROM at knee to 15 flex       Communication   Communication: No difficulties  Cognition Arousal/Alertness: Awake/alert Behavior During Therapy: WFL for tasks assessed/performed Overall Cognitive Status: Within Functional Limits for tasks assessed  General Comments      Exercises Total Joint Exercises Ankle Circles/Pumps: AROM;Both;15 reps;Supine Quad Sets: AROM;Both;10 reps;Supine Heel Slides: AAROM;Right;10 reps;Supine Straight Leg Raises: AAROM;Right;10  reps;Supine   Assessment/Plan    PT Assessment Patient needs continued PT services  PT Problem List Decreased strength;Decreased range of motion;Decreased activity tolerance;Decreased balance;Decreased mobility;Decreased knowledge of use of DME;Obesity;Pain       PT Treatment Interventions DME instruction;Gait training;Stair training;Functional mobility training;Therapeutic activities;Therapeutic exercise;Patient/family education    PT Goals (Current goals can be found in the Care Plan section)  Acute Rehab PT Goals Patient Stated Goal: REgain IND and have this be my last knee surgery PT Goal Formulation: With patient Time For Goal Achievement: 12/15/20 Potential to Achieve Goals: Good    Frequency 7X/week   Barriers to discharge        Co-evaluation               AM-PAC PT "6 Clicks" Mobility  Outcome Measure Help needed turning from your back to your side while in a flat bed without using bedrails?: A Little Help needed moving from lying on your back to sitting on the side of a flat bed without using bedrails?: A Little Help needed moving to and from a bed to a chair (including a wheelchair)?: A Little Help needed standing up from a chair using your arms (e.g., wheelchair or bedside chair)?: A Little Help needed to walk in hospital room?: A Little Help needed climbing 3-5 steps with a railing? : A Lot 6 Click Score: 17    End of Session Equipment Utilized During Treatment: Gait belt;Right knee immobilizer Activity Tolerance: Patient tolerated treatment well Patient left: in bed;with call bell/phone within reach Nurse Communication: Mobility status PT Visit Diagnosis: Difficulty in walking, not elsewhere classified (R26.2);Pain Pain - Right/Left: Right Pain - part of body: Knee    Time: 8299-3716 PT Time Calculation (min) (ACUTE ONLY): 37 min   Charges:   PT Evaluation $PT Eval Low Complexity: 1 Low PT Treatments $Therapeutic Exercise: 8-22 mins         Mauro Kaufmann PT Acute Rehabilitation Services Pager [phone] Office [phone]   BRADSHAW,HUNTER 12/08/2020, 8:52 AM

## 2020-12-08 NOTE — Progress Notes (Signed)
Subjective: 1 Day Post-Op Procedure(s) (LRB): REIMPLANTATION OF RIGHT TOTAL KNEE (Right) Patient reports pain as mild.   Patient seen in rounds with Dr. Lequita Halt. Patient is well, and has had no acute complaints or problems. Pain well controlled in the knee currently, no issues overnight. Denies chest pain, SOB, or calf pain.  We will begin therapy today.   Objective: Vital signs in last 24 hours: Temp:  [97.6 F (36.4 C)-98.9 F (37.2 C)] 97.8 F (36.6 C) (02/24 0342) Pulse Rate:  [50-74] 74 (02/24 0342) Resp:  [10-26] 18 (02/24 0342) BP: (112-161)/(63-96) 126/80 (02/24 0342) SpO2:  [92 %-100 %] 97 % (02/24 0713) Weight:  [146.5 kg-154.1 kg] 154.1 kg (02/23 2000)  Intake/Output from previous day:  Intake/Output Summary (Last 24 hours) at 12/08/2020 0753 Last data filed at 12/08/2020 0719 Gross per 24 hour  Intake 1741.8 ml  Output 1270 ml  Net 471.8 ml     Intake/Output this shift: Total I/O In: 0  Out: 300 [Urine:300]  Labs: Recent Labs    12/06/20 1110 12/08/20 0312  HGB 13.5 11.7*   Recent Labs    12/06/20 1110 12/08/20 0312  WBC 8.0 10.9*  RBC 4.90 4.30  HCT 43.1 38.1*  PLT 317 304   Recent Labs    12/06/20 1110 12/08/20 0312  NA 138 135  K 4.3 5.1  CL 103 104  CO2 26 25  BUN 18 18  CREATININE 0.87 1.16  GLUCOSE 99 164*  CALCIUM 8.7* 8.0*   Recent Labs    12/06/20 1110  INR 1.1    Exam: General - Patient is Alert and Oriented Extremity - Neurologically intact Neurovascular intact Sensation intact distally Dorsiflexion/Plantar flexion intact Dressing - dressing C/D/I Motor Function - intact, moving foot and toes well on exam.   Past Medical History:  Diagnosis Date  . Allergy    OCC   . Anemia    hx of  . Asthma   . Cataract    small- forming   . Cellulitis of lower extremity    "usually RLL; this time it's got up to my lower abdoment" (02/11/2014)-series of antibiotics completed 02-28-14  . Diverticulosis   . Edema     legs  . GERD (gastroesophageal reflux disease)   . Hypertension   . Infection of right knee (HCC)   . OSA (obstructive sleep apnea)    does not use cpap   . Pneumonia    20+ years ago  . PONV (postoperative nausea and vomiting)   . Rheumatoid arthritis (HCC) dx'd ~ 1977  . S/P PICC central line placement 03-16-14   right upper arm remains intact.05-11-14 removed 1 week ago.  . Sleep apnea    no cpap     Assessment/Plan: 1 Day Post-Op Procedure(s) (LRB): REIMPLANTATION OF RIGHT TOTAL KNEE (Right) Active Problems:   History of removal of joint prosthesis of right knee due to infection  Estimated body mass index is 51.66 kg/m as calculated from the following:   Height as of this encounter: 5\' 8"  (1.727 m).   Weight as of this encounter: 154.1 kg. Advance diet Up with therapy  DVT Prophylaxis - Aspirin Weight bearing as tolerated. Begin therapy.  Hemovac drain left in place, will pull tomorrow AM. Limit bending of the knee, no more than 30-40 degrees the first week due to soft tissue releases lateral to the patella. After one week, may begin further flexion as tolerated.   Plan is to go Home after hospital stay. Plan  for discharge tomorrow if meeting goals and pain controlled.   Arther Abbott, PA-C Orthopedic Surgery [phone] 12/08/2020, 7:53 AM

## 2020-12-08 NOTE — Progress Notes (Signed)
Physical Therapy Treatment Patient Details Name: [name] MRN: [id number] DOB: Sep 28, 1961 Today's Date: 12/08/2020    History of Present Illness Pt s/p reimplantation of R TKR and with hx of B TKR and multiple revisions on R.  Pt also with hx of RA.    PT Comments    Pt very cooperative and up to ambulate increased distance this pm with noted improvement in stability.   Follow Up Recommendations  Follow surgeon's recommendation for DC plan and follow-up therapies     Equipment Recommendations  None recommended by PT    Recommendations for Other Services       Precautions / Restrictions Precautions Precautions: Fall;Knee;Other (comment) Precaution Comments: Limit knee flex to 30-40 degrees for first week per PA note written 12/08/20 Restrictions Weight Bearing Restrictions: No Other Position/Activity Restrictions: WBAT    Mobility  Bed Mobility Overal bed mobility: Needs Assistance Bed Mobility: Supine to Sit;Sit to Supine     Supine to sit: HOB elevated;Min assist Sit to supine: Min assist   General bed mobility comments: use of bedrail and assist to manage R LE    Transfers Overall transfer level: Needs assistance Equipment used: Rolling walker (2 wheeled) Transfers: Sit to/from Stand Sit to Stand: Min guard         General transfer comment: steady assist  Ambulation/Gait Ambulation/Gait assistance: Min guard Gait Distance (Feet): 75 Feet Assistive device: Rolling walker (2 wheeled) Gait Pattern/deviations: Step-to pattern;Decreased step length - right;Decreased step length - left;Shuffle;Decreased stance time - right;Antalgic;Trunk flexed Gait velocity: decr   General Gait Details: min cues for posture and position from [employer] Mobility    Modified Rankin (Stroke Patients Only)       Balance Overall balance assessment: Needs assistance Sitting-balance support: No upper extremity supported;Feet  supported Sitting balance-Leahy Scale: Good     Standing balance support: Bilateral upper extremity supported Standing balance-Leahy Scale: Fair                              Cognition Arousal/Alertness: Awake/alert Behavior During Therapy: WFL for tasks assessed/performed Overall Cognitive Status: Within Functional Limits for tasks assessed                                        Exercises      General Comments        Pertinent Vitals/Pain Pain Assessment: 0-10 Pain Score: 7  Pain Location: R knee Pain Descriptors / Indicators: Aching;Burning;Sore Pain Intervention(s): Limited activity within patient's tolerance;Monitored during session;Premedicated before session;Ice applied    Home Living                      Prior Function            PT Goals (current goals can now be found in the care plan section) Acute Rehab PT Goals Patient Stated Goal: REgain IND and have this be my last knee surgery PT Goal Formulation: With patient Time For Goal Achievement: 12/15/20 Potential to Achieve Goals: Good Progress towards PT goals: Progressing toward goals    Frequency    7X/week      PT Plan Current plan remains appropriate    Co-evaluation  AM-PAC PT "6 Clicks" Mobility   Outcome Measure  Help needed turning from your back to your side while in a flat bed without using bedrails?: A Little Help needed moving from lying on your back to sitting on the side of a flat bed without using bedrails?: A Little Help needed moving to and from a bed to a chair (including a wheelchair)?: A Little Help needed standing up from a chair using your arms (e.g., wheelchair or bedside chair)?: A Little Help needed to walk in hospital room?: A Little Help needed climbing 3-5 steps with a railing? : A Lot 6 Click Score: 17    End of Session Equipment Utilized During Treatment: Gait belt;Right knee immobilizer Activity Tolerance:  Patient tolerated treatment well Patient left: in bed;with call bell/phone within reach;with family/visitor present;with nursing/sitter in room Nurse Communication: Mobility status PT Visit Diagnosis: Difficulty in walking, not elsewhere classified (R26.2);Pain Pain - Right/Left: Right Pain - part of body: Knee     Time: 7353-2992 PT Time Calculation (min) (ACUTE ONLY): 15 min  Charges:  $Gait Training: 8-22 mins                     Mauro Kaufmann PT Acute Rehabilitation Services Pager [phone] Office [phone]    [facility] 12/08/2020, 3:44 PM

## 2020-12-08 NOTE — Progress Notes (Signed)
Orthopedic Tech Progress Note Patient Details:  [name] 1961/01/22 [id number]  Ortho Devices Ortho Device/Splint Location: Trapeze bar Ortho Device/Splint Interventions: Application   Post Interventions Patient Tolerated: Well Instructions Provided: Care of device   [name] 12/08/2020, 3:30 PM

## 2020-12-08 NOTE — Op Note (Unsigned)
[name], [name] MEDICAL RECORD NO: [id number] ACCOUNT NO: [id number] DATE OF BIRTH: [birth year] FACILITY: [facility]ATION: [id number] PHYSICIAN: Gus Rankin. 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  PROCEDURE:  Reimplantation of right total knee arthroplasty.  SURGEON:  Gus Rankin. Aluisio, MD  ASSISTANT:  Dennie Bible, PA-C  ANESTHESIA:  Spinal.  ESTIMATED BLOOD LOSS:  100 mL drain Hemovac x1.  COMPLICATIONS:  None.  CONDITION:  Stable to recovery.  BRIEF CLINICAL NOTE:  The patient is a 60 year old male with a long complex history with regards to his right knee.  He had a resection arthroplasty and placement of an antibiotic spacer approximately 4 months ago and has shown resolution of all  infectious symptoms.  Infectious Disease has evaluated him and taken off antibiotics.  He did not have any flare up of infectious symptoms coming off the antibiotics.  Aspiration has been negative and his labs have improved tremendously.  He presents now  for reimplantation of right total knee arthroplasty.  PROCEDURE IN DETAIL:  After successful administration of anesthetic, tourniquet was placed high on the right thigh.  Right lower extremity prepped and draped in the usual sterile fashion.  The extremity was wrapped with an Esmarch, tourniquet inflated to  300 mmHg.  Midline incision was made with a 10 blade through subcutaneous tissue to the extensor mechanism.  He had a history of subcutaneous effusions and there was a bursa present subcutaneously and that was excised back to normal-appearing tissue.   There was no abnormal fluid or tissue present with the exception of that bursa.  Fresh blade was then used to make a medial parapatellar arthrotomy.  A small amount of hematoma in the joint and that was sent for  stat Gram stain, which came back negative.   I also sent synovial tissue for culture and the Gram stain for that also came back negative.  He had dense scarring and I removed the scar and re-created the medial and lateral gutters, soft tissue of the proximal medial tibia was also elevated into  the semimembranosus bursa.  Soft tissue laterally was elevated, but the patella still would not slide lateral, so I had to do a quadriceps snip to move the patella laterally.  He had a static spacer in place with Steinmann pins through the center to  serve as "rebar."  This knee was essentially fused in extension with the spacer.  I had to chip the cement away from the pins and then I was able to remove them.  I was unable to flex the knee up to 90 degrees.  Tibial preparation was initiated.  He had a significant amount of tibial bone loss to the tubercle anteriorly.  Extensor mechanism is intact.  He had cavitary contained defect proximally.  I felt that it was going to be necessary to place a porous coated sleeve to fill this defect.  We  first prepared the tibia by removing any fibrous tissue and reaming the canal up to 13 mm.  This also removed any abnormal tissue from the canal.  I placed there a size 4 cement restrictor to the appropriate depth in the tibial canal as I was going to  cement the stem and have everything proximally be porous coated press fit.  The sleeve was then prepared up to a size 61, which had fantastic fill of  the metaphyseal region of the tibia.  Given the amount of bone loss, I had to do a build-up platform  tibial component, which was the 25 mm thick, which is a size 2 down at the base and goes up to a size 4 at the articular surface.  This restored the height of the tibia back to its appropriate level.  We then addressed the femur.  Given his significant bone loss, I needed to place a metaphyseal sleeve in the femur and wanted to use a hinged component to ensure stability in the  varus-valgus plane.  We first reamed the femoral canal up to 20 mm, which  had an excellent press fit.  I reamed for a 110 mm stem.  We then prepared the sleeve, which was a second size sleeve, which was a 42 and it had a fantastic fit in the metaphyseal region of the femur.  Size medium body was most appropriate for the distal  femoral portion of the component.  This was a Noiles hinge with an MBT revision tray.  The cuts were then made, especially posterior chamfer cut.  Distally, we did not need to take any bone.  The trial femur was then placed, which was a 42 sleeve with  the medium body of the femur in about 5 degrees of external rotation and a 20 x 110 stem.  This had very stable fit on the femur.  On the tibial side, we had the MBT revision tray, which was a 13 x 30 stem with a 61 mm sleeve and a buildup tray with a  size 4 proximal and this was a 25 mm buildup.  We placed a 21 mm trial.  Full extension was achieved with excellent balance through full range of motion.  We then removed the trials, thoroughly irrigated the knee with 3 liters of saline using pulsatile  lavage.  Two batches of gentamicin impregnated cement were mixed and then the tibial component was placed cementing distally, but the sleeve was porous coated.  This had a very stable fit.  On the femoral side, we then press fit everything and the sleeve  was porous coated.  Excellent fit on the femoral side also.  I placed a 21 trial, held the knee in full extension.  All extruded cement was removed.  When the cement had fully hardened, then a permanent 21 mm rotating platform hinged insert was placed.   We then placed the pin through the femoral component and through the insert to lock this in place to serve as the hinge.  It was locked into place.  Excellent range of motion was found with excellent stability throughout.  I needed to perform a lateral  release to get the patella to track normally.  We then thoroughly irrigated the knee  further with the saline and pulsatile lavage.  The arthrotomy was closed with running 0 Stratafix suture as well as interrupted #1 PDS suture.  A quadriceps snip was  also fixed with a PDS.  I was able to flex his knee to 90 degrees against gravity.  The patella tracked normally in this range.  We then released the tourniquet, total time of 118 minutes.  Minor bleeding was addressed and stopped with cautery.  Further  irrigation was performed.  A Hemovac drain was placed and then the subcutaneous was closed with interrupted 2-0 Vicryl and skin with staples.  Drain was hooked to suction.  Incision was cleaned and dried and a sterile dressing applied.  He was awakened  and transported to recovery in stable condition.  Note that a surgical assistant was of medical necessity for this very complex procedure.  Assistant was necessary for retraction of vital ligaments and protection of neurovascular structures and for removal of the spacer and for accurate placement of the  new implant.     PAA D: 12/07/2020 5:59:33 pm T: 12/08/2020 6:38:00 am  JOB: [id number]/ [id number]

## 2020-12-09 ENCOUNTER — Encounter (HOSPITAL_COMMUNITY): Payer: Self-pay | Admitting: Orthopedic Surgery

## 2020-12-09 LAB — BASIC METABOLIC PANEL
Anion gap: 6 (ref 5–15)
BUN: 18 mg/dL (ref 6–20)
CO2: 26 mmol/L (ref 22–32)
Calcium: 8.6 mg/dL — ABNORMAL LOW (ref 8.9–10.3)
Chloride: 106 mmol/L (ref 98–111)
Creatinine, Ser: 0.95 mg/dL (ref 0.61–1.24)
GFR, Estimated: >60 mL/min (ref >60)
Glucose, Bld: 152 mg/dL — ABNORMAL HIGH (ref 70–99)
Potassium: 5.3 mmol/L — ABNORMAL HIGH (ref 3.5–5.1)
Sodium: 138 mmol/L (ref 135–145)

## 2020-12-09 LAB — CBC
HCT: 36.5 % — ABNORMAL LOW (ref 39.0–52.0)
Hemoglobin: 11.1 g/dL — ABNORMAL LOW (ref 13.0–17.0)
MCH: 27.5 pg (ref 26.0–34.0)
MCHC: 30.4 g/dL (ref 30.0–36.0)
MCV: 90.3 fL (ref 80.0–100.0)
Platelets: 318 10*3/uL (ref 150–400)
RBC: 4.04 MIL/uL — ABNORMAL LOW (ref 4.22–5.81)
RDW: 15.8 % — ABNORMAL HIGH (ref 11.5–15.5)
WBC: 11.7 10*3/uL — ABNORMAL HIGH (ref 4.0–10.5)
nRBC: 0 % (ref 0.0–0.2)

## 2020-12-09 MED ORDER — ASPIRIN 325 MG PO TBEC: 325.0000 mg | DELAYED_RELEASE_TABLET | Freq: Two times a day (BID) | ORAL | 0 refills | Status: DC

## 2020-12-09 MED ORDER — TRAMADOL HCL 50 MG PO TABS: 50.0000 mg | ORAL_TABLET | Freq: Four times a day (QID) | ORAL | 0 refills | Status: AC | PRN

## 2020-12-09 MED ORDER — OXYCODONE HCL 5 MG PO TABS: 5.0000 mg | ORAL_TABLET | Freq: Four times a day (QID) | ORAL | 0 refills | Status: DC | PRN

## 2020-12-09 MED ORDER — METHOCARBAMOL 500 MG PO TABS: 500.0000 mg | ORAL_TABLET | Freq: Four times a day (QID) | ORAL | 0 refills | Status: DC | PRN

## 2020-12-09 NOTE — Progress Notes (Signed)
Physical Therapy Treatment Patient Details Name: [name] MRN: [id number] DOB: 1960-12-05 Today's Date: 12/09/2020    History of Present Illness Pt s/p reimplantation of R TKR and with hx of B TKR and multiple revisions on R.  Pt also with hx of RA.    PT Comments    Pt motivated and progressing well with mobility.  Pt up to ambulate increased distance in hall and performed HEP with written instruction provided and reviewed.   Follow Up Recommendations  Follow surgeon's recommendation for DC plan and follow-up therapies     Equipment Recommendations  None recommended by PT    Recommendations for Other Services       Precautions / Restrictions Precautions Precautions: Fall;Knee;Other (comment) Precaution Comments: Limit knee flex to 30-40 degrees for first week per PA note written 12/08/20 Restrictions Weight Bearing Restrictions: No Other Position/Activity Restrictions: WBAT    Mobility  Bed Mobility Overal bed mobility: Needs Assistance Bed Mobility: Supine to Sit;Sit to Supine     Supine to sit: Min guard Sit to supine: Min assist   General bed mobility comments: use of bedrail and assist to manage R LE    Transfers Overall transfer level: Needs assistance Equipment used: Rolling walker (2 wheeled) Transfers: Sit to/from Stand Sit to Stand: Supervision         General transfer comment: cues for use of UEs to self assist  Ambulation/Gait Ambulation/Gait assistance: Min guard;Supervision Gait Distance (Feet): 120 Feet Assistive device: Rolling walker (2 wheeled) Gait Pattern/deviations: Decreased step length - right;Decreased step length - left;Shuffle;Decreased stance time - right;Antalgic;Trunk flexed;Step-to pattern;Step-through pattern Gait velocity: decr   General Gait Details: min cues for posture and position from RW; several standing rest breaks 2* fatigue   Stairs             Wheelchair Mobility    Modified Rankin (Stroke Patients  Only)       Balance Overall balance assessment: Needs assistance Sitting-balance support: No upper extremity supported;Feet supported Sitting balance-Leahy Scale: Good     Standing balance support: Bilateral upper extremity supported Standing balance-Leahy Scale: Fair                              Cognition Arousal/Alertness: Awake/alert Behavior During Therapy: WFL for tasks assessed/performed Overall Cognitive Status: Within Functional Limits for tasks assessed                                        Exercises Total Joint Exercises Ankle Circles/Pumps: AROM;Both;15 reps;Supine Quad Sets: AROM;Both;10 reps;Supine Heel Slides: AAROM;Right;Supine;15 reps Hip ABduction/ADduction: AAROM;Right;10 reps;Supine Straight Leg Raises: AAROM;Right;Supine;20 reps Goniometric ROM: R knee AAROM -5 - 20    General Comments        Pertinent Vitals/Pain Pain Assessment: 0-10 Pain Score: 7  Pain Location: R knee Pain Descriptors / Indicators: Aching;Burning;Sore Pain Intervention(s): Limited activity within patient's tolerance;Monitored during session;Premedicated before session;Ice applied    Home Living                      Prior Function            PT Goals (current goals can now be found in the care plan section) Acute Rehab PT Goals Patient Stated Goal: REgain IND and have this be my last knee surgery PT Goal Formulation: With patient Time For  Goal Achievement: 12/15/20 Potential to Achieve Goals: Good Progress towards PT goals: Progressing toward goals    Frequency    7X/week      PT Plan Current plan remains appropriate    Co-evaluation              AM-PAC PT "6 Clicks" Mobility   Outcome Measure  Help needed turning from your back to your side while in a flat bed without using bedrails?: A Little Help needed moving from lying on your back to sitting on the side of a flat bed without using bedrails?: A Little Help  needed moving to and from a bed to a chair (including a wheelchair)?: A Little Help needed standing up from a chair using your arms (e.g., wheelchair or bedside chair)?: A Little Help needed to walk in hospital room?: A Little Help needed climbing 3-5 steps with a railing? : A Lot 6 Click Score: 17    End of Session Equipment Utilized During Treatment: Gait belt;Right knee immobilizer Activity Tolerance: Patient tolerated treatment well Patient left: in bed;with call bell/phone within reach;with nursing/sitter in room Nurse Communication: Mobility status PT Visit Diagnosis: Difficulty in walking, not elsewhere classified (R26.2);Pain Pain - Right/Left: Right Pain - part of body: Knee     Time: 9924-2683 PT Time Calculation (min) (ACUTE ONLY): 47 min  Charges:  $Gait Training: 8-22 mins $Therapeutic Exercise: 8-22 mins $Therapeutic Activity: 8-22 mins                     Mauro Kaufmann PT Acute Rehabilitation Services Pager [phone] Office [phone]    Dillan Lunden 12/09/2020, 11:23 AM

## 2020-12-09 NOTE — TOC Transition Note (Signed)
Transition of Care [facility]) - CM/SW Discharge Note   Patient Details  Name: [name] MRN: [id number] Date of Birth: 03-Jan-1961  [facility] CM/SW Contact:  Lennart Pall, LCSW Phone Number: 12/09/2020, 2:56 PM   Clinical Narrative:    Met several times with pt and wife today to discuss [place] arrangements.  At time of pt discharge I had been unable to secure any HH who would accept due to staffing or insurance issues.  Will continue to work on this and follow up with pt/ spouse end of day to encourage they contact MD office about OP if unable to secure.    Final next level of care: [place] Barriers to Discharge: No [place] will accept this patient   Patient Goals and CMS Choice Patient states their goals for this hospitalization and ongoing recovery are:: go home      Discharge Placement                       Discharge Plan and Services                DME Arranged: N/A DME Agency: NA                  Social Determinants of Health (SDOH) Interventions     Readmission Risk Interventions No flowsheet data found.

## 2020-12-09 NOTE — Progress Notes (Signed)
Subjective: 2 Days Post-Op Procedure(s) (LRB): REIMPLANTATION OF RIGHT TOTAL KNEE (Right) Patient reports pain as mild.   Patient seen in rounds by Dr. Lequita Halt. Patient is well, and has had no acute complaints or problems. No acute overnight events. Denies chest pain, SOB, or calf pain.  Plan is to go Home after hospital stay.  Objective: Vital signs in last 24 hours: Temp:  [97.6 F (36.4 C)-99.7 F (37.6 C)] 97.7 F (36.5 C) (02/25 0606) Pulse Rate:  [54-72] 54 (02/25 0606) Resp:  [15-19] 19 (02/25 0606) BP: (123-136)/(66-70) 124/67 (02/25 0606) SpO2:  [95 %-98 %] 98 % (02/25 0606)  Intake/Output from previous day:  Intake/Output Summary (Last 24 hours) at 12/09/2020 0731 Last data filed at 12/09/2020 0608 Gross per 24 hour  Intake 1137.03 ml  Output 1575 ml  Net -437.97 ml    Intake/Output this shift: No intake/output data recorded.  Labs: Recent Labs    12/06/20 1110 12/08/20 0312 12/09/20 0313  HGB 13.5 11.7* 11.1*   Recent Labs    12/08/20 0312 12/09/20 0313  WBC 10.9* 11.7*  RBC 4.30 4.04*  HCT 38.1* 36.5*  PLT 304 318   Recent Labs    12/08/20 0312 12/09/20 0313  NA 135 138  K 5.1 5.3*  CL 104 106  CO2 25 26  BUN 18 18  CREATININE 1.16 0.95  GLUCOSE 164* 152*  CALCIUM 8.0* 8.6*   Recent Labs    12/06/20 1110  INR 1.1    Exam: General - Patient is Alert and Oriented Extremity - Neurologically intact Neurovascular intact Sensation intact distally Dorsiflexion/Plantar flexion intact Dressing/Incision - clean, dry, no drainage Motor Function - intact, moving foot and toes well on exam.   Past Medical History:  Diagnosis Date  . Allergy    OCC   . Anemia    hx of  . Asthma   . Cataract    small- forming   . Cellulitis of lower extremity    "usually RLL; this time it's got up to my lower abdoment" (02/11/2014)-series of antibiotics completed 02-28-14  . Diverticulosis   . Edema    legs  . GERD (gastroesophageal reflux  disease)   . Hypertension   . Infection of right knee (HCC)   . OSA (obstructive sleep apnea)    does not use cpap   . Pneumonia    20+ years ago  . PONV (postoperative nausea and vomiting)   . Rheumatoid arthritis (HCC) dx'd ~ 1977  . S/P PICC central line placement 03-16-14   right upper arm remains intact.05-11-14 removed 1 week ago.  . Sleep apnea    no cpap     Assessment/Plan: 2 Days Post-Op Procedure(s) (LRB): REIMPLANTATION OF RIGHT TOTAL KNEE (Right) Active Problems:   History of removal of joint prosthesis of right knee due to infection  Estimated body mass index is 51.66 kg/m as calculated from the following:   Height as of this encounter: 5\' 8"  (1.727 m).   Weight as of this encounter: 154.1 kg. Up with therapy  DVT Prophylaxis - Aspirin Weight-bearing as tolerated  Continue physical therapy for one session today, and if meeting goals, will plan for discharge.  Limit bending of the knee, no more than 30-40 degrees the first week due to soft tissues releases lateral to the patella.   Plan for evaluation and treatment by Home Health PT upon discharge.  The PDMP database was reviewed today prior to any opioid medications being prescribed to this patient.  Nelia Shi, MBA, PA-C Orthopedic Surgery 12/09/2020, 7:31 AM

## 2020-12-09 NOTE — Plan of Care (Signed)
Instructions were reviewed with patient. All questions were answered. Patient was transported to main entrance by wheelchair. ° °

## 2020-12-13 LAB — AEROBIC/ANAEROBIC CULTURE W GRAM STAIN (SURGICAL/DEEP WOUND)
Culture: NO GROWTH
Culture: NO GROWTH
Gram Stain: NONE SEEN

## 2020-12-15 ENCOUNTER — Ambulatory Visit: Payer: 59 | Admitting: Infectious Diseases

## 2020-12-15 NOTE — Discharge Summary (Signed)
Physician Discharge Summary   Patient ID: [name] MRN: [id number] DOB/AGE: [birth year] 60 y.o.  Admit date: 12/07/2020 Discharge date: 12/09/2020  Primary Diagnosis: s/p right total knee arthroplasty resection  Admission Diagnoses:  Past Medical History:  Diagnosis Date  . Allergy    OCC   . Anemia    hx of  . Asthma   . Cataract    small- forming   . Cellulitis of lower extremity    "usually RLL; this time it's got up to my lower abdoment" (02/11/2014)-series of antibiotics completed 02-28-14  . Diverticulosis   . Edema    legs  . GERD (gastroesophageal reflux disease)   . Hypertension   . Infection of right knee (HCC)   . OSA (obstructive sleep apnea)    does not use cpap   . Pneumonia    20+ years ago  . PONV (postoperative nausea and vomiting)   . Rheumatoid arthritis (HCC) dx'd ~ 1977  . S/P PICC central line placement 03-16-14   right upper arm remains intact.05-11-14 removed 1 week ago.  . 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   Weight as of this encounter: 154.1 kg.  Procedure:  Procedure(s) (LRB): REIMPLANTATION OF RIGHT TOTAL KNEE (Right)   Consults: None  HPI: The patient is a 60 year old male with a long complex history with regards to his right knee.  He had a resection arthroplasty and placement of an antibiotic spacer approximately 4 months ago and has shown resolution of all infectious symptoms. Infectious Disease has evaluated him and taken off antibiotics.  He did not have any flare up of infectious symptoms coming off the antibiotics.  Aspiration has been negative and his labs have improved tremendously.  He presents now for reimplantation of right total knee arthroplasty.  Laboratory Data: Admission on 12/07/2020,  Discharged on 12/09/2020  Component Date Value Ref Range Status  . Specimen Description 12/07/2020    Final                   Value:SYNOVIAL FLUID RIGHT KNEE DEEP Performed at [facility], [address], [place], [place] [address]   . Special Requests 12/07/2020    Final                   Value:NONE Performed at [facility], [address], [place], [place] [address]   . Gram Stain 12/07/2020    Final                   Value:NO WBC SEEN NO ORGANISMS SEEN Gram Stain Report Called to,Read Back By and Verified With: S.ROVEK AT 1717 ON 02.23.22 BY N.THOMPSON Performed at [facility], [address], [place], [place] [address]   . Culture 12/07/2020    Final                   Value:No growth aerobically or anaerobically. Performed at [facility], [address], [place], [place] [address]   . Report Status 12/07/2020 12/13/2020 FINAL   Final  . Specimen Description 12/07/2020    Final  Value:TISSUE RIGHT KNEE SYNOVIUM Performed at [facility], [address], [place], [place] [address]   . Special Requests 12/07/2020    Final                   Value:NONE Performed at [facility], [address], [place], [place] [address]   . Gram Stain 12/07/2020    Final                   Value:RARE WBC PRESENT,BOTH PMN AND MONONUCLEAR NO ORGANISMS SEEN Gram Stain Report Called to,Read Back By and Verified With: S.ROVEK AT 1717 ON 02.23.22 BY N.THOMPSON Performed at [facility], [address], [place], [place] [address]   . Culture 12/07/2020    Final                   Value:No growth aerobically or anaerobically. Performed at [facility], [address] [place], [place] [address]   . Report Status 12/07/2020 12/13/2020 FINAL   Final  . WBC 12/08/2020 10.9* 4.0 - 10.5 K/uL Final  . RBC 12/08/2020 4.30  4.22 - 5.81 MIL/uL Final  . Hemoglobin  12/08/2020 11.7* 13.0 - 17.0 g/dL Final  . HCT 29/56/2130 38.1* 39.0 - 52.0 % Final  . MCV 12/08/2020 88.6  80.0 - 100.0 fL Final  . MCH 12/08/2020 27.2  26.0 - 34.0 pg Final  . MCHC 12/08/2020 30.7  30.0 - 36.0 g/dL Final  . RDW 86/57/8469 15.7* 11.5 - 15.5 % Final  . Platelets 12/08/2020 304  150 - 400 K/uL Final  . nRBC 12/08/2020 0.0  0.0 - 0.2 % Final   Performed at [facility], [address] [place], [place] [address]  . Sodium 12/08/2020 135  135 - 145 mmol/L Final  . Potassium 12/08/2020 5.1  3.5 - 5.1 mmol/L Final  . Chloride 12/08/2020 104  98 - 111 mmol/L Final  . CO2 12/08/2020 25  22 - 32 mmol/L Final  . Glucose, Bld 12/08/2020 164* 70 - 99 mg/dL Final   Glucose reference range applies only to samples taken after fasting for at least 8 hours.  . BUN 12/08/2020 18  6 - 20 mg/dL Final  . Creatinine, Ser 12/08/2020 1.16  0.61 - 1.24 mg/dL Final  . Calcium 84/13/2440 8.0* 8.9 - 10.3 mg/dL Final  . GFR, Estimated 12/08/2020 >60  >60 mL/min Final   Comment: (NOTE) Calculated using the CKD-EPI Creatinine Equation (2021)   . Anion gap 12/08/2020 6  5 - 15 Final   Performed at [facility], [address] [place], [place] [address]  . WBC 12/09/2020 11.7* 4.0 - 10.5 K/uL Final  . RBC 12/09/2020 4.04* 4.22 - 5.81 MIL/uL Final  . Hemoglobin 12/09/2020 11.1* 13.0 - 17.0 g/dL Final  . HCT 53/66/4403 36.5* 39.0 - 52.0 % Final  . MCV 12/09/2020 90.3  80.0 - 100.0 fL Final  . MCH 12/09/2020 27.5  26.0 - 34.0 pg Final  . MCHC 12/09/2020 30.4  30.0 - 36.0 g/dL Final  . RDW 47/42/5956 15.8* 11.5 - 15.5 % Final  . Platelets 12/09/2020 318  150 - 400 K/uL Final  . nRBC 12/09/2020 0.0  0.0 - 0.2 % Final   Performed at [facility], [address], [place], [place] [address]  . Sodium 12/09/2020 138  135 - 145 mmol/L Final  . Potassium 12/09/2020 5.3* 3.5 - 5.1 mmol/L Final  .  Chloride 12/09/2020 106  98 - 111 mmol/L Final  .  CO2 12/09/2020 26  22 - 32 mmol/L Final  . Glucose, Bld 12/09/2020 152* 70 - 99 mg/dL Final   Glucose reference range applies only to samples taken after fasting for at least 8 hours.  . BUN 12/09/2020 18  6 - 20 mg/dL Final  . Creatinine, Ser 12/09/2020 0.95  0.61 - 1.24 mg/dL Final  . Calcium 16/07/9603 8.6* 8.9 - 10.3 mg/dL Final  . GFR, Estimated 12/09/2020 >60  >60 mL/min Final   Comment: (NOTE) Calculated using the CKD-EPI Creatinine Equation (2021)   . Anion gap 12/09/2020 6  5 - 15 Final   Performed at [facility], [address], [place], [place] [address]  Hospital Outpatient Visit on 12/06/2020  Component Date Value Ref Range Status  . SARS Coronavirus 2 12/06/2020 NEGATIVE  NEGATIVE Final   Comment: (NOTE) SARS-CoV-2 target nucleic acids are NOT DETECTED.  The SARS-CoV-2 RNA is generally detectable in upper and lower respiratory specimens during the acute phase of infection. Negative results do not preclude SARS-CoV-2 infection, do not rule out co-infections with other pathogens, and should not be used as the sole basis for treatment or other patient management decisions. Negative results must be combined with clinical observations, patient history, and epidemiological information. The expected result is Negative.  Fact Sheet for Patients: HairSlick.no  Fact Sheet for Healthcare Providers: quierodirigir.com  This test is not yet approved or cleared by the [place] FDA and  has been authorized for detection and/or diagnosis of SARS-CoV-2 by FDA under an Emergency Use Authorization (EUA). This EUA will remain  in effect (meaning this test can be used) for the duration of the COVID-19 declaration under Se                          ction 564(b)(1) of the Act, 21 U.S.C. section 360bbb-3(b)(1), unless the authorization is terminated or revoked sooner.  Performed at [facility],  [address], [place], [place] [address]   Hospital Outpatient Visit on 12/06/2020  Component Date Value Ref Range Status  . MRSA, PCR 12/06/2020 NEGATIVE  NEGATIVE Final  . Staphylococcus aureus 12/06/2020 NEGATIVE  NEGATIVE Final   Comment: (NOTE) The Xpert SA Assay (FDA approved for NASAL specimens in patients 42 years of age and older), is one component of a comprehensive surveillance program. It is not intended to diagnose infection nor to guide or monitor treatment. Performed at [facility], [address], [place], [place] [address]   . WBC 12/06/2020 8.0  4.0 - 10.5 K/uL Final  . RBC 12/06/2020 4.90  4.22 - 5.81 MIL/uL Final  . Hemoglobin 12/06/2020 13.5  13.0 - 17.0 g/dL Final  . HCT 62/13/0865 43.1  39.0 - 52.0 % Final  . MCV 12/06/2020 88.0  80.0 - 100.0 fL Final  . MCH 12/06/2020 27.6  26.0 - 34.0 pg Final  . MCHC 12/06/2020 31.3  30.0 - 36.0 g/dL Final  . RDW 78/46/9629 15.9* 11.5 - 15.5 % Final  . Platelets 12/06/2020 317  150 - 400 K/uL Final  . nRBC 12/06/2020 0.0  0.0 - 0.2 % Final   Performed at [facility], [address] [place], [place] [address]  . Sodium 12/06/2020 138  135 - 145 mmol/L Final  . Potassium 12/06/2020 4.3  3.5 - 5.1 mmol/L Final  . Chloride 12/06/2020 103  98 - 111  mmol/L Final  . CO2 12/06/2020 26  22 - 32 mmol/L Final  . Glucose, Bld 12/06/2020 99  70 - 99 mg/dL Final   Glucose reference range applies only to samples taken after fasting for at least 8 hours.  . BUN 12/06/2020 18  6 - 20 mg/dL Final  . Creatinine, Ser 12/06/2020 0.87  0.61 - 1.24 mg/dL Final  . Calcium 93/26/7124 8.7* 8.9 - 10.3 mg/dL Final  . Total Protein 12/06/2020 7.2  6.5 - 8.1 g/dL Final  . Albumin 58/06/9832 3.5  3.5 - 5.0 g/dL Final  . AST 82/50/5397 29  15 - 41 U/L Final  . ALT 12/06/2020 23  0 - 44 U/L Final  . Alkaline Phosphatase 12/06/2020 65  38 - 126 U/L Final  . Total Bilirubin 12/06/2020 0.9  0.3 - 1.2 mg/dL Final  .  GFR, Estimated 12/06/2020 >60  >60 mL/min Final   Comment: (NOTE) Calculated using the CKD-EPI Creatinine Equation (2021)   . Anion gap 12/06/2020 9  5 - 15 Final   Performed at [facility], [address], [place], [place] [address]  . Prothrombin Time 12/06/2020 13.7  11.4 - 15.2 seconds Final  . INR 12/06/2020 1.1  0.8 - 1.2 Final   Comment: (NOTE) INR goal varies based on device and disease states. Performed at [facility], [address] [place], [place] [address]   . aPTT 12/06/2020 30  24 - 36 seconds Final   Performed at [facility], [address], [place], [place] [address]  . ABO/RH(D) 12/06/2020 O POS   Final  . Antibody Screen 12/06/2020 NEG   Final  . Sample Expiration 12/06/2020 12/10/2020,2359   Final  . Extend sample reason 12/06/2020    Final                   Value:NO TRANSFUSIONS OR PREGNANCY IN THE PAST 3 MONTHS Performed at [facility], [address] [place], [place] [address]   Appointment on 11/29/2020  Component Date Value Ref Range Status  . CRP 11/29/2020 32.7* <8.0 mg/L Final  . Sed Rate 11/29/2020 25* 0 - 20 mm/h Final  Office Visit on 11/16/2020  Component Date Value Ref Range Status  . WBC 11/16/2020 8.6  3.8 - 10.8 Thousand/uL Final  . RBC 11/16/2020 4.81  4.20 - 5.80 Million/uL Final  . Hemoglobin 11/16/2020 13.2  13.2 - 17.1 g/dL Final  . HCT 92/42/6834 40.5  38.5 - 50.0 % Final  . MCV 11/16/2020 84.2  80.0 - 100.0 fL Final  . MCH 11/16/2020 27.4  27.0 - 33.0 pg Final  . MCHC 11/16/2020 32.6  32.0 - 36.0 g/dL Final  . RDW 19/62/2297 15.7* 11.0 - 15.0 % Final  . Platelets 11/16/2020 332  140 - 400 Thousand/uL Final  . MPV 11/16/2020 9.4  7.5 - 12.5 fL Final  . Glucose, Bld 11/16/2020 96  65 - 99 mg/dL Final   Comment: .            Fasting reference interval .   . BUN 11/16/2020 18  7 - 25 mg/dL Final  . Creat 98/92/1194 0.87  0.70 - 1.25 mg/dL Final    Comment: For patients >74 years of age, the reference limit for Creatinine is approximately 13% higher for people identified as African-American. .   Edwena Felty Ratio 11/16/2020 NOT APPLICABLE  6 - 22 (calc) Final  . Sodium 11/16/2020 139  135 - 146 mmol/L  Final  . Potassium 11/16/2020 4.6  3.5 - 5.3 mmol/L Final  . Chloride 11/16/2020 105  98 - 110 mmol/L Final  . CO2 11/16/2020 24  20 - 32 mmol/L Final  . Calcium 11/16/2020 9.0  8.6 - 10.3 mg/dL Final  . Total Protein 11/16/2020 6.4  6.1 - 8.1 g/dL Final  . Albumin 16/07/9603 3.6  3.6 - 5.1 g/dL Final  . Globulin 54/06/8118 2.8  1.9 - 3.7 g/dL (calc) Final  . AG Ratio 11/16/2020 1.3  1.0 - 2.5 (calc) Final  . Total Bilirubin 11/16/2020 0.3  0.2 - 1.2 mg/dL Final  . Alkaline phosphatase (APISO) 11/16/2020 77  35 - 144 U/L Final  . AST 11/16/2020 23  10 - 35 U/L Final  . ALT 11/16/2020 27  9 - 46 U/L Final  . CRP 11/16/2020 37.4* <8.0 mg/L Final  . Sed Rate 11/16/2020 31* 0 - 20 mm/h Final  Office Visit on 10/18/2020  Component Date Value Ref Range Status  . WBC 10/18/2020 9.8  3.8 - 10.8 Thousand/uL Final  . RBC 10/18/2020 4.87  4.20 - 5.80 Million/uL Final  . Hemoglobin 10/18/2020 13.3  13.2 - 17.1 g/dL Final  . HCT 14/78/2956 41.0  38.5 - 50.0 % Final  . MCV 10/18/2020 84.2  80.0 - 100.0 fL Final  . MCH 10/18/2020 27.3  27.0 - 33.0 pg Final  . MCHC 10/18/2020 32.4  32.0 - 36.0 g/dL Final  . RDW 21/30/8657 16.5* 11.0 - 15.0 % Final  . Platelets 10/18/2020 365  140 - 400 Thousand/uL Final  . MPV 10/18/2020 9.6  7.5 - 12.5 fL Final  . Glucose, Bld 10/18/2020 91  65 - 99 mg/dL Final   Comment: .            Fasting reference interval .   . BUN 10/18/2020 14  7 - 25 mg/dL Final  . Creat 84/69/6295 0.97  0.70 - 1.33 mg/dL Final   Comment: For patients >57 years of age, the reference limit for Creatinine is approximately 13% higher for people identified as African-American. .   Edwena Felty Ratio 10/18/2020 NOT  APPLICABLE  6 - 22 (calc) Final  . Sodium 10/18/2020 140  135 - 146 mmol/L Final  . Potassium 10/18/2020 4.5  3.5 - 5.3 mmol/L Final  . Chloride 10/18/2020 106  98 - 110 mmol/L Final  . CO2 10/18/2020 27  20 - 32 mmol/L Final  . Calcium 10/18/2020 8.9  8.6 - 10.3 mg/dL Final  . Total Protein 10/18/2020 6.7  6.1 - 8.1 g/dL Final  . Albumin 28/41/3244 3.6  3.6 - 5.1 g/dL Final  . Globulin 10/17/7251 3.1  1.9 - 3.7 g/dL (calc) Final  . AG Ratio 10/18/2020 1.2  1.0 - 2.5 (calc) Final  . Total Bilirubin 10/18/2020 0.5  0.2 - 1.2 mg/dL Final  . Alkaline phosphatase (APISO) 10/18/2020 79  35 - 144 U/L Final  . AST 10/18/2020 30  10 - 35 U/L Final  . ALT 10/18/2020 29  9 - 46 U/L Final  . CRP 10/18/2020 45.8* <8.0 mg/L Final  . Sed Rate 10/18/2020 41* 0 - 20 mm/h Final     X-Rays:No results found.  EKG: Orders placed or performed during the hospital encounter of 12/06/20  . EKG 12 lead per protocol  . EKG 12 lead per protocol     Hospital Course: [name] is a 60 y.o. who was admitted to [facility]. They were brought to the operating room  on 12/07/2020 and underwent Procedure(s): REIMPLANTATION OF RIGHT TOTAL KNEE.  Patient tolerated the procedure well and was later transferred to the recovery room and then to the orthopaedic floor for postoperative care. They were given PO and IV analgesics for pain control following their surgery. They were given 24 hours of postoperative antibiotics of  Anti-infectives (From admission, onward)   Start     Dose/Rate Route Frequency Ordered Stop   12/07/20 2200  ceFAZolin (ANCEF) IVPB 2g/100 mL premix        2 g 200 mL/hr over 30 Minutes Intravenous Every 6 hours 12/07/20 2007 12/08/20 0501   12/07/20 0600  ceFAZolin (ANCEF) 3 g in dextrose 5 % 50 mL IVPB        3 g 100 mL/hr over 30 Minutes Intravenous On call to O.R. 12/06/20 2130 12/07/20 1542     and started on DVT prophylaxis in the form of Aspirin.   PT and OT were ordered for total  joint protocol. Discharge planning consulted to help with postop disposition and equipment needs. Patient had an uneventful night on the evening of surgery. They started to get up OOB with therapy on 12/08/20. Continued to work with therapy into POD #2. Pt was seen during rounds on day two and was ready to go home pending progress with therapy. Dressing was changed and the incision was clean and dry. Pt worked with therapy for two additional sessions and was meeting their goals. Patient was discharged to home later that day in stable condition.  Diet: Regular diet Activity: WBAT Follow-up: in two weeks Disposition: Home Discharged Condition: good   Discharge Instructions    Call MD / Call 911   Complete by: As directed    If you experience chest pain or shortness of breath, CALL 911 and be transported to the hospital emergency room.  If you develope a fever above 101 F, pus (white drainage) or increased drainage or redness at the wound, or calf pain, call your surgeon's office.   Change dressing   Complete by: As directed    You may remove the bulky bandage (ACE wrap and gauze) two days after surgery. You will have an adhesive waterproof bandage underneath. Leave this in place until your first follow-up appointment.   Constipation Prevention   Complete by: As directed    Drink plenty of fluids.  Prune juice may be helpful.  You may use a stool softener, such as Colace (over the counter) 100 mg twice a day.  Use MiraLax (over the counter) for constipation as needed.   Diet - low sodium heart healthy   Complete by: As directed    Do not put a pillow under the knee. Place it under the heel.   Complete by: As directed    Driving restrictions   Complete by: As directed    No driving for two weeks   Increase activity slowly as tolerated   Complete by: As directed    Limit bending of knee to less than 40-50 degrees.   TED hose   Complete by: As directed    Use stockings (TED hose) for three  weeks on both leg(s).  You may remove them at night for sleeping.   Weight bearing as tolerated   Complete by: As directed      Allergies as of 12/09/2020      Reactions   Avelox [moxifloxacin Hcl In Nacl] Hives, Shortness Of Breath, Itching   Tolerates Cipro   Doxycycline Itching  Rocephin [ceftriaxone Sodium In Dextrose] Hives, Itching   Patient tolerated ancef 3g on 12/07/20   Sulfa Antibiotics Itching   Vancomycin Hives, Itching   E.e.s. [erythromycin] Rash   Erythrocin Rash   Penicillins Rash   Tolerated augmentin    Sulfasalazine Rash      Medication List    STOP taking these medications   folic acid 1 MG tablet Commonly known as: FOLVITE   multivitamin tablet   Plenvu 140 g Solr Generic drug: PEG-KCl-NaCl-NaSulf-Na Asc-C   VITAMIN D3 PO     TAKE these medications   aspirin 325 MG EC tablet Take 1 tablet (325 mg total) by mouth 2 (two) times daily. Then take one 81 mg aspirin once a day for three weeks. Then discontinue aspirin.   Combivent Respimat 20-100 MCG/ACT Aers respimat Generic drug: Ipratropium-Albuterol Inhale 1 puff into the lungs every 6 (six) hours as needed for wheezing or shortness of breath.   diltiazem 240 MG 24 hr capsule Commonly known as: CARDIZEM CD Take 240 mg by mouth daily.   diphenoxylate-atropine 2.5-0.025 MG/5ML liquid Commonly known as: LOMOTIL Take 5 mLs by mouth 4 (four) times daily as needed for diarrhea or loose stools.   fluconazole 100 MG tablet Commonly known as: DIFLUCAN Take 100 mg by mouth daily.   Fluticasone-Salmeterol 250-50 MCG/DOSE Aepb Commonly known as: ADVAIR Inhale 1 puff into the lungs daily.   Hydromet 5-1.5 MG/5ML syrup Generic drug: HYDROcodone-homatropine Take by mouth.   losartan 100 MG tablet Commonly known as: COZAAR Take 100 mg by mouth daily.   methocarbamol 500 MG tablet Commonly known as: ROBAXIN Take 1 tablet (500 mg total) by mouth every 6 (six) hours as needed for muscle spasms.    methotrexate 2.5 MG tablet Commonly known as: RHEUMATREX Take 10 mg by mouth every Monday. Caution:Chemotherapy. Protect from light.   metoprolol succinate 100 MG 24 hr tablet Commonly known as: TOPROL-XL Take 100 mg by mouth daily. Take with or immediately following a meal.   nystatin powder Commonly known as: MYCOSTATIN/NYSTOP Apply 1 application topically 3 (three) times daily.   oxyCODONE 5 MG immediate release tablet Commonly known as: Oxy IR/ROXICODONE Take 1-2 tablets (5-10 mg total) by mouth every 6 (six) hours as needed for moderate pain or severe pain. What changed: reasons to take this   RABEprazole 20 MG tablet Commonly known as: ACIPHEX Take 20 mg by mouth daily before breakfast.   Rybelsus 3 MG Tabs Generic drug: Semaglutide Take by mouth daily.   traMADol 50 MG tablet Commonly known as: ULTRAM Take 1-2 tablets (50-100 mg total) by mouth every 6 (six) hours as needed for moderate pain.   triamterene-hydrochlorothiazide 37.5-25 MG tablet Commonly known as: MAXZIDE-25 Take 1 tablet by mouth daily.            Discharge Care Instructions  (From admission, onward)         Start     Ordered   12/09/20 0000  Weight bearing as tolerated        12/09/20 0800   12/09/20 0000  Change dressing       Comments: You may remove the bulky bandage (ACE wrap and gauze) two days after surgery. You will have an adhesive waterproof bandage underneath. Leave this in place until your first follow-up appointment.   12/09/20 0800          Follow-up Information    Ollen Gross, MD. Schedule an appointment as soon as possible for a visit on 12/20/2020.  Specialty: Orthopedic Surgery Contact information: [address] [place] [address] [place] [address] [phone]               Signed: Nelia Shi, MBA, PA-C Orthopedic Surgery 12/15/2020, 10:30 PM

## 2020-12-16 ENCOUNTER — Encounter: Payer: 59 | Admitting: Gastroenterology

## 2020-12-22 ENCOUNTER — Ambulatory Visit (INDEPENDENT_AMBULATORY_CARE_PROVIDER_SITE_OTHER): Payer: 59 | Admitting: Infectious Diseases

## 2020-12-22 ENCOUNTER — Encounter: Payer: Self-pay | Admitting: Infectious Diseases

## 2020-12-22 ENCOUNTER — Other Ambulatory Visit: Payer: Self-pay

## 2020-12-22 VITALS — BP 115/74 | HR 69 | Temp 97.8°F

## 2020-12-22 DIAGNOSIS — T8450XD Infection and inflammatory reaction due to unspecified internal joint prosthesis, subsequent encounter: Secondary | ICD-10-CM

## 2020-12-22 NOTE — Progress Notes (Signed)
[facility]                                                             [address], [place], [place], [address]                                                                  Phn. [phone]; Fax: [phone]                                                                             Date: 12/22/20  Reason for Referral: PJI of left knee  Assessment # Recurrent Rt knee PJI -Patient has completed 6 weeks of IV Zosyn on10/22 after which patient wasstarted on Augmentin 875/125mg  PO BID until 2/2 when it was stopped -s/p re-implantation of rt TKA in 12/07/20. OR tissue cultures No growth, no organisms in gram stain   #Rheumatoid Arthritis on Methotrexate Follows up with Rheumatology   Plan No indication for antibiotics since OR cultures are no growth ( finalized) Fu with Ortho as instructed Fu as needed   All questions and concerns were discussed and addressed. Patient verbalized understanding of the plan. ____________________________________________________________________________________________________________________ Subjective/Internal events  10/18/20 Patient is here with his wife. He has been taking Augmentin twice a day without any issues or side effects. Denies any pain at the rt knee currently but the pain is variable and most of the time he has no pain unless he stands up and tries to move around. He has minimal clear drainage from the anterior portion of the rt knee. He says he saw orthopedic surgeon Dr Dellia Cloud in end of December and had fluid from the joint aspirated ( he does not think cultures were done). He says the surgeon is holding off on the joint re-implantation given rising inflammatory markers.  He is also worried about being  laid off from his job as he is approaching almost 6 months since being out from work and will not have his insurance coverage thereafter.    11/16/20 Patient is here with his wife. Taking Augmentin twice a day as before. No new complaints since last visit. Denies any pain/tenderness and swelling at the Rt knee. Denies any drainage. Denies any fevers, chills and sweats. Denies N/V/D/rashes and allergy. He is following up with Dr Despina Hick for timing of re-implantation.  They are very eager to have the joint re-implanted as soon as possible because of the fear of running out of his insurance. I have also staff messaged Dr Despina Hick regarding stopping oral antibiotics today and monitor him off antibiotics with plans for re-implantation.   12/22/20 Since last visit in 2/2 patient underwent re-implantation of rt TKA in 12/07/20. OR tissue cultures No growth, no organisms in gram stain which is a good  sign. His wife showed me a picture of his left knee which shows well healed surgical site. He is not taking any antibiotics. He feels very happy and appreciative for his care. Following up with Orthopedics. No complaints today.   ROS: Constitutional: Negative for fever, chills,  appetite change, fatigue and unexpected weight change.  HENT: Negative for congestion, sore throat, rhinorrhea, sneezing, trouble swallowing and sinus pressure.  Eyes: Negative for photophobia and visual disturbance.  Respiratory: Negative for cough, chest tightness, shortness of breath, wheezing and stridor.  Cardiovascular: Negative for chest pain, palpitations and leg swelling.  Gastrointestinal: Negative for nausea, vomiting, abdominal pain, diarrhea, constipation, blood in stool, abdominal distention and anal bleeding.  Genitourinary: Negative for dysuria, hematuria, flank pain and difficulty urinating.  Musculoskeletal: Negative for myalgias, back pain,  and gait problem.  Skin: Negative for color change, pallor, rash and wound.  Neurological: Negative for dizziness, tremors, weakness and light-headedness.  Hematological: Negative for adenopathy. Does not bruise/bleed  easily.  Psychiatric/Behavioral: Negative for behavioral problems, confusion, sleep disturbance, dysphoric mood, decreased concentration and agitation.   Past Medical History:  Diagnosis Date  . Allergy    OCC   . Anemia    hx of  . Asthma   . Cataract    small- forming   . Cellulitis of lower extremity    "usually RLL; this time it's got up to my lower abdoment" (02/11/2014)-series of antibiotics completed 02-28-14  . Diverticulosis   . Edema    legs  . GERD (gastroesophageal reflux disease)   . Hypertension   . Infection of right knee (HCC)   . OSA (obstructive sleep apnea)    does not use cpap   . Pneumonia    20+ years ago  . PONV (postoperative nausea and vomiting)   . Rheumatoid arthritis (HCC) dx'd ~ 1977  . S/P PICC central line placement 03-16-14   right upper arm remains intact.05-11-14 removed 1 week ago.  . Sleep apnea    no cpap    Past Surgical History:  Procedure Laterality Date  . COLONOSCOPY  2012  . EXCISIONAL HEMORRHOIDECTOMY    . EXCISIONAL TOTAL KNEE ARTHROPLASTY WITH ANTIBIOTIC SPACERS Right 03/18/2014   Procedure: RIGHT KNEE RESECTION ARTHROPLASTY WITH ANTIBIOTIC SPACERS;  Surgeon: Loanne Drilling, MD;  Location: [facility];  Service: Orthopedics;  Laterality: Right;  . EXCISIONAL TOTAL KNEE ARTHROPLASTY WITH ANTIBIOTIC SPACERS Right 06/22/2020   Procedure: Right knee resection arthroplasty, antibiotic spacer;  Surgeon: Ollen Gross, MD;  Location: [facility];  Service: Orthopedics;  Laterality: Right;   . FOREIGN BODY REMOVAL Left    "BB" removal above left eye-teen yrs.  Marland Kitchen HERNIA REPAIR    . I & D KNEE WITH POLY EXCHANGE Right 04/25/2016   Procedure: RIGHT KNEE IRRIGATION AND DEBRIDEMENT WITH POLY EXCHANGE;  Surgeon: Ollen Gross, MD;  Location: [facility];  Service: Orthopedics;  Laterality: Right;  . I & D KNEE WITH POLY EXCHANGE Right 10/01/2016   Procedure: IRRIGATION AND DEBRIDEMENT KNEE WITH POLY EXCHANGE;  Surgeon: Ollen Gross, MD;  Location: [facility];   Service: Orthopedics;  Laterality: Right;  . I & D KNEE WITH POLY EXCHANGE Right 08/26/2017   Procedure: IRRIGATION AND DEBRIDEMENT KNEE WITH POLY EXCHANGE;  Surgeon: Ollen Gross, MD;  Location: [facility];  Service: Orthopedics;  Laterality: Right;  . I & D KNEE WITH POLY EXCHANGE Right 11/16/2019   Procedure: IRRIGATION AND DEBRIDEMENT KNEE WITH POLY EXCHANGE;  Surgeon: Ollen Gross, MD;  Location: [facility];  Service: Orthopedics;  Laterality: Right;   . I & D KNEE WITH POLY EXCHANGE Right 02/22/2020   Procedure: Right knee irrigation and debridement;  Surgeon: Ollen Gross, MD;  Location: [facility];  Service: Orthopedics;  Laterality: Right;   . INCISION AND DRAINAGE Right 09/28/2014   Procedure: INCISION AND DRAINAGE RIGHT KNEE;  Surgeon: Loanne Drilling, MD;  Location: [facility];  Service: Orthopedics;  Laterality: Right;  . KNEE BURSECTOMY Right 03/13/2017   Procedure: RIGHT KNEE PREPATELLAR BURSECTOMY;  Surgeon: Ollen Gross, MD;  Location: [facility];  Service: Orthopedics;  Laterality: Right;  . picc line placement Right    right upper arm  . REIMPLANTATION OF TOTAL KNEE Right 12/07/2020   Procedure: REIMPLANTATION OF RIGHT TOTAL KNEE;  Surgeon: Ollen Gross, MD;  Location: [facility];  Service: Orthopedics;  Laterality: Right;  2 hrs  . 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 REVISION TOTAL KNEE ARTHROPLASTY Right 2012  . SPIGELIAN HERNIA Right 12/04/2015   Procedure: INCARCERATED SPIGELIAN HERNIA REPAIR WITH MESH;  Surgeon: Violeta Gelinas, MD;  [facility];  Service: General;  Laterality: Right;  . TOTAL KNEE ARTHROPLASTY Right 05/21/2014   Procedure: RIGHT KNEE ARTHROPLASTY REINPLANTATION;  Surgeon: Loanne Drilling, MD;  Location: [facility];  Service: Orthopedics;  Laterality: Right;   Current Outpatient Medications on File Prior to Visit  Medication Sig  Dispense Refill  . aspirin EC 325 MG EC tablet Take 1 tablet (325 mg total) by mouth 2 (two) times daily. Then take one 81 mg aspirin once a day for three weeks. Then discontinue aspirin. 42 tablet 0  . diltiazem (CARDIZEM CD) 240 MG 24 hr capsule Take 240 mg by mouth daily.     . diphenoxylate-atropine (LOMOTIL) 2.5-0.025 MG/5ML liquid Take 5 mLs by mouth 4 (four) times daily as needed for diarrhea or loose stools. 60 mL 4  . fluconazole (DIFLUCAN) 100 MG tablet Take 100 mg by mouth daily.    . Fluticasone-Salmeterol (ADVAIR) 250-50 MCG/DOSE AEPB Inhale 1 puff into the lungs daily.    Marland Kitchen HYDROMET 5-1.5 MG/5ML syrup Take by mouth.    . Ipratropium-Albuterol (COMBIVENT RESPIMAT) 20-100 MCG/ACT AERS respimat Inhale 1 puff into the lungs every 6 (six) hours as needed for wheezing or shortness of breath.    . losartan (COZAAR) 100 MG tablet Take 100 mg by mouth daily.     . methocarbamol (ROBAXIN) 500 MG tablet Take 1 tablet (500 mg total) by mouth every 6 (six) hours as needed for muscle spasms. 40 tablet 0  . methotrexate (RHEUMATREX) 2.5 MG tablet Take 10 mg by mouth every Monday. Caution:Chemotherapy. Protect from light.    . metoprolol succinate (TOPROL-XL) 100 MG 24 hr tablet Take 100 mg by mouth daily. Take with or immediately following a meal.    . nystatin (MYCOSTATIN/NYSTOP) powder Apply 1 application topically 3 (three) times daily. 30 g 5  . oxyCODONE (OXY IR/ROXICODONE) 5 MG immediate release tablet Take 1-2 tablets (5-10 mg total) by mouth every 6 (six) hours as needed for moderate pain or severe pain. 42 tablet 0  . RABEprazole (ACIPHEX) 20 MG tablet Take 20 mg by mouth daily before breakfast.     . Semaglutide (RYBELSUS) 3 MG TABS Take by mouth daily.    . traMADol (ULTRAM) 50 MG tablet Take 1-2 tablets (50-100 mg total) by mouth every 6 (  six) hours as needed for moderate pain. 40 tablet 0  . triamterene-hydrochlorothiazide (MAXZIDE-25) 37.5-25 MG tablet Take 1 tablet by mouth daily.      No current facility-administered medications on file prior to visit.   Allergies  Allergen Reactions  . Avelox [Moxifloxacin Hcl In Nacl] Hives, Shortness Of Breath and Itching    Tolerates Cipro  . Doxycycline Itching  . Rocephin [Ceftriaxone Sodium In Dextrose] Hives and Itching    Patient tolerated ancef 3g on 12/07/20  . Sulfa Antibiotics Itching  . Vancomycin Hives and Itching  . E.E.S. [Erythromycin] Rash  . Erythrocin Rash  . Penicillins Rash    Tolerated augmentin   . Sulfasalazine Rash    Social History   Socioeconomic History  . Marital status: Married    Spouse name: Not on file  . Number of children: 1  . Years of education: 53  . Highest education level: Not on file  Occupational History  . Not on file  Tobacco Use  . Smoking status: Former Smoker    Packs/day: 1.00    Years: 3.00    Pack years: 3.00    Types: Cigarettes    Quit date: 03/16/1981    Years since quitting: 39.7  . Smokeless tobacco: Current User    Types: Snuff  . Tobacco comment: "quit smoking cigarettes in the 1980's"  Vaping Use  . Vaping Use: Never used  Substance and Sexual Activity  . Alcohol use: No  . Drug use: No  . Sexual activity: Yes    Partners: Male  Other Topics Concern  . Not on file  Social History Narrative  . Not on file   Social Determinants of Health   Financial Resource Strain: Not on file  Food Insecurity: Not on file  Transportation Needs: Not on file  Physical Activity: Not on file  Stress: Not on file  Social Connections: Not on file  Intimate Partner Violence: Not on file     Vitals BP 115/74   Pulse 69   Temp 97.8 F (36.6 C) (Oral)    Examination  General - not in acute distress, comfortably sitting in chair, OBESE  HEENT - PEERLA, no pallor and no icterus Chest - b/l clear air entry, no additional sounds CVS- Normal s1s2, RRR Abdomen - Soft, Non tender , non distended Ext- no pedal edema LEFT KNEE: BANDAGED, WRAPPED IN AN  IMMOBILIZER    Neuro: grossly normal Back - WNL Psych : calm and cooperative   Recent labs CBC Latest Ref Rng & Units 12/09/2020 12/08/2020 12/06/2020  WBC 4.0 - 10.5 K/uL 11.7(H) 10.9(H) 8.0  Hemoglobin 13.0 - 17.0 g/dL 11.1(L) 11.7(L) 13.5  Hematocrit 39.0 - 52.0 % 36.5(L) 38.1(L) 43.1  Platelets 150 - 400 K/uL 318 304 317   CMP Latest Ref Rng & Units 12/09/2020 12/08/2020 12/06/2020  Glucose 70 - 99 mg/dL 366(Y) 403(K) 99  BUN 6 - 20 mg/dL 18 18 18   Creatinine 0.61 - 1.24 mg/dL 7.42 5.95 6.38  Sodium 135 - 145 mmol/L 138 135 138  Potassium 3.5 - 5.1 mmol/L 5.3(H) 5.1 4.3  Chloride 98 - 111 mmol/L 106 104 103  CO2 22 - 32 mmol/L 26 25 26   Calcium 8.9 - 10.3 mg/dL 7.5(I) 4.3(P) 2.9(J)  Total Protein 6.5 - 8.1 g/dL - - 7.2  Total Bilirubin 0.3 - 1.2 mg/dL - - 0.9  Alkaline Phos 38 - 126 U/L - - 65  AST 15 - 41 U/L - - 29  ALT 0 -  44 U/L - - 23     Pertinent Microbiology Results for orders placed or performed during the hospital encounter of 12/07/20  Aerobic/Anaerobic Culture w Gram Stain (surgical/deep wound)     Status: None   Collection Time: 12/07/20  3:45 PM   Specimen: Synovial, Left Knee; Body Fluid  Result Value Ref Range Status   Specimen Description   Final    SYNOVIAL FLUID RIGHT KNEE DEEP Performed at [facility], [address], [place], [place] [address]    Special Requests   Final    NONE Performed at [facility], [address] [place], [place] [address]    Gram Stain   Final    NO WBC SEEN NO ORGANISMS SEEN Gram Stain Report Called to,Read Back By and Verified With: S.ROVEK AT 1717 ON 02.23.22 BY N.THOMPSON Performed at [facility], [address], [place], [place] [address]    Culture   Final    No growth aerobically or anaerobically. Performed at [facility], [address] [place], [place] [address]    Report Status 12/13/2020 FINAL  Final  Aerobic/Anaerobic Culture w Gram  Stain (surgical/deep wound)     Status: None   Collection Time: 12/07/20  3:46 PM   Specimen: Synovium; Tissue  Result Value Ref Range Status   Specimen Description   Final    TISSUE RIGHT KNEE SYNOVIUM Performed at [facility], [address] [place], [place] [address]    Special Requests   Final    NONE Performed at [facility], [address] [place], [place] [address]    Gram Stain   Final    RARE WBC PRESENT,BOTH PMN AND MONONUCLEAR NO ORGANISMS SEEN Gram Stain Report Called to,Read Back By and Verified With: S.ROVEK AT 1717 ON 02.23.22 BY N.THOMPSON Performed at [facility], [address], [place], [place] [address]    Culture   Final    No growth aerobically or anaerobically. Performed at [facility], [address] [place], [place] [address]    Report Status 12/13/2020 FINAL  Final    Pertinent Imaging All pertinent labs/Imagings/notes reviewed. All pertinent plain films and CT images have been personally visualized and interpreted; radiology reports have been reviewed. Decision making incorporated into the Impression / Recommendations.  I spent 30 minutes with the patient including  review of prior medical records with greater than 50% of time in face to face counsel of the patient.    Electronically signed by:  Odette Fraction, MD Infectious Disease Physician [facility] [address] [place], [place] [address] Phone: [phone]  Fax: [phone]

## 2021-03-16 ENCOUNTER — Telehealth: Payer: Self-pay

## 2021-03-16 NOTE — Telephone Encounter (Signed)
Received faxed request for medical records, placed in front office to be processed by HIM.   Letroy Vazguez D Anamae Rochelle, RN  

## 2021-04-05 ENCOUNTER — Ambulatory Visit (HOSPITAL_COMMUNITY): Payer: 59 | Admitting: Certified Registered"

## 2021-04-05 ENCOUNTER — Encounter (HOSPITAL_COMMUNITY): Payer: Self-pay | Admitting: Orthopedic Surgery

## 2021-04-05 ENCOUNTER — Other Ambulatory Visit: Payer: Self-pay

## 2021-04-05 ENCOUNTER — Inpatient Hospital Stay (HOSPITAL_COMMUNITY)
Admission: AD | Admit: 2021-04-05 | Discharge: 2021-04-08 | DRG: 463 | Disposition: A | Discharge status: Home Health | Payer: 59 | Attending: Orthopedic Surgery | Admitting: Orthopedic Surgery

## 2021-04-05 ENCOUNTER — Encounter (HOSPITAL_COMMUNITY)
Admission: AD | Disposition: A | Discharge status: Home Health | Payer: Self-pay | Source: Home / Self Care | Attending: Orthopedic Surgery

## 2021-04-05 DIAGNOSIS — I890 Lymphedema, not elsewhere classified: Secondary | ICD-10-CM | POA: Diagnosis present

## 2021-04-05 DIAGNOSIS — Z881 Allergy status to other antibiotic agents status: Secondary | ICD-10-CM

## 2021-04-05 DIAGNOSIS — Z8619 Personal history of other infectious and parasitic diseases: Secondary | ICD-10-CM

## 2021-04-05 DIAGNOSIS — R001 Bradycardia, unspecified: Secondary | ICD-10-CM | POA: Diagnosis not present

## 2021-04-05 DIAGNOSIS — Z888 Allergy status to other drugs, medicaments and biological substances status: Secondary | ICD-10-CM

## 2021-04-05 DIAGNOSIS — U071 COVID-19: Secondary | ICD-10-CM | POA: Diagnosis present

## 2021-04-05 DIAGNOSIS — Z87891 Personal history of nicotine dependence: Secondary | ICD-10-CM

## 2021-04-05 DIAGNOSIS — Y831 Surgical operation with implant of artificial internal device as the cause of abnormal reaction of the patient, or of later complication, without mention of misadventure at the time of the procedure: Secondary | ICD-10-CM | POA: Diagnosis present

## 2021-04-05 DIAGNOSIS — Z96653 Presence of artificial knee joint, bilateral: Secondary | ICD-10-CM | POA: Diagnosis present

## 2021-04-05 DIAGNOSIS — M069 Rheumatoid arthritis, unspecified: Secondary | ICD-10-CM | POA: Diagnosis present

## 2021-04-05 DIAGNOSIS — Z7951 Long term (current) use of inhaled steroids: Secondary | ICD-10-CM

## 2021-04-05 DIAGNOSIS — Z880 Allergy status to penicillin: Secondary | ICD-10-CM

## 2021-04-05 DIAGNOSIS — G4733 Obstructive sleep apnea (adult) (pediatric): Secondary | ICD-10-CM | POA: Diagnosis present

## 2021-04-05 DIAGNOSIS — Z883 Allergy status to other anti-infective agents status: Secondary | ICD-10-CM

## 2021-04-05 DIAGNOSIS — Z8249 Family history of ischemic heart disease and other diseases of the circulatory system: Secondary | ICD-10-CM

## 2021-04-05 DIAGNOSIS — M009 Pyogenic arthritis, unspecified: Secondary | ICD-10-CM | POA: Diagnosis present

## 2021-04-05 DIAGNOSIS — Z8701 Personal history of pneumonia (recurrent): Secondary | ICD-10-CM

## 2021-04-05 DIAGNOSIS — K219 Gastro-esophageal reflux disease without esophagitis: Secondary | ICD-10-CM | POA: Diagnosis present

## 2021-04-05 DIAGNOSIS — J45909 Unspecified asthma, uncomplicated: Secondary | ICD-10-CM | POA: Diagnosis present

## 2021-04-05 DIAGNOSIS — I10 Essential (primary) hypertension: Secondary | ICD-10-CM

## 2021-04-05 DIAGNOSIS — N179 Acute kidney failure, unspecified: Secondary | ICD-10-CM

## 2021-04-05 DIAGNOSIS — Z882 Allergy status to sulfonamides status: Secondary | ICD-10-CM

## 2021-04-05 DIAGNOSIS — T8453XA Infection and inflammatory reaction due to internal right knee prosthesis, initial encounter: Principal | ICD-10-CM | POA: Diagnosis present

## 2021-04-05 DIAGNOSIS — H269 Unspecified cataract: Secondary | ICD-10-CM | POA: Diagnosis present

## 2021-04-05 DIAGNOSIS — Z6841 Body Mass Index (BMI) 40.0 and over, adult: Secondary | ICD-10-CM

## 2021-04-05 DIAGNOSIS — Z79899 Other long term (current) drug therapy: Secondary | ICD-10-CM

## 2021-04-05 HISTORY — PX: IRRIGATION AND DEBRIDEMENT KNEE: SHX5185

## 2021-04-05 LAB — CBC WITH DIFFERENTIAL/PLATELET
Abs Immature Granulocytes: 0.05 10*3/uL (ref 0.00–0.07)
Basophils Absolute: 0.1 10*3/uL (ref 0.0–0.1)
Basophils Relative: 1 %
Eosinophils Absolute: 0.2 10*3/uL (ref 0.0–0.5)
Eosinophils Relative: 3 %
HCT: 37.2 % — ABNORMAL LOW (ref 39.0–52.0)
Hemoglobin: 11.5 g/dL — ABNORMAL LOW (ref 13.0–17.0)
Immature Granulocytes: 1 %
Lymphocytes Relative: 3 %
Lymphs Abs: 0.3 10*3/uL — ABNORMAL LOW (ref 0.7–4.0)
MCH: 26.4 pg (ref 26.0–34.0)
MCHC: 30.9 g/dL (ref 30.0–36.0)
MCV: 85.5 fL (ref 80.0–100.0)
Monocytes Absolute: 0.1 10*3/uL (ref 0.1–1.0)
Monocytes Relative: 1 %
Neutro Abs: 8.9 10*3/uL — ABNORMAL HIGH (ref 1.7–7.7)
Neutrophils Relative %: 91 %
Platelets: 336 10*3/uL (ref 150–400)
RBC: 4.35 MIL/uL (ref 4.22–5.81)
RDW: 19.1 % — ABNORMAL HIGH (ref 11.5–15.5)
WBC: 9.6 10*3/uL (ref 4.0–10.5)
nRBC: 0 % (ref 0.0–0.2)

## 2021-04-05 LAB — SARS CORONAVIRUS 2 BY RT PCR (HOSPITAL ORDER, PERFORMED IN ~~LOC~~ HOSPITAL LAB): SARS Coronavirus 2: POSITIVE — AB

## 2021-04-05 LAB — BASIC METABOLIC PANEL
Anion gap: 9 (ref 5–15)
BUN: 39 mg/dL — ABNORMAL HIGH (ref 6–20)
CO2: 22 mmol/L (ref 22–32)
Calcium: 8.5 mg/dL — ABNORMAL LOW (ref 8.9–10.3)
Chloride: 106 mmol/L (ref 98–111)
Creatinine, Ser: 3.05 mg/dL — ABNORMAL HIGH (ref 0.61–1.24)
GFR, Estimated: 23 mL/min — ABNORMAL LOW (ref >60)
Glucose, Bld: 111 mg/dL — ABNORMAL HIGH (ref 70–99)
Potassium: 3.9 mmol/L (ref 3.5–5.1)
Sodium: 137 mmol/L (ref 135–145)

## 2021-04-05 SURGERY — IRRIGATION AND DEBRIDEMENT KNEE
Anesthesia: General | Site: Knee | Laterality: Right

## 2021-04-05 MED ORDER — SEMAGLUTIDE 3 MG PO TABS: 3.0000 mg | ORAL_TABLET | Freq: Every day | ORAL | Status: DC

## 2021-04-05 MED ORDER — SODIUM CHLORIDE 0.9 % IR SOLN
Status: DC | PRN
  Administered 2021-04-05: 6000 mL

## 2021-04-05 MED ORDER — 0.9 % SODIUM CHLORIDE (POUR BTL) OPTIME
TOPICAL | Status: DC | PRN
  Administered 2021-04-05: 1000 mL

## 2021-04-05 MED ORDER — LIDOCAINE 2% (20 MG/ML) 5 ML SYRINGE
INTRAMUSCULAR | Status: AC
  Filled 2021-04-05: qty 5

## 2021-04-05 MED ORDER — ACETAMINOPHEN 10 MG/ML IV SOLN
1000.0000 mg | Freq: Four times a day (QID) | INTRAVENOUS | Status: DC
  Filled 2021-04-05: qty 100

## 2021-04-05 MED ORDER — IPRATROPIUM-ALBUTEROL 20-100 MCG/ACT IN AERS: 1.0000 | INHALATION_SPRAY | Freq: Four times a day (QID) | RESPIRATORY_TRACT | Status: DC | PRN

## 2021-04-05 MED ORDER — MENTHOL 3 MG MT LOZG: 1.0000 | LOZENGE | OROMUCOSAL | Status: DC | PRN

## 2021-04-05 MED ORDER — LOSARTAN POTASSIUM 50 MG PO TABS
100.0000 mg | ORAL_TABLET | Freq: Every day | ORAL | Status: DC
  Filled 2021-04-05: qty 2

## 2021-04-05 MED ORDER — OXYCODONE HCL 5 MG PO TABS
5.0000 mg | ORAL_TABLET | ORAL | Status: DC | PRN
Start: 2021-04-05 — End: 2021-04-08

## 2021-04-05 MED ORDER — CEFAZOLIN SODIUM-DEXTROSE 2-4 GM/100ML-% IV SOLN
2.0000 g | Freq: Four times a day (QID) | INTRAVENOUS | Status: AC
  Administered 2021-04-05 – 2021-04-06 (×2): 2 g via INTRAVENOUS
  Filled 2021-04-05 (×2): qty 100

## 2021-04-05 MED ORDER — METOPROLOL SUCCINATE ER 100 MG PO TB24
100.0000 mg | ORAL_TABLET | Freq: Every day | ORAL | Status: DC
  Administered 2021-04-05 – 2021-04-06 (×2): 100 mg via ORAL
  Filled 2021-04-05 (×2): qty 1

## 2021-04-05 MED ORDER — ONDANSETRON HCL 4 MG/2ML IJ SOLN
INTRAMUSCULAR | Status: DC | PRN
  Administered 2021-04-05: 4 mg via INTRAVENOUS

## 2021-04-05 MED ORDER — LIDOCAINE 2% (20 MG/ML) 5 ML SYRINGE
INTRAMUSCULAR | Status: DC | PRN
  Administered 2021-04-05: 100 mg via INTRAVENOUS

## 2021-04-05 MED ORDER — PHENYLEPHRINE HCL-NACL 10-0.9 MG/250ML-% IV SOLN
INTRAVENOUS | Status: DC | PRN
  Administered 2021-04-05: 30 ug/min via INTRAVENOUS

## 2021-04-05 MED ORDER — FENTANYL CITRATE (PF) 100 MCG/2ML IJ SOLN
INTRAMUSCULAR | Status: AC
  Filled 2021-04-05: qty 2

## 2021-04-05 MED ORDER — HYDROMORPHONE HCL 1 MG/ML IJ SOLN
INTRAMUSCULAR | Status: AC
  Filled 2021-04-05: qty 1

## 2021-04-05 MED ORDER — DEXAMETHASONE SODIUM PHOSPHATE 10 MG/ML IJ SOLN
10.0000 mg | Freq: Once | INTRAMUSCULAR | Status: AC
  Administered 2021-04-06: 10 mg via INTRAVENOUS
  Filled 2021-04-05: qty 1

## 2021-04-05 MED ORDER — DIPHENHYDRAMINE HCL 12.5 MG/5ML PO ELIX: 12.5000 mg | ORAL_SOLUTION | ORAL | Status: DC | PRN

## 2021-04-05 MED ORDER — DIPHENOXYLATE-ATROPINE 2.5-0.025 MG/5ML PO LIQD: 5.0000 mL | Freq: Four times a day (QID) | ORAL | Status: DC | PRN

## 2021-04-05 MED ORDER — ONDANSETRON HCL 4 MG PO TABS: 4.0000 mg | ORAL_TABLET | Freq: Four times a day (QID) | ORAL | Status: DC | PRN

## 2021-04-05 MED ORDER — AMISULPRIDE (ANTIEMETIC) 5 MG/2ML IV SOLN: 10.0000 mg | Freq: Once | INTRAVENOUS | Status: DC | PRN

## 2021-04-05 MED ORDER — PANTOPRAZOLE SODIUM 40 MG PO TBEC
40.0000 mg | DELAYED_RELEASE_TABLET | Freq: Every day | ORAL | Status: DC
  Administered 2021-04-06 – 2021-04-08 (×3): 40 mg via ORAL
  Filled 2021-04-05 (×3): qty 1

## 2021-04-05 MED ORDER — OXYCODONE HCL 5 MG PO TABS: 5.0000 mg | ORAL_TABLET | Freq: Once | ORAL | Status: DC | PRN

## 2021-04-05 MED ORDER — BISACODYL 10 MG RE SUPP: 10.0000 mg | Freq: Every day | RECTAL | Status: DC | PRN

## 2021-04-05 MED ORDER — DEXAMETHASONE SODIUM PHOSPHATE 10 MG/ML IJ SOLN: 8.0000 mg | Freq: Once | INTRAMUSCULAR | Status: DC

## 2021-04-05 MED ORDER — METHOCARBAMOL 500 MG IVPB - SIMPLE MED
INTRAVENOUS | Status: AC
  Filled 2021-04-05: qty 50

## 2021-04-05 MED ORDER — TRAMADOL HCL 50 MG PO TABS: 50.0000 mg | ORAL_TABLET | Freq: Four times a day (QID) | ORAL | Status: DC | PRN

## 2021-04-05 MED ORDER — PROPOFOL 10 MG/ML IV BOLUS
INTRAVENOUS | Status: AC
  Filled 2021-04-05: qty 20

## 2021-04-05 MED ORDER — BUPIVACAINE HCL 0.25 % IJ SOLN
INTRAMUSCULAR | Status: AC
  Filled 2021-04-05: qty 1

## 2021-04-05 MED ORDER — VANCOMYCIN HCL 1000 MG IV SOLR
INTRAVENOUS | Status: AC
  Filled 2021-04-05: qty 1000

## 2021-04-05 MED ORDER — PHENYLEPHRINE HCL (PRESSORS) 10 MG/ML IV SOLN
INTRAVENOUS | Status: AC
  Filled 2021-04-05: qty 1

## 2021-04-05 MED ORDER — FLEET ENEMA 7-19 GM/118ML RE ENEM: 1.0000 | ENEMA | Freq: Once | RECTAL | Status: DC | PRN

## 2021-04-05 MED ORDER — PROPOFOL 10 MG/ML IV BOLUS
INTRAVENOUS | Status: DC | PRN
  Administered 2021-04-05: 200 mg via INTRAVENOUS

## 2021-04-05 MED ORDER — ONDANSETRON HCL 4 MG/2ML IJ SOLN: 4.0000 mg | Freq: Four times a day (QID) | INTRAMUSCULAR | Status: DC | PRN

## 2021-04-05 MED ORDER — MORPHINE SULFATE (PF) 2 MG/ML IV SOLN: 0.5000 mg | INTRAVENOUS | Status: DC | PRN

## 2021-04-05 MED ORDER — HYDROMORPHONE HCL 1 MG/ML IJ SOLN: 0.2500 mg | INTRAMUSCULAR | Status: DC | PRN

## 2021-04-05 MED ORDER — FLUTICASONE FUROATE-VILANTEROL 200-25 MCG/INH IN AEPB
1.0000 | INHALATION_SPRAY | Freq: Every day | RESPIRATORY_TRACT | Status: DC
  Administered 2021-04-06 – 2021-04-08 (×3): 1 via RESPIRATORY_TRACT
  Filled 2021-04-05: qty 28

## 2021-04-05 MED ORDER — DOCUSATE SODIUM 100 MG PO CAPS
100.0000 mg | ORAL_CAPSULE | Freq: Two times a day (BID) | ORAL | Status: DC
  Administered 2021-04-05 – 2021-04-07 (×4): 100 mg via ORAL
  Filled 2021-04-05 (×5): qty 1

## 2021-04-05 MED ORDER — CEFAZOLIN IN SODIUM CHLORIDE 3-0.9 GM/100ML-% IV SOLN
3.0000 g | INTRAVENOUS | Status: AC
  Administered 2021-04-05: 3 g via INTRAVENOUS
  Filled 2021-04-05: qty 100

## 2021-04-05 MED ORDER — METHOCARBAMOL 500 MG PO TABS: 500.0000 mg | ORAL_TABLET | Freq: Four times a day (QID) | ORAL | Status: DC | PRN

## 2021-04-05 MED ORDER — ACETAMINOPHEN 325 MG PO TABS
325.0000 mg | ORAL_TABLET | Freq: Four times a day (QID) | ORAL | Status: DC | PRN
Start: 2021-04-06 — End: 2021-04-08

## 2021-04-05 MED ORDER — METOCLOPRAMIDE HCL 5 MG/ML IJ SOLN: 5.0000 mg | Freq: Three times a day (TID) | INTRAMUSCULAR | Status: DC | PRN

## 2021-04-05 MED ORDER — MEPERIDINE HCL 50 MG/ML IJ SOLN: 6.2500 mg | INTRAMUSCULAR | Status: DC | PRN

## 2021-04-05 MED ORDER — OXYCODONE HCL 5 MG/5ML PO SOLN: 5.0000 mg | Freq: Once | ORAL | Status: DC | PRN

## 2021-04-05 MED ORDER — ACETAMINOPHEN 10 MG/ML IV SOLN
INTRAVENOUS | Status: DC | PRN
  Administered 2021-04-05 (×2): 1000 mg via INTRAVENOUS

## 2021-04-05 MED ORDER — LACTATED RINGERS IV SOLN: INTRAVENOUS | Status: DC

## 2021-04-05 MED ORDER — SODIUM CHLORIDE 0.9 % IV SOLN: INTRAVENOUS | Status: DC

## 2021-04-05 MED ORDER — PHENYLEPHRINE 40 MCG/ML (10ML) SYRINGE FOR IV PUSH (FOR BLOOD PRESSURE SUPPORT)
PREFILLED_SYRINGE | INTRAVENOUS | Status: DC | PRN
  Administered 2021-04-05: 40 ug via INTRAVENOUS
  Administered 2021-04-05 (×4): 120 ug via INTRAVENOUS

## 2021-04-05 MED ORDER — PHENOL 1.4 % MT LIQD: 1.0000 | OROMUCOSAL | Status: DC | PRN

## 2021-04-05 MED ORDER — FENTANYL CITRATE (PF) 100 MCG/2ML IJ SOLN
INTRAMUSCULAR | Status: DC | PRN
  Administered 2021-04-05 (×4): 50 ug via INTRAVENOUS
  Administered 2021-04-05: 25 ug via INTRAVENOUS

## 2021-04-05 MED ORDER — POLYETHYLENE GLYCOL 3350 17 G PO PACK: 17.0000 g | PACK | Freq: Every day | ORAL | Status: DC | PRN

## 2021-04-05 MED ORDER — TRIAMTERENE-HCTZ 37.5-25 MG PO TABS
1.0000 | ORAL_TABLET | Freq: Every day | ORAL | Status: DC
  Filled 2021-04-05: qty 1

## 2021-04-05 MED ORDER — ASPIRIN EC 325 MG PO TBEC
325.0000 mg | DELAYED_RELEASE_TABLET | Freq: Every day | ORAL | Status: DC
  Administered 2021-04-06: 325 mg via ORAL

## 2021-04-05 MED ORDER — FLUCONAZOLE 100 MG PO TABS
100.0000 mg | ORAL_TABLET | Freq: Every day | ORAL | Status: DC
  Administered 2021-04-06 – 2021-04-08 (×3): 100 mg via ORAL
  Filled 2021-04-05 (×3): qty 1

## 2021-04-05 MED ORDER — METOCLOPRAMIDE HCL 5 MG PO TABS: 5.0000 mg | ORAL_TABLET | Freq: Three times a day (TID) | ORAL | Status: DC | PRN

## 2021-04-05 MED ORDER — METHOCARBAMOL 500 MG IVPB - SIMPLE MED
500.0000 mg | Freq: Four times a day (QID) | INTRAVENOUS | Status: DC | PRN
  Filled 2021-04-05: qty 50

## 2021-04-05 MED ORDER — PROMETHAZINE HCL 25 MG/ML IJ SOLN: 6.2500 mg | INTRAMUSCULAR | Status: DC | PRN

## 2021-04-05 MED ORDER — DILTIAZEM HCL ER COATED BEADS 240 MG PO CP24
240.0000 mg | ORAL_CAPSULE | Freq: Every day | ORAL | Status: DC
  Administered 2021-04-06: 240 mg via ORAL
  Filled 2021-04-05 (×2): qty 1

## 2021-04-05 SURGICAL SUPPLY — 45 items
BAG COUNTER SPONGE SURGICOUNT (BAG) IMPLANT
BAG SPEC THK2 15X12 ZIP CLS (MISCELLANEOUS) ×1
BAG SPNG CNTER NS LX DISP (BAG)
BAG SURGICOUNT SPONGE COUNTING (BAG)
BAG ZIPLOCK 12X15 (MISCELLANEOUS) ×3 IMPLANT
BNDG ELASTIC 6X5.8 VLCR STR LF (GAUZE/BANDAGES/DRESSINGS) ×3 IMPLANT
CLOSURE WOUND 1/2 X4 (GAUZE/BANDAGES/DRESSINGS) ×2
COVER SURGICAL LIGHT HANDLE (MISCELLANEOUS) ×3 IMPLANT
CUFF TOURN SGL QUICK 34 (TOURNIQUET CUFF) ×3
CUFF TRNQT CYL 34X4.125X (TOURNIQUET CUFF) ×1 IMPLANT
DRAPE U-SHAPE 47X51 STRL (DRAPES) ×3 IMPLANT
DRSG ADAPTIC 3X8 NADH LF (GAUZE/BANDAGES/DRESSINGS) ×3 IMPLANT
DRSG PAD ABDOMINAL 8X10 ST (GAUZE/BANDAGES/DRESSINGS) ×3 IMPLANT
DURAPREP 26ML APPLICATOR (WOUND CARE) ×3 IMPLANT
ELECT REM PT RETURN 15FT ADLT (MISCELLANEOUS) ×3 IMPLANT
EVACUATOR 1/8 PVC DRAIN (DRAIN) ×3 IMPLANT
GAUZE SPONGE 4X4 12PLY STRL (GAUZE/BANDAGES/DRESSINGS) ×3 IMPLANT
GLOVE SRG 8 PF TXTR STRL LF DI (GLOVE) ×2 IMPLANT
GLOVE SURG ENC MOIS LTX SZ6.5 (GLOVE) ×6 IMPLANT
GLOVE SURG ENC MOIS LTX SZ7.5 (GLOVE) ×3 IMPLANT
GLOVE SURG ENC MOIS LTX SZ8 (GLOVE) ×9 IMPLANT
GLOVE SURG LTX SZ8 (GLOVE) IMPLANT
GLOVE SURG POLYISO LF SZ7 (GLOVE) ×3 IMPLANT
GLOVE SURG UNDER POLY LF SZ7 (GLOVE) ×6 IMPLANT
GLOVE SURG UNDER POLY LF SZ8 (GLOVE) ×6
GOWN STRL REUS W/TWL LRG LVL3 (GOWN DISPOSABLE) ×12 IMPLANT
HANDPIECE INTERPULSE COAX TIP (DISPOSABLE) ×3
KIT BASIN OR (CUSTOM PROCEDURE TRAY) ×3 IMPLANT
KIT TURNOVER KIT A (KITS) ×3 IMPLANT
MANIFOLD NEPTUNE II (INSTRUMENTS) ×3 IMPLANT
PACK TOTAL KNEE CUSTOM (KITS) ×3 IMPLANT
PADDING CAST COTTON 6X4 STRL (CAST SUPPLIES) ×6 IMPLANT
PENCIL SMOKE EVACUATOR (MISCELLANEOUS) ×2 IMPLANT
PROTECTOR NERVE ULNAR (MISCELLANEOUS) ×5 IMPLANT
SET HNDPC FAN SPRY TIP SCT (DISPOSABLE) ×1 IMPLANT
STAPLER VISISTAT 35W (STAPLE) ×2 IMPLANT
STRIP CLOSURE SKIN 1/2X4 (GAUZE/BANDAGES/DRESSINGS) ×4 IMPLANT
SUT ETHILON 2 0 PS N (SUTURE) ×2 IMPLANT
SUT MNCRL AB 4-0 PS2 18 (SUTURE) ×1 IMPLANT
SUT STRATAFIX 0 PDS 27 VIOLET (SUTURE) ×3
SUT VIC AB 2-0 CT1 27 (SUTURE) ×12
SUT VIC AB 2-0 CT1 TAPERPNT 27 (SUTURE) ×3 IMPLANT
SUTURE STRATFX 0 PDS 27 VIOLET (SUTURE) ×1 IMPLANT
SWAB COLLECTION DEVICE MRSA (MISCELLANEOUS) ×3 IMPLANT
SWAB CULTURE ESWAB REG 1ML (MISCELLANEOUS) ×3 IMPLANT

## 2021-04-05 NOTE — Transfer of Care (Signed)
Immediate Anesthesia Transfer of Care Note  Patient: [name]  Procedure(s) Performed: Procedure(s): IRRIGATION AND DEBRIDEMENT KNEE (Right)  Patient Location: PACU  Anesthesia Type:General  Level of Consciousness: Alert, Awake, Oriented  Airway & Oxygen Therapy: Patient Spontanous Breathing  Post-op Assessment: Report given to RN  Post vital signs: Reviewed and stable  Last Vitals:  Vitals:   04/05/21 1400  BP: 101/64  Pulse: 89  Resp: 16  Temp: 36.8 C  SpO2: 99%    Complications: No apparent anesthesia complications

## 2021-04-05 NOTE — Anesthesia Preprocedure Evaluation (Signed)
Anesthesia Evaluation  Patient identified by MRN, date of birth, ID band Patient awake    History of Anesthesia Complications (+) PONV and history of anesthetic complications  Airway Mallampati: III  TM Distance: >3 FB Neck ROM: Full    Dental no notable dental hx.    Pulmonary asthma , sleep apnea and Continuous Positive Airway Pressure Ventilation , former smoker,    Pulmonary exam normal breath sounds clear to auscultation       Cardiovascular Exercise Tolerance: Good hypertension, Pt. on medications Normal cardiovascular exam Rhythm:Regular Rate:Normal     Neuro/Psych    GI/Hepatic Neg liver ROS, GERD  ,  Endo/Other  Morbid obesity  Renal/GU negative Renal ROS  negative genitourinary   Musculoskeletal  (+) Arthritis , Osteoarthritis,    Abdominal (+) + obese,   Peds negative pediatric ROS (+)  Hematology  (+) anemia ,   Anesthesia Other Findings   Reproductive/Obstetrics negative OB ROS                             Anesthesia Physical  Anesthesia Plan  ASA: 3 and emergent  Anesthesia Plan: General   Post-op Pain Management:    Induction: Intravenous  PONV Risk Score and Plan: 3  Airway Management Planned: LMA  Additional Equipment: None  Intra-op Plan:   Post-operative Plan: Extubation in OR  Informed Consent: I have reviewed the patients History and Physical, chart, labs and discussed the procedure including the risks, benefits and alternatives for the proposed anesthesia with the patient or authorized representative who has indicated his/her understanding and acceptance.       Plan Discussed with: CRNA, Anesthesiologist and Surgeon  Anesthesia Plan Comments:         Anesthesia Quick Evaluation

## 2021-04-05 NOTE — Op Note (Signed)
NAMERIEN, MARLAND MEDICAL RECORD NO: [id number] ACCOUNT NO: [id number] DATE OF BIRTH: [birth year] FACILITY: [id number] PHYSICIAN: Gus Rankin. Rekisha Welling, MD  Operative Report   DATE OF PROCEDURE: 04/05/2021  PREOPERATIVE DIAGNOSIS:  Septic arthritis, right knee.  POSTOPERATIVE DIAGNOSIS:  Septic arthritis, right knee.  PROCEDURE:  Right knee irrigation and debridement.  SURGEON:  Gus Rankin. Renesmee Raine, MD  ASSISTANT:  Arther Abbott, PA-C  ANESTHESIA:  General.  ESTIMATED BLOOD LOSS:  50 mL.  DRAINS:  Hemovac x1.  COMPLICATIONS:  None.  CONDITION:  Stable to recovery.  BRIEF CLINICAL NOTE:  The patient is a 60 year old male with a long complex history in regards to his right knee.  He had reimplantation of total knee arthroplasty performed approximately 3 months ago.  24-36 hours ago, he began having drainage from a  pinpoint area on the inferior aspect of the incision line.  He began feeling ill and presented to the office yesterday afternoon with seropurulent drainage from fluid, which was aspirated from his subcutaneous space.  He is subsequently brought to the  hospital today for formal irrigation and debridement of the knee.  PROCEDURE IN DETAIL:  After successful administration of general anesthetic, a tourniquet was placed on the right thigh.  Right lower extremity was prepped and draped in the usual sterile fashion.  Midline incision was made with a 10 blade through the  subcutaneous tissue.  There was a tremendous amount of seropurulent fluid in the subcutaneous space.  It was sent for Gram stain, culture and sensitivity.  The subcutaneous space was thoroughly irrigated, and any abnormal tissue was removed with a  curette and rongeur.  This got back to normal-appearing tissue in the subcutaneous space.  There was a communication with the joint superiorly.  There was fluid coming from the joint also.  I made a medial arthrotomy and evacuated the fluid that was in  the  joint.  It was a miniscule amount, but I believe we just decompressed into the subcutaneous tissues and that is why there was not much in the joint itself.  Thorough synovectomy was performed.  Joint was then irrigated with 3 liters of saline using  pulsatile lavage.  I did not remove the polyethylene as it was a hinged implant and that would require replacing both the hinge and the polyethylene.  I felt that not was necessary given the acuity of this.  We then irrigated with 500 mL with IrriSept  and then once that was washed out irrigated again with Betadine and saline.  An additional 3 liters of saline was irrigated using pulsatile lavage.  The tissue debridement once again was done with a rongeur and curette and that was done in the  subcutaneous space.  A rongeur and cautery were used to do the synovectomy in the joint.  The irrigation was completed and the arthrotomy was closed with running 0 Stratafix suture.  The drain was placed in the subcutaneous space and subcutaneous closed  with interrupted 2-0 Vicryl.  Skin was closed with staples.  Drain was sewn into the skin with interrupted 2-0 nylon.  Incisions were cleaned and dried, and a bulky sterile dressing was applied.  He was then placed into a knee immobilizer.  He was  subsequently awakened and transported to recovery in stable condition.   ROH D: 04/05/2021 5:17:57 pm T: 04/05/2021 9:22:00 pm  JOB: [id number]/ [id number]

## 2021-04-05 NOTE — Discharge Instructions (Addendum)
From internal medicine: We adjusted your blood pressure medication. First off, obtain a blood pressure cuff at home and check your pressure and heart rate daily. I have decreased Cardizem to 180 mg daily from 240 mg daily. I have stopped your Toprol. Follow up with your primary care for any further adjustment of meds as needed. If your heart rate does continue to increase as your recover, you might need to go back on some of your Toprol as well.   ------------------------------------------------------------------------------------------------------------------------------------------------------------------------------------------------------------------------------------------    Ollen Gross, MD [facility] [address] [place], [place] [address] [phone]  POSTOPERATIVE DIRECTIONS  BLOOD CLOT PREVENTION Take a 325 mg Aspirin once a day for three weeks following surgery. Then take an 81 mg Aspirin once a day for three weeks. Then discontinue Aspirin. You may resume your vitamins/supplements upon discharge from the hospital. Do not take any NSAIDs (Advil, Aleve, Ibuprofen, Meloxicam, etc.) until you have discontinued the 325 mg Aspirin.  HOME CARE INSTRUCTIONS  Remove items at home which could result in a fall. This includes throw rugs or furniture in walking pathways.  ICE to the affected knee as much as tolerated. Icing helps control swelling. If the swelling is well controlled you will be more comfortable and rehab easier. Continue to use ice on the knee for pain and swelling from surgery. You may notice swelling that will progress down to the foot and ankle. This is normal after surgery. Elevate the leg when you are not up walking on it.    Continue to use the breathing machine which will help keep your temperature down. It is common for your temperature to cycle up and down following surgery, especially at night when you are not up  moving around and exerting yourself. The breathing machine keeps your lungs expanded and your temperature down.   DIET You may resume your previous home diet once you are discharged from the hospital.  TED HOSE STOCKINGS Wear the elastic stockings on both legs for three weeks following surgery during the day. You may remove them at night for sleeping.  WEIGHT BEARING Weight bearing as tolerated with assist device (walker, cane, etc) as directed, use it as long as suggested by your surgeon or therapist, typically at least 4-6 weeks.  POSTOPERATIVE CONSTIPATION PROTOCOL Constipation - defined medically as fewer than three stools per week and severe constipation as less than one stool per week.  One of the most common issues patients have following surgery is constipation.  Even if you have a regular bowel pattern at home, your normal regimen is likely to be disrupted due to multiple reasons following surgery.  Combination of anesthesia, postoperative narcotics, change in appetite and fluid intake all can affect your bowels.  In order to avoid complications following surgery, here are some recommendations in order to help you during your recovery period.  Colace (docusate) - Pick up an over-the-counter form of Colace or another stool softener and take twice a day as long as you are requiring postoperative pain medications.  Take with a full glass of water daily.  If you experience loose stools or diarrhea, hold the colace until you stool forms back up. If your symptoms do not get better within 1 week or if they get worse, check with your doctor. Dulcolax (bisacodyl) - Pick up over-the-counter and take as directed by the product packaging as needed to assist with the movement of your bowels.  Take with a full glass of water.  Use this product as needed if not relieved by Colace only.  MiraLax (polyethylene glycol) - Pick up over-the-counter to have on hand. MiraLax is a solution that will increase the  amount of water in your bowels to assist with bowel movements.  Take as directed and can mix with a glass of water, juice, soda, coffee, or tea. Take if you go more than two days without a movement. Do not use MiraLax more than once per day. Call your doctor if you are still constipated or irregular after using this medication for 7 days in a row.  If you continue to have problems with postoperative constipation, please contact the office for further assistance and recommendations.  If you experience "the worst abdominal pain ever" or develop nausea or vomiting, please contact the office immediatly for further recommendations for treatment.  ITCHING If you experience itching with your medications, try taking only a single pain pill, or even half a pain pill at a time.  You can also use Benadryl over the counter for itching or also to help with sleep.   MEDICATIONS See your medication summary on the "After Visit Summary" that the nursing staff will review with you prior to discharge.  You may have some home medications which will be placed on hold until you complete the course of blood thinner medication.  It is important for you to complete the blood thinner medication as prescribed by your surgeon.  Continue your approved medications as instructed at time of discharge.  PRECAUTIONS If you experience chest pain or shortness of breath - call 911 immediately for transfer to the hospital emergency department.  If you develop a fever greater that 101 F, purulent drainage from wound, increased redness or drainage from wound, foul odor from the wound/dressing, or calf pain - CONTACT YOUR SURGEON.                                                   FOLLOW-UP APPOINTMENTS Make sure you keep all of your appointments after your operation with your surgeon and caregivers. You should call the office at the above phone number and make an appointment for approximately two weeks after the date of your surgery or on  the date instructed by your surgeon outlined in the "After Visit Summary".   POST-OPERATIVE OPIOID TAPER INSTRUCTIONS: It is important to wean off of your opioid medication as soon as possible. If you do not need pain medication after your surgery it is ok to stop day one. Opioids include: Codeine, Hydrocodone(Norco, Vicodin), Oxycodone(Percocet, oxycontin) and hydromorphone amongst others.  Long term and even short term use of opiods can cause: Increased pain response Dependence Constipation Depression Respiratory depression And more.  Withdrawal symptoms can include Flu like symptoms Nausea, vomiting And more Techniques to manage these symptoms Hydrate well Eat regular healthy meals Stay active Use relaxation techniques(deep breathing, meditating, yoga) Do Not substitute Alcohol to help with tapering If you have been on opioids for less than two weeks and do not have pain than it is ok to stop all together.  Plan to wean off of opioids This plan should start within one week post op of your joint replacement. Maintain the same interval or time between taking each dose and first decrease the dose.  Cut the total daily intake of opioids by one tablet each day  Next start to increase the time between doses. The last dose that should be eliminated is the evening dose.   IF YOU ARE TRANSFERRED TO A SKILLED REHAB FACILITY If the patient is transferred to a skilled rehab facility following release from the hospital, a list of the current medications will be sent to the facility for the patient to continue.  When discharged from the skilled rehab facility, please have the facility set up the patient's Home Health Physical Therapy prior to being released. Also, the skilled facility will be responsible for providing the patient with their medications at time of release from the facility to include their pain medication, the muscle relaxants, and their blood thinner medication. If the patient is  still at the rehab facility at time of the two week follow up appointment, the skilled rehab facility will also need to assist the patient in arranging follow up appointment in our office and any transportation needs.  MAKE SURE YOU:  Understand these instructions.  Get help right away if you are not doing well or get worse.   Pick up stool softner and laxative for home use following surgery while on pain medications. Do not submerge incision under water. Please use good hand washing techniques while changing dressing each day. May shower starting three days after surgery. Please use a clean towel to pat the incision dry following showers. Continue to use ice for pain and swelling after surgery. Do not use any lotions or creams on the incision until instructed by your surgeon.

## 2021-04-05 NOTE — Anesthesia Procedure Notes (Signed)
Procedure Name: LMA Insertion Date/Time: 04/05/2021 3:48 PM Performed by: Nelle Don, CRNA Pre-anesthesia Checklist: Patient identified, Emergency Drugs available, Suction available and Patient being monitored Patient Re-evaluated:Patient Re-evaluated prior to induction Oxygen Delivery Method: Circle system utilized Preoxygenation: Pre-oxygenation with 100% oxygen Induction Type: IV induction LMA: LMA with gastric port inserted LMA Size: 4.0 Number of attempts: 2 Dental Injury: Teeth and Oropharynx as per pre-operative assessment  Comments: LMA 4 initially placed with success. Leak became greater and then switched to LMA 4 gastric port.

## 2021-04-05 NOTE — H&P (Signed)
ADMISSION H&P  Subjective:  Chief Complaint: Right knee pain.  HPI: [name], 60 y.o. male, s/p right hinge knee, presented to clinic yesterday for follow-up. He has had increased discomfort and developed some drainage today through the anterior aspect of that incision. He has felt ill with no documented fevers, but he has had sweats. Lupita Leash (patient's wife) called in earlier that day and we had Lew come in immediately to be evaluated.   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   Cellulitis and abscess of trunk 02/15/2014    Class: Acute   Anemia 02/15/2014    Class: Chronic   Cellulitis of leg, right 01/13/2013    Class: Acute   Rheumatoid arthritis (HCC) 01/13/2013    Class: Chronic   Obstructive sleep apnea 01/13/2013    Class: Chronic   Chronic venous insufficiency 01/13/2013    Class: Chronic    Past Medical History:  Diagnosis Date   Allergy    OCC    Anemia    hx of   Asthma    Cataract    small- forming    Cellulitis of lower extremity    "usually RLL; this time it's got up to my lower abdoment" (02/11/2014)-series of antibiotics completed 02-28-14   Diverticulosis    Edema    legs   GERD (gastroesophageal reflux disease)    Hypertension    Infection of right knee (HCC)    OSA (obstructive sleep apnea)    does not use cpap    Pneumonia    20+ years ago   PONV (postoperative nausea and  vomiting)    Rheumatoid arthritis (HCC) dx'd ~ 1977   S/P PICC central line placement 03-16-14   right upper arm remains intact.05-11-14 removed 1 week ago.   Sleep apnea    no cpap     Past Surgical History:  Procedure Laterality Date   COLONOSCOPY  2012   EXCISIONAL HEMORRHOIDECTOMY     EXCISIONAL TOTAL KNEE ARTHROPLASTY WITH ANTIBIOTIC SPACERS Right 03/18/2014   Procedure: RIGHT KNEE RESECTION ARTHROPLASTY WITH ANTIBIOTIC SPACERS;  Surgeon: Loanne Drilling, MD;  Location: [facility];  Service: Orthopedics;  Laterality: Right;   EXCISIONAL TOTAL KNEE ARTHROPLASTY WITH ANTIBIOTIC SPACERS Right 06/22/2020   Procedure: Right knee resection arthroplasty, antibiotic spacer;  Surgeon: Ollen Gross, MD;  Location: [facility];  Service: Orthopedics;  Laterality: Right;    FOREIGN BODY REMOVAL Left    "BB" removal above left eye-teen yrs.   HERNIA REPAIR     I & D KNEE WITH POLY EXCHANGE Right 04/25/2016   Procedure: RIGHT KNEE IRRIGATION AND DEBRIDEMENT WITH POLY EXCHANGE;  Surgeon: Ollen Gross, MD;  Location: [facility];  Service: Orthopedics;  Laterality: Right;   I & D KNEE WITH POLY EXCHANGE Right 10/01/2016   Procedure: IRRIGATION AND DEBRIDEMENT KNEE WITH POLY EXCHANGE;  Surgeon: Ollen Gross, MD;  Location: [facility];  Service: Orthopedics;  Laterality: Right;   I &  D KNEE WITH POLY EXCHANGE Right 08/26/2017   Procedure: IRRIGATION AND DEBRIDEMENT KNEE WITH POLY EXCHANGE;  Surgeon: Ollen Gross, MD;  Location: [facility];  Service: Orthopedics;  Laterality: Right;   I & D KNEE WITH POLY EXCHANGE Right 11/16/2019   Procedure: IRRIGATION AND DEBRIDEMENT KNEE WITH POLY EXCHANGE;  Surgeon: Ollen Gross, MD;  Location: [facility];  Service: Orthopedics;  Laterality: Right;    I & D KNEE WITH POLY EXCHANGE Right 02/22/2020   Procedure: Right knee irrigation and debridement;  Surgeon: Ollen Gross, MD;  Location: [facility];  Service: Orthopedics;  Laterality: Right;    INCISION AND DRAINAGE Right  09/28/2014   Procedure: INCISION AND DRAINAGE RIGHT KNEE;  Surgeon: Loanne Drilling, MD;  Location: [facility];  Service: Orthopedics;  Laterality: Right;   KNEE BURSECTOMY Right 03/13/2017   Procedure: RIGHT KNEE PREPATELLAR BURSECTOMY;  Surgeon: Ollen Gross, MD;  Location: [facility];  Service: Orthopedics;  Laterality: Right;   picc line placement Right    right upper arm   REIMPLANTATION OF TOTAL KNEE Right 12/07/2020   Procedure: REIMPLANTATION OF RIGHT TOTAL KNEE;  Surgeon: Ollen Gross, MD;  Location: [facility];  Service: Orthopedics;  Laterality: Right;  2 hrs   REPLACEMENT TOTAL HIP W/  RESURFACING IMPLANTS Bilateral 01/2001; 08/2010   "left; right"   REPLACEMENT TOTAL KNEE BILATERAL Bilateral 10/1991; 10/2006   "right; left"   REVISION TOTAL KNEE ARTHROPLASTY Right 2012   SPIGELIAN HERNIA Right 12/04/2015   Procedure: INCARCERATED SPIGELIAN HERNIA REPAIR WITH MESH;  Surgeon: Violeta Gelinas, MD;  Location: [facility];  Service: General;  Laterality: Right;   TOTAL KNEE ARTHROPLASTY Right 05/21/2014   Procedure: RIGHT KNEE ARTHROPLASTY REINPLANTATION;  Surgeon: Loanne Drilling, MD;  Location: [facility];  Service: Orthopedics;  Laterality: Right;    Prior to Admission medications   Medication Sig Start Date End Date Taking? Authorizing Provider  aspirin EC 325 MG EC tablet Take 1 tablet (325 mg total) by mouth 2 (two) times daily. Then take one 81 mg aspirin once a day for three weeks. Then discontinue aspirin. 12/09/20   Nelia Shi D, PA-C  diltiazem (CARDIZEM CD) 240 MG 24 hr capsule Take 240 mg by mouth daily.  11/12/10   [provider]  diphenoxylate-atropine (LOMOTIL) 2.5-0.025 MG/5ML liquid Take 5 mLs by mouth 4 (four) times daily as needed for diarrhea or loose stools. 07/02/20   Randall Hiss, MD  fluconazole (DIFLUCAN) 100 MG tablet Take 100 mg by mouth daily. 10/10/20   [provider]  Fluticasone-Salmeterol (ADVAIR) 250-50 MCG/DOSE AEPB Inhale 1 puff into the lungs  daily.    [provider]  HYDROMET 5-1.5 MG/5ML syrup Take by mouth. 09/13/20   [provider]  Ipratropium-Albuterol (COMBIVENT RESPIMAT) 20-100 MCG/ACT AERS respimat Inhale 1 puff into the lungs every 6 (six) hours as needed for wheezing or shortness of breath.    [provider]  losartan (COZAAR) 100 MG tablet Take 100 mg by mouth daily.  11/12/10   [provider]  methocarbamol (ROBAXIN) 500 MG tablet Take 1 tablet (500 mg total) by mouth every 6 (six) hours as needed for muscle spasms. 12/09/20   Nelia Shi D, PA-C  methotrexate (RHEUMATREX) 2.5 MG tablet Take 10 mg by mouth every Monday. Caution:Chemotherapy. Protect from light.    [provider]  metoprolol succinate (TOPROL-XL) 100 MG 24 hr tablet Take 100 mg by mouth daily. Take with or immediately following a meal.  [provider]  nystatin (MYCOSTATIN/NYSTOP) powder Apply 1 application topically 3 (three) times daily. 07/02/20   Randall Hiss, MD  oxyCODONE (OXY IR/ROXICODONE) 5 MG immediate release tablet Take 1-2 tablets (5-10 mg total) by mouth every 6 (six) hours as needed for moderate pain or severe pain. 12/09/20   Nelia Shi D, PA-C  RABEprazole (ACIPHEX) 20 MG tablet Take 20 mg by mouth daily before breakfast.     [provider]  Semaglutide (RYBELSUS) 3 MG TABS Take by mouth daily.    [provider]  traMADol (ULTRAM) 50 MG tablet Take 1-2 tablets (50-100 mg total) by mouth every 6 (six) hours as needed for moderate pain. 12/09/20   Alyssa Grove, PA-C  triamterene-hydrochlorothiazide (MAXZIDE-25) 37.5-25 MG tablet Take 1 tablet by mouth daily.    [provider]    Allergies  Allergen Reactions   Avelox [Moxifloxacin Hcl In Nacl] Hives, Shortness Of Breath and Itching    Tolerates Cipro   Doxycycline Itching   Rocephin [Ceftriaxone Sodium In Dextrose] Hives and Itching    Patient tolerated ancef 3g on 12/07/20   Sulfa  Antibiotics Itching   Vancomycin Hives and Itching   E.E.S. [Erythromycin] Rash   Erythrocin Rash   Penicillins Rash    Tolerated augmentin    Sulfasalazine Rash    Social History   Socioeconomic History   Marital status: Married    Spouse name: Not on file   Number of children: 1   Years of education: 14   Highest education level: Not on file  Occupational History   Not on file  Tobacco Use   Smoking status: Former    Packs/day: 1.00    Years: 3.00    Pack years: 3.00    Types: Cigarettes    Quit date: 03/16/1981    Years since quitting: 40.0   Smokeless tobacco: Current    Types: Snuff   Tobacco comments:    "quit smoking cigarettes in the 1980's"  Vaping Use   Vaping Use: Never used  Substance and Sexual Activity   Alcohol use: No   Drug use: No   Sexual activity: Yes    Partners: Male  Other Topics Concern   Not on file  Social History Narrative   Not on file   Social Determinants of Health   Financial Resource Strain: Not on file  Food Insecurity: Not on file  Transportation Needs: Not on file  Physical Activity: Not on file  Stress: Not on file  Social Connections: Not on file  Intimate Partner Violence: Not on file    Tobacco Use: High Risk   Smoking Tobacco Use: Former   Smokeless Tobacco Use: Current   Social History   Substance and Sexual Activity  Alcohol Use No    Family History  Problem Relation Age of Onset   Colon cancer Mother 54       passed age 1 - stage 4    Hypertension Mother    Diabetes Mellitus II Paternal Grandmother    Colon polyps Neg Hx    Esophageal cancer Neg Hx    Stomach cancer Neg Hx    Rectal cancer Neg Hx     Review of Systems  Constitutional:  Positive for chills and fever (No documented fever, but sweats).  HENT:  Negative for congestion, sore throat and tinnitus.   Eyes:  Negative for double vision, photophobia and pain.  Respiratory:  Negative for cough, shortness of breath and wheezing.  Cardiovascular:  Negative for chest pain, palpitations and orthopnea.  Gastrointestinal:  Negative for heartburn, nausea and vomiting.  Genitourinary:  Negative for dysuria, frequency and urgency.  Musculoskeletal:  Positive for joint pain.  Neurological:  Negative for dizziness, weakness and headaches.   Objective:  Physical Exam: The patient is a pleasant, well-developed male, alert and oriented, in no apparent distress. T-97.8 degrees F  Right Lower Extremity Exam:  Significant swelling in the subcutaneous tissues about the knee.  There is a small amount of pinkish discoloration inferiorly, but no warmth or erythema.  Assessment/Plan:  We discussed the patient's presenting complaints, history, and treatment options. He is definitely having something acutely going on now. He is off antibiotics and had been on chronic amoxicillin, so he is going to start that up tonight. We are going to take him to surgery tomorrow for an irrigation, debridement, and closure over drains. We will get infectious disease involved also. More than likely, he is going to need IV antibiotics for this. He is not a candidate for another 2-stage revision as he already has a hinged implant. He does know that if this gets chronically infected, that is probably going to lead to amputation. I do feel we are going to be able to treat this and not go to that extreme. We will see him tomorrow for irrigation and debridement. They understand and want to proceed as planned.  Arther Abbott, PA-C Orthopedic Surgery [facility]

## 2021-04-05 NOTE — Interval H&P Note (Signed)
History and Physical Interval Note:  04/05/2021 2:13 PM  [name]  has presented today for surgery, with the diagnosis of Right knee septic arthritis.  The various methods of treatment have been discussed with the patient and family. After consideration of risks, benefits and other options for treatment, the patient has consented to  Procedure(s): IRRIGATION AND DEBRIDEMENT KNEE (Right) as a surgical intervention.  The patient's history has been reviewed, patient examined, no change in status, stable for surgery.  I have reviewed the patient's chart and labs.  Questions were answered to the patient's satisfaction.     [name]

## 2021-04-05 NOTE — Anesthesia Postprocedure Evaluation (Signed)
Anesthesia Post Note  Patient: [name]  Procedure(s) Performed: IRRIGATION AND DEBRIDEMENT KNEE (Right: Knee)     Patient location during evaluation: PACU Anesthesia Type: General Level of consciousness: awake and alert Pain management: pain level controlled Vital Signs Assessment: post-procedure vital signs reviewed and stable Respiratory status: spontaneous breathing, nonlabored ventilation and respiratory function stable Cardiovascular status: blood pressure returned to baseline and stable Postop Assessment: no apparent nausea or vomiting Anesthetic complications: no   No notable events documented.  Last Vitals:  Vitals:   04/05/21 1745 04/05/21 1812  BP: (!) 98/54 108/60  Pulse: 70 71  Resp: 20 18  Temp: 36.8 C   SpO2: 100% 100%    Last Pain:  Vitals:   04/05/21 1745  TempSrc:   PainSc: 0-No pain                 Lowella Curb

## 2021-04-05 NOTE — Brief Op Note (Signed)
04/05/2021  5:12 PM  PATIENT:  [name]  60 y.o. male  PRE-OPERATIVE DIAGNOSIS:  Right knee septic arthritis  POST-OPERATIVE DIAGNOSIS:  Right knee septic arthritis  PROCEDURE:  Procedure(s): IRRIGATION AND DEBRIDEMENT KNEE (Right)  SURGEON:  Surgeon(s) and Role:    Ollen Gross, MD - Primary  PHYSICIAN ASSISTANT:   ASSISTANTS: Arther Abbott, PA-C   ANESTHESIA:   general  EBL:  50 ml   DRAINS: (Medium) Hemovact drain(s) in the right knee with  Suction Open   LOCAL MEDICATIONS USED:  NONE  SPECIMEN:  Aspirate  DISPOSITION OF SPECIMEN:   Microbiology lab  COUNTS:  YES  DICTATION: .Other Dictation: Dictation Number [id number]  PLAN OF CARE: Admit to inpatient   PATIENT DISPOSITION:  PACU - hemodynamically stable.

## 2021-04-05 NOTE — Plan of Care (Signed)
Initiated COVID 19 and General Care Plan

## 2021-04-06 ENCOUNTER — Encounter (HOSPITAL_COMMUNITY): Payer: Self-pay | Admitting: Orthopedic Surgery

## 2021-04-06 ENCOUNTER — Inpatient Hospital Stay (HOSPITAL_COMMUNITY): Payer: 59

## 2021-04-06 ENCOUNTER — Inpatient Hospital Stay: Payer: Self-pay

## 2021-04-06 DIAGNOSIS — R001 Bradycardia, unspecified: Secondary | ICD-10-CM | POA: Diagnosis not present

## 2021-04-06 DIAGNOSIS — Y831 Surgical operation with implant of artificial internal device as the cause of abnormal reaction of the patient, or of later complication, without mention of misadventure at the time of the procedure: Secondary | ICD-10-CM | POA: Diagnosis present

## 2021-04-06 DIAGNOSIS — Z882 Allergy status to sulfonamides status: Secondary | ICD-10-CM | POA: Diagnosis not present

## 2021-04-06 DIAGNOSIS — M00861 Arthritis due to other bacteria, right knee: Secondary | ICD-10-CM | POA: Diagnosis not present

## 2021-04-06 DIAGNOSIS — Z96653 Presence of artificial knee joint, bilateral: Secondary | ICD-10-CM | POA: Diagnosis present

## 2021-04-06 DIAGNOSIS — U071 COVID-19: Secondary | ICD-10-CM | POA: Diagnosis present

## 2021-04-06 DIAGNOSIS — K219 Gastro-esophageal reflux disease without esophagitis: Secondary | ICD-10-CM | POA: Diagnosis present

## 2021-04-06 DIAGNOSIS — T8453XA Infection and inflammatory reaction due to internal right knee prosthesis, initial encounter: Secondary | ICD-10-CM | POA: Diagnosis present

## 2021-04-06 DIAGNOSIS — M25561 Pain in right knee: Secondary | ICD-10-CM | POA: Diagnosis present

## 2021-04-06 DIAGNOSIS — M069 Rheumatoid arthritis, unspecified: Secondary | ICD-10-CM | POA: Diagnosis present

## 2021-04-06 DIAGNOSIS — Z883 Allergy status to other anti-infective agents status: Secondary | ICD-10-CM | POA: Diagnosis not present

## 2021-04-06 DIAGNOSIS — I10 Essential (primary) hypertension: Secondary | ICD-10-CM | POA: Diagnosis present

## 2021-04-06 DIAGNOSIS — Z6841 Body Mass Index (BMI) 40.0 and over, adult: Secondary | ICD-10-CM | POA: Diagnosis not present

## 2021-04-06 DIAGNOSIS — I890 Lymphedema, not elsewhere classified: Secondary | ICD-10-CM | POA: Diagnosis present

## 2021-04-06 DIAGNOSIS — J45909 Unspecified asthma, uncomplicated: Secondary | ICD-10-CM | POA: Diagnosis present

## 2021-04-06 DIAGNOSIS — Z888 Allergy status to other drugs, medicaments and biological substances status: Secondary | ICD-10-CM | POA: Diagnosis not present

## 2021-04-06 DIAGNOSIS — H269 Unspecified cataract: Secondary | ICD-10-CM | POA: Diagnosis present

## 2021-04-06 DIAGNOSIS — Z8701 Personal history of pneumonia (recurrent): Secondary | ICD-10-CM | POA: Diagnosis not present

## 2021-04-06 DIAGNOSIS — Z7951 Long term (current) use of inhaled steroids: Secondary | ICD-10-CM | POA: Diagnosis not present

## 2021-04-06 DIAGNOSIS — Z880 Allergy status to penicillin: Secondary | ICD-10-CM | POA: Diagnosis not present

## 2021-04-06 DIAGNOSIS — N179 Acute kidney failure, unspecified: Secondary | ICD-10-CM | POA: Diagnosis present

## 2021-04-06 DIAGNOSIS — Z87891 Personal history of nicotine dependence: Secondary | ICD-10-CM | POA: Diagnosis not present

## 2021-04-06 DIAGNOSIS — Z881 Allergy status to other antibiotic agents status: Secondary | ICD-10-CM | POA: Diagnosis not present

## 2021-04-06 DIAGNOSIS — M009 Pyogenic arthritis, unspecified: Secondary | ICD-10-CM | POA: Diagnosis present

## 2021-04-06 DIAGNOSIS — Z8249 Family history of ischemic heart disease and other diseases of the circulatory system: Secondary | ICD-10-CM | POA: Diagnosis not present

## 2021-04-06 DIAGNOSIS — G4733 Obstructive sleep apnea (adult) (pediatric): Secondary | ICD-10-CM | POA: Diagnosis present

## 2021-04-06 DIAGNOSIS — I159 Secondary hypertension, unspecified: Secondary | ICD-10-CM | POA: Diagnosis not present

## 2021-04-06 LAB — CBC
HCT: 32.4 % — ABNORMAL LOW (ref 39.0–52.0)
Hemoglobin: 10.1 g/dL — ABNORMAL LOW (ref 13.0–17.0)
MCH: 26.7 pg (ref 26.0–34.0)
MCHC: 31.2 g/dL (ref 30.0–36.0)
MCV: 85.7 fL (ref 80.0–100.0)
Platelets: 282 10*3/uL (ref 150–400)
RBC: 3.78 MIL/uL — ABNORMAL LOW (ref 4.22–5.81)
RDW: 19.2 % — ABNORMAL HIGH (ref 11.5–15.5)
WBC: 5.5 10*3/uL (ref 4.0–10.5)
nRBC: 0 % (ref 0.0–0.2)

## 2021-04-06 LAB — C-REACTIVE PROTEIN: CRP: 26.6 mg/dL — ABNORMAL HIGH (ref <1.0)

## 2021-04-06 LAB — SEDIMENTATION RATE: Sed Rate: 67 mm/h — ABNORMAL HIGH (ref 0–16)

## 2021-04-06 LAB — BASIC METABOLIC PANEL
Anion gap: 6 (ref 5–15)
BUN: 36 mg/dL — ABNORMAL HIGH (ref 6–20)
CO2: 23 mmol/L (ref 22–32)
Calcium: 7.9 mg/dL — ABNORMAL LOW (ref 8.9–10.3)
Chloride: 108 mmol/L (ref 98–111)
Creatinine, Ser: 2.93 mg/dL — ABNORMAL HIGH (ref 0.61–1.24)
GFR, Estimated: 24 mL/min — ABNORMAL LOW (ref >60)
Glucose, Bld: 116 mg/dL — ABNORMAL HIGH (ref 70–99)
Potassium: 4 mmol/L (ref 3.5–5.1)
Sodium: 137 mmol/L (ref 135–145)

## 2021-04-06 LAB — CK: Total CK: 37 U/L — ABNORMAL LOW (ref 49–397)

## 2021-04-06 LAB — ALBUMIN: Albumin: 2.7 g/dL — ABNORMAL LOW (ref 3.5–5.0)

## 2021-04-06 LAB — SARS CORONAVIRUS 2 (TAT 6-24 HRS): SARS Coronavirus 2: POSITIVE — AB

## 2021-04-06 MED ORDER — ASPIRIN EC 325 MG PO TBEC
325.0000 mg | DELAYED_RELEASE_TABLET | Freq: Two times a day (BID) | ORAL | Status: DC
  Administered 2021-04-06 – 2021-04-08 (×4): 325 mg via ORAL
  Filled 2021-04-06 (×5): qty 1

## 2021-04-06 MED ORDER — SODIUM CHLORIDE 0.9 % IV SOLN
2.0000 g | INTRAVENOUS | Status: DC
  Administered 2021-04-06 – 2021-04-08 (×3): 2 g via INTRAVENOUS
  Filled 2021-04-06: qty 2
  Filled 2021-04-06: qty 20
  Filled 2021-04-06: qty 2

## 2021-04-06 MED ORDER — SODIUM CHLORIDE 0.9 % IV SOLN
8.0000 mg/kg | Freq: Every day | INTRAVENOUS | Status: DC
  Administered 2021-04-06 – 2021-04-08 (×3): 800 mg via INTRAVENOUS
  Filled 2021-04-06 (×3): qty 16

## 2021-04-06 MED ORDER — SODIUM CHLORIDE 0.9 % IV SOLN: Freq: Once | INTRAVENOUS | Status: DC

## 2021-04-06 NOTE — TOC Progression Note (Signed)
Transition of Care [facility]) - Progression Note    Patient Details  Name: [name] MRN: [id number] Date of Birth: [birth year]  Transition of Care [facility]) CM/SW Contact  Geni Bers, RN Phone Number: 04/06/2021, 4:32 PM  Clinical Narrative:    Spoke with pt's wife concerning IV ABX. Pt use [facility], script need to be fax to [phone], office [phone], Joannie Springs, RN follows pt at home.     Expected Discharge Plan: Home w Home Health Services Barriers to Discharge: No Barriers Identified  Expected Discharge Plan and Services Expected Discharge Plan: Home w Home Health Services       Living arrangements for the past 2 months: Single Family Home                                       Social Determinants of Health (SDOH) Interventions    Readmission Risk Interventions No flowsheet data found.

## 2021-04-06 NOTE — Progress Notes (Addendum)
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 Will follow cultures peripherally and make changes as needed. Otherwise I will sign off for now.  A follow up with [facility] will be made  Rest of the management as per the primary team. Please call with questions or concerns.  Thank you for the consult  Odette Fraction, MD Infectious Disease Physician [facility] [address] [place], [place] [address] Phone: [phone]  Fax: [phone]  __________________________________________________________________________________________________________ HPI and Hospital Course: [name] is a 60 y.o. male y.o. male with a PMH of RA on Methotrexate 20mg  PO weekly , Bilateral Hip replacements and Bilateral knee replacements, RT TKA done in the 1990s with  revision in 2012 and serratia PJI in 2015- treated with ceftriaxone x 6 weeks after resection arthroplasty.  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 10-26-1972 after developing a vancomycin allergy. He completed 6 weeks and stopped after that.  With allergy history no oral continuation therapy was done. He  came back in December 2017 with knee pain, swelling c/w PJI. No OR cx growth and treated for 6 more weeks with daptomycin after I and D with polyexchange. Dr. January 2018 followed him closely and placed him on oral augmentin in February of 2018 and completed approx. 5 months of therapy( feb/2018-04/2017)   This was followed by further I and D with polyexchange in November of 2018 for recurrent rt knee effusion( OR Cx No growth) and feb 2021 for recurrent pain and swelling ( OR cx no growth)   He had an aspirate of his knee that grew Citrobacter Koseri that was fairly S. Then another I and D with polyexchange in May of 2021 ( OR cx No growth). He has been on protracted oral ciprofloxacin but was having loosening of his prosthesis and there was concern for progressive infection. He underwent I and D with resection arthroplasty with antibiotic spacer placement on 06/2020 with OR cultures growing Amp S E faecalis, completed 6 weeks of IV Zosyn on 10/22 after which patient  was started on Augmentin 875/$RemoveBeforeDE'125mg'VPjglcgfnmMxtJo$  PO BID until 2/2 when it was stopped and finally had  re-implantation of rt TKA in 12/07/20. OR tissue cultures No growth, no organisms in gram stain.   Patient was doing well until few days prior to admit when he was admitted directly by Ortho for concern of prosthetic joint infection of the right knee.  Patient had increasing discomfort and had developed drainage from the anterior aspect of the right knee for the last few days.  Denies having any fevers or chills but had sweats.  He underwent I&D of the right  knee by Dr. Wynelle Link on 6/22.  OR note reviewed in detail-" tremendous amount of seropurulent fluid in the subcutaneous space which was sent for gram stain culture and sensitivity; there was a communication with the joint superiorly and fluid coming from the joint also, polyethylene implant was not removed.   ROS: General- Denies fever, loss of appetite and loss of weight HEENT - Denies headache, blurry vision, neck pain, sinus pain Chest - Denies any chest pain, SOB or cough CVS- Denies any dizziness/lightheadedness, syncopal attacks, palpitations Abdomen- Denies any nausea, vomiting, abdominal pain, hematochezia and diarrhea Neuro - Denies any weakness, numbness, tingling sensation Psych - Denies any changes in mood irritability or depressive symptoms GU- Denies any burning, dysuria, hematuria or increased frequency of urination Skin - denies any rashes/lesions MSK -  RT knee tenderness/swelling and restricted range of motion+  Past Medical History:  Diagnosis Date   Allergy    OCC    Anemia    hx of   Asthma    Cataract    small- forming    Cellulitis of lower extremity    "usually RLL; this time it's got up to my lower abdoment" (02/11/2014)-series of antibiotics completed 02-28-14   Diverticulosis    Edema    legs   GERD (gastroesophageal reflux disease)    Hypertension    Infection of right knee (HCC)    OSA (obstructive sleep apnea)    does not use cpap    Pneumonia    20+ years ago   PONV (postoperative nausea and vomiting)    Rheumatoid arthritis ([place]) dx'd ~ 1977   S/P PICC central line placement 03-16-14   right upper arm remains intact.05-11-14 removed 1 week ago.   Sleep apnea    no cpap    Past Surgical History:  Procedure Laterality Date   COLONOSCOPY  2012   EXCISIONAL HEMORRHOIDECTOMY     EXCISIONAL TOTAL KNEE ARTHROPLASTY WITH ANTIBIOTIC SPACERS Right 03/18/2014   Procedure: RIGHT KNEE RESECTION ARTHROPLASTY WITH ANTIBIOTIC SPACERS;  Surgeon: [name],  MD;  Location: [facility];  Service: Orthopedics;  Laterality: Right;   EXCISIONAL TOTAL KNEE ARTHROPLASTY WITH ANTIBIOTIC SPACERS Right 06/22/2020   Procedure: Right knee resection arthroplasty, antibiotic spacer;  Surgeon: Gaynelle Arabian, MD;  Location: [facility];  Service: Orthopedics;  Laterality: Right;  172min   FOREIGN BODY REMOVAL Left    "BB" removal above left eye-teen yrs.   HERNIA REPAIR     I & D KNEE WITH POLY EXCHANGE Right 04/25/2016   Procedure: RIGHT KNEE IRRIGATION AND DEBRIDEMENT WITH POLY EXCHANGE;  Surgeon: Gaynelle Arabian, MD;  Location: [facility];  Service: Orthopedics;  Laterality: Right;   I & D KNEE WITH POLY EXCHANGE Right 10/01/2016   Procedure: IRRIGATION AND DEBRIDEMENT KNEE WITH POLY EXCHANGE;  Surgeon: Gaynelle Arabian, MD;  Location: [facility];  Service: Orthopedics;  Laterality: Right;   I & D  KNEE WITH POLY EXCHANGE Right 08/26/2017   Procedure: IRRIGATION AND DEBRIDEMENT KNEE WITH POLY EXCHANGE;  Surgeon: Gaynelle Arabian, MD;  Location: [facility];  Service: Orthopedics;  Laterality: Right;   I & D KNEE WITH POLY EXCHANGE Right 11/16/2019   Procedure: IRRIGATION AND DEBRIDEMENT KNEE WITH POLY EXCHANGE;  Surgeon: Gaynelle Arabian, MD;  Location: [facility];  Service: Orthopedics;  Laterality: Right;  90min   I & D KNEE WITH POLY EXCHANGE Right 02/22/2020   Procedure: Right knee irrigation and debridement;  Surgeon: Gaynelle Arabian, MD;  Location: [facility];  Service: Orthopedics;  Laterality: Right;  38min   INCISION AND DRAINAGE Right 09/28/2014   Procedure: INCISION AND DRAINAGE RIGHT KNEE;  Surgeon: [name], MD;  Location: [facility];  Service: Orthopedics;  Laterality: Right;   IRRIGATION AND DEBRIDEMENT KNEE Right 04/05/2021   Procedure: IRRIGATION AND DEBRIDEMENT KNEE;  Surgeon: Gaynelle Arabian, MD;  Location: [facility];  Service: Orthopedics;  Laterality: Right;   KNEE BURSECTOMY Right 03/13/2017   Procedure: RIGHT KNEE PREPATELLAR BURSECTOMY;  Surgeon: Gaynelle Arabian, MD;  Location: [facility];   Service: Orthopedics;  Laterality: Right;   picc line placement Right    right upper arm   REIMPLANTATION OF TOTAL KNEE Right 12/07/2020   Procedure: REIMPLANTATION OF RIGHT TOTAL KNEE;  Surgeon: Gaynelle Arabian, MD;  Location: [facility];  Service: Orthopedics;  Laterality: Right;  2 hrs   REPLACEMENT TOTAL HIP W/  RESURFACING IMPLANTS Bilateral 01/2001; 08/2010   "left; right"   REPLACEMENT TOTAL KNEE BILATERAL Bilateral 10/1991; 10/2006   "right; left"   REVISION TOTAL KNEE ARTHROPLASTY Right 2012   SPIGELIAN HERNIA Right 12/04/2015   Procedure: INCARCERATED SPIGELIAN HERNIA REPAIR WITH MESH;  Surgeon: Georganna Skeans, MD;  Location: [facility];  Service: General;  Laterality: Right;   TOTAL KNEE ARTHROPLASTY Right 05/21/2014   Procedure: RIGHT KNEE ARTHROPLASTY REINPLANTATION;  Surgeon: [name], MD;  Location: [facility];  Service: Orthopedics;  Laterality: Right;     Scheduled Meds:  aspirin EC  325 mg Oral BID   diltiazem  240 mg Oral Daily   docusate sodium  100 mg Oral BID   fluconazole  100 mg Oral Daily   fluticasone furoate-vilanterol  1 puff Inhalation Daily   metoprolol succinate  100 mg Oral QHS   pantoprazole  40 mg Oral Daily   Continuous Infusions:  sodium chloride 75 mL/hr at 04/06/21 4967   cefTRIAXone (ROCEPHIN)  IV     DAPTOmycin (CUBICIN)  IV     methocarbamol (ROBAXIN) IV     PRN Meds:.acetaminophen, bisacodyl, diphenhydrAMINE, Ipratropium-Albuterol, menthol-cetylpyridinium **OR** phenol, methocarbamol **OR** methocarbamol (ROBAXIN) IV, metoCLOPramide **OR** metoCLOPramide (REGLAN) injection, morphine injection, ondansetron **OR** ondansetron (ZOFRAN) IV, oxyCODONE, polyethylene glycol, sodium phosphate, traMADol  Allergies  Allergen Reactions   Avelox [Moxifloxacin Hcl In Nacl] Hives, Shortness Of Breath and Itching    Tolerates Cipro   Chlorhexidine     Skin red and burning after skin wash   Doxycycline Itching   Rocephin [Ceftriaxone Sodium In Dextrose] Hives and  Itching    Patient tolerated ancef 3g on 12/07/20   Sulfa Antibiotics Itching   Vancomycin Hives and Itching   E.E.S. [Erythromycin] Rash   Erythrocin Rash   Penicillins Rash    Tolerated augmentin   Tolerated Cephalosporin Date: 04/06/21.     Sulfasalazine Rash   Social History   Socioeconomic History   Marital status: Married    Spouse name: Not on file   Number of children: 1  Years of education: 53   Highest education level: Not on file  Occupational History   Not on file  Tobacco Use   Smoking status: Former    Packs/day: 1.00    Years: 3.00    Pack years: 3.00    Types: Cigarettes    Quit date: 03/16/1981    Years since quitting: 40.0   Smokeless tobacco: Current    Types: Snuff   Tobacco comments:    "quit smoking cigarettes in the 1980's"  Vaping Use   Vaping Use: Never used  Substance and Sexual Activity   Alcohol use: No   Drug use: No   Sexual activity: Yes    Partners: Male  Other Topics Concern   Not on file  Social History Narrative   Not on file   Social Determinants of Health   Financial Resource Strain: Not on file  Food Insecurity: Not on file  Transportation Needs: Not on file  Physical Activity: Not on file  Stress: Not on file  Social Connections: Not on file  Intimate Partner Violence: Not on file   Vitals BP 118/67 (BP Location: Left Arm)   Pulse 78   Temp 99.1 F (37.3 C) (Oral)   Resp 18   Ht $R'5\' 8"'Bu$  (1.727 m)   Wt (!) 149.2 kg   SpO2 98%   BMI 50.02 kg/m    Physical Exam Constitutional:      Comments:   Cardiovascular:     Rate and Rhythm: Normal rate and regular rhythm.     Heart sounds: No murmur heard.   Pulmonary:     Effort: Pulmonary effort is normal.     Comments:   Abdominal:     Palpations: Abdomen is soft.     Tenderness:   Musculoskeletal:        General: No swelling or tenderness.   Skin:    Comments:   Neurological:     General: No focal deficit present.   Psychiatric:        Mood and  Affect: Mood normal.    Pertinent Microbiology Results for orders placed or performed during the hospital encounter of 04/05/21  SARS Coronavirus 2 by RT PCR (hospital order, performed in [facility] hospital lab) Nasopharyngeal Nasopharyngeal Swab     Status: Abnormal   Collection Time: 04/05/21  1:39 PM   Specimen: Nasopharyngeal Swab  Result Value Ref Range Status   SARS Coronavirus 2 POSITIVE (A) NEGATIVE Final    Comment: RESULT CALLED TO, READ BACK BY AND VERIFIED WITH: COLE,K. $RemoveBefor'@1503'GwUHwmkyLVzC$  ON 06.22.2022 BY COHEN,K (NOTE) SARS-CoV-2 target nucleic acids are DETECTED  SARS-CoV-2 RNA is generally detectable in upper respiratory specimens  during the acute phase of infection.  Positive results are indicative  of the presence of the identified virus, but do not rule out bacterial infection or co-infection with other pathogens not detected by the test.  Clinical correlation with patient history and  other diagnostic information is necessary to determine patient infection status.  The expected result is negative.  Fact Sheet for Patients:   StrictlyIdeas.no   Fact Sheet for Healthcare Providers:   BankingDealers.co.za    This test is not yet approved or cleared by the [place] FDA and  has been authorized for detection and/or diagnosis of SARS-CoV-2 by FDA under an Emergency Use Authorization (EUA).  This EUA will remain in effect (meaning thi s test can be used) for the duration of  the COVID-19 declaration under Section 564(b)(1) of the Act,  21 U.S.C. section 360-bbb-3(b)(1), unless the authorization is terminated or revoked sooner.  Performed at [facility], [place] [address] [place], [place] [address]      Pertinent Lab seen by me: CBC Latest Ref Rng & Units 04/06/2021 04/05/2021 12/09/2020  WBC 4.0 - 10.5 K/uL 5.5 9.6 11.7(H)  Hemoglobin 13.0 - 17.0 g/dL 10.1(L) 11.5(L) 11.1(L)  Hematocrit 39.0 - 52.0 %  32.4(L) 37.2(L) 36.5(L)  Platelets 150 - 400 K/uL 282 336 318   CMP Latest Ref Rng & Units 04/06/2021 04/05/2021 12/09/2020  Glucose 70 - 99 mg/dL 116(H) 111(H) 152(H)  BUN 6 - 20 mg/dL 36(H) 39(H) 18  Creatinine 0.61 - 1.24 mg/dL 2.93(H) 3.05(H) 0.95  Sodium 135 - 145 mmol/L 137 137 138  Potassium 3.5 - 5.1 mmol/L 4.0 3.9 5.3(H)  Chloride 98 - 111 mmol/L 108 106 106  CO2 22 - 32 mmol/L $RemoveB'23 22 26  'HgINZxEH$ Calcium 8.9 - 10.3 mg/dL 7.9(L) 8.5(L) 8.6(L)  Total Protein 6.5 - 8.1 g/dL - - -  Total Bilirubin 0.3 - 1.2 mg/dL - - -  Alkaline Phos 38 - 126 U/L - - -  AST 15 - 41 U/L - - -  ALT 0 - 44 U/L - - -     Pertinent Imagings/Other Imagings Plain films and CT images have been personally visualized and interpreted; radiology reports have been reviewed. Decision making incorporated into the Impression / Recommendations.   I have spent more than 70  minutes for this patient encounter including review of prior medical records with greater than 50% of time being face to face and coordination of their care.  Electronically signed by:   Rosiland Oz, MD Infectious Disease Physician [facility] for Infectious Disease Pager: [phone]

## 2021-04-06 NOTE — Progress Notes (Signed)
Subjective: 1 Day Post-Op Procedure(s) (LRB): IRRIGATION AND DEBRIDEMENT KNEE (Right) Patient reports pain as mild.   Patient seen in rounds with Dr. Lequita Halt. Patient is well, and has had no acute complaints or problems. No acute overnight events. Denies SOB, chest pain, or calf pain. Wound vac in place.    Objective: Vital signs in last 24 hours: Temp:  [97.3 F (36.3 C)-99.7 F (37.6 C)] 98.4 F (36.9 C) (06/23 0630) Pulse Rate:  [68-92] 86 (06/23 0630) Resp:  [10-20] 20 (06/23 0630) BP: (96-146)/(54-70) 146/70 (06/23 0630) SpO2:  [97 %-100 %] 98 % (06/23 0630) Weight:  [149.2 kg] 149.2 kg (06/22 1812)  Intake/Output from previous day:  Intake/Output Summary (Last 24 hours) at 04/06/2021 0746 Last data filed at 04/06/2021 0645 Gross per 24 hour  Intake 1121.9 ml  Output 990 ml  Net 131.9 ml     Intake/Output this shift: No intake/output data recorded.  Labs: Recent Labs    04/05/21 1432 04/06/21 0406  HGB 11.5* 10.1*   Recent Labs    04/05/21 1432 04/06/21 0406  WBC 9.6 5.5  RBC 4.35 3.78*  HCT 37.2* 32.4*  PLT 336 282   Recent Labs    04/05/21 1432 04/06/21 0406  NA 137 137  K 3.9 4.0  CL 106 108  CO2 22 23  BUN 39* 36*  CREATININE 3.05* 2.93*  GLUCOSE 111* 116*  CALCIUM 8.5* 7.9*   No results for input(s): LABPT, INR in the last 72 hours.  Exam: General - Patient is Alert and Oriented Extremity - Neurologically intact Neurovascular intact Intact pulses distally Dorsiflexion/Plantar flexion intact Dressing - dressing C/D/I Wound vac intact, minimal active drainage upon exam, mildly serosanguineous. Motor Function - intact, moving foot and toes well on exam.   Past Medical History:  Diagnosis Date   Allergy    OCC    Anemia    hx of   Asthma    Cataract    small- forming    Cellulitis of lower extremity    "usually RLL; this time it's got up to my lower abdoment" (02/11/2014)-series of antibiotics completed 02-28-14   Diverticulosis     Edema    legs   GERD (gastroesophageal reflux disease)    Hypertension    Infection of right knee (HCC)    OSA (obstructive sleep apnea)    does not use cpap    Pneumonia    20+ years ago   PONV (postoperative nausea and vomiting)    Rheumatoid arthritis (HCC) dx'd ~ 1977   S/P PICC central line placement 03-16-14   right upper arm remains intact.05-11-14 removed 1 week ago.   Sleep apnea    no cpap     Assessment/Plan: 1 Day Post-Op Procedure(s) (LRB): IRRIGATION AND DEBRIDEMENT KNEE (Right) Active Problems:   Septic arthritis of knee, right (HCC)  Estimated body mass index is 50.02 kg/m as calculated from the following:   Height as of this encounter: 5\' 8"  (1.727 m).   Weight as of this encounter: 149.2 kg.   DVT Prophylaxis - Aspirin Weight bearing as tolerated.  Concerned about patient's kidney function - his creatinine and GFR, when compared to his levels in February, have severely deteriorated. Will consult the hospitalist services for evaluation.   Patient is s/p reimplantation of his right knee TKA, who's symptoms manifested quickly over the past three days, prompting rapid need for irrigation and debridement. Will consult infectious disease today - at this time, will need to proceed with  the plan of the implantation staying in place in hopes of suppressing the infection vs. possible amputation.    Nelia Shi, MBA PA-C Orthopedic Surgery 04/06/2021, 7:46 AM

## 2021-04-06 NOTE — Progress Notes (Signed)
PICC line order placed this afternoon.   IV team recommending IR insert PICC line placement due to creatinine clearance less than 30.   IR team paged.   Nelia Shi, MBA, PA-C Orthopedic Surgery  [facility] [phone]

## 2021-04-06 NOTE — Consult Note (Addendum)
Medical Consultation  [name] RXV:[id number] DOB: 1961/10/09 DOA: 04/05/2021 PCP: Vincent Matin, MD   Requesting physician: Dr. Despina Hick Date of consultation: 04/06/21 Reason for consultation: AKI  Impression/Recommendations Right knee septic arthritis     - per primary team and ID     - pain control per primary team  AKI     - no previous history of CKD     - UOP ok; checking renal US     - he's been on losartan and maxzide "for years" w/o issue     - no problems w/ oral intake in the last couple of weeks     - Scr back in Feb 2022 was 0.95; it's 2.93 today     - hold losartan and maxzide; watch nephrotoxins     - gentle hydration     - also check albumin level  HTN     - continue home regimen, except for meds listed above  Asthma      - continue home regimen  RA     - MTX held d/t infection  Chronic lymphedema     - BLE edema is chronic     - no Hx of HF per his account     - continue gentle hydration to deal w/ AKI above; continue to monitor fluid status  COVID 19 Positive     - incidental, no symptoms, follow  Remainder per primary team.  [facility] will follow-up again tomorrow. Please contact me if I can be of assistance in the meanwhile. Thank you for this consultation.  Chief Complaint: right knee pain  HPI:  [name] is a 60 y.o. male with medical history significant of HTN, ble lymphedema, RA. Presenting with right knee pain. Diagnosed with right septic knee and admitted to ortho service. He has been following with orthopedics outpatient. He had increased pain in his right knee and began to develop drainage. It was recommended that he have a washout. He has successfully completed that procedure. It was noted on his AM labs that he had an elevated Scr. This was also noted on yesterday's labs. He reports no previous history of CKD. Review of previous labs from earlier this year and late 2021 show a normal renal function. He denies any recent medicine  changes. He denies any changes on UOP. He denies any problems with hypotension or appetite change. Since his Scr has only marginally improved from yesterday to today, [facility] was called for assistance on his AKI.   Review of Systems:  Denies CP, dyspnea, palpitations, N/V/D, fevers, syncopal episodes, changes in urination. Remainder of ROS is negative for all not mentioned in HPI.   Past Medical History:  Diagnosis Date   Allergy    OCC    Anemia    hx of   Asthma    Cataract    small- forming    Cellulitis of lower extremity    "usually RLL; this time it's got up to my lower abdoment" (02/11/2014)-series of antibiotics completed 02-28-14   Diverticulosis    Edema    legs   GERD (gastroesophageal reflux disease)    Hypertension    Infection of right knee (HCC)    OSA (obstructive sleep apnea)    does not use cpap    Pneumonia    20+ years ago   PONV (postoperative nausea and vomiting)    Rheumatoid arthritis (HCC) dx'd ~ 1977   S/P PICC central line placement 03-16-14   right upper  arm remains intact.05-11-14 removed 1 week ago.   Sleep apnea    no cpap    Past Surgical History:  Procedure Laterality Date   COLONOSCOPY  2012   EXCISIONAL HEMORRHOIDECTOMY     EXCISIONAL TOTAL KNEE ARTHROPLASTY WITH ANTIBIOTIC SPACERS Right 03/18/2014   Procedure: RIGHT KNEE RESECTION ARTHROPLASTY WITH ANTIBIOTIC SPACERS;  Surgeon: Loanne Drilling, MD;  Location: [facility];  Service: Orthopedics;  Laterality: Right;   EXCISIONAL TOTAL KNEE ARTHROPLASTY WITH ANTIBIOTIC SPACERS Right 06/22/2020   Procedure: Right knee resection arthroplasty, antibiotic spacer;  Surgeon: Ollen Gross, MD;  Location: [facility];  Service: Orthopedics;  Laterality: Right;    FOREIGN BODY REMOVAL Left    "BB" removal above left eye-teen yrs.   HERNIA REPAIR     I & D KNEE WITH POLY EXCHANGE Right 04/25/2016   Procedure: RIGHT KNEE IRRIGATION AND DEBRIDEMENT WITH POLY EXCHANGE;  Surgeon: Ollen Gross, MD;  Location: [facility];   Service: Orthopedics;  Laterality: Right;   I & D KNEE WITH POLY EXCHANGE Right 10/01/2016   Procedure: IRRIGATION AND DEBRIDEMENT KNEE WITH POLY EXCHANGE;  Surgeon: Ollen Gross, MD;  Location: [facility];  Service: Orthopedics;  Laterality: Right;   I & D KNEE WITH POLY EXCHANGE Right 08/26/2017   Procedure: IRRIGATION AND DEBRIDEMENT KNEE WITH POLY EXCHANGE;  Surgeon: Ollen Gross, MD;  Location: [facility];  Service: Orthopedics;  Laterality: Right;   I & D KNEE WITH POLY EXCHANGE Right 11/16/2019   Procedure: IRRIGATION AND DEBRIDEMENT KNEE WITH POLY EXCHANGE;  Surgeon: Ollen Gross, MD;  Location: [facility];  Service: Orthopedics;  Laterality: Right;    I & D KNEE WITH POLY EXCHANGE Right 02/22/2020   Procedure: Right knee irrigation and debridement;  Surgeon: Ollen Gross, MD;  Location: [facility];  Service: Orthopedics;  Laterality: Right;    INCISION AND DRAINAGE Right 09/28/2014   Procedure: INCISION AND DRAINAGE RIGHT KNEE;  Surgeon: Loanne Drilling, MD;  Location: [facility];  Service: Orthopedics;  Laterality: Right;   IRRIGATION AND DEBRIDEMENT KNEE Right 04/05/2021   Procedure: IRRIGATION AND DEBRIDEMENT KNEE;  Surgeon: Ollen Gross, MD;  Location: [facility];  Service: Orthopedics;  Laterality: Right;   KNEE BURSECTOMY Right 03/13/2017   Procedure: RIGHT KNEE PREPATELLAR BURSECTOMY;  Surgeon: Ollen Gross, MD;  Location: [facility];  Service: Orthopedics;  Laterality: Right;   picc line placement Right    right upper arm   REIMPLANTATION OF TOTAL KNEE Right 12/07/2020   Procedure: REIMPLANTATION OF RIGHT TOTAL KNEE;  Surgeon: Ollen Gross, MD;  Location: [facility];  Service: Orthopedics;  Laterality: Right;  2 hrs   REPLACEMENT TOTAL HIP W/  RESURFACING IMPLANTS Bilateral 01/2001; 08/2010   "left; right"   REPLACEMENT TOTAL KNEE BILATERAL Bilateral 10/1991; 10/2006   "right; left"   REVISION TOTAL KNEE ARTHROPLASTY Right 2012   SPIGELIAN HERNIA Right 12/04/2015   Procedure: INCARCERATED  SPIGELIAN HERNIA REPAIR WITH MESH;  Surgeon: Violeta Gelinas, MD;  Location: [facility];  Service: General;  Laterality: Right;   TOTAL KNEE ARTHROPLASTY Right 05/21/2014   Procedure: RIGHT KNEE ARTHROPLASTY REINPLANTATION;  Surgeon: Loanne Drilling, MD;  Location: [facility];  Service: Orthopedics;  Laterality: Right;   Social History:  reports that he quit smoking about 40 years ago. His smoking use included cigarettes. He has a 3.00 pack-year smoking history. His smokeless tobacco use includes snuff. He reports that he does not drink alcohol and does not use drugs.  Allergies  Allergen Reactions  Avelox [Moxifloxacin Hcl In Nacl] Hives, Shortness Of Breath and Itching    Tolerates Cipro   Chlorhexidine     Skin red and burning after skin wash   Doxycycline Itching   Rocephin [Ceftriaxone Sodium In Dextrose] Hives and Itching    Patient tolerated ancef 3g on 12/07/20   Sulfa Antibiotics Itching   Vancomycin Hives and Itching   E.E.S. [Erythromycin] Rash   Erythrocin Rash   Penicillins Rash    Tolerated augmentin   Tolerated Cephalosporin Date: 04/06/21.     Sulfasalazine Rash   Family History  Problem Relation Age of Onset   Colon cancer Mother 68       passed age 89 - stage 4    Hypertension Mother    Diabetes Mellitus II Paternal Grandmother    Colon polyps Neg Hx    Esophageal cancer Neg Hx    Stomach cancer Neg Hx    Rectal cancer Neg Hx     Prior to Admission medications   Medication Sig Start Date End Date Taking? Authorizing Provider  amoxicillin-clavulanate (AUGMENTIN) 875-125 MG tablet Take 1 tablet by mouth 2 (two) times daily.   Yes [provider]  cholecalciferol (VITAMIN D3) 25 MCG (1000 UNIT) tablet Take 1,000 Units by mouth daily.   Yes [provider]  diltiazem (CARDIZEM CD) 240 MG 24 hr capsule Take 240 mg by mouth daily.  11/12/10  Yes [provider]  Fluticasone-Salmeterol (ADVAIR) 250-50 MCG/DOSE AEPB Inhale 1 puff into the lungs  daily.   Yes [provider]  Ipratropium-Albuterol (COMBIVENT RESPIMAT) 20-100 MCG/ACT AERS respimat Inhale 1 puff into the lungs every 6 (six) hours as needed for wheezing or shortness of breath.   Yes [provider]  losartan (COZAAR) 100 MG tablet Take 100 mg by mouth daily.  11/12/10  Yes [provider]  methocarbamol (ROBAXIN) 500 MG tablet Take 1 tablet (500 mg total) by mouth every 6 (six) hours as needed for muscle spasms. 12/09/20  Yes Nelia Shi D, PA-C  methotrexate (RHEUMATREX) 2.5 MG tablet Take 10 mg by mouth every Monday. Caution:Chemotherapy. Protect from light.   Yes [provider]  metoprolol succinate (TOPROL-XL) 100 MG 24 hr tablet Take 100 mg by mouth daily. Take with or immediately following a meal.   Yes [provider]  Multiple Vitamin (MULTIVITAMIN WITH MINERALS) TABS tablet Take 1 tablet by mouth daily.   Yes [provider]  nystatin (MYCOSTATIN/NYSTOP) powder Apply 1 application topically 3 (three) times daily. 07/02/20  Yes Daiva Eves, Lisette Grinder, MD  oxyCODONE (OXY IR/ROXICODONE) 5 MG immediate release tablet Take 1-2 tablets (5-10 mg total) by mouth every 6 (six) hours as needed for moderate pain or severe pain. 12/09/20  Yes Nelia Shi D, PA-C  RABEprazole (ACIPHEX) 20 MG tablet Take 20 mg by mouth daily before breakfast.    Yes [provider]  RYBELSUS 7 MG TABS Take 7 mg by mouth every morning. 03/10/21  Yes [provider]  traMADol (ULTRAM) 50 MG tablet Take 1-2 tablets (50-100 mg total) by mouth every 6 (six) hours as needed for moderate pain. 12/09/20  Yes Nelia Shi D, PA-C  triamterene-hydrochlorothiazide (MAXZIDE-25) 37.5-25 MG tablet Take 1 tablet by mouth daily.   Yes [provider]  aspirin EC 325 MG EC tablet Take 1 tablet (325 mg total) by mouth 2 (two) times daily. Then take one 81 mg aspirin once a day for three weeks. Then discontinue aspirin. Patient not  taking: No  sig reported 12/09/20   Alyssa Grove, PA-C  diphenoxylate-atropine (LOMOTIL) 2.5-0.025 MG/5ML liquid Take 5 mLs by mouth 4 (four) times daily as needed for diarrhea or loose stools. Patient not taking: Reported on 04/05/2021 07/02/20   Daiva Eves, Lisette Grinder, MD   Physical Exam: Blood pressure 118/67, pulse 78, temperature 99.1 F (37.3 C), temperature source Oral, resp. rate 18, height 5\' 8"  (1.727 m), weight (!) 149.2 kg, SpO2 98 %. Vitals:   04/06/21 0630 04/06/21 0837  BP: (!) 146/70 118/67  Pulse: 86 78  Resp: 20 18  Temp: 98.4 F (36.9 C) 99.1 F (37.3 C)  SpO2: 98% 98%    General: 60 y.o. male resting in bed in NAD Eyes: PERRL, normal sclera ENMT: Nares patent w/o discharge, orophaynx clear, dentition normal, ears w/o discharge/lesions/ulcers Neck: Supple, trachea midline Cardiovascular: RRR, +S1, S2, no m/g/r, equal pulses throughout Respiratory: CTABL, no w/r/r, normal WOB GI: BS+, NDNT, no masses noted, no organomegaly noted MSK: No c/c; BLE edema chronic, RLE bandaging CDI Skin: No rashes, bruises, ulcerations noted Neuro: A&O x 3, no focal deficits Psyc: Appropriate interaction and affect, calm/cooperative  Labs on Admission:  Basic Metabolic Panel: Recent Labs  Lab 04/05/21 1432 04/06/21 0406  NA 137 137  K 3.9 4.0  CL 106 108  CO2 22 23  GLUCOSE 111* 116*  BUN 39* 36*  CREATININE 3.05* 2.93*  CALCIUM 8.5* 7.9*   Liver Function Tests: No results for input(s): AST, ALT, ALKPHOS, BILITOT, PROT, ALBUMIN in the last 168 hours. No results for input(s): LIPASE, AMYLASE in the last 168 hours. No results for input(s): AMMONIA in the last 168 hours. CBC: Recent Labs  Lab 04/05/21 1432 04/06/21 0406  WBC 9.6 5.5  NEUTROABS 8.9*  --   HGB 11.5* 10.1*  HCT 37.2* 32.4*  MCV 85.5 85.7  PLT 336 282   Cardiac Enzymes: No results for input(s): CKTOTAL, CKMB, CKMBINDEX, TROPONINI in the last 168 hours. BNP: Invalid input(s): POCBNP CBG: No  results for input(s): GLUCAP in the last 168 hours.  Radiological Exams on Admission: No results found.  EKG: None obtained   Teddy Spike DO [facility]  If 7PM-7AM, please contact night-coverage [email or web address] 04/06/2021, 9:12 AM

## 2021-04-06 NOTE — Progress Notes (Signed)
RN aware PICC will not be place today.

## 2021-04-06 NOTE — Plan of Care (Signed)
  Problem: Education: Goal: Knowledge of risk factors and measures for prevention of condition will improve Outcome: Progressing   Problem: Clinical Measurements: Goal: Will remain free from infection Outcome: Progressing   Problem: Activity: Goal: Risk for activity intolerance will decrease Outcome: Progressing   Problem: Pain Managment: Goal: General experience of comfort will improve Outcome: Progressing   Problem: Safety: Goal: Ability to remain free from injury will improve Outcome: Progressing

## 2021-04-07 ENCOUNTER — Inpatient Hospital Stay (HOSPITAL_COMMUNITY): Payer: 59

## 2021-04-07 DIAGNOSIS — N179 Acute kidney failure, unspecified: Secondary | ICD-10-CM

## 2021-04-07 DIAGNOSIS — I159 Secondary hypertension, unspecified: Secondary | ICD-10-CM

## 2021-04-07 DIAGNOSIS — I10 Essential (primary) hypertension: Secondary | ICD-10-CM

## 2021-04-07 DIAGNOSIS — M009 Pyogenic arthritis, unspecified: Secondary | ICD-10-CM

## 2021-04-07 HISTORY — PX: IR FLUORO GUIDE CV LINE RIGHT: IMG2283

## 2021-04-07 HISTORY — PX: IR US GUIDE VASC ACCESS RIGHT: IMG2390

## 2021-04-07 LAB — BASIC METABOLIC PANEL
Anion gap: 8 (ref 5–15)
BUN: 32 mg/dL — ABNORMAL HIGH (ref 6–20)
CO2: 22 mmol/L (ref 22–32)
Calcium: 8.2 mg/dL — ABNORMAL LOW (ref 8.9–10.3)
Chloride: 111 mmol/L (ref 98–111)
Creatinine, Ser: 1.92 mg/dL — ABNORMAL HIGH (ref 0.61–1.24)
GFR, Estimated: 39 mL/min — ABNORMAL LOW (ref >60)
Glucose, Bld: 150 mg/dL — ABNORMAL HIGH (ref 70–99)
Potassium: 4.6 mmol/L (ref 3.5–5.1)
Sodium: 141 mmol/L (ref 135–145)

## 2021-04-07 LAB — CBC
HCT: 34.4 % — ABNORMAL LOW (ref 39.0–52.0)
Hemoglobin: 10.4 g/dL — ABNORMAL LOW (ref 13.0–17.0)
MCH: 26.1 pg (ref 26.0–34.0)
MCHC: 30.2 g/dL (ref 30.0–36.0)
MCV: 86.2 fL (ref 80.0–100.0)
Platelets: 273 10*3/uL (ref 150–400)
RBC: 3.99 MIL/uL — ABNORMAL LOW (ref 4.22–5.81)
RDW: 19.1 % — ABNORMAL HIGH (ref 11.5–15.5)
WBC: 3.5 10*3/uL — ABNORMAL LOW (ref 4.0–10.5)
nRBC: 0 % (ref 0.0–0.2)

## 2021-04-07 LAB — ACID FAST SMEAR (AFB, MYCOBACTERIA): Acid Fast Smear: NEGATIVE

## 2021-04-07 MED ORDER — CEFTRIAXONE IV (FOR PTA / DISCHARGE USE ONLY): 2.0000 g | INTRAVENOUS | 0 refills | Status: DC

## 2021-04-07 MED ORDER — ASPIRIN 325 MG PO TBEC
325.0000 mg | DELAYED_RELEASE_TABLET | Freq: Two times a day (BID) | ORAL | 0 refills | Status: DC
Start: 2021-04-07 — End: 2021-04-07

## 2021-04-07 MED ORDER — LIDOCAINE HCL 1 % IJ SOLN
INTRAMUSCULAR | Status: AC | PRN
  Administered 2021-04-07: 10 mL via INTRADERMAL

## 2021-04-07 MED ORDER — DILTIAZEM HCL ER COATED BEADS 180 MG PO CP24
180.0000 mg | ORAL_CAPSULE | Freq: Every day | ORAL | Status: DC
  Administered 2021-04-07 – 2021-04-08 (×2): 180 mg via ORAL
  Filled 2021-04-07: qty 1

## 2021-04-07 MED ORDER — ASPIRIN 325 MG PO TBEC: 325.0000 mg | DELAYED_RELEASE_TABLET | Freq: Two times a day (BID) | ORAL | 0 refills | Status: AC

## 2021-04-07 MED ORDER — METHOCARBAMOL 500 MG PO TABS: 500.0000 mg | ORAL_TABLET | Freq: Four times a day (QID) | ORAL | 0 refills | Status: AC | PRN

## 2021-04-07 MED ORDER — DAPTOMYCIN IV (FOR PTA / DISCHARGE USE ONLY): 800.0000 mg | INTRAVENOUS | 0 refills | Status: DC

## 2021-04-07 MED ORDER — LIDOCAINE HCL 1 % IJ SOLN
INTRAMUSCULAR | Status: AC
  Filled 2021-04-07: qty 20

## 2021-04-07 MED ORDER — METOPROLOL SUCCINATE ER 50 MG PO TB24
50.0000 mg | ORAL_TABLET | Freq: Every day | ORAL | Status: DC
  Administered 2021-04-07: 50 mg via ORAL
  Filled 2021-04-07: qty 1

## 2021-04-07 MED ORDER — HEPARIN SOD (PORK) LOCK FLUSH 100 UNIT/ML IV SOLN
INTRAVENOUS | Status: AC
  Filled 2021-04-07: qty 5

## 2021-04-07 MED ORDER — OXYCODONE HCL 5 MG PO TABS: 5.0000 mg | ORAL_TABLET | ORAL | 0 refills | Status: AC | PRN

## 2021-04-07 NOTE — Plan of Care (Signed)
  Problem: Education: Goal: Knowledge of risk factors and measures for prevention of condition will improve Outcome: Progressing   Problem: Respiratory: Goal: Complications related to the disease process, condition or treatment will be avoided or minimized Outcome: Progressing   Problem: Clinical Measurements: Goal: Will remain free from infection Outcome: Progressing Goal: Respiratory complications will improve Outcome: Progressing

## 2021-04-07 NOTE — Progress Notes (Signed)
PHARMACY CONSULT NOTE FOR:  OUTPATIENT  PARENTERAL ANTIBIOTIC THERAPY (OPAT)  Indication: Prosthetic joint infection  Regimen: ceftriaxone 2g IV q24h AND daptomycin 800mg  IV q24h  End date: 05/18/2021  IV antibiotic discharge orders are pended. To discharging provider:  please sign these orders via discharge navigator,  Select New Orders & click on the button choice - Manage This Unsigned Work.     Thank you for allowing pharmacy to be a part of this patient's care.  07/18/2021 04/07/2021, 3:03 PM

## 2021-04-07 NOTE — TOC Progression Note (Signed)
Transition of Care [facility]) - Progression Note    Patient Details  Name: [name] MRN: [id number] Date of Birth: July 15, 1961  [facility] CM/SW Contact  Geni Bers, RN Phone Number: 04/07/2021, 3:09 PM  Clinical Narrative:     Spoke with pt's wife concerning IV infusion company. [facility] will not be able to service pt related to insurance. Referral was given to [facility], RN with [facility]. Pt's wife was okay with that. Wife states that she do not want [facility] for [facility]   Expected Discharge Plan: Home w Home Health Services Barriers to Discharge: No Barriers Identified  Expected Discharge Plan and Services Expected Discharge Plan: Home w Home Health Services       Living arrangements for the past 2 months: Single Family Home                                       Social Determinants of Health (SDOH) Interventions    Readmission Risk Interventions No flowsheet data found.

## 2021-04-07 NOTE — Procedures (Signed)
Interventional Radiology Procedure Note  Procedure: Right IJ tunneled central venous catheter placement  Complications: None  Estimated Blood Loss: < 10 mL  Findings: 24 cm, 6 Fr DL Power Line placed via right IJ vein. Tip at SVC/RA junction. OK to use.  Jodi Marble. Fredia Sorrow, M.D Pager:  [phone]

## 2021-04-07 NOTE — Progress Notes (Signed)
   Subjective: 2 Days Post-Op Procedure(s) (LRB): IRRIGATION AND DEBRIDEMENT KNEE (Right) Patient reports pain as mild.   Patient seen in rounds by Dr. Lequita Halt. Patient is well, and has had no acute complaints or problems Plan is to go Home after hospital stay.  Objective: Vital signs in last 24 hours: Temp:  [97.7 F (36.5 C)-99.1 F (37.3 C)] 97.7 F (36.5 C) (06/24 0342) Pulse Rate:  [62-80] 62 (06/24 0342) Resp:  [18-20] 19 (06/24 0342) BP: (107-139)/(67-82) 133/82 (06/24 0342) SpO2:  [97 %-99 %] 99 % (06/24 0342)  Intake/Output from previous day:  Intake/Output Summary (Last 24 hours) at 04/07/2021 0751 Last data filed at 04/07/2021 0608 Gross per 24 hour  Intake 1892.85 ml  Output 985 ml  Net 907.85 ml    Intake/Output this shift: No intake/output data recorded.  Labs: Recent Labs    04/05/21 1432 04/06/21 0406 04/07/21 0335  HGB 11.5* 10.1* 10.4*   Recent Labs    04/06/21 0406 04/07/21 0335  WBC 5.5 3.5*  RBC 3.78* 3.99*  HCT 32.4* 34.4*  PLT 282 273   Recent Labs    04/06/21 0406 04/07/21 0335  NA 137 141  K 4.0 4.6  CL 108 111  CO2 23 22  BUN 36* 32*  CREATININE 2.93* 1.92*  GLUCOSE 116* 150*  CALCIUM 7.9* 8.2*   No results for input(s): LABPT, INR in the last 72 hours.  Exam: General - Patient is Alert and Oriented Extremity - Neurologically intact Neurovascular intact Sensation intact distally Dorsiflexion/Plantar flexion intact Dressing/Incision - clean, dry, no drainage. Bulky dressing in place. Motor Function - intact, moving foot and toes well on exam.   Past Medical History:  Diagnosis Date   Allergy    OCC    Anemia    hx of   Asthma    Cataract    small- forming    Cellulitis of lower extremity    "usually RLL; this time it's got up to my lower abdoment" (02/11/2014)-series of antibiotics completed 02-28-14   Diverticulosis    Edema    legs   GERD (gastroesophageal reflux disease)    Hypertension    Infection of  right knee (HCC)    OSA (obstructive sleep apnea)    does not use cpap    Pneumonia    20+ years ago   PONV (postoperative nausea and vomiting)    Rheumatoid arthritis (HCC) dx'd ~ 1977   S/P PICC central line placement 03-16-14   right upper arm remains intact.05-11-14 removed 1 week ago.   Sleep apnea    no cpap     Assessment/Plan: 2 Days Post-Op Procedure(s) (LRB): IRRIGATION AND DEBRIDEMENT KNEE (Right) Active Problems:   Septic arthritis of knee, right (HCC)  Estimated body mass index is 50.02 kg/m as calculated from the following:   Height as of this encounter: 5\' 8"  (1.727 m).   Weight as of this encounter: 149.2 kg.  DVT Prophylaxis - Aspirin Weight-bearing as tolerated  Awaiting PICC line placement by IR Creatinine improved at 1.92 this AM, appreciative of hospitalist' assistance ID awaiting pending cultures before final recommendations  , PA-C Orthopedic Surgery [phone] 04/07/2021, 7:51 AM

## 2021-04-07 NOTE — Plan of Care (Signed)

## 2021-04-07 NOTE — Progress Notes (Signed)
Consult Progress Note    SHISHIR [name]   LOV:[id number]  DOB: 23-Apr-1961  DOA: 04/05/2021     1  PCP: Jarome Matin, MD   Hospital Course: [name] is a 60 y.o. male with medical history significant of HTN, B/L LE lymphedema, RA. Presenting with right knee pain. Diagnosed with right septic knee and admitted to ortho service. He has been following with orthopedics outpatient. He had increased pain in his right knee and began to develop drainage. It was recommended that he have a washout. He has successfully completed that procedure. It was noted on his AM labs that he had an elevated Scr. This was also noted on yesterday's labs. He reports no previous history of CKD. Review of previous labs from earlier this year and late 2021 show a normal renal function. He denies any recent medicine changes. He denies any changes on UOP.  He did endorse that his appetite had been poor for a few days prior to surgery.  He had also been taking his home medications as prescribed which include blood pressure medications and diuretics.   Interval History:  Sitting up in recliner with feet elevated and wife bedside.  No events overnight.  Overall seems to be progressing and he is hopeful for discharging over the weekend.  ROS: Constitutional: negative for chills and fevers, Respiratory: negative for cough and sputum, Cardiovascular: negative for chest pain, and Gastrointestinal: negative for abdominal pain  Assessment & Plan:  AKI - no previous history of known CKD - creat 0.9 to 1.1 in Feb 2022; developed creat up to 3.05 on 6/22 - UOP ok; renal u/s reassuring too and no hydro or obstruction noted - suspect likely component of hypotension (105/58) along with volume depletion with ongoing diuretic use at home and some mild ATN having developed - creat seems to have peaked at 3.05 on 6/22 and is downtrending - continue strict I&O; if ATN component, UOP will pick up in diuretic phase so agree with NS  @ 75 for now too. If 3rd spaces with IVF, will add IV albumin (2.7) - continue holding losartan and maxide; allow BP to trend back up some   HTN - decrease diltiazem from 240 to 180 mg daily and watch BP/HR - decrease Toprol from 100 to 50 mg daily and watch response   Right knee septic arthritis     - per primary team and ID     - pain control per primary team   Asthma      - continue home regimen   RA     - MTX held d/t infection   Chronic lymphedema     - BLE edema is chronic     - no Hx of HF per his account     - continue gentle hydration to deal w/ AKI above; continue to monitor fluid status   COVID 19 Positive     - incidental, no symptoms, follow   Remainder per primary team.  Old records reviewed in assessment of this patient  Antimicrobials: Rocephin Dapto  DVT prophylaxis: SCDs Start: 04/05/21 1834 Place TED hose Start: 04/05/21 1834   Code Status:   Code Status: Full Code  Objective: Blood pressure 133/82, pulse 62, temperature 97.7 F (36.5 C), temperature source Oral, resp. rate 19, height 5\' 8"  (1.727 m), weight (!) 149.2 kg, SpO2 99 %.  Examination: General appearance: alert, cooperative, and no distress Head: Normocephalic, without obvious abnormality, atraumatic Eyes:  EOMI Lungs: clear to auscultation  bilaterally Heart: regular rate and rhythm and S1, S2 normal Abdomen:  obese, soft, NT, ND, BS present Extremities:  B/L LE lymphedema noted. R knee wrapped in ACE/dressing with drain noted Skin: mobility and turgor normal Neurologic: Grossly normal  Data Reviewed: I have personally reviewed following labs and imaging studies Results for orders placed or performed during the hospital encounter of 04/05/21 (from the past 24 hour(s))  CK     Status: Abnormal   Collection Time: 04/06/21 12:09 PM  Result Value Ref Range   Total CK 37 (L) 49 - 397 U/L  Albumin     Status: Abnormal   Collection Time: 04/06/21 12:27 PM  Result Value Ref Range    Albumin 2.7 (L) 3.5 - 5.0 g/dL  Sedimentation rate     Status: Abnormal   Collection Time: 04/06/21 12:27 PM  Result Value Ref Range   Sed Rate 67 (H) 0 - 16 mm/hr  C-reactive protein     Status: Abnormal   Collection Time: 04/06/21 12:27 PM  Result Value Ref Range   CRP 26.6 (H) <1.0 mg/dL  SARS CORONAVIRUS 2 (TAT 6-24 HRS) Nasopharyngeal Nasopharyngeal Swab     Status: Abnormal   Collection Time: 04/06/21 12:57 PM   Specimen: Nasopharyngeal Swab  Result Value Ref Range   SARS Coronavirus 2 POSITIVE (A) NEGATIVE  CBC     Status: Abnormal   Collection Time: 04/07/21  3:35 AM  Result Value Ref Range   WBC 3.5 (L) 4.0 - 10.5 K/uL   RBC 3.99 (L) 4.22 - 5.81 MIL/uL   Hemoglobin 10.4 (L) 13.0 - 17.0 g/dL   HCT 86.5 (L) 78.4 - 69.6 %   MCV 86.2 80.0 - 100.0 fL   MCH 26.1 26.0 - 34.0 pg   MCHC 30.2 30.0 - 36.0 g/dL   RDW 29.5 (H) 28.4 - 13.2 %   Platelets 273 150 - 400 K/uL   nRBC 0.0 0.0 - 0.2 %  Basic metabolic panel     Status: Abnormal   Collection Time: 04/07/21  3:35 AM  Result Value Ref Range   Sodium 141 135 - 145 mmol/L   Potassium 4.6 3.5 - 5.1 mmol/L   Chloride 111 98 - 111 mmol/L   CO2 22 22 - 32 mmol/L   Glucose, Bld 150 (H) 70 - 99 mg/dL   BUN 32 (H) 6 - 20 mg/dL   Creatinine, Ser 4.40 (H) 0.61 - 1.24 mg/dL   Calcium 8.2 (L) 8.9 - 10.3 mg/dL   GFR, Estimated 39 (L) >60 mL/min   Anion gap 8 5 - 15    Recent Results (from the past 240 hour(s))  SARS Coronavirus 2 by RT PCR (hospital order, performed in [facility] hospital lab) Nasopharyngeal Nasopharyngeal Swab     Status: Abnormal   Collection Time: 04/05/21  1:39 PM   Specimen: Nasopharyngeal Swab  Result Value Ref Range Status   SARS Coronavirus 2 POSITIVE (A) NEGATIVE Final    Comment: RESULT CALLED TO, READ BACK BY AND VERIFIED WITH: COLE,K.  ON 06.22.2022 BY COHEN,K (NOTE) SARS-CoV-2 target nucleic acids are DETECTED  SARS-CoV-2 RNA is generally detectable in upper respiratory specimens  during  the acute phase of infection.  Positive results are indicative  of the presence of the identified virus, but do not rule out bacterial infection or co-infection with other pathogens not detected by the test.  Clinical correlation with patient history and  other diagnostic information is necessary to determine patient infection status.  The  expected result is negative.  Fact Sheet for Patients:   BoilerBrush.com.cy   Fact Sheet for Healthcare Providers:   https://pope.com/    This test is not yet approved or cleared by the [place] FDA and  has been authorized for detection and/or diagnosis of SARS-CoV-2 by FDA under an Emergency Use Authorization (EUA).  This EUA will remain in effect (meaning thi s test can be used) for the duration of  the COVID-19 declaration under Section 564(b)(1) of the Act, 21 U.S.C. section 360-bbb-3(b)(1), unless the authorization is terminated or revoked sooner.  Performed at [facility], [address], [place], [place] [address]   Aerobic/Anaerobic Culture w Gram Stain (surgical/deep wound)     Status: None (Preliminary result)   Collection Time: 04/05/21  4:12 PM   Specimen: PATH Other; Tissue  Result Value Ref Range Status   Specimen Description   Final    SYNOVIAL RIGHT KNEE Performed at [facility], [address], [place], [place] [address]    Special Requests   Final    NONE Performed at [facility], [address] [place], [place] [address]    Gram Stain   Final    RARE WBC PRESENT, PREDOMINANTLY PMN NO ORGANISMS SEEN Performed at [facility], [address] [place], [place] [address]    Culture PENDING  Incomplete   Report Status PENDING  Incomplete  Acid Fast Smear (AFB)     Status: None   Collection Time: 04/05/21  4:12 PM   Specimen: PATH Other; Tissue  Result Value Ref Range Status   AFB Specimen Processing  Concentration  Final   Acid Fast Smear Negative  Final    Comment: (NOTE) Performed At: [facility] [address] [place], [place] [id number] Jolene Schimke MD RD:[id number]    Source (AFB) SYNOVIAL  Final    Comment: RIGHT KNEE Performed at [facility], [address] [place], [place] [address]   SARS CORONAVIRUS 2 (TAT 6-24 HRS) Nasopharyngeal Nasopharyngeal Swab     Status: Abnormal   Collection Time: 04/06/21 12:57 PM   Specimen: Nasopharyngeal Swab  Result Value Ref Range Status   SARS Coronavirus 2 POSITIVE (A) NEGATIVE Final    Comment: (NOTE) SARS-CoV-2 target nucleic acids are DETECTED.  The SARS-CoV-2 RNA is generally detectable in upper and lower respiratory specimens during the acute phase of infection. Positive results are indicative of the presence of SARS-CoV-2 RNA. Clinical correlation with patient history and other diagnostic information is  necessary to determine patient infection status. Positive results do not rule out bacterial infection or co-infection with other viruses.  The expected result is Negative.  Fact Sheet for Patients: HairSlick.no  Fact Sheet for Healthcare Providers: quierodirigir.com  This test is not yet approved or cleared by the [place] FDA and  has been authorized for detection and/or diagnosis of SARS-CoV-2 by FDA under an Emergency Use Authorization (EUA). This EUA will remain  in effect (meaning this test can be used) for the duration of the COVID-19 declaration under Section 564(b)(1) of the Act, 21 U. S.C. section 360bbb-3(b)(1), unless the authorization is terminated or revoked sooner.   Performed at [facility], [address] [place], [place] [address]      Radiology Studies: US RENAL  Result Date: 04/06/2021 CLINICAL DATA:  Acute kidney injury EXAM: RENAL / URINARY TRACT ULTRASOUND COMPLETE COMPARISON:  CT 12/03/2015  FINDINGS: Right Kidney: Renal measurements: 11.3 x 6.7 by  6.9 cm = volume: 272.6 mL. Echogenicity within normal limits. No mass or hydronephrosis visualized. Left Kidney: Renal measurements: 11.9 x 5.9 x 7.3 cm = volume: 269.2 mL. Echogenicity within normal limits. No hydronephrosis. Cyst at the lower pole measuring 2.7 cm. Bladder: Appears normal for degree of bladder distention. Other: None. IMPRESSION: 1. Negative for hydronephrosis. 2. Cyst in the left kidney Electronically Signed   By: Jasmine Pang M.D.   On: 04/06/2021 19:02   [place] EKG SITE RITE  Result Date: 04/06/2021 If Site Rite image not attached, placement could not be confirmed due to current cardiac rhythm.  US RENAL  Final Result    [place] EKG SITE RITE  Final Result    IR Fluoro Guide CV Line Left    (Results Pending)    Scheduled Meds:  aspirin EC  325 mg Oral BID   diltiazem  240 mg Oral Daily   docusate sodium  100 mg Oral BID   fluconazole  100 mg Oral Daily   fluticasone furoate-vilanterol  1 puff Inhalation Daily   metoprolol succinate  100 mg Oral QHS   pantoprazole  40 mg Oral Daily   PRN Meds: acetaminophen, bisacodyl, diphenhydrAMINE, Ipratropium-Albuterol, menthol-cetylpyridinium **OR** phenol, methocarbamol **OR** methocarbamol (ROBAXIN) IV, metoCLOPramide **OR** metoCLOPramide (REGLAN) injection, morphine injection, ondansetron **OR** ondansetron (ZOFRAN) IV, oxyCODONE, polyethylene glycol, sodium phosphate, traMADol Continuous Infusions:  sodium chloride Stopped (04/06/21 1939)   cefTRIAXone (ROCEPHIN)  IV 2 g (04/06/21 1041)   DAPTOmycin (CUBICIN)  IV 800 mg (04/06/21 1154)   methocarbamol (ROBAXIN) IV       LOS: 1 day  Time spent: Greater than 50% of the 35 minute visit was spent in counseling/coordination of care for the patient as laid out in the A&P.   Lewie Chamber, MD [facility] 04/07/2021, 7:48 AM

## 2021-04-08 DIAGNOSIS — R001 Bradycardia, unspecified: Secondary | ICD-10-CM

## 2021-04-08 LAB — BASIC METABOLIC PANEL
Anion gap: 7 (ref 5–15)
BUN: 26 mg/dL — ABNORMAL HIGH (ref 6–20)
CO2: 24 mmol/L (ref 22–32)
Calcium: 8.1 mg/dL — ABNORMAL LOW (ref 8.9–10.3)
Chloride: 109 mmol/L (ref 98–111)
Creatinine, Ser: 1.42 mg/dL — ABNORMAL HIGH (ref 0.61–1.24)
GFR, Estimated: 57 mL/min — ABNORMAL LOW (ref >60)
Glucose, Bld: 150 mg/dL — ABNORMAL HIGH (ref 70–99)
Potassium: 3.7 mmol/L (ref 3.5–5.1)
Sodium: 140 mmol/L (ref 135–145)

## 2021-04-08 LAB — CBC
HCT: 32.8 % — ABNORMAL LOW (ref 39.0–52.0)
Hemoglobin: 10.1 g/dL — ABNORMAL LOW (ref 13.0–17.0)
MCH: 26.2 pg (ref 26.0–34.0)
MCHC: 30.8 g/dL (ref 30.0–36.0)
MCV: 85.2 fL (ref 80.0–100.0)
Platelets: 210 10*3/uL (ref 150–400)
RBC: 3.85 MIL/uL — ABNORMAL LOW (ref 4.22–5.81)
RDW: 19 % — ABNORMAL HIGH (ref 11.5–15.5)
WBC: 2.6 10*3/uL — ABNORMAL LOW (ref 4.0–10.5)
nRBC: 0 % (ref 0.0–0.2)

## 2021-04-08 MED ORDER — SODIUM CHLORIDE 0.9% FLUSH
10.0000 mL | INTRAVENOUS | Status: DC | PRN
  Administered 2021-04-08: 10 mL

## 2021-04-08 MED ORDER — SODIUM CHLORIDE 0.9% FLUSH: 10.0000 mL | Freq: Two times a day (BID) | INTRAVENOUS | Status: DC

## 2021-04-08 MED ORDER — HEPARIN SOD (PORK) LOCK FLUSH 100 UNIT/ML IV SOLN
250.0000 [IU] | INTRAVENOUS | Status: AC | PRN
  Administered 2021-04-08: 250 [IU]
  Filled 2021-04-08: qty 2.5

## 2021-04-08 MED ORDER — DAPTOMYCIN IV (FOR PTA / DISCHARGE USE ONLY): 800.0000 mg | INTRAVENOUS | 0 refills | Status: DC

## 2021-04-08 MED ORDER — CEFTRIAXONE IV (FOR PTA / DISCHARGE USE ONLY): 2.0000 g | INTRAVENOUS | 0 refills | Status: DC

## 2021-04-08 MED ORDER — DILTIAZEM HCL ER COATED BEADS 180 MG PO CP24: 180.0000 mg | ORAL_CAPSULE | Freq: Every day | ORAL | 2 refills | Status: AC

## 2021-04-08 NOTE — Progress Notes (Signed)
Subjective: 3 Days Post-Op Procedure(s) (LRB): IRRIGATION AND DEBRIDEMENT KNEE (Right) Patient reports pain as mild.   Patient seen in rounds for Dr. Lequita Halt. Patient is well, and has had no acute complaints or problems. No acute events overnight. Patient is ready to go home today.  We will start therapy today.   Objective: Vital signs in last 24 hours: Temp:  [97.6 F (36.4 C)-98.1 F (36.7 C)] 98 F (36.7 C) (06/25 1324) Pulse Rate:  [45-54] 45 (06/25 0608) Resp:  [18-20] 20 (06/25 0608) BP: (138-142)/(64-81) 142/81 (06/25 0608) SpO2:  [99 %-100 %] 100 % (06/25 0608)  Intake/Output from previous day:  Intake/Output Summary (Last 24 hours) at 04/08/2021 1146 Last data filed at 04/08/2021 1017 Gross per 24 hour  Intake 1320 ml  Output 1325 ml  Net -5 ml     Intake/Output this shift: Total I/O In: 360 [P.O.:360] Out: -   Labs: Recent Labs    04/05/21 1432 04/06/21 0406 04/07/21 0335 04/08/21 0424  HGB 11.5* 10.1* 10.4* 10.1*   Recent Labs    04/07/21 0335 04/08/21 0424  WBC 3.5* 2.6*  RBC 3.99* 3.85*  HCT 34.4* 32.8*  PLT 273 210   Recent Labs    04/07/21 0335 04/08/21 1020  NA 141 140  K 4.6 3.7  CL 111 109  CO2 22 24  BUN 32* 26*  CREATININE 1.92* 1.42*  GLUCOSE 150* 150*  CALCIUM 8.2* 8.1*   No results for input(s): LABPT, INR in the last 72 hours.  Exam: General - Patient is Alert and Oriented Extremity - Neurologically intact Sensation intact distally Intact pulses distally Dorsiflexion/Plantar flexion intact Dressing - dressing C/D/I. Bulky dressing removed and Aquacel dressings placed.  Motor Function - intact, moving foot and toes well on exam.   Past Medical History:  Diagnosis Date   Allergy    OCC    Anemia    hx of   Asthma    Cataract    small- forming    Cellulitis of lower extremity    "usually RLL; this time it's got up to my lower abdoment" (02/11/2014)-series of antibiotics completed 02-28-14   Diverticulosis     Edema    legs   GERD (gastroesophageal reflux disease)    Hypertension    Infection of right knee (HCC)    OSA (obstructive sleep apnea)    does not use cpap    Pneumonia    20+ years ago   PONV (postoperative nausea and vomiting)    Rheumatoid arthritis (HCC) dx'd ~ 1977   S/P PICC central line placement 03-16-14   right upper arm remains intact.05-11-14 removed 1 week ago.   Sleep apnea    no cpap     Assessment/Plan: 3 Days Post-Op Procedure(s) (LRB): IRRIGATION AND DEBRIDEMENT KNEE (Right) Principal Problem:   Septic arthritis of knee, right (HCC) Active Problems:   AKI (acute kidney injury) (HCC)   HTN (hypertension)  Estimated body mass index is 50.02 kg/m as calculated from the following:   Height as of this encounter: 5\' 8"  (1.727 m).   Weight as of this encounter: 149.2 kg. Advance diet Up with therapy D/C IV fluids    DVT Prophylaxis - Aspirin Weight bearing as tolerated.  Plan is to go Home after hospital stay. Plan for discharge today. Discussed with Dr. , who made recommendations on stopping metoprolol and changing diltiazem dosage. He tells me that the patient is medically stable for discharge as long as this AM's creatinine is  the same or improved.  Cr. 1.42, down from 1.92 yesterday. I appreciate his recommendations and assistance.  OPAT orders signed, and prescription was sent electronically. I did print a rx for the ABX if needed and placed in chart. HHRN arranged for assistance with IV antibiotics.  Hemovac drain to remain in place.  Aquacel dressings to remain in place until follow up. Follow up in the office in 2 weeks.     Dennie Bible, PA-C Orthopedic Surgery [phone] 04/08/2021, 11:46 AM

## 2021-04-08 NOTE — Progress Notes (Signed)
Consult Progress Note    [name]   GNF:[id number]  DOB: [birth year]  DOA: 04/05/2021     2  PCP: Jarome Matin, MD   Hospital Course: [name] is a 60 y.o. male with medical history significant of HTN, B/L LE lymphedema, RA. Presenting with right knee pain. Diagnosed with right septic knee and admitted to ortho service. He has been following with orthopedics outpatient. He had increased pain in his right knee and began to develop drainage. It was recommended that he have a washout. He has successfully completed that procedure. It was noted on his AM labs that he had an elevated Scr. This was also noted on yesterday's labs. He reports no previous history of CKD. Review of previous labs from earlier this year and late 2021 show a normal renal function. He denies any recent medicine changes. He denies any changes on UOP.  He did endorse that his appetite had been poor for a few days prior to surgery.  He had also been taking his home medications as prescribed which include blood pressure medications and diuretics.   Interval History:  No events overnight. Underwent tunneled R IJ with radiology yesterday and tolerated well.  He's ready for going home today.   ROS: Constitutional: negative for chills and fevers, Respiratory: negative for cough and sputum, Cardiovascular: negative for chest pain, and Gastrointestinal: negative for abdominal pain  Assessment & Plan:  AKI - no previous history of known CKD - creat 0.9 to 1.1 in Feb 2022; developed creat up to 3.05 on 6/22 - UOP ok; renal u/s reassuring too and no hydro or obstruction noted - suspect likely component of hypotension (105/58) along with volume depletion with ongoing diuretic use at home and some mild ATN having developed - creat seems to have peaked at 3.05 on 6/22 and is downtrending - repeat BMP on 6/25, as long as same or better renal function, then okay to d/c from medicine standpoint   HTN Bradycardia -  decreased diltiazem from 240 to 180 mg daily and I sent a script for him - discontinue Toprol all together since still brady on 6/25 (HR 45-50 and asymptomatic) - can resume losartan but needs to watch home BP too  Right knee septic arthritis     - per primary team and ID     - pain control per primary team   Asthma      - continue home regimen   RA     - MTX held d/t infection   Chronic lymphedema     - BLE edema is chronic     - no Hx of HF per his account     - continue gentle hydration to deal w/ AKI above; continue to monitor fluid status   COVID 19 Positive     - incidental, no symptoms, follow   Remainder per primary team.  Old records reviewed in assessment of this patient  Antimicrobials: Rocephin Dapto  DVT prophylaxis: SCDs Start: 04/05/21 1834 Place TED hose Start: 04/05/21 1834   Code Status:   Code Status: Full Code  Objective: Blood pressure (!) 142/81, pulse (!) 45, temperature 98 F (36.7 C), temperature source Oral, resp. rate 20, height  (1.727 m), weight (!) 149.2 kg, SpO2 100 %.  Examination: General appearance: alert, cooperative, and no distress Head: Normocephalic, without obvious abnormality, atraumatic Eyes:  EOMI Lungs: clear to auscultation bilaterally Heart: regular rate and rhythm and S1, S2 normal Abdomen:  obese, soft, NT,  ND, BS present Extremities:  B/L LE lymphedema noted. R knee wrapped in ACE/dressing with drain noted Skin: mobility and turgor normal Neurologic: Grossly normal  Data Reviewed: I have personally reviewed following labs and imaging studies Results for orders placed or performed during the hospital encounter of 04/05/21 (from the past 24 hour(s))  CBC     Status: Abnormal   Collection Time: 04/08/21  4:24 AM  Result Value Ref Range   WBC 2.6 (L) 4.0 - 10.5 K/uL   RBC 3.85 (L) 4.22 - 5.81 MIL/uL   Hemoglobin 10.1 (L) 13.0 - 17.0 g/dL   HCT 39.7 (L) 67.3 - 41.9 %   MCV 85.2 80.0 - 100.0 fL   MCH 26.2 26.0  - 34.0 pg   MCHC 30.8 30.0 - 36.0 g/dL   RDW 37.9 (H) 02.4 - 09.7 %   Platelets 210 150 - 400 K/uL   nRBC 0.0 0.0 - 0.2 %    Recent Results (from the past 240 hour(s))  SARS Coronavirus 2 by RT PCR (hospital order, performed in [facility] hospital lab) Nasopharyngeal Nasopharyngeal Swab     Status: Abnormal   Collection Time: 04/05/21  1:39 PM   Specimen: Nasopharyngeal Swab  Result Value Ref Range Status   SARS Coronavirus 2 POSITIVE (A) NEGATIVE Final    Comment: RESULT CALLED TO, READ BACK BY AND VERIFIED WITH: COLE,K. @1503  ON 06.22.2022 BY COHEN,K (NOTE) SARS-CoV-2 target nucleic acids are DETECTED  SARS-CoV-2 RNA is generally detectable in upper respiratory specimens  during the acute phase of infection.  Positive results are indicative  of the presence of the identified virus, but do not rule out bacterial infection or co-infection with other pathogens not detected by the test.  Clinical correlation with patient history and  other diagnostic information is necessary to determine patient infection status.  The expected result is negative.  Fact Sheet for Patients:   06.24.2022   Fact Sheet for Healthcare Providers:   BoilerBrush.com.cy    This test is not yet approved or cleared by the https://pope.com/ FDA and  has been authorized for detection and/or diagnosis of SARS-CoV-2 by FDA under an Emergency Use Authorization (EUA).  This EUA will remain in effect (meaning thi s test can be used) for the duration of  the COVID-19 declaration under Section 564(b)(1) of the Act, 21 U.S.C. section 360-bbb-3(b)(1), unless the authorization is terminated or revoked sooner.  Performed at [facility], [address], [place], [place]   Aerobic/Anaerobic Culture w Gram Stain (surgical/deep wound)     Status: None (Preliminary result)   Collection Time: 04/05/21  4:12 PM   Specimen: PATH Other; Tissue   Result Value Ref Range Status   Specimen Description   Final    SYNOVIAL RIGHT KNEE Performed at [facility], [address], [place], [place]    Special Requests   Final    NONE Performed at [facility], [address], [place], [place]    Gram Stain   Final    RARE WBC PRESENT, PREDOMINANTLY PMN NO ORGANISMS SEEN    Culture   Final    CULTURE REINCUBATED FOR BETTER GROWTH Performed at [facility], [address], [place], [place]    Report Status PENDING  Incomplete  Acid Fast Smear (AFB)     Status: None   Collection Time: 04/05/21  4:12 PM   Specimen: PATH Other; Tissue  Result Value Ref  Range Status   AFB Specimen Processing Concentration  Final   Acid Fast Smear Negative  Final    Comment: (NOTE) Performed At: [facility] [address] [place], [place] [id number] Jolene Schimke MD HY:[id number]    Source (AFB) SYNOVIAL  Final    Comment: RIGHT KNEE Performed at [facility], [address] [place], [place] [address]   SARS CORONAVIRUS 2 (TAT 6-24 HRS) Nasopharyngeal Nasopharyngeal Swab     Status: Abnormal   Collection Time: 04/06/21 12:57 PM   Specimen: Nasopharyngeal Swab  Result Value Ref Range Status   SARS Coronavirus 2 POSITIVE (A) NEGATIVE Final    Comment: (NOTE) SARS-CoV-2 target nucleic acids are DETECTED.  The SARS-CoV-2 RNA is generally detectable in upper and lower respiratory specimens during the acute phase of infection. Positive results are indicative of the presence of SARS-CoV-2 RNA. Clinical correlation with patient history and other diagnostic information is  necessary to determine patient infection status. Positive results do not rule out bacterial infection or co-infection with other viruses.  The expected result is Negative.  Fact Sheet for Patients: HairSlick.no  Fact Sheet for Healthcare  Providers: quierodirigir.com  This test is not yet approved or cleared by the [place] FDA and  has been authorized for detection and/or diagnosis of SARS-CoV-2 by FDA under an Emergency Use Authorization (EUA). This EUA will remain  in effect (meaning this test can be used) for the duration of the COVID-19 declaration under Section 564(b)(1) of the Act, 21 U. S.C. section 360bbb-3(b)(1), unless the authorization is terminated or revoked sooner.   Performed at [facility], [address] [place], [place] [address]      Radiology Studies: US RENAL  Result Date: 04/06/2021 CLINICAL DATA:  Acute kidney injury EXAM: RENAL / URINARY TRACT ULTRASOUND COMPLETE COMPARISON:  CT 12/03/2015 FINDINGS: Right Kidney: Renal measurements: 11.3 x 6.7 by 6.9 cm = volume: 272.6 mL. Echogenicity within normal limits. No mass or hydronephrosis visualized. Left Kidney: Renal measurements: 11.9 x 5.9 x 7.3 cm = volume: 269.2 mL. Echogenicity within normal limits. No hydronephrosis. Cyst at the lower pole measuring 2.7 cm. Bladder: Appears normal for degree of bladder distention. Other: None. IMPRESSION: 1. Negative for hydronephrosis. 2. Cyst in the left kidney Electronically Signed   By: Jasmine Pang M.D.   On: 04/06/2021 19:02   IR Fluoro Guide CV Line Right  Result Date: 04/07/2021 CLINICAL DATA:  Right knee infection, chronic kidney disease and need for central venous catheter for 6 weeks of home IV antibiotic therapy. EXAM: TUNNELED CENTRAL VENOUS CATHETER PLACEMENT WITH ULTRASOUND AND FLUOROSCOPIC GUIDANCE ANESTHESIA/SEDATION: None MEDICATIONS: None FLUOROSCOPY TIME:  6 seconds.  5.0 mGy. PROCEDURE: The procedure, risks, benefits, and alternatives were explained to the patient. Questions regarding the procedure were encouraged and answered. The patient understands and consents to the procedure. A timeout was performed prior to initiating the procedure. The right neck and  chest were prepped with chlorhexidine in a sterile fashion, and a sterile drape was applied covering the operative field. Maximum barrier sterile technique with sterile gowns and gloves were used for the procedure. Local anesthesia was provided with 1% lidocaine. Ultrasound was used to confirm patency of the right internal jugular vein. After creating a small venotomy incision, a 21 gauge needle was advanced into the right internal jugular vein under direct, real-time ultrasound guidance. Ultrasound image documentation was performed. After securing guidewire access, a 6 French peel-away sheath was placed. A wire was kinked to measure  appropriate catheter length. A 6 [place], dual-lumen power line was chosen for placement. This was tunneled in a retrograde fashion from the chest wall to the venotomy incision. The catheter was cut to 24 cm based on guidewire measurement. The catheter was then placed through the sheath and the sheath removed. Final catheter positioning was confirmed and documented with a fluoroscopic spot image. The catheter was aspirated and flushed with saline. The venotomy incision was closed with subcuticular 4-0 Vicryl. Dermabond was applied to the incision. The catheter exit site was secured with 0-Prolene retention sutures. COMPLICATIONS: None.  No pneumothorax. FINDINGS: After catheter placement, the tip lies at the SVC/RA junction. The catheter aspirates normally and is ready for immediate use. IMPRESSION: Placement of tunneled central venous catheter via the right internal jugular vein. The catheter tip lies at the SVC/RA junction. The catheter is ready for immediate use. Electronically Signed   By: Irish Lack M.D.   On: 04/07/2021 17:02   IR US Guide Vasc Access Right  Result Date: 04/07/2021 CLINICAL DATA:  Right knee infection, chronic kidney disease and need for central venous catheter for 6 weeks of home IV antibiotic therapy. EXAM: TUNNELED CENTRAL VENOUS CATHETER PLACEMENT WITH  ULTRASOUND AND FLUOROSCOPIC GUIDANCE ANESTHESIA/SEDATION: None MEDICATIONS: None FLUOROSCOPY TIME:  6 seconds.  5.0 mGy. PROCEDURE: The procedure, risks, benefits, and alternatives were explained to the patient. Questions regarding the procedure were encouraged and answered. The patient understands and consents to the procedure. A timeout was performed prior to initiating the procedure. The right neck and chest were prepped with chlorhexidine in a sterile fashion, and a sterile drape was applied covering the operative field. Maximum barrier sterile technique with sterile gowns and gloves were used for the procedure. Local anesthesia was provided with 1% lidocaine. Ultrasound was used to confirm patency of the right internal jugular vein. After creating a small venotomy incision, a 21 gauge needle was advanced into the right internal jugular vein under direct, real-time ultrasound guidance. Ultrasound image documentation was performed. After securing guidewire access, a 6 French peel-away sheath was placed. A wire was kinked to measure appropriate catheter length. A 6 [place], dual-lumen power line was chosen for placement. This was tunneled in a retrograde fashion from the chest wall to the venotomy incision. The catheter was cut to 24 cm based on guidewire measurement. The catheter was then placed through the sheath and the sheath removed. Final catheter positioning was confirmed and documented with a fluoroscopic spot image. The catheter was aspirated and flushed with saline. The venotomy incision was closed with subcuticular 4-0 Vicryl. Dermabond was applied to the incision. The catheter exit site was secured with 0-Prolene retention sutures. COMPLICATIONS: None.  No pneumothorax. FINDINGS: After catheter placement, the tip lies at the SVC/RA junction. The catheter aspirates normally and is ready for immediate use. IMPRESSION: Placement of tunneled central venous catheter via the right internal jugular vein. The  catheter tip lies at the SVC/RA junction. The catheter is ready for immediate use. Electronically Signed   By: Irish Lack M.D.   On: 04/07/2021 17:02   [place] EKG SITE RITE  Result Date: 04/06/2021 If Site Rite image not attached, placement could not be confirmed due to current cardiac rhythm.  IR Fluoro Guide CV Line Right  Final Result    IR US Guide Vasc Access Right  Final Result    US RENAL  Final Result    [place] EKG SITE RITE  Final Result      Scheduled Meds:  aspirin EC  325 mg Oral BID   diltiazem  180 mg Oral Daily   docusate sodium  100 mg Oral BID   fluconazole  100 mg Oral Daily   fluticasone furoate-vilanterol  1 puff Inhalation Daily   pantoprazole  40 mg Oral Daily   PRN Meds: acetaminophen, bisacodyl, diphenhydrAMINE, Ipratropium-Albuterol, menthol-cetylpyridinium **OR** phenol, methocarbamol **OR** methocarbamol (ROBAXIN) IV, metoCLOPramide **OR** metoCLOPramide (REGLAN) injection, morphine injection, ondansetron **OR** ondansetron (ZOFRAN) IV, oxyCODONE, polyethylene glycol, sodium phosphate, traMADol Continuous Infusions:  sodium chloride Stopped (04/06/21 1939)   cefTRIAXone (ROCEPHIN)  IV 2 g (04/08/21 0818)   DAPTOmycin (CUBICIN)  IV 800 mg (04/07/21 2052)   methocarbamol (ROBAXIN) IV       LOS: 2 days  Time spent: Greater than 50% of the 35 minute visit was spent in counseling/coordination of care for the patient as laid out in the A&P.   Lewie Chamber, MD [facility] 04/08/2021, 9:44 AM

## 2021-04-09 ENCOUNTER — Encounter: Payer: Self-pay | Admitting: Infectious Diseases

## 2021-04-10 NOTE — Discharge Summary (Signed)
Physician Discharge Summary   Patient ID: [name] MRN: [id number] DOB/AGE: 06-16-1961 60 y.o.  Admit date: 04/05/2021 Discharge date: 04/08/2021  Primary Diagnosis: Septic arthritis, right knee   Admission Diagnoses:  Past Medical History:  Diagnosis Date   Allergy    OCC    Anemia    hx of   Asthma    Cataract    small- forming    Cellulitis of lower extremity    "usually RLL; this time it's got up to my lower abdoment" (02/11/2014)-series of antibiotics completed 02-28-14   Diverticulosis    Edema    legs   GERD (gastroesophageal reflux disease)    Hypertension    Infection of right knee (HCC)    OSA (obstructive sleep apnea)    does not use cpap    Pneumonia    20+ years ago   PONV (postoperative nausea and vomiting)    Rheumatoid arthritis ([place]) dx'd ~ 1977   S/P PICC central line placement 03-16-14   right upper arm remains intact.05-11-14 removed 1 week ago.   Sleep apnea    no cpap    Discharge Diagnoses:   Principal Problem:   Septic arthritis of knee, right (HCC) Active Problems:   AKI (acute kidney injury) ([place])   HTN (hypertension)  Estimated body mass index is 50.02 kg/m as calculated from the following:   Height as of this encounter: _0  (1.727 m).   Weight as of this encounter: 149.2 kg.  Procedure:  Procedure(s) (LRB): IRRIGATION AND DEBRIDEMENT KNEE (Right)   Consults: ID and hospitalist service  HPI: The patient is a 60 year old male with a long complex history in regards to his right knee.  He had reimplantation of total knee arthroplasty performed approximately 3 months ago.  24-36 hours ago, he began having drainage from a pinpoint area on the inferior aspect of the incision line.  He began feeling ill and presented to the office yesterday afternoon with seropurulent drainage from fluid, which was aspirated from his subcutaneous space.  He is subsequently brought to the hospital today for formal irrigation and debridement of the  knee.  Laboratory Data: Admission on 04/05/2021, Discharged on 04/08/2021  Component Date Value Ref Range Status   SARS Coronavirus 2 04/05/2021 POSITIVE (A) NEGATIVE Final   Comment: RESULT CALLED TO, READ BACK BY AND VERIFIED WITH: COLE,K. _1  ON 06.22.2022 BY COHEN,K (NOTE) SARS-CoV-2 target nucleic acids are DETECTED  SARS-CoV-2 RNA is generally detectable in upper respiratory specimens  during the acute phase of infection.  Positive results are indicative  of the presence of the identified virus, but do not rule out bacterial infection or co-infection with other pathogens not detected by the test.  Clinical correlation with patient history and  other diagnostic information is necessary to determine patient infection status.  The expected result is negative.  Fact Sheet for Patients:   StrictlyIdeas.no   Fact Sheet for Healthcare Providers:   BankingDealers.co.za    This test is not yet approved or cleared by the [place] FDA and  has been authorized for detection and/or diagnosis of SARS-CoV-2 by FDA under an Emergency Use Authorization (EUA).  This EUA will remain in effect (meaning thi                          s test can be used) for the duration of  the COVID-19 declaration under Section 564(b)(1) of the Act, 21 U.S.C. section 360-bbb-3(b)(1), unless the  authorization is terminated or revoked sooner.  Performed at [facility], [place] [address] [place], [place] [address]    Sodium 04/05/2021 137  135 - 145 mmol/L Final   Potassium 04/05/2021 3.9  3.5 - 5.1 mmol/L Final   Chloride 04/05/2021 106  98 - 111 mmol/L Final   CO2 04/05/2021 22  22 - 32 mmol/L Final   Glucose, Bld 04/05/2021 111 (A) 70 - 99 mg/dL Final   Glucose reference range applies only to samples taken after fasting for at least 8 hours.   BUN 04/05/2021 39 (A) 6 - 20 mg/dL Final   Creatinine, Ser 04/05/2021 3.05 (A) 0.61 - 1.24  mg/dL Final   Calcium 04/05/2021 8.5 (A) 8.9 - 10.3 mg/dL Final   GFR, Estimated 04/05/2021 23 (A) >60 mL/min Final   Comment: (NOTE) Calculated using the CKD-EPI Creatinine Equation (2021)    Anion gap 04/05/2021 9  5 - 15 Final   Performed at [facility], [place] [address], [place], [place] [address]   WBC 04/05/2021 9.6  4.0 - 10.5 K/uL Final   RBC 04/05/2021 4.35  4.22 - 5.81 MIL/uL Final   Hemoglobin 04/05/2021 11.5 (A) 13.0 - 17.0 g/dL Final   HCT 04/05/2021 37.2 (A) 39.0 - 52.0 % Final   MCV 04/05/2021 85.5  80.0 - 100.0 fL Final   MCH 04/05/2021 26.4  26.0 - 34.0 pg Final   MCHC 04/05/2021 30.9  30.0 - 36.0 g/dL Final   RDW 04/05/2021 19.1 (A) 11.5 - 15.5 % Final   Platelets 04/05/2021 336  150 - 400 K/uL Final   nRBC 04/05/2021 0.0  0.0 - 0.2 % Final   Neutrophils Relative % 04/05/2021 91  % Final   Neutro Abs 04/05/2021 8.9 (A) 1.7 - 7.7 K/uL Final   Lymphocytes Relative 04/05/2021 3  % Final   Lymphs Abs 04/05/2021 0.3 (A) 0.7 - 4.0 K/uL Final   Monocytes Relative 04/05/2021 1  % Final   Monocytes Absolute 04/05/2021 0.1  0.1 - 1.0 K/uL Final   Eosinophils Relative 04/05/2021 3  % Final   Eosinophils Absolute 04/05/2021 0.2  0.0 - 0.5 K/uL Final   Basophils Relative 04/05/2021 1  % Final   Basophils Absolute 04/05/2021 0.1  0.0 - 0.1 K/uL Final   Immature Granulocytes 04/05/2021 1  % Final   Abs Immature Granulocytes 04/05/2021 0.05  0.00 - 0.07 K/uL Final   Ovalocytes 04/05/2021 PRESENT   Final   Performed at [facility], [place] [address], [place], [place] [address]   Specimen Description 04/05/2021    Final                   Value:SYNOVIAL RIGHT KNEE Performed at [place] [address] [place], [place] [address]    Special Requests 04/05/2021    Final                   Value:NONE Performed at [facility], [place] [address], [place], [place] [address]    Gram Stain 04/05/2021    Final                    Value:RARE WBC PRESENT, PREDOMINANTLY PMN NO ORGANISMS SEEN Performed at [facility], [place] [address] [place], [place] [address]    Culture 04/05/2021    Final  Value:FEW PASTEURELLA DAGMATIS Usually susceptible to penicillin and other beta lactam agents,quinolones,macrolides and tetracyclines. NO ANAEROBES ISOLATED; CULTURE IN PROGRESS FOR 5 DAYS    Report Status 04/05/2021 PENDING   Incomplete   AFB Specimen Processing 04/05/2021 Concentration   Final   Acid Fast Smear 04/05/2021 Negative   Final   Comment: (NOTE) Performed At: [facility] [place], [place] [id number] Rush Farmer MD FW:[id number]    Source (AFB) 04/05/2021 SYNOVIAL   Final   Comment: RIGHT KNEE Performed at [facility], [place] [address] [place], [place] [address]    WBC 04/06/2021 5.5  4.0 - 10.5 K/uL Final   RBC 04/06/2021 3.78 (A) 4.22 - 5.81 MIL/uL Final   Hemoglobin 04/06/2021 10.1 (A) 13.0 - 17.0 g/dL Final   HCT 04/06/2021 32.4 (A) 39.0 - 52.0 % Final   MCV 04/06/2021 85.7  80.0 - 100.0 fL Final   MCH 04/06/2021 26.7  26.0 - 34.0 pg Final   MCHC 04/06/2021 31.2  30.0 - 36.0 g/dL Final   RDW 04/06/2021 19.2 (A) 11.5 - 15.5 % Final   Platelets 04/06/2021 282  150 - 400 K/uL Final   nRBC 04/06/2021 0.0  0.0 - 0.2 % Final   Performed at [facility], [place] [address], [place], [place] [address]   Sodium 04/06/2021 137  135 - 145 mmol/L Final   Potassium 04/06/2021 4.0  3.5 - 5.1 mmol/L Final   Chloride 04/06/2021 108  98 - 111 mmol/L Final   CO2 04/06/2021 23  22 - 32 mmol/L Final   Glucose, Bld 04/06/2021 116 (A) 70 - 99 mg/dL Final   Glucose reference range applies only to samples taken after fasting for at least 8 hours.   BUN 04/06/2021 36 (A) 6 - 20 mg/dL Final   Creatinine, Ser 04/06/2021 2.93 (A) 0.61 - 1.24 mg/dL Final   Calcium 04/06/2021 7.9 (A) 8.9 - 10.3 mg/dL Final   GFR, Estimated 04/06/2021  24 (A) >60 mL/min Final   Comment: (NOTE) Calculated using the CKD-EPI Creatinine Equation (2021)    Anion gap 04/06/2021 6  5 - 15 Final   Performed at [facility], [place] [address] [place], [place] [address]   Albumin 04/06/2021 2.7 (A) 3.5 - 5.0 g/dL Final   Performed at [place] [address], [place], [place] [address]   Sed Rate 04/06/2021 67 (A) 0 - 16 mm/hr Final   Performed at [facility], [place] [address], [place], [place] [address]   CRP 04/06/2021 26.6 (A) <1.0 mg/dL Final   Performed at [place] [address] [place], [place] [address]   SARS Coronavirus 2 04/06/2021 POSITIVE (A) NEGATIVE Final   Comment: (NOTE) SARS-CoV-2 target nucleic acids are DETECTED.  The SARS-CoV-2 RNA is generally detectable in upper and lower respiratory specimens during the acute phase of infection. Positive results are indicative of the presence of SARS-CoV-2 RNA. Clinical correlation with patient history and other diagnostic information is  necessary to determine patient infection status. Positive results do not rule out bacterial infection or co-infection with other viruses.  The expected result is Negative.  Fact Sheet for Patients: SugarRoll.be  Fact Sheet for Healthcare Providers: https://www.woods-mathews.com/  This test is not yet approved or cleared by the [place] FDA and  has been authorized for detection and/or diagnosis of SARS-CoV-2 by FDA under an Emergency Use Authorization (EUA). This EUA will remain  in effect (meaning this test  can be used) for the duration of the COVID-19 declaration under Section 564(b)(1) of the Act, 21 U.                          S.C. section 360bbb-3(b)(1), unless the authorization is terminated or revoked sooner.   Performed at [facility], [place] [address], [place], [place] [address]    Total CK 04/06/2021  37 (A) 49 - 397 U/L Final   Performed at [facility], [place] [address] [place], [place] [address]   WBC 04/07/2021 3.5 (A) 4.0 - 10.5 K/uL Final   RBC 04/07/2021 3.99 (A) 4.22 - 5.81 MIL/uL Final   Hemoglobin 04/07/2021 10.4 (A) 13.0 - 17.0 g/dL Final   HCT 04/07/2021 34.4 (A) 39.0 - 52.0 % Final   MCV 04/07/2021 86.2  80.0 - 100.0 fL Final   MCH 04/07/2021 26.1  26.0 - 34.0 pg Final   MCHC 04/07/2021 30.2  30.0 - 36.0 g/dL Final   RDW 04/07/2021 19.1 (A) 11.5 - 15.5 % Final   Platelets 04/07/2021 273  150 - 400 K/uL Final   nRBC 04/07/2021 0.0  0.0 - 0.2 % Final   Performed at [facility], [place] [address] [place], [place] [address]   Sodium 04/07/2021 141  135 - 145 mmol/L Final   Potassium 04/07/2021 4.6  3.5 - 5.1 mmol/L Final   Chloride 04/07/2021 111  98 - 111 mmol/L Final   CO2 04/07/2021 22  22 - 32 mmol/L Final   Glucose, Bld 04/07/2021 150 (A) 70 - 99 mg/dL Final   Glucose reference range applies only to samples taken after fasting for at least 8 hours.   BUN 04/07/2021 32 (A) 6 - 20 mg/dL Final   Creatinine, Ser 04/07/2021 1.92 (A) 0.61 - 1.24 mg/dL Final   [place] CHECK NOTED   Calcium 04/07/2021 8.2 (A) 8.9 - 10.3 mg/dL Final   GFR, Estimated 04/07/2021 39 (A) >60 mL/min Final   Comment: (NOTE) Calculated using the CKD-EPI Creatinine Equation (2021)    Anion gap 04/07/2021 8  5 - 15 Final   Performed at [facility], [place] [address] [place], [place] [address]   WBC 04/08/2021 2.6 (A) 4.0 - 10.5 K/uL Final   RBC 04/08/2021 3.85 (A) 4.22 - 5.81 MIL/uL Final   Hemoglobin 04/08/2021 10.1 (A) 13.0 - 17.0 g/dL Final   HCT 04/08/2021 32.8 (A) 39.0 - 52.0 % Final   MCV 04/08/2021 85.2  80.0 - 100.0 fL Final   MCH 04/08/2021 26.2  26.0 - 34.0 pg Final   MCHC 04/08/2021 30.8  30.0 - 36.0 g/dL Final   RDW 04/08/2021 19.0 (A) 11.5 - 15.5 % Final   Platelets 04/08/2021 210  150 - 400 K/uL Final   nRBC 04/08/2021 0.0  0.0 -  0.2 % Final   Performed at [facility], [place] [address], [place], [place] [address]   Sodium 04/08/2021 140  135 - 145 mmol/L Final   Potassium 04/08/2021 3.7  3.5 - 5.1 mmol/L Final   [place] CHECK NOTED   Chloride 04/08/2021 109  98 - 111 mmol/L Final   CO2 04/08/2021 24  22 - 32 mmol/L Final   Glucose, Bld 04/08/2021 150 (A) 70 - 99 mg/dL Final   Glucose reference range applies only to samples taken after fasting for at least 8 hours.   BUN 04/08/2021 26 (A) 6 - 20 mg/dL Final  Creatinine, Ser 04/08/2021 1.42 (A) 0.61 - 1.24 mg/dL Final   Calcium 04/08/2021 8.1 (A) 8.9 - 10.3 mg/dL Final   GFR, Estimated 04/08/2021 57 (A) >60 mL/min Final   Comment: (NOTE) Calculated using the CKD-EPI Creatinine Equation (2021)    Anion gap 04/08/2021 7  5 - 15 Final   Performed at [facility], [place] [address], [place], [place] [address]     X-Rays:US RENAL  Result Date: 04/06/2021 CLINICAL DATA:  Acute kidney injury EXAM: RENAL / URINARY TRACT ULTRASOUND COMPLETE COMPARISON:  CT 12/03/2015 FINDINGS: Right Kidney: Renal measurements: 11.3 x 6.7 by 6.9 cm = volume: 272.6 mL. Echogenicity within normal limits. No mass or hydronephrosis visualized. Left Kidney: Renal measurements: 11.9 x 5.9 x 7.3 cm = volume: 269.2 mL. Echogenicity within normal limits. No hydronephrosis. Cyst at the lower pole measuring 2.7 cm. Bladder: Appears normal for degree of bladder distention. Other: None. IMPRESSION: 1. Negative for hydronephrosis. 2. Cyst in the left kidney Electronically Signed   By: Donavan Foil M.D.   On: 04/06/2021 19:02   IR Fluoro Guide CV Line Right  Result Date: 04/07/2021 CLINICAL DATA:  Right knee infection, chronic kidney disease and need for central venous catheter for 6 weeks of home IV antibiotic therapy. EXAM: TUNNELED CENTRAL VENOUS CATHETER PLACEMENT WITH ULTRASOUND AND FLUOROSCOPIC GUIDANCE ANESTHESIA/SEDATION: None MEDICATIONS: None FLUOROSCOPY TIME:  6  seconds.  5.0 mGy. PROCEDURE: The procedure, risks, benefits, and alternatives were explained to the patient. Questions regarding the procedure were encouraged and answered. The patient understands and consents to the procedure. A timeout was performed prior to initiating the procedure. The right neck and chest were prepped with chlorhexidine in a sterile fashion, and a sterile drape was applied covering the operative field. Maximum barrier sterile technique with sterile gowns and gloves were used for the procedure. Local anesthesia was provided with 1% lidocaine. Ultrasound was used to confirm patency of the right internal jugular vein. After creating a small venotomy incision, a 21 gauge needle was advanced into the right internal jugular vein under direct, real-time ultrasound guidance. Ultrasound image documentation was performed. After securing guidewire access, a 6 French peel-away sheath was placed. A wire was kinked to measure appropriate catheter length. A 6 [place], dual-lumen power line was chosen for placement. This was tunneled in a retrograde fashion from the chest wall to the venotomy incision. The catheter was cut to 24 cm based on guidewire measurement. The catheter was then placed through the sheath and the sheath removed. Final catheter positioning was confirmed and documented with a fluoroscopic spot image. The catheter was aspirated and flushed with saline. The venotomy incision was closed with subcuticular 4-0 Vicryl. Dermabond was applied to the incision. The catheter exit site was secured with 0-Prolene retention sutures. COMPLICATIONS: None.  No pneumothorax. FINDINGS: After catheter placement, the tip lies at the SVC/RA junction. The catheter aspirates normally and is ready for immediate use. IMPRESSION: Placement of tunneled central venous catheter via the right internal jugular vein. The catheter tip lies at the SVC/RA junction. The catheter is ready for immediate use. Electronically  Signed   By: Aletta Edouard M.D.   On: 04/07/2021 17:02   IR US Guide Vasc Access Right  Result Date: 04/07/2021 CLINICAL DATA:  Right knee infection, chronic kidney disease and need for central venous catheter for 6 weeks of home IV antibiotic therapy. EXAM: TUNNELED CENTRAL VENOUS CATHETER PLACEMENT WITH ULTRASOUND AND FLUOROSCOPIC GUIDANCE ANESTHESIA/SEDATION: None MEDICATIONS: None FLUOROSCOPY TIME:  6 seconds.  5.0 mGy. PROCEDURE: The procedure, risks, benefits, and alternatives were explained to the patient. Questions regarding the procedure were encouraged and answered. The patient understands and consents to the procedure. A timeout was performed prior to initiating the procedure. The right neck and chest were prepped with chlorhexidine in a sterile fashion, and a sterile drape was applied covering the operative field. Maximum barrier sterile technique with sterile gowns and gloves were used for the procedure. Local anesthesia was provided with 1% lidocaine. Ultrasound was used to confirm patency of the right internal jugular vein. After creating a small venotomy incision, a 21 gauge needle was advanced into the right internal jugular vein under direct, real-time ultrasound guidance. Ultrasound image documentation was performed. After securing guidewire access, a 6 French peel-away sheath was placed. A wire was kinked to measure appropriate catheter length. A 6 [place], dual-lumen power line was chosen for placement. This was tunneled in a retrograde fashion from the chest wall to the venotomy incision. The catheter was cut to 24 cm based on guidewire measurement. The catheter was then placed through the sheath and the sheath removed. Final catheter positioning was confirmed and documented with a fluoroscopic spot image. The catheter was aspirated and flushed with saline. The venotomy incision was closed with subcuticular 4-0 Vicryl. Dermabond was applied to the incision. The catheter exit site was  secured with 0-Prolene retention sutures. COMPLICATIONS: None.  No pneumothorax. FINDINGS: After catheter placement, the tip lies at the SVC/RA junction. The catheter aspirates normally and is ready for immediate use. IMPRESSION: Placement of tunneled central venous catheter via the right internal jugular vein. The catheter tip lies at the SVC/RA junction. The catheter is ready for immediate use. Electronically Signed   By: Aletta Edouard M.D.   On: 04/07/2021 17:02   [place] EKG SITE RITE  Result Date: 04/06/2021 If Site Rite image not attached, placement could not be confirmed due to current cardiac rhythm.   EKG: Orders placed or performed during the hospital encounter of 12/06/20   EKG 12 lead per protocol   EKG 12 lead per protocol     Hospital Course: ESTELL DILLINGER is a 60 y.o. who was admitted to [facility]. They were brought to the operating room on 04/05/2021 and underwent Procedure(s): [place].  Patient tolerated the procedure well and was later transferred to the recovery room and then to the orthopaedic floor for postoperative care. They were given PO and IV analgesics for pain control following their surgery. They were given 24 hours of postoperative antibiotics of  Anti-infectives (From admission, onward)    Start     Dose/Rate Route Frequency Ordered Stop   04/08/21 0000  daptomycin (CUBICIN) IVPB        800 mg Intravenous Every 24 hours 04/08/21 1010 05/19/21 2359   04/08/21 0000  cefTRIAXone (ROCEPHIN) IVPB        2 g Intravenous Every 24 hours 04/08/21 1010 05/19/21 2359   04/07/21 0000  cefTRIAXone (ROCEPHIN) IVPB  Status:  Discontinued        2 g Intravenous Every 24 hours 04/07/21 1532 04/08/21    04/07/21 0000  daptomycin (CUBICIN) IVPB  Status:  Discontinued        800 mg Intravenous Every 24 hours 04/07/21 1533 04/08/21    04/06/21 1030  DAPTOmycin (CUBICIN) 800 mg in sodium chloride 0.9 % IVPB  Status:  Discontinued        8 mg/kg   100.7 kg (Adjusted) 132 mL/hr  over 30 Minutes Intravenous Daily 04/06/21 0938 04/08/21 1806   04/06/21 1030  cefTRIAXone (ROCEPHIN) 2 g in sodium chloride 0.9 % 100 mL IVPB  Status:  Discontinued        2 g 200 mL/hr over 30 Minutes Intravenous Every 24 hours 04/06/21 0938 04/08/21 1806   04/06/21 1000  fluconazole (DIFLUCAN) tablet 100 mg  Status:  Discontinued        100 mg Oral Daily 04/05/21 1833 04/08/21 1806   04/05/21 2200  ceFAZolin (ANCEF) IVPB 2g/100 mL premix        2 g 200 mL/hr over 30 Minutes Intravenous Every 6 hours 04/05/21 1833 04/06/21 1940   04/05/21 1345  ceFAZolin (ANCEF) IVPB 3g/100 mL premix        3 g 200 mL/hr over 30 Minutes Intravenous On call to O.R. 04/05/21 1338 04/05/21 1612      and started on DVT prophylaxis in the form of Aspirin.   PT and OT were ordered for total joint protocol. Discharge planning consulted to help with postop disposition and equipment needs. On POD #1, creatinine and GFR were markedly elevated from February. Hospitalists were consulted for evaluation. Infectious disease was also consulted for antibiotic management. PICC line was placed by interventional radiology due to reduced creatinine clearance. Per hospitalists recommendations, medications were held and gentle hydration was started. Creatinine was improved at 1.92 on POD #2, and ID was awaiting intraop cultures before placing final recs. On POD #3, creatinine was improved at 1.42 and patient was medically stable for discharge per Dr. Sabino Gasser (hospitalist). IV recommendations were in as well (daptomycin and ceftriaxone). Hemovac was left in place, to be removed at first outpatient visit. HHRN was arranged for OPAT and patient was discharged to home in stable condition.   Diet: Cardiac diet Activity: WBAT Follow-up: F/u in 1 week for drain removal Disposition: Home Discharged Condition: stable   Discharge Instructions     [facility] pharmacist to adjust dose for  Vancomycin, Aminoglycosides and other anti-infective therapies as requested by physician.   Complete by: As directed    [facility] to provide Cath Flo 2m   Complete by: As directed    Administer for PICC line occlusion and as ordered by physician for other access device issues.   Anaphylaxis Kit: Provided to treat any anaphylactic reaction to the medication being provided to the patient if First Dose or when requested by physician   Complete by: As directed    Epinephrine 156mml vial / amp: Administer 0.63m5m0.63ml563mubcutaneously once for moderate to severe anaphylaxis, nurse to call physician and pharmacy when reaction occurs and call 911 if needed for immediate care   Diphenhydramine 50mg71mIV vial: Administer 25-50mg 54mM PRN for first dose reaction, rash, itching, mild reaction, nurse to call physician and pharmacy when reaction occurs   Sodium Chloride 0.9% NS 500ml I9mdminister if needed for hypovolemic blood pressure drop or as ordered by physician after call to physician with anaphylactic reaction   Call MD / Call 911   Complete by: As directed    If you experience chest pain or shortness of breath, CALL 911 and be transported to the hospital emergency room.  If you develope a fever above 101 F, pus (white drainage) or increased drainage or redness at the wound, or calf pain, call your surgeon's office.   Change dressing   Complete by: As directed    You may remove the bulky bandage (ACE wrap and gauze) two  days after surgery. You will have an adhesive waterproof bandage underneath. Leave this in place until your first follow-up appointment.   Change dressing on IV access line weekly and PRN   Complete by: As directed    Constipation Prevention   Complete by: As directed    Drink plenty of fluids.  Prune juice may be helpful.  You may use a stool softener, such as Colace (over the counter) 100 mg twice a day.  Use MiraLax (over the counter) for constipation as needed.    Diet - low sodium heart healthy   Complete by: As directed    Do not put a pillow under the knee. Place it under the heel.   Complete by: As directed    Driving restrictions   Complete by: As directed    No driving for two weeks   Flush IV access with Sodium Chloride 0.9% and Heparin 10 units/ml or 100 units/ml   Complete by: As directed    Home infusion instructions - [facility]   Complete by: As directed    Instructions: Flush IV access with Sodium Chloride 0.9% and Heparin 10units/ml or 100units/ml   Change dressing on IV access line: Weekly and PRN   Instructions Cath Flo 68m: Administer for PICC Line occlusion and as ordered by physician for other access device   [facility] pharmacist to adjust dose for: Vancomycin, Aminoglycosides and other anti-infective therapies as requested by physician   Method of administration may be changed at the discretion of home infusion pharmacist based upon assessment of the patient and/or caregiver's ability to self-administer the medication ordered   Complete by: As directed    Post-operative opioid taper instructions:   Complete by: As directed    POST-OPERATIVE OPIOID TAPER INSTRUCTIONS: It is important to wean off of your opioid medication as soon as possible. If you do not need pain medication after your surgery it is ok to stop day one. Opioids include: Codeine, Hydrocodone(Norco, Vicodin), Oxycodone(Percocet, oxycontin) and hydromorphone amongst others.  Long term and even short term use of opiods can cause: Increased pain response Dependence Constipation Depression Respiratory depression And more.  Withdrawal symptoms can include Flu like symptoms Nausea, vomiting And more Techniques to manage these symptoms Hydrate well Eat regular healthy meals Stay active Use relaxation techniques(deep breathing, meditating, yoga) Do Not substitute Alcohol to help with tapering If you have been on opioids for less than  two weeks and do not have pain than it is ok to stop all together.  Plan to wean off of opioids This plan should start within one week post op of your joint replacement. Maintain the same interval or time between taking each dose and first decrease the dose.  Cut the total daily intake of opioids by one tablet each day Next start to increase the time between doses. The last dose that should be eliminated is the evening dose.      TED hose   Complete by: As directed    Use stockings (TED hose) for three weeks on both leg(s).  You may remove them at night for sleeping.   Weight bearing as tolerated   Complete by: As directed       Allergies as of 04/08/2021       Reactions   Avelox [moxifloxacin Hcl In Nacl] Hives, Shortness Of Breath, Itching   Tolerates Cipro   Chlorhexidine    Skin red and burning after skin wash   Doxycycline Itching   Sulfa  Antibiotics Itching   Vancomycin Hives, Itching   E.e.s. [erythromycin] Rash   Erythrocin Rash   Penicillins Rash   Tolerated augmentin  Tolerated Cephalosporin Date: 04/06/21.   Sulfasalazine Rash        Medication List     STOP taking these medications    diphenoxylate-atropine 2.5-0.025 MG/5ML liquid Commonly known as: LOMOTIL   metoprolol succinate 100 MG 24 hr tablet Commonly known as: TOPROL-XL       TAKE these medications    amoxicillin-clavulanate 875-125 MG tablet Commonly known as: AUGMENTIN Take 1 tablet by mouth 2 (two) times daily.   aspirin 325 MG EC tablet Take 1 tablet (325 mg total) by mouth 2 (two) times daily for 19 days. Then take one 81 mg aspirin once a day for three weeks. Then discontinue aspirin.   cefTRIAXone  IVPB Commonly known as: ROCEPHIN Inject 2 g into the vein daily. Indication:  Prosthetic joint infection  First Dose: No Last Day of Therapy:  05/18/2021 Labs - Once weekly:  CBC/D and BMP, Labs - Every other week:  ESR and CRP Method of administration: IV Push Method of  administration may be changed at the discretion of home infusion pharmacist based upon assessment of the patient and/or caregiver's ability to self-administer the medication ordered.   cholecalciferol 25 MCG (1000 UNIT) tablet Commonly known as: VITAMIN D3 Take 1,000 Units by mouth daily.   Combivent Respimat 20-100 MCG/ACT Aers respimat Generic drug: Ipratropium-Albuterol Inhale 1 puff into the lungs every 6 (six) hours as needed for wheezing or shortness of breath.   daptomycin  IVPB Commonly known as: CUBICIN Inject 800 mg into the vein daily. Indication:  Prosthetic joint infection  First Dose: No Last Day of Therapy:  05/18/2021 Labs - Once weekly:  CBC/D, BMP, and CPK Labs - Every other week:  ESR and CRP Method of administration: IV Push Method of administration may be changed at the discretion of home infusion pharmacist based upon assessment of the patient and/or caregiver's ability to self-administer the medication ordered.   diltiazem 180 MG 24 hr capsule Commonly known as: CARDIZEM CD Take 1 capsule (180 mg total) by mouth daily. What changed:  medication strength how much to take   Fluticasone-Salmeterol 250-50 MCG/DOSE Aepb Commonly known as: ADVAIR Inhale 1 puff into the lungs daily.   losartan 100 MG tablet Commonly known as: COZAAR Take 100 mg by mouth daily.   methocarbamol 500 MG tablet Commonly known as: ROBAXIN Take 1 tablet (500 mg total) by mouth every 6 (six) hours as needed for muscle spasms.   methotrexate 2.5 MG tablet Commonly known as: RHEUMATREX Take 10 mg by mouth every Monday. Caution:Chemotherapy. Protect from light.   multivitamin with minerals Tabs tablet Take 1 tablet by mouth daily.   nystatin powder Commonly known as: MYCOSTATIN/NYSTOP Apply 1 application topically 3 (three) times daily.   oxyCODONE 5 MG immediate release tablet Commonly known as: Oxy IR/ROXICODONE Take 1-2 tablets (5-10 mg total) by mouth every 4 (four) hours as  needed for severe pain. What changed:  when to take this reasons to take this   RABEprazole 20 MG tablet Commonly known as: ACIPHEX Take 20 mg by mouth daily before breakfast.   Rybelsus 7 MG Tabs Generic drug: Semaglutide Take 7 mg by mouth every morning.   traMADol 50 MG tablet Commonly known as: ULTRAM Take 1-2 tablets (50-100 mg total) by mouth every 6 (six) hours as needed for moderate pain.   triamterene-hydrochlorothiazide 37.5-25 MG tablet  Commonly known as: MAXZIDE-25 Take 1 tablet by mouth daily.               Discharge Care Instructions  (From admission, onward)           Start     Ordered   04/08/21 0000  Weight bearing as tolerated        04/08/21 1008   04/08/21 0000  Change dressing       Comments: You may remove the bulky bandage (ACE wrap and gauze) two days after surgery. You will have an adhesive waterproof bandage underneath. Leave this in place until your first follow-up appointment.   04/08/21 1008   04/07/21 0000  Change dressing on IV access line weekly and PRN  (Home infusion instructions - [facility] )        04/07/21 1533            Follow-up Information     Gaynelle Arabian, MD. Schedule an appointment as soon as possible for a visit on 04/11/2021.   Specialty: Orthopedic Surgery Contact information: [address] [place] [address] [phone]         [facility] Follow up.   Why: This is also called [facility]. If you have any problems or concerns please call [phone]. Contact information: [address] [place], [place] [address] [phone]                Signed: Theresa Duty, PA-C Orthopedic Surgery 04/10/2021, 8:16 AM

## 2021-04-11 LAB — AEROBIC/ANAEROBIC CULTURE W GRAM STAIN (SURGICAL/DEEP WOUND)

## 2021-04-26 ENCOUNTER — Telehealth: Payer: Self-pay | Admitting: Pharmacist

## 2021-04-26 ENCOUNTER — Ambulatory Visit (INDEPENDENT_AMBULATORY_CARE_PROVIDER_SITE_OTHER): Payer: 59 | Admitting: Infectious Diseases

## 2021-04-26 ENCOUNTER — Other Ambulatory Visit: Payer: Self-pay

## 2021-04-26 DIAGNOSIS — Z5181 Encounter for therapeutic drug level monitoring: Secondary | ICD-10-CM | POA: Diagnosis not present

## 2021-04-26 DIAGNOSIS — T8450XA Infection and inflammatory reaction due to unspecified internal joint prosthesis, initial encounter: Secondary | ICD-10-CM | POA: Insufficient documentation

## 2021-04-26 DIAGNOSIS — T8450XD Infection and inflammatory reaction due to unspecified internal joint prosthesis, subsequent encounter: Secondary | ICD-10-CM | POA: Diagnosis not present

## 2021-04-26 MED ORDER — AMPICILLIN IV (FOR PTA / DISCHARGE USE ONLY): 12.0000 g | INTRAVENOUS | 0 refills | Status: DC

## 2021-04-26 NOTE — Telephone Encounter (Signed)
Called and spoke to [facility] [place] at [facility] and gave verbal order per Dr. West Bali to stop daptomycin and ceftriaxone and transition patient to Ampicillin IV continuous infusion 12gm over 24 hours. Discontinued weekly CPK labs. Advised to continue with weekly CBC, BMET, CRP, and ESR. Pam repeated orders and verbalized understanding.

## 2021-04-26 NOTE — Progress Notes (Signed)
[facility]                                                             [address], [place], [place], [address]                                                                  Phn. 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 As listed below. Most recently had I&D of the right knee by Dr. Lequita Halt on 6/22.  OR cultures growing Pastuerella  dagmatis   Rheumatoid arthritis- on methotrexate   Plan Will change daptomycin and ceftriaxone to Continuous Ampicillin infusion 12 g IV daily to cover Pastuerella as well as E faecalis isolated in prior cultures  He will follow up in 3 weeks with one of my partners around the end date of 6 weeks 05/17/21. I have discussed that with him.  Plan to switch to PO Amoxicillin at that time if doing well.  Will request HH labs, unavailable to me currently  Fu with Ortho as instructed   All questions and concerns were discussed and addressed. Patient verbalized understanding of the plan. ____________________________________________________________________________________________________________________ HPI [name] is a 60 y.o. male y.o. male with a PMH of RA on Methotrexate 20mg  PO weekly , Bilateral Hip replacements and Bilateral knee replacements, RT TKA done in the 1990s with revision in 2012 and serratia PJI in 2015- treated with ceftriaxone x 6 weeks after resection arthroplasty.  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 10-26-1972 after developing a vancomycin allergy. He completed 6 weeks and stopped after that.  With allergy history  no oral continuation therapy was done. He  came back in December 2017 with knee pain, swelling c/w PJI. No OR cx growth and treated for 6 more weeks with daptomycin after I and D with polyexchange. Dr. January 2018 followed him closely and placed him on oral augmentin in February of 2018 and completed approx. 5 months of therapy( feb/2018-04/2017)   This was followed by further I and D with polyexchange in November of 2018 for recurrent rt knee effusion( OR Cx No growth) and feb 2021 for recurrent pain and swelling ( OR cx no growth)   He had an aspirate of his knee that grew Citrobacter Koseri that was fairly S. Then another I and D with polyexchange in May of 2021 ( OR cx No  growth). He has been on protracted oral ciprofloxacin but was having loosening of his prosthesis and there was concern for progressive infection. He underwent I and D with resection arthroplasty with antibiotic spacer placement on 06/2020 with OR cultures growing Amp S E faecalis, completed 6 weeks of IV Zosyn on 10/22 after which patient was started on Augmentin 875/125mg  PO BID until 2/2 when it was stopped and finally had  re-implantation of rt TKA in 12/07/20. OR tissue cultures No growth, no organisms in gram stain.   Patient was doing well until few days prior to admit when he was admitted directly by Ortho for concern of prosthetic joint infection of the right knee.  Patient had increasing discomfort and had developed drainage from the anterior aspect of the right knee for the last few days.  Denies having any fevers or chills but had sweats.  He underwent I&D of the right knee by Dr. Lequita Halt on 6/22.  OR note reviewed in detail-" tremendous amount of seropurulent fluid in the subcutaneous space which was sent for gram stain culture and sensitivity; there was a communication with the joint superiorly and fluid coming from the joint also, polyethylene implant was not removed.     HPI/Iinterval Events  Patient here for recurrent Rt knee  PJI after recent hospital admission where  underwent I&D of the right knee by Dr. Lequita Halt on 6/22.  OR note reviewed in detail-" tremendous amount of seropurulent fluid in the subcutaneous space which was sent for gram stain culture and sensitivity; there was a communication with the joint superiorly and fluid coming from the joint also, polyethylene implant was not removed". OR cultures grew few Pastuerella spp/pastuerella dagmatis. He was discharged on Daptomycin/ceftriaxone from the hospital pending cultures on 04/08/21  Very short encounter time today as patient/wife very frustrated about prolonged waiting time before rooming in. He says he has an appointment to make to Dr Roswell Miners at 3:30 pm. His appointment with [place] was at 2:45 pm. He tells me that he was told that he can come earlier than his scheduled time so that he can be seen earlier and can be out from [facility] by 3 pm so that he can make to Dr Alusio's office at 3:30pm ( not sure who told). He is getting IV antibiotics as prescribed without any issues. Np issues with the PICC line except some irritation. Denies any nausea, vomiting, abdominal pain and diarrhea. Denies any fevers, chills and sweats. His rt leg is bandaged and says he is supposed to see Dr Berton Lan for removal of sutures today.   ROS: 12 point ROS done with all pertinent positives and negatives listed above  Past Medical History:  Diagnosis Date   Allergy    OCC    Anemia    hx of   Asthma    Cataract    small- forming    Cellulitis of lower extremity    "usually RLL; this time it's got up to my lower abdoment" (02/11/2014)-series of antibiotics completed 02-28-14   Diverticulosis    Edema    legs   GERD (gastroesophageal reflux disease)    Hypertension    Infection of right knee (HCC)    OSA (obstructive sleep apnea)    does not use cpap    Pneumonia    20+ years ago   PONV (postoperative nausea and vomiting)    Rheumatoid arthritis (HCC) dx'd ~ 1977   S/P PICC  central line placement 03-16-14   right upper arm remains  intact.05-11-14 removed 1 week ago.   Sleep apnea    no cpap    Past Surgical History:  Procedure Laterality Date   COLONOSCOPY  2012   EXCISIONAL HEMORRHOIDECTOMY     EXCISIONAL TOTAL KNEE ARTHROPLASTY WITH ANTIBIOTIC SPACERS Right 03/18/2014   Procedure: RIGHT KNEE RESECTION ARTHROPLASTY WITH ANTIBIOTIC SPACERS;  Surgeon: Loanne Drilling, MD;  Location: [facility];  Service: Orthopedics;  Laterality: Right;   EXCISIONAL TOTAL KNEE ARTHROPLASTY WITH ANTIBIOTIC SPACERS Right 06/22/2020   Procedure: Right knee resection arthroplasty, antibiotic spacer;  Surgeon: Ollen Gross, MD;  Location: [facility];  Service: Orthopedics;  Laterality: Right;    FOREIGN BODY REMOVAL Left    "BB" removal above left eye-teen yrs.   HERNIA REPAIR     I & D KNEE WITH POLY EXCHANGE Right 04/25/2016   Procedure: RIGHT KNEE IRRIGATION AND DEBRIDEMENT WITH POLY EXCHANGE;  Surgeon: Ollen Gross, MD;  Location: [facility];  Service: Orthopedics;  Laterality: Right;   I & D KNEE WITH POLY EXCHANGE Right 10/01/2016   Procedure: IRRIGATION AND DEBRIDEMENT KNEE WITH POLY EXCHANGE;  Surgeon: Ollen Gross, MD;  Location: [facility];  Service: Orthopedics;  Laterality: Right;   I & D KNEE WITH POLY EXCHANGE Right 08/26/2017   Procedure: IRRIGATION AND DEBRIDEMENT KNEE WITH POLY EXCHANGE;  Surgeon: Ollen Gross, MD;  Location: [facility];  Service: Orthopedics;  Laterality: Right;   I & D KNEE WITH POLY EXCHANGE Right 11/16/2019   Procedure: IRRIGATION AND DEBRIDEMENT KNEE WITH POLY EXCHANGE;  Surgeon: Ollen Gross, MD;  Location: [facility];  Service: Orthopedics;  Laterality: Right;    I & D KNEE WITH POLY EXCHANGE Right 02/22/2020   Procedure: Right knee irrigation and debridement;  Surgeon: Ollen Gross, MD;  Location: [facility];  Service: Orthopedics;  Laterality: Right;    INCISION AND DRAINAGE Right 09/28/2014   Procedure: INCISION AND DRAINAGE RIGHT KNEE;  Surgeon:  Loanne Drilling, MD;  Location: [facility];  Service: Orthopedics;  Laterality: Right;   IR FLUORO GUIDE CV LINE RIGHT  04/07/2021   IR US GUIDE VASC ACCESS RIGHT  04/07/2021   IRRIGATION AND DEBRIDEMENT KNEE Right 04/05/2021   Procedure: IRRIGATION AND DEBRIDEMENT KNEE;  Surgeon: Ollen Gross, MD;  Location: [facility];  Service: Orthopedics;  Laterality: Right;   KNEE BURSECTOMY Right 03/13/2017   Procedure: RIGHT KNEE PREPATELLAR BURSECTOMY;  Surgeon: Ollen Gross, MD;  Location: [facility];  Service: Orthopedics;  Laterality: Right;   picc line placement Right    right upper arm   REIMPLANTATION OF TOTAL KNEE Right 12/07/2020   Procedure: REIMPLANTATION OF RIGHT TOTAL KNEE;  Surgeon: Ollen Gross, MD;  Location: [facility];  Service: Orthopedics;  Laterality: Right;  2 hrs   REPLACEMENT TOTAL HIP W/  RESURFACING IMPLANTS Bilateral 01/2001; 08/2010   "left; right"   REPLACEMENT TOTAL KNEE BILATERAL Bilateral 10/1991; 10/2006   "right; left"   REVISION TOTAL KNEE ARTHROPLASTY Right 2012   SPIGELIAN HERNIA Right 12/04/2015   Procedure: INCARCERATED SPIGELIAN HERNIA REPAIR WITH MESH;  Surgeon: Violeta Gelinas, MD;  Location: [facility];  Service: General;  Laterality: Right;   TOTAL KNEE ARTHROPLASTY Right 05/21/2014   Procedure: RIGHT KNEE ARTHROPLASTY REINPLANTATION;  Surgeon: Loanne Drilling, MD;  Location: [facility];  Service: Orthopedics;  Laterality: Right;   Current Outpatient Medications on File Prior to Visit  Medication Sig Dispense Refill   aspirin 325 MG EC tablet Take 1 tablet (325 mg total) by mouth 2 (two) times daily  for 19 days. Then take one 81 mg aspirin once a day for three weeks. Then discontinue aspirin. 38 tablet 0   cholecalciferol (VITAMIN D3) 25 MCG (1000 UNIT) tablet Take 1,000 Units by mouth daily.     diltiazem (CARDIZEM CD) 180 MG 24 hr capsule Take 1 capsule (180 mg total) by mouth daily. 30 capsule 2   Fluticasone-Salmeterol (ADVAIR) 250-50 MCG/DOSE AEPB Inhale 1 puff into the lungs  daily.     Ipratropium-Albuterol (COMBIVENT RESPIMAT) 20-100 MCG/ACT AERS respimat Inhale 1 puff into the lungs every 6 (six) hours as needed for wheezing or shortness of breath.     losartan (COZAAR) 100 MG tablet Take 100 mg by mouth daily.      methocarbamol (ROBAXIN) 500 MG tablet Take 1 tablet (500 mg total) by mouth every 6 (six) hours as needed for muscle spasms. 40 tablet 0   methotrexate (RHEUMATREX) 2.5 MG tablet Take 10 mg by mouth every Monday. Caution:Chemotherapy. Protect from light.     Multiple Vitamin (MULTIVITAMIN WITH MINERALS) TABS tablet Take 1 tablet by mouth daily.     nystatin (MYCOSTATIN/NYSTOP) powder Apply 1 application topically 3 (three) times daily. 30 g 5   oxyCODONE (OXY IR/ROXICODONE) 5 MG immediate release tablet Take 1-2 tablets (5-10 mg total) by mouth every 4 (four) hours as needed for severe pain. 30 tablet 0   RABEprazole (ACIPHEX) 20 MG tablet Take 20 mg by mouth daily before breakfast.      RYBELSUS 7 MG TABS Take 7 mg by mouth every morning.     traMADol (ULTRAM) 50 MG tablet Take 1-2 tablets (50-100 mg total) by mouth every 6 (six) hours as needed for moderate pain. 40 tablet 0   triamterene-hydrochlorothiazide (MAXZIDE-25) 37.5-25 MG tablet Take 1 tablet by mouth daily.     No current facility-administered medications on file prior to visit.   Allergies  Allergen Reactions   Avelox [Moxifloxacin Hcl In Nacl] Hives, Shortness Of Breath and Itching    Tolerates Cipro   Chlorhexidine     Skin red and burning after skin wash   Doxycycline Itching   Sulfa Antibiotics Itching   Vancomycin Hives and Itching   E.E.S. [Erythromycin] Rash   Erythrocin Rash   Penicillins Rash    Tolerated augmentin   Tolerated Cephalosporin Date: 04/06/21.     Sulfasalazine Rash   Social History   Socioeconomic History   Marital status: Married    Spouse name: Not on file   Number of children: 1   Years of education: 14   Highest education level: Not on file   Occupational History   Not on file  Tobacco Use   Smoking status: Former    Packs/day: 1.00    Years: 3.00    Pack years: 3.00    Types: Cigarettes    Quit date: 03/16/1981    Years since quitting: 40.1   Smokeless tobacco: Current    Types: Snuff   Tobacco comments:    "quit smoking cigarettes in the 1980's"  Vaping Use   Vaping Use: Never used  Substance and Sexual Activity   Alcohol use: No   Drug use: No   Sexual activity: Yes    Partners: Male  Other Topics Concern   Not on file  Social History Narrative   Not on file   Social Determinants of Health   Financial Resource Strain: Not on file  Food Insecurity: Not on file  Transportation Needs: Not on file  Physical Activity: Not on  file  Stress: Not on file  Social Connections: Not on file  Intimate Partner Violence: Not on file    Vitals There were no vitals taken for this visit.   Examination  General - not in acute distress, comfortably sitting in chair, OBESE  HEENT - no pallor and no icterus, NO THRUSH  Chest - b/l clear air entry, no additional sounds CVS- Normal s1s2, RRR Abdomen - Soft, Non tender , non distended Ext- Rt Lower extremity is bandaged ( he is going to see Dr Despina Hick to remove staples today ) Neuro: grossly normal Back - WNL Psych : calm and cooperative   Recent labs CBC Latest Ref Rng & Units 04/08/2021 04/07/2021 04/06/2021  WBC 4.0 - 10.5 K/uL 2.6(L) 3.5(L) 5.5  Hemoglobin 13.0 - 17.0 g/dL 10.1(L) 10.4(L) 10.1(L)  Hematocrit 39.0 - 52.0 % 32.8(L) 34.4(L) 32.4(L)  Platelets 150 - 400 K/uL 210 273 282   CMP Latest Ref Rng & Units 04/08/2021 04/07/2021 04/06/2021  Glucose 70 - 99 mg/dL 037(V) 436(G) 677(C)  BUN 6 - 20 mg/dL 34(K) 35(C) 48(L)  Creatinine 0.61 - 1.24 mg/dL 8.59(M) 9.31(P) 2.16(K)  Sodium 135 - 145 mmol/L 140 141 137  Potassium 3.5 - 5.1 mmol/L 3.7 4.6 4.0  Chloride 98 - 111 mmol/L 109 111 108  CO2 22 - 32 mmol/L 24 22 23   Calcium 8.9 - 10.3 mg/dL 8.1(L) 8.2(L) 7.9(L)   Total Protein 6.5 - 8.1 g/dL - - -  Total Bilirubin 0.3 - 1.2 mg/dL - - -  Alkaline Phos 38 - 126 U/L - - -  AST 15 - 41 U/L - - -  ALT 0 - 44 U/L - - -     Pertinent Microbiology Results for orders placed or performed during the hospital encounter of 04/05/21  SARS Coronavirus 2 by RT PCR (hospital order, performed in [facility] hospital lab) Nasopharyngeal Nasopharyngeal Swab     Status: Abnormal   Collection Time: 04/05/21  1:39 PM   Specimen: Nasopharyngeal Swab  Result Value Ref Range Status   SARS Coronavirus 2 POSITIVE (A) NEGATIVE Final    Comment: RESULT CALLED TO, READ BACK BY AND VERIFIED WITH: COLE,K. @1503  ON 06.22.2022 BY COHEN,K (NOTE) SARS-CoV-2 target nucleic acids are DETECTED  SARS-CoV-2 RNA is generally detectable in upper respiratory specimens  during the acute phase of infection.  Positive results are indicative  of the presence of the identified virus, but do not rule out bacterial infection or co-infection with other pathogens not detected by the test.  Clinical correlation with patient history and  other diagnostic information is necessary to determine patient infection status.  The expected result is negative.  Fact Sheet for Patients:   BoilerBrush.com.cy   Fact Sheet for Healthcare Providers:   https://pope.com/    This test is not yet approved or cleared by the [place] FDA and  has been authorized for detection and/or diagnosis of SARS-CoV-2 by FDA under an Emergency Use Authorization (EUA).  This EUA will remain in effect (meaning thi s test can be used) for the duration of  the COVID-19 declaration under Section 564(b)(1) of the Act, 21 U.S.C. section 360-bbb-3(b)(1), unless the authorization is terminated or revoked sooner.  Performed at [facility], [address] [place], [place] [address]   Aerobic/Anaerobic Culture w Gram Stain (surgical/deep wound)     Status:  None   Collection Time: 04/05/21  4:12 PM   Specimen: PATH Other; Tissue  Result Value Ref Range Status  Specimen Description   Final    SYNOVIAL RIGHT KNEE Performed at [facility], [address], [place], [place] [address]    Special Requests   Final    NONE Performed at [facility], [address], [place], [place] [address]    Gram Stain   Final    RARE WBC PRESENT, PREDOMINANTLY PMN NO ORGANISMS SEEN    Culture   Final    FEW PASTEURELLA SPECIES PASTERURELLA DAGMATIS Usually susceptible to penicillin and other beta lactam agents,quinolones,macrolides and tetracyclines. NO ANAEROBES ISOLATED Performed at [facility], [address] [place], [place] [address]    Report Status 04/11/2021 FINAL  Final  Acid Fast Smear (AFB)     Status: None   Collection Time: 04/05/21  4:12 PM   Specimen: PATH Other; Tissue  Result Value Ref Range Status   AFB Specimen Processing Concentration  Final   Acid Fast Smear Negative  Final    Comment: (NOTE) Performed At: [facility] [address] [place], [place] [id number] Jolene Schimke MD OZ:[id number]    Source (AFB) SYNOVIAL  Final    Comment: RIGHT KNEE Performed at [facility], [address] [place], [place] [address]   SARS CORONAVIRUS 2 (TAT 6-24 HRS) Nasopharyngeal Nasopharyngeal Swab     Status: Abnormal   Collection Time: 04/06/21 12:57 PM   Specimen: Nasopharyngeal Swab  Result Value Ref Range Status   SARS Coronavirus 2 POSITIVE (A) NEGATIVE Final    Comment: (NOTE) SARS-CoV-2 target nucleic acids are DETECTED.  The SARS-CoV-2 RNA is generally detectable in upper and lower respiratory specimens during the acute phase of infection. Positive results are indicative of the presence of SARS-CoV-2 RNA. Clinical correlation with patient history and other diagnostic information is  necessary to determine patient infection status. Positive results  do not rule out bacterial infection or co-infection with other viruses.  The expected result is Negative.  Fact Sheet for Patients: HairSlick.no  Fact Sheet for Healthcare Providers: quierodirigir.com  This test is not yet approved or cleared by the [place] FDA and  has been authorized for detection and/or diagnosis of SARS-CoV-2 by FDA under an Emergency Use Authorization (EUA). This EUA will remain  in effect (meaning this test can be used) for the duration of the COVID-19 declaration under Section 564(b)(1) of the Act, 21 U. S.C. section 360bbb-3(b)(1), unless the authorization is terminated or revoked sooner.   Performed at [facility], [address], [place], [place] [address]     Pertinent Imaging All pertinent labs/Imagings/notes reviewed. All pertinent plain films and CT images have been personally visualized and interpreted; radiology reports have been reviewed. Decision making incorporated into the Impression / Recommendations.  I have spent a total of 60 minutes of face-to-face and non-face-to-face time, excluding clinical staff time, preparing to see patient, ordering tests and/or medications, and provide counseling the patient    Electronically signed by:  Odette Fraction, MD Infectious Disease Physician [facility] [address] [place], [place] [address] Phone: [phone]  Fax: [phone]

## 2021-04-27 ENCOUNTER — Telehealth: Payer: Self-pay

## 2021-04-27 NOTE — Telephone Encounter (Signed)
Contacted [facility] for previous lab work. Labs in provider box.   Hannan Hutmacher Loyola Mast, RN

## 2021-04-27 NOTE — Telephone Encounter (Signed)
-----   Message from Odette Fraction, MD sent at 04/26/2021  5:43 PM EDT ----- Regarding: HH labs Hi Team,   Would you please help me get Home health labs of this patient for me ?  Thanks

## 2021-04-28 ENCOUNTER — Inpatient Hospital Stay: Payer: 59 | Admitting: Infectious Diseases

## 2021-05-03 ENCOUNTER — Telehealth: Payer: Self-pay | Admitting: Student

## 2021-05-03 NOTE — Telephone Encounter (Signed)
Informed that Mr. Helmstetter called IR and states that the stitches from his PICC ine came off today.   Patient underwent tunneled central catheter placement with Dr. Fredia Sorrow on 04/07/21.  Spoke with his wife, Mrs. Fenix Rorke over the phone.  She states that the stiches came off this morning and she secured the catheter with some Tegaderm, but she is concerned that she did not use sterile technique while she placed the Tegarderm.  States that there is some "oozing" from where the sutures were placed, it appears thin and non odorous. Patient is reporting some itchiness.  She states that PICC line seems working ok as it was able to be flushed today, and a home health nurse will be coming to her house tomorrow for routine catheter care.   Suggested waiting on revaluation from the home health nurse as the catheter seems to be secured and intact, and recommended contacting IR if the home health nurse states that the catheter needs to be evaluated by a provider.  She verbalized understanding and agreeable with the plan.   Please call IR for further questions and concerns regarding tunneled central catheter.    Lynann Bologna Lillion Elbert PA-C 05/03/2021 4:57 PM

## 2021-05-17 ENCOUNTER — Encounter: Payer: Self-pay | Admitting: Infectious Disease

## 2021-05-17 ENCOUNTER — Ambulatory Visit (INDEPENDENT_AMBULATORY_CARE_PROVIDER_SITE_OTHER): Payer: 59 | Admitting: Infectious Disease

## 2021-05-17 ENCOUNTER — Ambulatory Visit (HOSPITAL_COMMUNITY)
Admission: RE | Admit: 2021-05-17 | Discharge: 2021-05-17 | Disposition: A | Discharge status: Home / Self Care | Payer: 59 | Source: Ambulatory Visit | Attending: Infectious Disease | Admitting: Infectious Disease

## 2021-05-17 ENCOUNTER — Other Ambulatory Visit: Payer: Self-pay

## 2021-05-17 VITALS — BP 150/84 | HR 78 | Temp 97.4°F

## 2021-05-17 DIAGNOSIS — N189 Chronic kidney disease, unspecified: Secondary | ICD-10-CM

## 2021-05-17 DIAGNOSIS — T8453XD Infection and inflammatory reaction due to internal right knee prosthesis, subsequent encounter: Secondary | ICD-10-CM

## 2021-05-17 DIAGNOSIS — I129 Hypertensive chronic kidney disease with stage 1 through stage 4 chronic kidney disease, or unspecified chronic kidney disease: Secondary | ICD-10-CM

## 2021-05-17 DIAGNOSIS — M069 Rheumatoid arthritis, unspecified: Secondary | ICD-10-CM

## 2021-05-17 DIAGNOSIS — I872 Venous insufficiency (chronic) (peripheral): Secondary | ICD-10-CM

## 2021-05-17 DIAGNOSIS — I159 Secondary hypertension, unspecified: Secondary | ICD-10-CM

## 2021-05-17 DIAGNOSIS — X58XXXD Exposure to other specified factors, subsequent encounter: Secondary | ICD-10-CM | POA: Diagnosis not present

## 2021-05-17 DIAGNOSIS — M009 Pyogenic arthritis, unspecified: Secondary | ICD-10-CM

## 2021-05-17 DIAGNOSIS — A280 Pasteurellosis: Secondary | ICD-10-CM | POA: Diagnosis not present

## 2021-05-17 DIAGNOSIS — Z959 Presence of cardiac and vascular implant and graft, unspecified: Secondary | ICD-10-CM

## 2021-05-17 DIAGNOSIS — T8450XD Infection and inflammatory reaction due to unspecified internal joint prosthesis, subsequent encounter: Secondary | ICD-10-CM

## 2021-05-17 HISTORY — DX: Pasteurellosis: A28.0

## 2021-05-17 HISTORY — PX: IR REMOVAL TUN CV CATH W/O FL: IMG2289

## 2021-05-17 MED ORDER — AMOXICILLIN 500 MG PO CAPS: 500.0000 mg | ORAL_CAPSULE | Freq: Three times a day (TID) | ORAL | 11 refills | Status: DC

## 2021-05-17 NOTE — Procedures (Signed)
Per order of Dr. Daiva Eves, pt's right IJ tunneled central venous catheter was removed in its entirety without immediate complications.  Gauze dressing applied over site. EBL< 1 cc.

## 2021-05-17 NOTE — Progress Notes (Signed)
Subjective:  Chief complaint: Has had some drainage from his knee  Patient ID: [name], male    DOB: 13-Sep-1961, 60 y.o.   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  He then had prostatic joint infection with Staphylococcus lugdunensis and (methicillin sensitive in 2017 which was treated with daptomycin along with rifampin after I&D and polyexchange.  He apparently developed a vancomycin allergy at that time.  He completed 6 weeks of therapy and then stopped.  He then returned in 2017 with knee pain and swelling and now was found again to have prosthetic joint infection was taken to the operating room but nothing grew from culture and he was treated for 6 weeks with daptomycin after I&D and polyexchange he is followed by Dr. Luciana Axe and placed on Augmentin in February 2018 and completed at least 5 months of oral therapy.  He then underwent additional I&D with pollex change in November 2018 recurrent right knee infection again with negative cultures.  Then had aspirate the knee that grew Citrobacter koseri that was fairly sensitive and underwent another I&D with polyexchange in May 2021.  He was treated with protracted oral ciprofloxacin but was having loosening of his prosthesis and was concern for compressive infection and underwent I&D with resection arthroplasty now with an antibiotic spacer placed in September 2021.  OR cultures now yielded ampicillin sensitive Enterococcus faecalis.  He completed 6 weeks of IV Zosyn on October 22.  After that he was placed on Augmentin 875 125 twice daily until February 2.  It was then stopped and he ultimately underwent reimplantation of right total knee arthroplasty on December 07, 2020.  Cultures in the OR at the time of surgery were without growth.  He was doing well until few days prior to admission this June he was admitted by orthopedics due to concerns for recurrence of prosthetic joint infection.  He was having increased discomfort drainage from an anterior aspect of the knee.  He underwent I&D by Dr. Despina Hick on June 22.  Ultrasound the OR grew Pasteurella species.  Patient had been in the interim discharged on daptomycin and ceftriaxone prior to cultures resulting.  He was seen and followed by Dr.Manandhar and changed to continuous dose ampicillin with plans to finish that course today followed by oral amoxicillin.  In asking him closely he does have a boxer that likes to lick his feet and also likes to jump into his lap frequently and sometimes scratches his skin.  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  I went to great lengths to explain that Pasteurella is a normal part of the oral flora of dogs and that the dog is not unhealthy and is in no way doing anything wrong with his behaviors though I did caution him to try to protect his skin and in particular his wounds from being scratched by his dog.  He says his knee pain has improved dramatically though 2 days ago he slipped and fell causing his knee to be bent out of normal shape he has since then had some bloody drainage from an open area on the top of his incision.  He has seen Dr.  Alusio for followup.   Past Medical History:  Diagnosis Date   Allergy    OCC    Anemia    hx of   Asthma    Cataract    small- forming    Cellulitis of lower extremity    "usually RLL; this time it's got up to my lower abdoment" (02/11/2014)-series of antibiotics completed 02-28-14   Diverticulosis    Edema    legs   GERD (gastroesophageal reflux disease)    Hypertension    Infection of right knee (HCC)    OSA (obstructive  sleep apnea)    does not use cpap    Pneumonia    20+ years ago   PONV (postoperative nausea and vomiting)    Rheumatoid arthritis (HCC) dx'd ~ 1977   S/P PICC central line placement 03-16-14   right upper arm remains intact.05-11-14 removed 1 week ago.   Sleep apnea    no cpap     Past Surgical History:  Procedure Laterality Date   COLONOSCOPY  2012   EXCISIONAL HEMORRHOIDECTOMY     EXCISIONAL TOTAL KNEE ARTHROPLASTY WITH ANTIBIOTIC SPACERS Right 03/18/2014   Procedure: RIGHT KNEE RESECTION ARTHROPLASTY WITH ANTIBIOTIC SPACERS;  Surgeon: Loanne Drilling, MD;  Location: [facility];  Service: Orthopedics;  Laterality: Right;   EXCISIONAL TOTAL KNEE ARTHROPLASTY WITH ANTIBIOTIC SPACERS Right 06/22/2020   Procedure: Right knee resection arthroplasty, antibiotic spacer;  Surgeon: Ollen Gross, MD;  Location: [facility];  Service: Orthopedics;  Laterality: Right;    FOREIGN BODY REMOVAL Left    "BB" removal above left eye-teen yrs.   HERNIA REPAIR     I & D KNEE WITH POLY EXCHANGE Right 04/25/2016   Procedure: RIGHT KNEE IRRIGATION AND DEBRIDEMENT WITH POLY EXCHANGE;  Surgeon: Ollen Gross, MD;  Location: [facility];  Service: Orthopedics;  Laterality: Right;   I & D KNEE WITH POLY EXCHANGE Right 10/01/2016   Procedure: IRRIGATION AND DEBRIDEMENT KNEE WITH POLY EXCHANGE;  Surgeon: Ollen Gross, MD;  Location: [facility];  Service: Orthopedics;  Laterality: Right;   I & D KNEE WITH POLY EXCHANGE Right 08/26/2017   Procedure: IRRIGATION AND DEBRIDEMENT KNEE WITH POLY EXCHANGE;  Surgeon: Ollen Gross, MD;  Location: [facility];  Service: Orthopedics;  Laterality: Right;   I & D KNEE WITH POLY EXCHANGE Right 11/16/2019   Procedure: IRRIGATION AND DEBRIDEMENT KNEE WITH POLY EXCHANGE;  Surgeon: Ollen Gross, MD;  Location: [facility];  Service: Orthopedics;  Laterality: Right;    I & D KNEE WITH POLY EXCHANGE Right 02/22/2020   Procedure: Right knee irrigation and debridement;  Surgeon: Ollen Gross, MD;   Location: [facility];  Service: Orthopedics;  Laterality: Right;    INCISION AND DRAINAGE Right 09/28/2014   Procedure: INCISION AND DRAINAGE RIGHT KNEE;  Surgeon: Loanne Drilling, MD;  Location: [facility];  Service: Orthopedics;  Laterality: Right;   IR FLUORO GUIDE CV LINE RIGHT  04/07/2021   IR US GUIDE VASC ACCESS RIGHT  04/07/2021   IRRIGATION AND DEBRIDEMENT KNEE Right 04/05/2021   Procedure: IRRIGATION AND DEBRIDEMENT KNEE;  Surgeon: Ollen Gross, MD;  Location: [facility];  Service: Orthopedics;  Laterality: Right;   KNEE BURSECTOMY Right 03/13/2017   Procedure: RIGHT KNEE PREPATELLAR BURSECTOMY;  Surgeon: Ollen Gross, MD;  Location: [facility];  Service: Orthopedics;  Laterality: Right;   picc line placement Right    right upper arm   REIMPLANTATION OF TOTAL KNEE Right 12/07/2020   Procedure: REIMPLANTATION OF RIGHT TOTAL  KNEE;  Surgeon: Ollen Gross, MD;  Location: [facility];  Service: Orthopedics;  Laterality: Right;  2 hrs   REPLACEMENT TOTAL HIP W/  RESURFACING IMPLANTS Bilateral 01/2001; 08/2010   "left; right"   REPLACEMENT TOTAL KNEE BILATERAL Bilateral 10/1991; 10/2006   "right; left"   REVISION TOTAL KNEE ARTHROPLASTY Right 2012   SPIGELIAN HERNIA Right 12/04/2015   Procedure: INCARCERATED SPIGELIAN HERNIA REPAIR WITH MESH;  Surgeon: Violeta Gelinas, MD;  Location: [facility];  Service: General;  Laterality: Right;   TOTAL KNEE ARTHROPLASTY Right 05/21/2014   Procedure: RIGHT KNEE ARTHROPLASTY REINPLANTATION;  Surgeon: Loanne Drilling, MD;  Location: [facility];  Service: Orthopedics;  Laterality: Right;    Family History  Problem Relation Age of Onset   Colon cancer Mother 85       passed age 27 - stage 4    Hypertension Mother    Diabetes Mellitus II Paternal Grandmother    Colon polyps Neg Hx    Esophageal cancer Neg Hx    Stomach cancer Neg Hx    Rectal cancer Neg Hx       Social History   Socioeconomic History   Marital status: Married    Spouse name: Not on file   Number of  children: 1   Years of education: 14   Highest education level: Not on file  Occupational History   Not on file  Tobacco Use   Smoking status: Former    Packs/day: 1.00    Years: 3.00    Pack years: 3.00    Types: Cigarettes    Quit date: 03/16/1981    Years since quitting: 40.1   Smokeless tobacco: Current    Types: Snuff   Tobacco comments:    "quit smoking cigarettes in the 1980's"  Vaping Use   Vaping Use: Never used  Substance and Sexual Activity   Alcohol use: No   Drug use: No   Sexual activity: Yes    Partners: Male  Other Topics Concern   Not on file  Social History Narrative   Not on file   Social Determinants of Health   Financial Resource Strain: Not on file  Food Insecurity: Not on file  Transportation Needs: Not on file  Physical Activity: Not on file  Stress: Not on file  Social Connections: Not on file    Allergies  Allergen Reactions   Avelox [Moxifloxacin Hcl In Nacl] Hives, Shortness Of Breath and Itching    Tolerates Cipro   Chlorhexidine     Skin red and burning after skin wash   Doxycycline Itching   Sulfa Antibiotics Itching   Vancomycin Hives and Itching   E.E.S. [Erythromycin] Rash   Erythrocin Rash   Penicillins Rash    Tolerated augmentin   Tolerated Cephalosporin Date: 04/06/21.     Sulfasalazine Rash     Current Outpatient Medications:    ampicillin IVPB, Inject 12 g into the vein daily. Continuous infusion, Disp: 12 Units, Rfl: 0   cholecalciferol (VITAMIN D3) 25 MCG (1000 UNIT) tablet, Take 1,000 Units by mouth daily., Disp: , Rfl:    diltiazem (CARDIZEM CD) 180 MG 24 hr capsule, Take 1 capsule (180 mg total) by mouth daily., Disp: 30 capsule, Rfl: 2   Fluticasone-Salmeterol (ADVAIR) 250-50 MCG/DOSE AEPB, Inhale 1 puff into the lungs daily., Disp: , Rfl:    Ipratropium-Albuterol (COMBIVENT RESPIMAT) 20-100 MCG/ACT AERS respimat, Inhale 1 puff into the lungs every 6 (six) hours as needed for wheezing or shortness of breath.,  Disp: , Rfl:    losartan (COZAAR) 100 MG tablet, Take 100 mg by mouth daily. , Disp: , Rfl:    methocarbamol (ROBAXIN) 500 MG tablet, Take 1 tablet (500 mg total) by mouth every 6 (six) hours as needed for muscle spasms., Disp: 40 tablet, Rfl: 0   methotrexate (RHEUMATREX) 2.5 MG tablet, Take 10 mg by mouth every Monday. Caution:Chemotherapy. Protect from light., Disp: , Rfl:    Multiple Vitamin (MULTIVITAMIN WITH MINERALS) TABS tablet, Take 1 tablet by mouth daily., Disp: , Rfl:    nystatin (MYCOSTATIN/NYSTOP) powder, Apply 1 application topically 3 (three) times daily., Disp: 30 g, Rfl: 5   oxyCODONE (OXY IR/ROXICODONE) 5 MG immediate release tablet, Take 1-2 tablets (5-10 mg total) by mouth every 4 (four) hours as needed for severe pain., Disp: 30 tablet, Rfl: 0   RABEprazole (ACIPHEX) 20 MG tablet, Take 20 mg by mouth daily before breakfast. , Disp: , Rfl:    RYBELSUS 7 MG TABS, Take 7 mg by mouth every morning., Disp: , Rfl:    traMADol (ULTRAM) 50 MG tablet, Take 1-2 tablets (50-100 mg total) by mouth every 6 (six) hours as needed for moderate pain., Disp: 40 tablet, Rfl: 0   triamterene-hydrochlorothiazide (MAXZIDE-25) 37.5-25 MG tablet, Take 1 tablet by mouth daily., Disp: , Rfl:    Review of Systems  Constitutional:  Negative for chills and fever.  HENT:  Negative for congestion and sore throat.   Eyes:  Negative for photophobia.  Respiratory:  Negative for cough, shortness of breath and wheezing.   Cardiovascular:  Negative for chest pain, palpitations and leg swelling.  Gastrointestinal:  Negative for abdominal pain, blood in stool, constipation, diarrhea, nausea and vomiting.  Genitourinary:  Negative for dysuria, flank pain and hematuria.  Musculoskeletal:  Positive for arthralgias. Negative for back pain and myalgias.  Skin:  Positive for wound. Negative for rash.  Neurological:  Negative for dizziness, weakness and headaches.  Hematological:  Does not bruise/bleed easily.   Psychiatric/Behavioral:  Negative for agitation, behavioral problems, confusion, dysphoric mood, hallucinations, sleep disturbance and suicidal ideas. The patient is not nervous/anxious and is not hyperactive.       Objective:   Physical Exam Constitutional:      Appearance: He is well-developed.  HENT:     Head: Normocephalic and atraumatic.  Eyes:     General:        Right eye: No discharge.        Left eye: No discharge.     Extraocular Movements: Extraocular movements intact.     Conjunctiva/sclera: Conjunctivae normal.  Cardiovascular:     Rate and Rhythm: Normal rate and regular rhythm.  Pulmonary:     Effort: Pulmonary effort is normal. No respiratory distress.     Breath sounds: No wheezing.  Abdominal:     General: There is no distension.     Palpations: Abdomen is soft.  Musculoskeletal:        General: No tenderness. Normal range of motion.     Cervical back: Normal range of motion and neck supple.  Skin:    General: Skin is warm and dry.     Coloration: Skin is not pale.     Findings: No erythema or rash.  Neurological:     General: No focal deficit present.     Mental Status: He is alert and oriented to person, place, and time.  Psychiatric:        Mood and Affect: Mood normal.  Behavior: Behavior normal.        Thought Content: Thought content normal.        Judgment: Judgment normal.    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  I have written an order for interventional radiology to remove his tunneled catheter.  Rheumatoid thrice he is still on methotrexate.  Renal failure he had a abrupt worsening of his renal function with his recent hospitalization the kidney function seems to be improving at this point in time  HTN: Blood pressure up but he and his  wife tell me that the blood pressure medications have been adjusted in the context of his recent hospitalization with severe infection.   Today's Vitals   05/17/21 0851  BP: (!) 150/84  Pulse: 78  Temp: (!) 97.4 F (36.3 C)  TempSrc: Oral   There is no height or weight on file to calculate BMI.  I spent 36 minutes with the patient including face to face counseling of the patient and his wife regarding the nature of prosthetic joint infections the nature of this specific pathogen that has now Pasteurella, reviewing his microbiological data including most recent culture data yielded Pasteurella as well as past culture data outlined above in HPI, review of medical records from the inpatient world as well as from Dr. Tana Felts office  before and during the visit and in coordination of his care.

## 2021-05-19 LAB — ACID FAST CULTURE WITH REFLEXED SENSITIVITIES (MYCOBACTERIA): Acid Fast Culture: NEGATIVE

## 2021-06-16 ENCOUNTER — Other Ambulatory Visit: Payer: Self-pay

## 2021-06-16 ENCOUNTER — Ambulatory Visit (INDEPENDENT_AMBULATORY_CARE_PROVIDER_SITE_OTHER): Payer: 59 | Admitting: Infectious Disease

## 2021-06-16 VITALS — BP 129/75 | HR 75 | Resp 16 | Ht 68.0 in | Wt 329.0 lb

## 2021-06-16 DIAGNOSIS — A280 Pasteurellosis: Secondary | ICD-10-CM

## 2021-06-16 DIAGNOSIS — T8450XD Infection and inflammatory reaction due to unspecified internal joint prosthesis, subsequent encounter: Secondary | ICD-10-CM

## 2021-06-16 DIAGNOSIS — T8453XD Infection and inflammatory reaction due to internal right knee prosthesis, subsequent encounter: Secondary | ICD-10-CM | POA: Diagnosis not present

## 2021-06-16 DIAGNOSIS — I159 Secondary hypertension, unspecified: Secondary | ICD-10-CM

## 2021-06-16 DIAGNOSIS — I872 Venous insufficiency (chronic) (peripheral): Secondary | ICD-10-CM | POA: Diagnosis not present

## 2021-06-16 DIAGNOSIS — M069 Rheumatoid arthritis, unspecified: Secondary | ICD-10-CM

## 2021-06-16 MED ORDER — AMOXICILLIN 500 MG PO CAPS: 500.0000 mg | ORAL_CAPSULE | Freq: Three times a day (TID) | ORAL | 3 refills | Status: DC

## 2021-06-16 NOTE — Progress Notes (Signed)
Subjective:  Chief complaint: Follow-up for prosthetic joint infection  Patient ID: [name], male    DOB: 30-Sep-1961, 60 y.o.   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  He then had prostatic joint infection with Staphylococcus lugdunensis and (methicillin sensitive in 2017 which was treated with daptomycin along with rifampin after I&D and polyexchange.  He apparently developed a vancomycin allergy at that time.  He completed 6 weeks of therapy and then stopped.  He then returned in 2017 with knee pain and swelling and now was found again to have prosthetic joint infection was taken to the operating room but nothing grew from culture and he was treated for 6 weeks with daptomycin after I&D and polyexchange he is followed by Dr. Luciana [name] and placed on Augmentin in February 2018 and completed at least 5 months of oral therapy.  He then underwent additional I&D with pollex change in November 2018 recurrent right knee infection again with negative cultures.  Then had aspirate the knee that grew Citrobacter koseri that was fairly sensitive and underwent another I&D with polyexchange in May 2021.  He was treated with protracted oral ciprofloxacin but was having loosening of his prosthesis and was concern for compressive infection and underwent I&D with resection arthroplasty now with an antibiotic spacer placed in September 2021.  OR cultures now yielded ampicillin sensitive Enterococcus faecalis.  He completed 6 weeks of IV Zosyn on October 22.  After that he was placed on Augmentin 875 125 twice daily until February 2.  It was then stopped and he ultimately underwent reimplantation of right total knee arthroplasty on December 07, 2020.  Cultures in the OR at the time of surgery were without growth.  He was doing well until few days prior to admission this June 2022 he was admitted by orthopedics due to concerns for recurrence of prosthetic joint infection.  He was having increased discomfort drainage from an anterior aspect of the knee.  He underwent I&D by Dr. Despina [name] on June 22.  Cultures done in the  OR grew Pasteurella species.  Patient had been in the interim discharged on daptomycin and ceftriaxone prior to cultures resulting.  He was seen and followed by Dr.Manandhar and changed to continuous dose ampicillin with plans to finish that course today followed by oral amoxicillin.  In asking him closely at last visit he told me he did  have a boxer that likes to lick his feet and also likes to jump into his lap frequently and sometimes scratches his skin.  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  I went to great lengths to explain that Pasteurella is a normal part of the oral flora of dogs and that the dog is not unhealthy and is in no way doing anything wrong with his behaviors though I did caution him to try to protect his skin and in particular his wounds from being scratched by his dog.  Pain was already much better at last visit.  He does continue to have drainage from the knee.  He said that this was the case with all of his surgeries that he has had superficial drainage for up to  3 months at a time.  He saw Dr. Despina [name] yesterday.   Past Medical History:  Diagnosis Date   Allergy    OCC    Anemia    hx of   Asthma    Cataract    small- forming    Cellulitis of lower extremity    "usually RLL; this time it's got up to my lower abdoment" (02/11/2014)-series of antibiotics completed 02-28-14   Diverticulosis    Edema    legs   GERD (gastroesophageal reflux disease)    Hypertension    Infection of right knee  (HCC)    OSA (obstructive sleep apnea)    does not use cpap    Pasteurella infection 05/17/2021   Pneumonia    20+ years ago   PONV (postoperative nausea and vomiting)    Rheumatoid arthritis (HCC) dx'd ~ 1977   S/P PICC central line placement 03-16-14   right upper arm remains intact.05-11-14 removed 1 week ago.   Sleep apnea    no cpap     Past Surgical History:  Procedure Laterality Date   COLONOSCOPY  2012   EXCISIONAL HEMORRHOIDECTOMY     EXCISIONAL TOTAL KNEE ARTHROPLASTY WITH ANTIBIOTIC SPACERS Right 03/18/2014   Procedure: RIGHT KNEE RESECTION ARTHROPLASTY WITH ANTIBIOTIC SPACERS;  Surgeon: Loanne Drilling, MD;  Location: [facility];  Service: Orthopedics;  Laterality: Right;   EXCISIONAL TOTAL KNEE ARTHROPLASTY WITH ANTIBIOTIC SPACERS Right 06/22/2020   Procedure: Right knee resection arthroplasty, antibiotic spacer;  Surgeon: Ollen Gross, MD;  Location: [facility];  Service: Orthopedics;  Laterality: Right;    FOREIGN BODY REMOVAL Left    "BB" removal above left eye-teen yrs.   HERNIA REPAIR     I & D KNEE WITH POLY EXCHANGE Right 04/25/2016   Procedure: RIGHT KNEE IRRIGATION AND DEBRIDEMENT WITH POLY EXCHANGE;  Surgeon: Ollen Gross, MD;  Location: [facility];  Service: Orthopedics;  Laterality: Right;   I & D KNEE WITH POLY EXCHANGE Right 10/01/2016   Procedure: IRRIGATION AND DEBRIDEMENT KNEE WITH POLY EXCHANGE;  Surgeon: Ollen Gross, MD;  Location: [facility];  Service: Orthopedics;  Laterality: Right;   I & D KNEE WITH POLY EXCHANGE Right 08/26/2017   Procedure: IRRIGATION AND DEBRIDEMENT KNEE WITH POLY EXCHANGE;  Surgeon: Ollen Gross, MD;  Location: [facility];  Service: Orthopedics;  Laterality: Right;   I & D KNEE WITH POLY EXCHANGE Right 11/16/2019   Procedure: IRRIGATION AND DEBRIDEMENT KNEE WITH POLY EXCHANGE;  Surgeon: Ollen Gross, MD;  Location: [facility];  Service: Orthopedics;  Laterality: Right;    I & D KNEE WITH POLY EXCHANGE Right 02/22/2020   Procedure: Right knee  irrigation and debridement;  Surgeon: Ollen Gross, MD;  Location: [facility];  Service: Orthopedics;  Laterality: Right;    INCISION AND DRAINAGE Right 09/28/2014   Procedure: INCISION AND DRAINAGE RIGHT KNEE;  Surgeon: Loanne Drilling, MD;  Location: [facility];  Service: Orthopedics;  Laterality: Right;   IR FLUORO GUIDE CV LINE RIGHT  04/07/2021   IR REMOVAL TUN CV CATH W/O FL  05/17/2021   IR US GUIDE VASC ACCESS RIGHT  04/07/2021   IRRIGATION AND DEBRIDEMENT KNEE Right 04/05/2021   Procedure: IRRIGATION AND DEBRIDEMENT KNEE;  Surgeon: Ollen Gross, MD;  Location: [facility];  Service: Orthopedics;  Laterality: Right;   KNEE BURSECTOMY Right 03/13/2017   Procedure: RIGHT KNEE PREPATELLAR BURSECTOMY;  Surgeon: Ollen Gross, MD;  Location: [facility];  Service: Orthopedics;  Laterality: Right;   picc  line placement Right    right upper arm   REIMPLANTATION OF TOTAL KNEE Right 12/07/2020   Procedure: REIMPLANTATION OF RIGHT TOTAL KNEE;  Surgeon: Ollen Gross, MD;  Location: [facility];  Service: Orthopedics;  Laterality: Right;  2 hrs   REPLACEMENT TOTAL HIP W/  RESURFACING IMPLANTS Bilateral 01/2001; 08/2010   "left; right"   REPLACEMENT TOTAL KNEE BILATERAL Bilateral 10/1991; 10/2006   "right; left"   REVISION TOTAL KNEE ARTHROPLASTY Right 2012   SPIGELIAN HERNIA Right 12/04/2015   Procedure: INCARCERATED SPIGELIAN HERNIA REPAIR WITH MESH;  Surgeon: Violeta Gelinas, MD;  Location: [facility];  Service: General;  Laterality: Right;   TOTAL KNEE ARTHROPLASTY Right 05/21/2014   Procedure: RIGHT KNEE ARTHROPLASTY REINPLANTATION;  Surgeon: Loanne Drilling, MD;  Location: [facility];  Service: Orthopedics;  Laterality: Right;    Family History  Problem Relation Age of Onset   Colon cancer Mother 11       passed age 61 - stage 4    Hypertension Mother    Diabetes Mellitus II Paternal Grandmother    Colon polyps Neg Hx    Esophageal cancer Neg Hx    Stomach cancer Neg Hx    Rectal cancer Neg Hx       Social  History   Socioeconomic History   Marital status: Married    Spouse name: Not on file   Number of children: 1   Years of education: 14   Highest education level: Not on file  Occupational History   Not on file  Tobacco Use   Smoking status: Former    Packs/day: 1.00    Years: 3.00    Pack years: 3.00    Types: Cigarettes    Quit date: 03/16/1981    Years since quitting: 40.2   Smokeless tobacco: Current    Types: Snuff   Tobacco comments:    "quit smoking cigarettes in the 1980's"  Vaping Use   Vaping Use: Never used  Substance and Sexual Activity   Alcohol use: No   Drug use: No   Sexual activity: Yes    Partners: Male  Other Topics Concern   Not on file  Social History Narrative   Not on file   Social Determinants of Health   Financial Resource Strain: Not on file  Food Insecurity: Not on file  Transportation Needs: Not on file  Physical Activity: Not on file  Stress: Not on file  Social Connections: Not on file    Allergies  Allergen Reactions   Avelox [Moxifloxacin Hcl In Nacl] Hives, Shortness Of Breath and Itching    Tolerates Cipro   Chlorhexidine     Skin red and burning after skin wash   Doxycycline Itching   Sulfa Antibiotics Itching   Vancomycin Hives and Itching   E.E.S. [Erythromycin] Rash   Erythrocin Rash   Penicillins Rash    Tolerated augmentin   Tolerated Cephalosporin Date: 04/06/21.     Sulfasalazine Rash     Current Outpatient Medications:    amoxicillin (AMOXIL) 500 MG capsule, Take 1 capsule (500 mg total) by mouth 3 (three) times daily., Disp: 90 capsule, Rfl: 11   cholecalciferol (VITAMIN D3) 25 MCG (1000 UNIT) tablet, Take 1,000 Units by mouth daily., Disp: , Rfl:    diltiazem (CARDIZEM CD) 180 MG 24 hr capsule, Take 1 capsule (180 mg total) by mouth daily., Disp: 30 capsule, Rfl: 2   diltiazem (TIAZAC) 180 MG 24 hr capsule, , Disp: , Rfl:  Fluticasone-Salmeterol (ADVAIR) 250-50 MCG/DOSE AEPB, Inhale 1 puff into the lungs  daily., Disp: , Rfl:    Ipratropium-Albuterol (COMBIVENT RESPIMAT) 20-100 MCG/ACT AERS respimat, Inhale 1 puff into the lungs every 6 (six) hours as needed for wheezing or shortness of breath., Disp: , Rfl:    losartan (COZAAR) 100 MG tablet, Take 100 mg by mouth daily. , Disp: , Rfl:    methocarbamol (ROBAXIN) 500 MG tablet, Take 1 tablet (500 mg total) by mouth every 6 (six) hours as needed for muscle spasms., Disp: 40 tablet, Rfl: 0   methotrexate (RHEUMATREX) 2.5 MG tablet, Take 10 mg by mouth every Monday. Caution:Chemotherapy. Protect from light., Disp: , Rfl:    MOUNJARO 5 MG/0.5ML Pen, SMARTSIG:5 Milligram(s) SUB-Q Once a Week, Disp: , Rfl:    Multiple Vitamin (MULTIVITAMIN WITH MINERALS) TABS tablet, Take 1 tablet by mouth daily., Disp: , Rfl:    nystatin (MYCOSTATIN/NYSTOP) powder, Apply 1 application topically 3 (three) times daily., Disp: 30 g, Rfl: 5   RABEprazole (ACIPHEX) 20 MG tablet, Take 20 mg by mouth daily before breakfast. , Disp: , Rfl:    RYBELSUS 7 MG TABS, Take 7 mg by mouth every morning., Disp: , Rfl:    traMADol (ULTRAM) 50 MG tablet, Take 1-2 tablets (50-100 mg total) by mouth every 6 (six) hours as needed for moderate pain., Disp: 40 tablet, Rfl: 0   triamterene-hydrochlorothiazide (MAXZIDE-25) 37.5-25 MG tablet, Take 1 tablet by mouth daily., Disp: , Rfl:    oxyCODONE (OXY IR/ROXICODONE) 5 MG immediate release tablet, Take 1-2 tablets (5-10 mg total) by mouth every 4 (four) hours as needed for severe pain. (Patient not taking: Reported on 06/16/2021), Disp: 30 tablet, Rfl: 0   Review of Systems  Constitutional:  Negative for activity change, appetite change, chills, diaphoresis, fatigue, fever and unexpected weight change.  HENT:  Negative for congestion, rhinorrhea, sinus pressure, sneezing, sore throat and trouble swallowing.   Eyes:  Negative for photophobia and visual disturbance.  Respiratory:  Negative for cough, chest tightness, shortness of breath, wheezing and  stridor.   Cardiovascular:  Negative for chest pain, palpitations and leg swelling.  Gastrointestinal:  Negative for abdominal distention, abdominal pain, anal bleeding, blood in stool, constipation, diarrhea, nausea and vomiting.  Genitourinary:  Negative for difficulty urinating, dysuria, flank pain and hematuria.  Musculoskeletal:  Negative for arthralgias, back pain, gait problem, joint swelling and myalgias.  Skin:  Positive for wound. Negative for color change, pallor and rash.  Neurological:  Negative for dizziness, tremors, weakness and light-headedness.  Hematological:  Negative for adenopathy. Does not bruise/bleed easily.  Psychiatric/Behavioral:  Negative for agitation, behavioral problems, confusion, decreased concentration, dysphoric mood and sleep disturbance.       Objective:   Physical Exam Constitutional:      Appearance: He is well-developed.  HENT:     Head: Normocephalic and atraumatic.  Eyes:     Conjunctiva/sclera: Conjunctivae normal.  Cardiovascular:     Rate and Rhythm: Normal rate and regular rhythm.  Pulmonary:     Effort: Pulmonary effort is normal. No respiratory distress.     Breath sounds: No wheezing.  Abdominal:     General: There is no distension.     Palpations: Abdomen is soft.  Musculoskeletal:        General: No tenderness. Normal range of motion.     Cervical back: Normal range of motion and neck supple.  Skin:    General: Skin is warm and dry.     Coloration: Skin  is not pale.     Findings: No erythema or rash.  Neurological:     General: No focal deficit present.     Mental Status: He is alert and oriented to person, place, and time.  Psychiatric:        Mood and Affect: Mood normal.        Behavior: Behavior normal.        Thought Content: Thought content normal.        Judgment: Judgment normal.    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

## 2021-06-17 LAB — BASIC METABOLIC PANEL WITH GFR
BUN: 16 mg/dL (ref 7–25)
CO2: 26 mmol/L (ref 20–32)
Calcium: 8.9 mg/dL (ref 8.6–10.3)
Chloride: 106 mmol/L (ref 98–110)
Creat: 0.96 mg/dL (ref 0.70–1.35)
Glucose, Bld: 104 mg/dL — ABNORMAL HIGH (ref 65–99)
Potassium: 3.9 mmol/L (ref 3.5–5.3)
Sodium: 139 mmol/L (ref 135–146)
eGFR: 90 mL/min/{1.73_m2} (ref >=60)

## 2021-06-17 LAB — CBC WITH DIFFERENTIAL/PLATELET
Absolute Monocytes: 448 cells/uL (ref 200–950)
Basophils Absolute: 58 cells/uL (ref 0–200)
Basophils Relative: 0.7 %
Eosinophils Absolute: 299 cells/uL (ref 15–500)
Eosinophils Relative: 3.6 %
HCT: 35.2 % — ABNORMAL LOW (ref 38.5–50.0)
Hemoglobin: 11 g/dL — ABNORMAL LOW (ref 13.2–17.1)
Lymphs Abs: 1112 cells/uL (ref 850–3900)
MCH: 26.6 pg — ABNORMAL LOW (ref 27.0–33.0)
MCHC: 31.3 g/dL — ABNORMAL LOW (ref 32.0–36.0)
MCV: 85 fL (ref 80.0–100.0)
MPV: 9.4 fL (ref 7.5–12.5)
Monocytes Relative: 5.4 %
Neutro Abs: 6383 cells/uL (ref 1500–7800)
Neutrophils Relative %: 76.9 %
Platelets: 385 10*3/uL (ref 140–400)
RBC: 4.14 10*6/uL — ABNORMAL LOW (ref 4.20–5.80)
RDW: 16.2 % — ABNORMAL HIGH (ref 11.0–15.0)
Total Lymphocyte: 13.4 %
WBC: 8.3 10*3/uL (ref 3.8–10.8)

## 2021-06-17 LAB — C-REACTIVE PROTEIN: CRP: 101 mg/L — ABNORMAL HIGH (ref <8.0)

## 2021-06-17 LAB — SEDIMENTATION RATE: Sed Rate: 65 mm/h — ABNORMAL HIGH (ref 0–20)

## 2021-09-18 ENCOUNTER — Other Ambulatory Visit: Payer: Self-pay

## 2021-09-18 ENCOUNTER — Encounter: Payer: Self-pay | Admitting: Infectious Disease

## 2021-09-18 ENCOUNTER — Ambulatory Visit (INDEPENDENT_AMBULATORY_CARE_PROVIDER_SITE_OTHER): Payer: 59 | Admitting: Infectious Disease

## 2021-09-18 VITALS — BP 168/80 | HR 77 | Temp 97.9°F | Wt 312.0 lb

## 2021-09-18 DIAGNOSIS — T8453XD Infection and inflammatory reaction due to internal right knee prosthesis, subsequent encounter: Secondary | ICD-10-CM | POA: Diagnosis not present

## 2021-09-18 DIAGNOSIS — M009 Pyogenic arthritis, unspecified: Secondary | ICD-10-CM | POA: Diagnosis not present

## 2021-09-18 DIAGNOSIS — A280 Pasteurellosis: Secondary | ICD-10-CM | POA: Diagnosis not present

## 2021-09-18 DIAGNOSIS — M71161 Other infective bursitis, right knee: Secondary | ICD-10-CM | POA: Diagnosis not present

## 2021-09-18 DIAGNOSIS — M069 Rheumatoid arthritis, unspecified: Secondary | ICD-10-CM

## 2021-09-18 DIAGNOSIS — I872 Venous insufficiency (chronic) (peripheral): Secondary | ICD-10-CM

## 2021-09-18 MED ORDER — AMOXICILLIN 500 MG PO CAPS: 500.0000 mg | ORAL_CAPSULE | Freq: Three times a day (TID) | ORAL | 3 refills | Status: DC

## 2021-09-18 NOTE — Progress Notes (Signed)
Subjective:  Chief complaint: Increased pain in the knee recently  Patient ID: [name], male    DOB: 14-Jun-1961, 60 y.o.   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  He then had prostatic joint infection with Staphylococcus lugdunensis and (methicillin sensitive in 2017 which was treated with daptomycin along with rifampin after I&D and polyexchange.  He apparently developed a vancomycin allergy at that time.  He completed 6 weeks of therapy and then stopped.  He then returned in 2017 with knee pain and swelling and now was found again to have prosthetic joint infection was taken to the operating room but nothing grew from culture and he was treated for 6 weeks with daptomycin after I&D and polyexchange he is followed by Dr. Linus Salmons and placed on Augmentin in February 2018 and completed at least 5 months of oral therapy.  He then underwent additional I&D with pollex change in November 2018 recurrent right knee infection again with negative cultures.  Then had aspirate the knee that grew Citrobacter koseri that was fairly sensitive and underwent another I&D with polyexchange in May 2021.  He was treated with protracted oral ciprofloxacin but was having loosening of his prosthesis and was concern for compressive infection and underwent I&D with resection arthroplasty now with an antibiotic spacer placed in September 2021.  OR cultures now yielded ampicillin sensitive Enterococcus faecalis.  He completed 6 weeks of IV Zosyn on October 22.  After that he was placed on Augmentin 875 125 twice daily until February 2.  It was then stopped and he ultimately underwent reimplantation of right total knee arthroplasty on December 07, 2020.  Cultures in the OR at the time of surgery were without growth.  He was doing well until few days prior to admission this June 2022 he was admitted by orthopedics due to concerns for recurrence of prosthetic joint infection.  He was having increased discomfort drainage from an anterior aspect of the knee.  He underwent I&D by Dr. Maureen Ralphs on June 22.  Cultures done in the  OR grew Pasteurella species.  Patient had been in the interim discharged on daptomycin and ceftriaxone prior to cultures resulting.  He was seen and followed by Dr.Manandhar and changed to continuous dose ampicillin with plans to finish that course today followed by oral amoxicillin.  In asking him closely at last visit he told me he did  have a boxer that likes to lick his feet and also likes to jump into his lap frequently and sometimes scratches his skin.  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  I went to great lengths to explain that Pasteurella is a normal part of the oral flora of dogs and that the dog is not unhealthy and is in no way doing anything wrong with his behaviors though I did caution him to try to protect his skin and in particular his wounds from being scratched by his dog.  Pain was better several visits ago.  He was continue to have drainage from the knee.  He says that more recently he was having less drainage but more pain in the knee.  He then began doing more  physical therapy and there was more drainage that began coming from the knee but he was also experiencing less pain.  Is scheduled to see Dr. Despina Hick tomorrow.     Past Medical History:  Diagnosis Date   Allergy    OCC    Anemia    hx of   Asthma    Cataract    small- forming    Cellulitis of lower extremity    "usually RLL; this time it's got up to my lower abdoment" (02/11/2014)-series of antibiotics completed 02-28-14   Diverticulosis     Edema    legs   GERD (gastroesophageal reflux disease)    Hypertension    Infection of right knee (HCC)    OSA (obstructive sleep apnea)    does not use cpap    Pasteurella infection 05/17/2021   Pneumonia    20+ years ago   PONV (postoperative nausea and vomiting)    Rheumatoid arthritis (HCC) dx'd ~ 1977   S/P PICC central line placement 03-16-14   right upper arm remains intact.05-11-14 removed 1 week ago.   Sleep apnea    no cpap     Past Surgical History:  Procedure Laterality Date   COLONOSCOPY  2012   EXCISIONAL HEMORRHOIDECTOMY     EXCISIONAL TOTAL KNEE ARTHROPLASTY WITH ANTIBIOTIC SPACERS Right 03/18/2014   Procedure: RIGHT KNEE RESECTION ARTHROPLASTY WITH ANTIBIOTIC SPACERS;  Surgeon: Loanne Drilling, MD;  Location: [facility];  Service: Orthopedics;  Laterality: Right;   EXCISIONAL TOTAL KNEE ARTHROPLASTY WITH ANTIBIOTIC SPACERS Right 06/22/2020   Procedure: Right knee resection arthroplasty, antibiotic spacer;  Surgeon: Ollen Gross, MD;  Location: [facility];  Service: Orthopedics;  Laterality: Right;    FOREIGN BODY REMOVAL Left    "BB" removal above left eye-teen yrs.   HERNIA REPAIR     I & D KNEE WITH POLY EXCHANGE Right 04/25/2016   Procedure: RIGHT KNEE IRRIGATION AND DEBRIDEMENT WITH POLY EXCHANGE;  Surgeon: Ollen Gross, MD;  Location: [facility];  Service: Orthopedics;  Laterality: Right;   I & D KNEE WITH POLY EXCHANGE Right 10/01/2016   Procedure: IRRIGATION AND DEBRIDEMENT KNEE WITH POLY EXCHANGE;  Surgeon: Ollen Gross, MD;  Location: [facility];  Service: Orthopedics;  Laterality: Right;   I & D KNEE WITH POLY EXCHANGE Right 08/26/2017   Procedure: IRRIGATION AND DEBRIDEMENT KNEE WITH POLY EXCHANGE;  Surgeon: Ollen Gross, MD;  Location: [facility];  Service: Orthopedics;  Laterality: Right;   I & D KNEE WITH POLY EXCHANGE Right 11/16/2019   Procedure: IRRIGATION AND DEBRIDEMENT KNEE WITH POLY EXCHANGE;  Surgeon: Ollen Gross, MD;  Location: [facility];  Service:  Orthopedics;  Laterality: Right;    I & D KNEE WITH POLY EXCHANGE Right 02/22/2020   Procedure: Right knee irrigation and debridement;  Surgeon: Ollen Gross, MD;  Location: [facility];  Service: Orthopedics;  Laterality: Right;    INCISION AND DRAINAGE Right 09/28/2014   Procedure: INCISION AND DRAINAGE RIGHT KNEE;  Surgeon: Loanne Drilling, MD;  Location: [facility];  Service: Orthopedics;  Laterality: Right;   IR FLUORO GUIDE CV LINE RIGHT  04/07/2021   IR REMOVAL TUN CV CATH W/O FL  05/17/2021   IR US GUIDE VASC ACCESS RIGHT  04/07/2021   IRRIGATION AND DEBRIDEMENT KNEE Right 04/05/2021   Procedure: IRRIGATION AND DEBRIDEMENT KNEE;  Surgeon: Ollen Gross, MD;  Location: [facility];  Service: Orthopedics;  Laterality: Right;   KNEE BURSECTOMY Right 03/13/2017   Procedure: RIGHT KNEE PREPATELLAR  BURSECTOMY;  Surgeon: Gaynelle Arabian, MD;  Location: [facility];  Service: Orthopedics;  Laterality: Right;   picc line placement Right    right upper arm   REIMPLANTATION OF TOTAL KNEE Right 12/07/2020   Procedure: REIMPLANTATION OF RIGHT TOTAL KNEE;  Surgeon: Gaynelle Arabian, MD;  Location: [facility];  Service: Orthopedics;  Laterality: Right;  2 hrs   REPLACEMENT TOTAL HIP W/  RESURFACING IMPLANTS Bilateral 01/2001; 08/2010   "left; right"   REPLACEMENT TOTAL KNEE BILATERAL Bilateral 10/1991; 10/2006   "right; left"   REVISION TOTAL KNEE ARTHROPLASTY Right 2012   SPIGELIAN HERNIA Right 12/04/2015   Procedure: INCARCERATED SPIGELIAN HERNIA REPAIR WITH MESH;  Surgeon: Georganna Skeans, MD;  Location: [facility];  Service: General;  Laterality: Right;   TOTAL KNEE ARTHROPLASTY Right 05/21/2014   Procedure: RIGHT KNEE ARTHROPLASTY REINPLANTATION;  Surgeon: [name], MD;  Location: [facility];  Service: Orthopedics;  Laterality: Right;    Family History  Problem Relation Age of Onset   Colon cancer Mother 39       passed age 86 - stage 107    Hypertension Mother    Diabetes Mellitus II Paternal Grandmother    Colon  polyps Neg Hx    Esophageal cancer Neg Hx    Stomach cancer Neg Hx    Rectal cancer Neg Hx       Social History   Socioeconomic History   Marital status: Married    Spouse name: Not on file   Number of children: 1   Years of education: 14   Highest education level: Not on file  Occupational History   Not on file  Tobacco Use   Smoking status: Former    Packs/day: 1.00    Years: 3.00    Pack years: 3.00    Types: Cigarettes    Quit date: 03/16/1981    Years since quitting: 40.5   Smokeless tobacco: Current    Types: Snuff   Tobacco comments:    "quit smoking cigarettes in the 1980's"  Vaping Use   Vaping Use: Never used  Substance and Sexual Activity   Alcohol use: No   Drug use: No   Sexual activity: Yes    Partners: Male  Other Topics Concern   Not on file  Social History Narrative   Not on file   Social Determinants of Health   Financial Resource Strain: Not on file  Food Insecurity: Not on file  Transportation Needs: Not on file  Physical Activity: Not on file  Stress: Not on file  Social Connections: Not on file    Allergies  Allergen Reactions   Avelox [Moxifloxacin Hcl In Nacl] Hives, Shortness Of Breath and Itching    Tolerates Cipro   Chlorhexidine     Skin red and burning after skin wash   Doxycycline Itching   Sulfa Antibiotics Itching   Vancomycin Hives and Itching   E.E.S. [Erythromycin] Rash   Erythrocin Rash   Sulfasalazine Rash     Current Outpatient Medications:    amoxicillin (AMOXIL) 500 MG capsule, Take 1 capsule (500 mg total) by mouth 3 (three) times daily., Disp: 270 capsule, Rfl: 3   cholecalciferol (VITAMIN D3) 25 MCG (1000 UNIT) tablet, Take 1,000 Units by mouth daily., Disp: , Rfl:    diltiazem (CARDIZEM CD) 180 MG 24 hr capsule, Take 1 capsule (180 mg total) by mouth daily., Disp: 30 capsule, Rfl: 2   diltiazem (TIAZAC) 180 MG 24 hr capsule, , Disp: , Rfl:  Fluticasone-Salmeterol (ADVAIR) 250-50 MCG/DOSE AEPB, Inhale 1  puff into the lungs daily., Disp: , Rfl:    Ipratropium-Albuterol (COMBIVENT RESPIMAT) 20-100 MCG/ACT AERS respimat, Inhale 1 puff into the lungs every 6 (six) hours as needed for wheezing or shortness of breath., Disp: , Rfl:    losartan (COZAAR) 100 MG tablet, Take 100 mg by mouth daily. , Disp: , Rfl:    methocarbamol (ROBAXIN) 500 MG tablet, Take 1 tablet (500 mg total) by mouth every 6 (six) hours as needed for muscle spasms., Disp: 40 tablet, Rfl: 0   methotrexate (RHEUMATREX) 2.5 MG tablet, Take 10 mg by mouth every Monday. Caution:Chemotherapy. Protect from light., Disp: , Rfl:    MOUNJARO 5 MG/0.5ML Pen, SMARTSIG:5 Milligram(s) SUB-Q Once a Week, Disp: , Rfl:    Multiple Vitamin (MULTIVITAMIN WITH MINERALS) TABS tablet, Take 1 tablet by mouth daily., Disp: , Rfl:    nystatin (MYCOSTATIN/NYSTOP) powder, Apply 1 application topically 3 (three) times daily., Disp: 30 g, Rfl: 5   oxyCODONE (OXY IR/ROXICODONE) 5 MG immediate release tablet, Take 1-2 tablets (5-10 mg total) by mouth every 4 (four) hours as needed for severe pain. (Patient not taking: Reported on 06/16/2021), Disp: 30 tablet, Rfl: 0   RABEprazole (ACIPHEX) 20 MG tablet, Take 20 mg by mouth daily before breakfast. , Disp: , Rfl:    RYBELSUS 7 MG TABS, Take 7 mg by mouth every morning., Disp: , Rfl:    traMADol (ULTRAM) 50 MG tablet, Take 1-2 tablets (50-100 mg total) by mouth every 6 (six) hours as needed for moderate pain., Disp: 40 tablet, Rfl: 0   triamterene-hydrochlorothiazide (MAXZIDE-25) 37.5-25 MG tablet, Take 1 tablet by mouth daily., Disp: , Rfl:    Review of Systems  Constitutional:  Negative for activity change, appetite change, chills, diaphoresis, fatigue, fever and unexpected weight change.  HENT:  Negative for congestion, rhinorrhea, sinus pressure, sneezing, sore throat and trouble swallowing.   Eyes:  Negative for photophobia and visual disturbance.  Respiratory:  Negative for cough, chest tightness, shortness of  breath, wheezing and stridor.   Cardiovascular:  Positive for leg swelling. Negative for chest pain and palpitations.  Gastrointestinal:  Negative for abdominal distention, abdominal pain, anal bleeding, blood in stool, constipation, diarrhea, nausea and vomiting.  Genitourinary:  Negative for difficulty urinating, dysuria, flank pain and hematuria.  Musculoskeletal:  Positive for joint swelling. Negative for arthralgias, back pain, gait problem and myalgias.  Skin:  Negative for color change, pallor, rash and wound.  Neurological:  Negative for dizziness, tremors, weakness and light-headedness.  Hematological:  Negative for adenopathy. Does not bruise/bleed easily.  Psychiatric/Behavioral:  Negative for agitation, behavioral problems, confusion, decreased concentration, dysphoric mood and sleep disturbance.       Objective:   Physical Exam Constitutional:      Appearance: He is well-developed.  HENT:     Head: Normocephalic and atraumatic.  Eyes:     Conjunctiva/sclera: Conjunctivae normal.  Cardiovascular:     Rate and Rhythm: Normal rate and regular rhythm.  Pulmonary:     Effort: Pulmonary effort is normal. No respiratory distress.     Breath sounds: No wheezing.  Abdominal:     General: There is no distension.     Palpations: Abdomen is soft.  Musculoskeletal:        General: No tenderness. Normal range of motion.     Cervical back: Normal range of motion and neck supple.  Skin:    General: Skin is warm and dry.  Coloration: Skin is not pale.     Findings: No erythema or rash.  Neurological:     General: No focal deficit present.     Mental Status: He is alert and oriented to person, place, and time.  Psychiatric:        Mood and Affect: Mood normal.        Behavior: Behavior normal.        Thought Content: Thought content normal.        Judgment: Judgment normal.    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  I will continue his prescription for amoxicillin 500 mg 3 times daily  I fear he is going to need a fusion vs AKA if he cannot gain better control of this infection with chronic antibiotics.  Schedule him to see me back in February.  He is counseled on signs and symptoms of worsening infection including pain increased drainage.  We certainly can get him in with one of my partners including Dr. [place] Bali and Dr. Linus Salmons who have both seen him several times.  Rheumatoid arthritis: On methotrexate  Venous stasis changes:: he believes this has a great deal to do with drainage occurring     There were no vitals filed for this visit.  There is no height or weight on file to calculate BMI.

## 2021-09-19 LAB — CBC WITH DIFFERENTIAL/PLATELET
Absolute Monocytes: 418 cells/uL (ref 200–950)
Basophils Absolute: 62 cells/uL (ref 0–200)
Basophils Relative: 0.7 %
Eosinophils Absolute: 320 cells/uL (ref 15–500)
Eosinophils Relative: 3.6 %
HCT: 37.1 % — ABNORMAL LOW (ref 38.5–50.0)
Hemoglobin: 11.8 g/dL — ABNORMAL LOW (ref 13.2–17.1)
Lymphs Abs: 1451 cells/uL (ref 850–3900)
MCH: 25.9 pg — ABNORMAL LOW (ref 27.0–33.0)
MCHC: 31.8 g/dL — ABNORMAL LOW (ref 32.0–36.0)
MCV: 81.5 fL (ref 80.0–100.0)
MPV: 9.2 fL (ref 7.5–12.5)
Monocytes Relative: 4.7 %
Neutro Abs: 6648 cells/uL (ref 1500–7800)
Neutrophils Relative %: 74.7 %
Platelets: 401 10*3/uL — ABNORMAL HIGH (ref 140–400)
RBC: 4.55 10*6/uL (ref 4.20–5.80)
RDW: 16.6 % — ABNORMAL HIGH (ref 11.0–15.0)
Total Lymphocyte: 16.3 %
WBC: 8.9 10*3/uL (ref 3.8–10.8)

## 2021-09-19 LAB — BASIC METABOLIC PANEL WITH GFR
BUN: 13 mg/dL (ref 7–25)
CO2: 23 mmol/L (ref 20–32)
Calcium: 9 mg/dL (ref 8.6–10.3)
Chloride: 105 mmol/L (ref 98–110)
Creat: 1.02 mg/dL (ref 0.70–1.35)
Glucose, Bld: 92 mg/dL (ref 65–99)
Potassium: 4.4 mmol/L (ref 3.5–5.3)
Sodium: 139 mmol/L (ref 135–146)
eGFR: 84 mL/min/{1.73_m2} (ref >=60)

## 2021-09-19 LAB — C-REACTIVE PROTEIN: CRP: 74.3 mg/L — ABNORMAL HIGH (ref <8.0)

## 2021-09-19 LAB — SEDIMENTATION RATE: Sed Rate: 63 mm/h — ABNORMAL HIGH (ref 0–20)

## 2021-10-17 DIAGNOSIS — Z8619 Personal history of other infectious and parasitic diseases: Secondary | ICD-10-CM | POA: Diagnosis not present

## 2021-11-15 ENCOUNTER — Ambulatory Visit: Payer: 59 | Admitting: Infectious Disease

## 2021-11-16 ENCOUNTER — Other Ambulatory Visit (HOSPITAL_COMMUNITY): Payer: Self-pay

## 2021-11-16 MED ORDER — MOUNJARO 12.5 MG/0.5ML ~~LOC~~ SOAJ
12.5000 mg | SUBCUTANEOUS | 6 refills | Status: AC
  Filled 2021-11-16: qty 2, 28d supply, fill #0
  Filled 2021-12-14: qty 2, 28d supply, fill #1
  Filled 2022-01-10: qty 2, 28d supply, fill #2
  Filled 2022-02-06: qty 2, 28d supply, fill #3
  Filled 2022-03-06: qty 2, 28d supply, fill #4
  Filled 2022-05-01 – 2022-05-16 (×5): qty 2, 28d supply, fill #5

## 2021-11-26 NOTE — Progress Notes (Signed)
Subjective:  Chief complaint: Recent knee pain that may have improved after switching to ciprofloxacin  Patient ID: [name], male    DOB: 1961/06/27, 61 y.o.   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  He then had prostatic joint infection with Staphylococcus lugdunensis and (methicillin sensitive in 2017 which was treated with daptomycin along with rifampin after I&D and polyexchange.  He apparently developed a vancomycin allergy at that time.  He completed 6 weeks of therapy and then stopped.  He then returned in 2017 with knee pain and swelling and now was found again to have prosthetic joint infection was taken to the operating room but nothing grew from culture and he was treated for 6 weeks with daptomycin after I&D and polyexchange he is followed by Dr. Linus Salmons and placed on Augmentin in February 2018 and completed at least 5 months of oral therapy.  He then underwent additional I&D with pollex change in November 2018 recurrent right knee infection again with negative cultures.  Then had aspirate the knee that grew Citrobacter koseri that was fairly sensitive and underwent another I&D with polyexchange in May 2021.  He was treated with protracted oral ciprofloxacin but was having loosening of his prosthesis and was concern for compressive infection and underwent I&D with resection arthroplasty now with an antibiotic spacer placed in September 2021.  OR cultures now yielded ampicillin sensitive Enterococcus faecalis.  He completed 6 weeks of IV Zosyn on October 22.  After that he was placed on Augmentin 875 125 twice daily until February 2.  It was then stopped and he ultimately underwent reimplantation of right total  knee arthroplasty on December 07, 2020.  Cultures in the OR at the time of surgery were without growth.  He was doing well until few days prior to admission this June 2022 he was admitted by orthopedics due to concerns for recurrence of prosthetic joint infection.  He was having increased discomfort drainage from an anterior aspect of the knee.  He underwent I&D by Dr. Maureen Ralphs on June 22.  Cultures done in the  OR grew Pasteurella species.  Patient had been in the interim discharged on daptomycin and ceftriaxone prior to cultures resulting.  He was seen and followed by Dr.Manandhar and changed to continuous dose ampicillin the patient completed on followed with oral amoxicillin.   In asking him closely at last visit he told me he did  have a boxer that likes to lick his feet and also likes to jump into his lap frequently and sometimes scratches his skin.  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  I went to great lengths  several visits ago to explain that Pasteurella is a normal part of the oral flora of dogs and that the dog is not unhealthy and is in no way doing anything wrong with his behaviors though I did caution him to try to protect his skin and in particular his wounds from being scratched by his dog.  Since I last saw [name] he saw Dr. Maureen Ralphs who rated his knee and obtained cell count differential I do not have access to as well as a culture that grew a  staphylococcal species that was sensitive "against all antibiotics against present was tested.  Amoxicillin indeed would NOT be active vs even MSSA. He was switched to ciprofloxacin and is experiencing less pain in the knee since then.  I am trying to get sensitivity data from [facility] so I can make changes antibiotics at least I do not like the idea of him being on ciprofloxacin long-term.  He is currently not having drainage  from the     Past Medical History:  Diagnosis Date   Allergy    OCC    Anemia    hx of   Asthma    Cataract    small- forming    Cellulitis of lower extremity    "usually RLL; this time it's got up to my lower abdoment" (02/11/2014)-series of antibiotics completed 02-28-14   Diverticulosis    Edema    legs   GERD (gastroesophageal reflux disease)    Hypertension    Infection of right knee (HCC)    OSA (obstructive sleep apnea)    does not use cpap    Pasteurella infection 05/17/2021   Pneumonia    20+ years ago   PONV (postoperative nausea and vomiting)    Rheumatoid arthritis (HCC) dx'd ~ 1977   S/P PICC central line placement 03-16-14   right upper arm remains intact.05-11-14 removed 1 week ago.   Sleep apnea    no cpap     Past Surgical History:  Procedure Laterality Date   COLONOSCOPY  2012   EXCISIONAL HEMORRHOIDECTOMY     EXCISIONAL TOTAL KNEE ARTHROPLASTY WITH ANTIBIOTIC SPACERS Right 03/18/2014   Procedure: RIGHT KNEE RESECTION ARTHROPLASTY WITH ANTIBIOTIC SPACERS;  Surgeon: Loanne Drilling, MD;  [facility];  Service: Orthopedics;  Laterality: Right;   EXCISIONAL TOTAL KNEE ARTHROPLASTY WITH ANTIBIOTIC SPACERS Right 06/22/2020   Procedure: Right knee resection arthroplasty, antibiotic spacer;  Surgeon: Ollen Gross, MD;  [facility];  Service: Orthopedics;  Laterality: Right;    FOREIGN BODY REMOVAL Left    "BB" removal above left eye-teen yrs.   HERNIA REPAIR     I & D KNEE WITH POLY EXCHANGE Right 04/25/2016   Procedure: RIGHT KNEE IRRIGATION AND DEBRIDEMENT WITH POLY EXCHANGE;  Surgeon: Ollen Gross, MD;  [facility];  Service: Orthopedics;  Laterality: Right;   I & D KNEE WITH POLY EXCHANGE Right 10/01/2016   Procedure: IRRIGATION AND DEBRIDEMENT KNEE WITH POLY EXCHANGE;  Surgeon: Ollen Gross, MD;  [facility];  Service: Orthopedics;  Laterality: Right;   I & D KNEE WITH POLY EXCHANGE Right 08/26/2017   Procedure: IRRIGATION AND  DEBRIDEMENT KNEE WITH POLY EXCHANGE;  Surgeon: Ollen Gross, MD;  [facility];  Service: Orthopedics;  Laterality: Right;   I & D KNEE WITH POLY EXCHANGE Right 11/16/2019   Procedure: IRRIGATION AND DEBRIDEMENT KNEE WITH POLY EXCHANGE;  Surgeon: Ollen Gross, MD;  [facility];  Service: Orthopedics;  Laterality: Right;    I & D KNEE WITH POLY EXCHANGE Right 02/22/2020   Procedure: Right knee irrigation and debridement;  Surgeon: Ollen Gross, MD;  [facility];  Service: Orthopedics;  Laterality: Right;    INCISION AND DRAINAGE Right 09/28/2014   Procedure: INCISION AND DRAINAGE RIGHT KNEE;  Surgeon: Loanne Drilling, MD;  [facility];  Service: Orthopedics;  Laterality: Right;   IR FLUORO GUIDE CV LINE RIGHT  04/07/2021   IR REMOVAL TUN CV CATH W/O FL  05/17/2021  IR US GUIDE VASC ACCESS RIGHT  04/07/2021   IRRIGATION AND DEBRIDEMENT KNEE Right 04/05/2021   Procedure: IRRIGATION AND DEBRIDEMENT KNEE;  Surgeon: Gaynelle Arabian, MD;  [facility];  Service: Orthopedics;  Laterality: Right;   KNEE BURSECTOMY Right 03/13/2017   Procedure: RIGHT KNEE PREPATELLAR BURSECTOMY;  Surgeon: Gaynelle Arabian, MD;  [facility];  Service: Orthopedics;  Laterality: Right;   picc line placement Right    right upper arm   REIMPLANTATION OF TOTAL KNEE Right 12/07/2020   Procedure: REIMPLANTATION OF RIGHT TOTAL KNEE;  Surgeon: Gaynelle Arabian, MD;  [facility];  Service: Orthopedics;  Laterality: Right;  2 hrs   REPLACEMENT TOTAL HIP W/  RESURFACING IMPLANTS Bilateral 01/2001; 08/2010   "left; right"   REPLACEMENT TOTAL KNEE BILATERAL Bilateral 10/1991; 10/2006   "right; left"   REVISION TOTAL KNEE ARTHROPLASTY Right 2012   SPIGELIAN HERNIA Right 12/04/2015   Procedure: INCARCERATED SPIGELIAN HERNIA REPAIR WITH MESH;  Surgeon: Georganna Skeans, MD;  Location: [facility];  Service: General;  Laterality: Right;   TOTAL KNEE ARTHROPLASTY Right 05/21/2014   Procedure: RIGHT KNEE  ARTHROPLASTY REINPLANTATION;  Surgeon: [name], MD;  [facility];  Service: Orthopedics;  Laterality: Right;    Family History  Problem Relation Age of Onset   Colon cancer Mother 31       passed age 2 - stage 18    Hypertension Mother    Diabetes Mellitus II Paternal Grandmother    Colon polyps Neg Hx    Esophageal cancer Neg Hx    Stomach cancer Neg Hx    Rectal cancer Neg Hx       Social History   Socioeconomic History   Marital status: Married    Spouse name: Not on file   Number of children: 1   Years of education: 14   Highest education level: Not on file  Occupational History   Not on file  Tobacco Use   Smoking status: Former    Packs/day: 1.00    Years: 3.00    Pack years: 3.00    Types: Cigarettes    Quit date: 03/16/1981    Years since quitting: 40.7   Smokeless tobacco: Current    Types: Snuff   Tobacco comments:    "quit smoking cigarettes in the 1980's"  Vaping Use   Vaping Use: Never used  Substance and Sexual Activity   Alcohol use: No   Drug use: No   Sexual activity: Yes    Partners: Male  Other Topics Concern   Not on file  Social History Narrative   Not on file   Social Determinants of Health   Financial Resource Strain: Not on file  Food Insecurity: Not on file  Transportation Needs: Not on file  Physical Activity: Not on file  Stress: Not on file  Social Connections: Not on file    Allergies  Allergen Reactions   Avelox [Moxifloxacin Hcl In Nacl] Hives, Shortness Of Breath and Itching    Tolerates Cipro   Chlorhexidine     Skin red and burning after skin wash   Doxycycline Itching   Sulfa Antibiotics Itching   Vancomycin Hives and Itching   E.E.S. [Erythromycin] Rash   Erythrocin Rash   Sulfasalazine Rash     Current Outpatient Medications:    amoxicillin (AMOXIL) 500 MG capsule, Take 1 capsule (500 mg total) by mouth 3 (three) times daily., Disp: 270 capsule, Rfl: 3   cholecalciferol (VITAMIN D3) 25 MCG  (  1000 UNIT) tablet, Take 1,000 Units by mouth daily., Disp: , Rfl:    diltiazem (CARDIZEM CD) 180 MG 24 hr capsule, Take 1 capsule (180 mg total) by mouth daily., Disp: 30 capsule, Rfl: 2   diltiazem (TIAZAC) 180 MG 24 hr capsule, , Disp: , Rfl:    Fluticasone-Salmeterol (ADVAIR) 250-50 MCG/DOSE AEPB, Inhale 1 puff into the lungs daily., Disp: , Rfl:    Ipratropium-Albuterol (COMBIVENT RESPIMAT) 20-100 MCG/ACT AERS respimat, Inhale 1 puff into the lungs every 6 (six) hours as needed for wheezing or shortness of breath., Disp: , Rfl:    losartan (COZAAR) 100 MG tablet, Take 100 mg by mouth daily. , Disp: , Rfl:    methocarbamol (ROBAXIN) 500 MG tablet, Take 1 tablet (500 mg total) by mouth every 6 (six) hours as needed for muscle spasms., Disp: 40 tablet, Rfl: 0   methotrexate (RHEUMATREX) 2.5 MG tablet, Take 10 mg by mouth every Monday. Caution:Chemotherapy. Protect from light., Disp: , Rfl:    MOUNJARO 5 MG/0.5ML Pen, SMARTSIG:5 Milligram(s) SUB-Q Once a Week, Disp: , Rfl:    Multiple Vitamin (MULTIVITAMIN WITH MINERALS) TABS tablet, Take 1 tablet by mouth daily., Disp: , Rfl:    nystatin (MYCOSTATIN/NYSTOP) powder, Apply 1 application topically 3 (three) times daily., Disp: 30 g, Rfl: 5   oxyCODONE (OXY IR/ROXICODONE) 5 MG immediate release tablet, Take 1-2 tablets (5-10 mg total) by mouth every 4 (four) hours as needed for severe pain. (Patient not taking: Reported on 06/16/2021), Disp: 30 tablet, Rfl: 0   RABEprazole (ACIPHEX) 20 MG tablet, Take 20 mg by mouth daily before breakfast. , Disp: , Rfl:    RYBELSUS 7 MG TABS, Take 7 mg by mouth every morning., Disp: , Rfl:    tirzepatide (MOUNJARO) 10 MG/0.5ML Pen, Mounjaro 10 mg/0.5 mL subcutaneous pen injector  INJECT 10MG  SUBCUTANEOUS ONCE A WEEK 30 DAYS, Disp: , Rfl:    tirzepatide (MOUNJARO) 12.5 MG/0.5ML Pen, Inject 12.5 mg into the skin once a week., Disp: 2 mL, Rfl: 6   traMADol (ULTRAM) 50 MG tablet, Take 1-2 tablets (50-100 mg total) by mouth  every 6 (six) hours as needed for moderate pain., Disp: 40 tablet, Rfl: 0   triamterene-hydrochlorothiazide (MAXZIDE-25) 37.5-25 MG tablet, Take 1 tablet by mouth daily., Disp: , Rfl:    Review of Systems  Constitutional:  Negative for chills and fever.  HENT:  Negative for congestion and sore throat.   Eyes:  Negative for photophobia.  Respiratory:  Negative for cough, shortness of breath and wheezing.   Cardiovascular:  Negative for chest pain, palpitations and leg swelling.  Gastrointestinal:  Negative for abdominal pain, blood in stool, constipation, diarrhea, nausea and vomiting.  Genitourinary:  Negative for dysuria, flank pain and hematuria.  Musculoskeletal:  Positive for arthralgias and joint swelling. Negative for back pain and myalgias.  Skin:  Negative for rash.  Neurological:  Negative for dizziness, weakness and headaches.  Hematological:  Does not bruise/bleed easily.  Psychiatric/Behavioral:  Negative for agitation, decreased concentration, dysphoric mood, sleep disturbance and suicidal ideas.       Objective:   Physical Exam Constitutional:      Appearance: He is well-developed.  HENT:     Head: Normocephalic and atraumatic.  Eyes:     Conjunctiva/sclera: Conjunctivae normal.  Cardiovascular:     Rate and Rhythm: Normal rate and regular rhythm.  Pulmonary:     Effort: Pulmonary effort is normal. No respiratory distress.     Breath sounds: No wheezing.  Abdominal:  General: There is no distension.     Palpations: Abdomen is soft.  Musculoskeletal:        General: No tenderness. Normal range of motion.     Cervical back: Normal range of motion and neck supple.  Skin:    General: Skin is warm and dry.     Coloration: Skin is not pale.     Findings: No erythema or rash.  Neurological:     General: No focal deficit present.     Mental Status: He is alert and oriented to person, place, and time.  Psychiatric:        Mood and Affect: Mood normal.         Behavior: Behavior normal.        Thought Content: Thought content normal.        Judgment: Judgment normal.    Right knee 05/17/2021:        Knee 09/18/2021:    His wife accompanied him today to clinic and showed me pictures of the knee from earlier this morning.  Both legs have been wrapped and in view of using compression wrapping try to help with his venous stasis changes    Assessment & Plan:   Prostatic joint infection with multiple recurrences most recent with Pasteurella isolated now again since then a staphylococcal species isolated.  If it is a methicillin sensitive species I would like to use Augmentin long-term as this would cover this the Pasteurella as well as am sensitive staphylococcal species.  Ciprofloxacin is certainly okay for now but I worry about risk for C. difficile colitis and about Staphylococcus developing resistance to fluoroquinolones.  I will check sed rate CRP CMP CBC with differential.  I have gotten in touch with Dr. Maureen Ralphs who is going to get me sensitivity data today  Rheumatoid arthritis on methotrexate:  HTN: BP up in clinic, white coat component he will followup with primary care.  Vitals:   11/27/21 1130  BP: (!) 173/93  Pulse: 85  Temp: 98.1 F (36.7 C)  SpO2: 95%

## 2021-11-27 ENCOUNTER — Other Ambulatory Visit: Payer: Self-pay

## 2021-11-27 ENCOUNTER — Ambulatory Visit (INDEPENDENT_AMBULATORY_CARE_PROVIDER_SITE_OTHER): Payer: 59 | Admitting: Infectious Disease

## 2021-11-27 ENCOUNTER — Encounter: Payer: Self-pay | Admitting: Infectious Disease

## 2021-11-27 VITALS — BP 173/93 | HR 85 | Temp 98.1°F | Ht 68.0 in | Wt 298.0 lb

## 2021-11-27 DIAGNOSIS — M069 Rheumatoid arthritis, unspecified: Secondary | ICD-10-CM

## 2021-11-27 DIAGNOSIS — T8453XD Infection and inflammatory reaction due to internal right knee prosthesis, subsequent encounter: Secondary | ICD-10-CM | POA: Diagnosis not present

## 2021-11-27 DIAGNOSIS — Z23 Encounter for immunization: Secondary | ICD-10-CM

## 2021-11-27 DIAGNOSIS — I159 Secondary hypertension, unspecified: Secondary | ICD-10-CM | POA: Diagnosis not present

## 2021-11-27 DIAGNOSIS — I872 Venous insufficiency (chronic) (peripheral): Secondary | ICD-10-CM

## 2021-11-28 LAB — COMPLETE METABOLIC PANEL WITH GFR
AG Ratio: 1.1 (calc) (ref 1.0–2.5)
ALT: 11 U/L (ref 9–46)
AST: 22 U/L (ref 10–35)
Albumin: 3.6 g/dL (ref 3.6–5.1)
Alkaline phosphatase (APISO): 69 U/L (ref 35–144)
BUN: 20 mg/dL (ref 7–25)
CO2: 26 mmol/L (ref 20–32)
Calcium: 9.3 mg/dL (ref 8.6–10.3)
Chloride: 105 mmol/L (ref 98–110)
Creat: 1.02 mg/dL (ref 0.70–1.35)
Globulin: 3.4 g/dL (calc) (ref 1.9–3.7)
Glucose, Bld: 85 mg/dL (ref 65–99)
Potassium: 4.5 mmol/L (ref 3.5–5.3)
Sodium: 138 mmol/L (ref 135–146)
Total Bilirubin: 0.4 mg/dL (ref 0.2–1.2)
Total Protein: 7 g/dL (ref 6.1–8.1)
eGFR: 84 mL/min/{1.73_m2} (ref >=60)

## 2021-11-28 LAB — CBC WITH DIFFERENTIAL/PLATELET
Absolute Monocytes: 238 cells/uL (ref 200–950)
Basophils Absolute: 53 cells/uL (ref 0–200)
Basophils Relative: 0.6 %
Eosinophils Absolute: 352 cells/uL (ref 15–500)
Eosinophils Relative: 4 %
HCT: 39 % (ref 38.5–50.0)
Hemoglobin: 12.5 g/dL — ABNORMAL LOW (ref 13.2–17.1)
Lymphs Abs: 1320 cells/uL (ref 850–3900)
MCH: 26.2 pg — ABNORMAL LOW (ref 27.0–33.0)
MCHC: 32.1 g/dL (ref 32.0–36.0)
MCV: 81.8 fL (ref 80.0–100.0)
MPV: 9.1 fL (ref 7.5–12.5)
Monocytes Relative: 2.7 %
Neutro Abs: 6838 cells/uL (ref 1500–7800)
Neutrophils Relative %: 77.7 %
Platelets: 365 10*3/uL (ref 140–400)
RBC: 4.77 10*6/uL (ref 4.20–5.80)
RDW: 16.6 % — ABNORMAL HIGH (ref 11.0–15.0)
Total Lymphocyte: 15 %
WBC: 8.8 10*3/uL (ref 3.8–10.8)

## 2021-11-28 LAB — C-REACTIVE PROTEIN: CRP: 55.5 mg/L — ABNORMAL HIGH (ref <8.0)

## 2021-11-28 LAB — SEDIMENTATION RATE: Sed Rate: 53 mm/h — ABNORMAL HIGH (ref 0–20)

## 2021-11-29 ENCOUNTER — Other Ambulatory Visit: Payer: Self-pay | Admitting: Infectious Disease

## 2021-11-29 ENCOUNTER — Telehealth: Payer: Self-pay | Admitting: Infectious Disease

## 2021-11-29 MED ORDER — AMOXICILLIN-POT CLAVULANATE 875-125 MG PO TABS
1.0000 | ORAL_TABLET | Freq: Two times a day (BID) | ORAL | 11 refills | Status: DC
Start: 2021-11-29 — End: 2022-04-11

## 2021-11-29 NOTE — Telephone Encounter (Signed)
Dr. Maureen Ralphs sent me sensis on the Staphylococcal species and it was MS  Amoxicilin though would not cover even MS Staph species  Long term best antibiotic for him should be Augmentin and I will send prescription for Augmentin into his pharmacy.  He is currently taking ciprofloxacin he can continue that if he wants and finish it and then switch over to Augmentin or he can switch sooner there is a higher risk of C. difficile colitis of course with the Cipro

## 2021-12-14 ENCOUNTER — Other Ambulatory Visit (HOSPITAL_COMMUNITY): Payer: Self-pay

## 2021-12-27 DIAGNOSIS — I10 Essential (primary) hypertension: Secondary | ICD-10-CM | POA: Diagnosis not present

## 2021-12-27 DIAGNOSIS — E1121 Type 2 diabetes mellitus with diabetic nephropathy: Secondary | ICD-10-CM | POA: Diagnosis not present

## 2021-12-27 DIAGNOSIS — E785 Hyperlipidemia, unspecified: Secondary | ICD-10-CM | POA: Diagnosis not present

## 2022-01-01 ENCOUNTER — Ambulatory Visit: Payer: 59 | Admitting: Infectious Disease

## 2022-01-03 ENCOUNTER — Ambulatory Visit: Payer: 59 | Admitting: Infectious Disease

## 2022-01-10 ENCOUNTER — Other Ambulatory Visit: Payer: Self-pay

## 2022-01-10 ENCOUNTER — Ambulatory Visit (INDEPENDENT_AMBULATORY_CARE_PROVIDER_SITE_OTHER): Payer: 59 | Admitting: Infectious Disease

## 2022-01-10 ENCOUNTER — Other Ambulatory Visit (HOSPITAL_COMMUNITY): Payer: Self-pay

## 2022-01-10 ENCOUNTER — Encounter: Payer: Self-pay | Admitting: Infectious Disease

## 2022-01-10 VITALS — BP 156/96 | HR 105 | Temp 98.0°F | Wt 291.0 lb

## 2022-01-10 DIAGNOSIS — I159 Secondary hypertension, unspecified: Secondary | ICD-10-CM

## 2022-01-10 DIAGNOSIS — I872 Venous insufficiency (chronic) (peripheral): Secondary | ICD-10-CM

## 2022-01-10 DIAGNOSIS — M069 Rheumatoid arthritis, unspecified: Secondary | ICD-10-CM

## 2022-01-10 DIAGNOSIS — M71161 Other infective bursitis, right knee: Secondary | ICD-10-CM

## 2022-01-10 DIAGNOSIS — B370 Candidal stomatitis: Secondary | ICD-10-CM

## 2022-01-10 DIAGNOSIS — A280 Pasteurellosis: Secondary | ICD-10-CM

## 2022-01-10 DIAGNOSIS — T8453XD Infection and inflammatory reaction due to internal right knee prosthesis, subsequent encounter: Secondary | ICD-10-CM | POA: Diagnosis not present

## 2022-01-10 DIAGNOSIS — M009 Pyogenic arthritis, unspecified: Secondary | ICD-10-CM

## 2022-01-10 NOTE — Progress Notes (Signed)
? ?Subjective:  ?Chief complaint: Follow-up for prosthetic knee infection ? ? Patient ID: [name], male    DOB: May 20, 1961, 61 y.o.   MRN: [id number] ? ?HPI ? 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  He then had prostatic joint infection with Staphylococcus lugdunensis and (methicillin sensitive in 2017 which was treated with daptomycin along with rifampin after I&D and polyexchange.  He apparently developed a vancomycin allergy at that time.  He completed 6 weeks of therapy and then stopped.  He then returned in 2017 with knee pain and swelling and now was found again to have prosthetic joint infection was taken to the operating room but nothing grew from culture and he was treated for 6 weeks with daptomycin after I&D and polyexchange he is followed by Dr. Linus Salmons and placed on Augmentin in February 2018 and completed at least 5 months of oral therapy. ? ?He then underwent additional I&D with pollex change in November 2018 recurrent right knee infection again with negative cultures. ? ?Then had aspirate the knee that grew Citrobacter koseri that was fairly sensitive and underwent another I&D with polyexchange in May 2021.  He was treated with protracted oral ciprofloxacin but was having loosening of his prosthesis and was concern for compressive infection and underwent I&D with resection arthroplasty now with an antibiotic spacer placed in September 2021.  OR cultures now yielded ampicillin sensitive Enterococcus faecalis.  He completed 6 weeks of IV Zosyn on October 22.  After that he was placed on Augmentin 875 125 twice daily until February 2.  It was then stopped and he ultimately underwent reimplantation of right total knee arthroplasty on December 07, 2020. ? ?Cultures in the OR at the time of surgery were without growth. ? ?He was doing well until few days prior to admission this June 2022 he was admitted by orthopedics due to concerns for recurrence of prosthetic joint infection.  He was having increased discomfort drainage from an anterior aspect of the knee.  He underwent I&D by Dr. Maureen Ralphs on June 22.  Cultures done in the  OR grew Pasteurella species.  Patient had been in the interim discharged on daptomycin and ceftriaxone prior to cultures resulting.  He was seen and followed by Dr.Manandhar and changed to continuous dose ampicillin the patient completed on followed with oral amoxicillin. ? ? ?In asking him closely at last visit he told me he did  have a boxer that likes to lick his feet and also likes to jump into his lap frequently and sometimes scratches his skin.  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 ? ?I went to great lengths  several visits ago to explain that Pasteurella is a normal part of the oral flora of dogs and that the dog is not unhealthy and is in no way doing anything wrong with his behaviors though I did caution him to try to protect his skin and in particular his wounds from being scratched by his dog. ? ?Since I last saw [name] he saw Dr. Maureen Ralphs who rated his knee and obtained cell count differential I do not have access to as well as a culture that grew a staphylococcal species that was sensitive "  against all antibiotics against present was tested. ? ?Amoxicillin indeed would NOT be active vs even MSSA. He was switched to ciprofloxacin and is experiencing less pain in the knee since then. ? ?? I obtained sensitivities on the organism and it was a Staphylococcus pseudo intermedius that was sensitive to oxacillin. ? ?I have changed the patient over to Augmentin.  He has been following along with Dr.Alusio and not had further drainage of  his knee. ? ?Knee pain seems improved compared to several months ago. ? ? ? ? ?Past Medical History:  ?Diagnosis Date  ? Allergy   ? OCC   ? Anemia   ? hx of  ? Asthma   ? Cataract   ? small- forming   ? Cellulitis of lower extremity   ? "usually RLL; this time it's got up to my lower abdoment" (02/11/2014)-series of antibiotics completed 02-28-14  ? Diverticulosis   ? Edema   ? legs  ? GERD (gastroesophageal reflux disease)   ? Hypertension   ? Infection of right knee ([facility])   ? OSA (obstructive sleep apnea)   ? does not use cpap   ? Pasteurella infection 05/17/2021  ? Pneumonia   ? 20+ years ago  ? PONV (postoperative nausea and vomiting)   ? Rheumatoid arthritis ([facility]) dx'd ~ 1977  ? S/P PICC central line placement 03-16-14  ? right upper arm remains intact.05-11-14 removed 1 week ago.  ? Sleep apnea   ? no cpap   ? ? ?Past Surgical History:  ?Procedure Laterality Date  ? COLONOSCOPY  2012  ? EXCISIONAL HEMORRHOIDECTOMY    ? EXCISIONAL TOTAL KNEE ARTHROPLASTY WITH ANTIBIOTIC SPACERS Right 03/18/2014  ? Procedure: RIGHT KNEE RESECTION ARTHROPLASTY WITH ANTIBIOTIC SPACERS;  Surgeon: [name], MD;  [facility];  Service: Orthopedics;  Laterality: Right;  ? EXCISIONAL TOTAL KNEE ARTHROPLASTY WITH ANTIBIOTIC SPACERS Right 06/22/2020  ? Procedure: Right knee resection arthroplasty, antibiotic spacer;  Surgeon: Gaynelle Arabian, MD;  [facility];  Service: Orthopedics;  Laterality: Right;  171min  ? FOREIGN BODY REMOVAL Left   ? "BB" removal above left eye-teen yrs.  ? HERNIA REPAIR    ? I & D KNEE WITH POLY EXCHANGE Right 04/25/2016  ? Procedure: RIGHT KNEE IRRIGATION AND DEBRIDEMENT WITH POLY EXCHANGE;  Surgeon: Gaynelle Arabian, MD;  [facility];  Service: Orthopedics;  Laterality: Right;  ? I & D KNEE WITH POLY EXCHANGE Right 10/01/2016  ? Procedure: IRRIGATION AND DEBRIDEMENT KNEE WITH POLY EXCHANGE;  Surgeon: Gaynelle Arabian, MD;  [facility];  Service: Orthopedics;  Laterality: Right;  ? I & D KNEE WITH  POLY EXCHANGE Right 08/26/2017  ? Procedure: IRRIGATION AND DEBRIDEMENT KNEE WITH POLY EXCHANGE;  Surgeon: Gaynelle Arabian, MD;  [facility];  Service: Orthopedics;  Laterality: Right;  ? I & D KNEE WITH POLY EXCHANGE Right 11/16/2019  ? Procedure: IRRIGATION AND DEBRIDEMENT KNEE WITH POLY EXCHANGE;  Surgeon: Gaynelle Arabian, MD;  [facility];  Service: Orthopedics;  Laterality: Right;  67min  ? I & D KNEE WITH POLY EXCHANGE Right 02/22/2020  ? Procedure: Right knee irrigation and debridement;  Surgeon: Gaynelle Arabian, MD;  [facility];  Service: Orthopedics;  Laterality: Right;  76min  ? INCISION AND DRAINAGE Right 09/28/2014  ? Procedure: INCISION AND DRAINAGE RIGHT KNEE;  Surgeon: [name], MD;  [facility];  Service: Orthopedics;  Laterality: Right;  ? IR FLUORO GUIDE CV LINE RIGHT  04/07/2021  ? IR REMOVAL  TUN CV CATH W/O FL  05/17/2021  ? IR US GUIDE VASC ACCESS RIGHT  04/07/2021  ? IRRIGATION AND DEBRIDEMENT KNEE Right 04/05/2021  ? Procedure: IRRIGATION AND DEBRIDEMENT KNEE;  Surgeon: Gaynelle Arabian, MD;  [facility];  Service: Orthopedics;  Laterality: Right;  ? KNEE BURSECTOMY Right 03/13/2017  ? Procedure: RIGHT KNEE PREPATELLAR BURSECTOMY;  Surgeon: Gaynelle Arabian, MD;  [facility];  Service: Orthopedics;  Laterality: Right;  ? picc line placement Right   ? right upper arm  ? REIMPLANTATION OF TOTAL KNEE Right 12/07/2020  ? Procedure: REIMPLANTATION OF RIGHT TOTAL KNEE;  Surgeon: Gaynelle Arabian, MD;  [facility];  Service: Orthopedics;  Laterality: Right;  2 hrs  ? REPLACEMENT TOTAL HIP W/  RESURFACING IMPLANTS Bilateral 01/2001; 08/2010  ? "left; right"  ? REPLACEMENT TOTAL KNEE BILATERAL Bilateral 10/1991; 10/2006  ? "right; left"  ? REVISION TOTAL KNEE ARTHROPLASTY Right 2012  ? SPIGELIAN HERNIA Right 12/04/2015  ? Procedure: INCARCERATED SPIGELIAN HERNIA REPAIR WITH MESH;  Surgeon: Georganna Skeans, MD;  Location: [facility];  Service: General;  Laterality: Right;  ? TOTAL KNEE  ARTHROPLASTY Right 05/21/2014  ? Procedure: RIGHT KNEE ARTHROPLASTY REINPLANTATION;  Surgeon: [name], MD;  [facility];  Service: Orthopedics;  Laterality: Right;  ? ? ?Family History  ?Problem

## 2022-02-06 ENCOUNTER — Other Ambulatory Visit (HOSPITAL_COMMUNITY): Payer: Self-pay

## 2022-03-06 ENCOUNTER — Other Ambulatory Visit (HOSPITAL_COMMUNITY): Payer: Self-pay

## 2022-03-28 DIAGNOSIS — M25561 Pain in right knee: Secondary | ICD-10-CM | POA: Diagnosis not present

## 2022-04-04 ENCOUNTER — Other Ambulatory Visit (HOSPITAL_COMMUNITY): Payer: Self-pay

## 2022-04-04 DIAGNOSIS — E669 Obesity, unspecified: Secondary | ICD-10-CM | POA: Diagnosis not present

## 2022-04-04 DIAGNOSIS — E785 Hyperlipidemia, unspecified: Secondary | ICD-10-CM | POA: Diagnosis not present

## 2022-04-04 DIAGNOSIS — I10 Essential (primary) hypertension: Secondary | ICD-10-CM | POA: Diagnosis not present

## 2022-04-04 DIAGNOSIS — K7581 Nonalcoholic steatohepatitis (NASH): Secondary | ICD-10-CM | POA: Diagnosis not present

## 2022-04-04 DIAGNOSIS — E1121 Type 2 diabetes mellitus with diabetic nephropathy: Secondary | ICD-10-CM | POA: Diagnosis not present

## 2022-04-04 MED ORDER — MOUNJARO 7.5 MG/0.5ML ~~LOC~~ SOPN
7.5000 mg | PEN_INJECTOR | SUBCUTANEOUS | 5 refills | Status: AC
Start: 2022-04-04 — End: ?
  Filled 2022-04-04: qty 2, 28d supply, fill #0

## 2022-04-04 MED ORDER — MOUNJARO 10 MG/0.5ML ~~LOC~~ SOAJ
10.0000 mg | SUBCUTANEOUS | 5 refills | Status: AC
  Filled 2022-04-04: qty 2, 28d supply, fill #0

## 2022-04-05 ENCOUNTER — Other Ambulatory Visit (HOSPITAL_COMMUNITY): Payer: Self-pay

## 2022-04-11 ENCOUNTER — Ambulatory Visit (INDEPENDENT_AMBULATORY_CARE_PROVIDER_SITE_OTHER): Payer: 59 | Admitting: Infectious Disease

## 2022-04-11 ENCOUNTER — Encounter: Payer: Self-pay | Admitting: Infectious Disease

## 2022-04-11 ENCOUNTER — Other Ambulatory Visit: Payer: Self-pay

## 2022-04-11 VITALS — BP 164/90 | HR 76 | Temp 97.4°F | Ht 68.0 in | Wt 292.0 lb

## 2022-04-11 DIAGNOSIS — K219 Gastro-esophageal reflux disease without esophagitis: Secondary | ICD-10-CM

## 2022-04-11 DIAGNOSIS — M069 Rheumatoid arthritis, unspecified: Secondary | ICD-10-CM | POA: Diagnosis not present

## 2022-04-11 DIAGNOSIS — T8453XD Infection and inflammatory reaction due to internal right knee prosthesis, subsequent encounter: Secondary | ICD-10-CM | POA: Diagnosis not present

## 2022-04-11 DIAGNOSIS — I159 Secondary hypertension, unspecified: Secondary | ICD-10-CM

## 2022-04-11 DIAGNOSIS — Z1612 Extended spectrum beta lactamase (ESBL) resistance: Secondary | ICD-10-CM

## 2022-04-11 DIAGNOSIS — A280 Pasteurellosis: Secondary | ICD-10-CM

## 2022-04-11 DIAGNOSIS — T8450XD Infection and inflammatory reaction due to unspecified internal joint prosthesis, subsequent encounter: Secondary | ICD-10-CM

## 2022-04-11 DIAGNOSIS — A499 Bacterial infection, unspecified: Secondary | ICD-10-CM

## 2022-04-11 HISTORY — DX: Gastro-esophageal reflux disease without esophagitis: K21.9

## 2022-04-11 HISTORY — DX: Bacterial infection, unspecified: A49.9

## 2022-04-11 MED ORDER — LEVOFLOXACIN 750 MG PO TABS: 750.0000 mg | ORAL_TABLET | Freq: Every day | ORAL | 11 refills | Status: DC

## 2022-04-11 NOTE — Progress Notes (Signed)
Subjective:  Chief complaint: Worsening swelling in his right knee with pain patient ID: [name], male    DOB: [birth year], 61 y.o.   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  He then had prostghetic joint infection with Staphylococcus lugdunensis and (methicillin sensitive in 2017 which was treated with daptomycin along with rifampin after I&D and polyexchange.  He apparently developed a vancomycin allergy at that time.  He completed 6 weeks of therapy and then stopped.  He then returned in 2017 with knee pain and swelling and now was found again to have prosthetic joint infection was taken to the operating room but nothing grew from culture and he was treated for 6 weeks with daptomycin after I&D and polyexchange he is followed by Dr. Linus Salmons and placed on Augmentin in February 2018 and completed at least 5 months of oral therapy.  He then underwent additional I&D with pollex change in November 2018 recurrent right knee infection again with negative cultures.  Then had aspirate the knee that grew Citrobacter koseri that was fairly sensitive and underwent another I&D with polyexchange in May 2021.  He was treated with protracted oral ciprofloxacin but was having loosening of his prosthesis and was concern for infection and underwent I&D with resection arthroplasty now with an antibiotic spacer placed in September 2021.  OR cultures now yielded ampicillin sensitive Enterococcus faecalis.  He completed 6 weeks of IV Zosyn on October 22.  After that he was placed on Augmentin 875 125 twice daily until February 2.  It was then stopped and he ultimately underwent reimplantation of right total knee arthroplasty on December 07, 2020.  Cultures in the OR at the time of surgery were without growth.  He was doing well until few days prior to admission this June 2022 he was admitted by orthopedics due to concerns for recurrence of prosthetic joint infection.  He was having increased discomfort drainage from an anterior aspect of the knee.  He underwent I&D by Dr. Maureen Ralphs on June 22.  Cultures done in the  OR grew Pasteurella species.  Patient had been in the interim discharged on daptomycin and ceftriaxone prior to cultures resulting.  He was seen and followed by Dr.Manandhar and changed to continuous dose ampicillin the patient completed on followed with oral amoxicillin.   In asking him closely at last visit he told me he did  have a boxer that likes to lick his feet and also likes to jump into his lap frequently and sometimes scratches his skin.  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  I went to great lengths  several visits ago to explain that Pasteurella is a normal part of the oral flora of dogs and that the dog is not unhealthy and is in no way doing anything wrong with his behaviors though I did caution him to try to protect his skin and in particular his wounds from being scratched by his dog.  Sepsis seen in January by Dr. Maureen Ralphs who rated his knee and obtained cell count differential I do not have access to as well as a culture that grew a staphylococcal species that was sensitive "against all  antibiotics against present was tested.  Amoxicillin indeed would NOT be active vs even MSSA. He was switched to ciprofloxacin and is experiencing less pain in the knee since then.   I obtained sensitivities on the organism and it was a Staphylococcus pseudo intermedius that was sensitive to oxacillin.    I have changed the patient over to Augmentin.  He has been following along with Dr.Alusio and not had further drainage of  his knee.  Knee pain appears to have been improving.  Unfortunately in the interim he has developed worsening swelling of the knee.  He underwent sterile aspiration of the knee which revealed "regular fluid per Dr. Clide Dales note.  Unfortunately the cultures from this aspirate have yielded an extended spectrum beta-lactamase producing Klebsiella species which curiously is reported as being sensitive to levofloxacin but resistant to ciprofloxacin sensitive to Bactrim and carbapenems but otherwise resistant.        Past Medical History:  Diagnosis Date   Allergy    OCC    Anemia    hx of   Asthma    Cataract    small- forming    Cellulitis of lower extremity    "usually RLL; this time it's got up to my lower abdoment" (02/11/2014)-series of antibiotics completed 02-28-14   Diverticulosis    Edema    legs   GERD (gastroesophageal reflux disease)    Hypertension    Infection of right knee (HCC)    OSA (obstructive sleep apnea)    does not use cpap    Pasteurella infection 05/17/2021   Pneumonia    20+ years ago   PONV (postoperative nausea and vomiting)    Rheumatoid arthritis (HCC) dx'd ~ 1977   S/P PICC central line placement 03-16-14   right upper arm remains intact.05-11-14 removed 1 week ago.   Sleep apnea    no cpap     Past Surgical History:  Procedure Laterality Date   COLONOSCOPY  2012   EXCISIONAL HEMORRHOIDECTOMY     EXCISIONAL TOTAL KNEE ARTHROPLASTY WITH ANTIBIOTIC SPACERS Right 03/18/2014   Procedure: RIGHT KNEE RESECTION ARTHROPLASTY WITH ANTIBIOTIC SPACERS;  Surgeon: Loanne Drilling, MD;  Location: [facility];  Service: Orthopedics;  Laterality: Right;   EXCISIONAL TOTAL KNEE ARTHROPLASTY WITH ANTIBIOTIC SPACERS Right 06/22/2020   Procedure: Right knee resection arthroplasty, antibiotic spacer;  Surgeon: Ollen Gross, MD;  Location: [facility];  Service: Orthopedics;  Laterality: Right;    FOREIGN BODY REMOVAL Left    "BB" removal above left eye-teen yrs.    HERNIA REPAIR     I & D KNEE WITH POLY EXCHANGE Right 04/25/2016   Procedure: RIGHT KNEE IRRIGATION AND DEBRIDEMENT WITH POLY EXCHANGE;  Surgeon: Ollen Gross, MD;  Location: [facility];  Service: Orthopedics;  Laterality: Right;   I & D KNEE WITH POLY EXCHANGE Right 10/01/2016   Procedure: IRRIGATION AND DEBRIDEMENT KNEE WITH POLY EXCHANGE;  Surgeon: Ollen Gross, MD;  Location: [facility];  Service: Orthopedics;  Laterality: Right;   I & D KNEE WITH POLY EXCHANGE Right 08/26/2017   Procedure: IRRIGATION AND DEBRIDEMENT KNEE WITH POLY EXCHANGE;  Surgeon: Ollen Gross, MD;  Location: [facility];  Service: Orthopedics;  Laterality: Right;   I & D KNEE WITH POLY EXCHANGE Right 11/16/2019   Procedure: IRRIGATION AND DEBRIDEMENT KNEE WITH POLY EXCHANGE;  Surgeon: Ollen Gross, MD;  Location: [facility];  Service: Orthopedics;  Laterality: Right;    I & D KNEE WITH POLY EXCHANGE Right 02/22/2020   Procedure:  Right knee irrigation and debridement;  Surgeon: Ollen Gross, MD;  Location: [facility];  Service: Orthopedics;  Laterality: Right;    INCISION AND DRAINAGE Right 09/28/2014   Procedure: INCISION AND DRAINAGE RIGHT KNEE;  Surgeon: Loanne Drilling, MD;  Location: [facility];  Service: Orthopedics;  Laterality: Right;   IR FLUORO GUIDE CV LINE RIGHT  04/07/2021   IR REMOVAL TUN CV CATH W/O FL  05/17/2021   IR US GUIDE VASC ACCESS RIGHT  04/07/2021   IRRIGATION AND DEBRIDEMENT KNEE Right 04/05/2021   Procedure: IRRIGATION AND DEBRIDEMENT KNEE;  Surgeon: Ollen Gross, MD;  Location: [facility];  Service: Orthopedics;  Laterality: Right;   KNEE BURSECTOMY Right 03/13/2017   Procedure: RIGHT KNEE PREPATELLAR BURSECTOMY;  Surgeon: Ollen Gross, MD;  Location: [facility];  Service: Orthopedics;  Laterality: Right;   picc line placement Right    right upper arm   REIMPLANTATION OF TOTAL KNEE Right 12/07/2020   Procedure: REIMPLANTATION OF RIGHT TOTAL KNEE;  Surgeon: Ollen Gross, MD;  Location: [facility];  Service:  Orthopedics;  Laterality: Right;  2 hrs   REPLACEMENT TOTAL HIP W/  RESURFACING IMPLANTS Bilateral 01/2001; 08/2010   "left; right"   REPLACEMENT TOTAL KNEE BILATERAL Bilateral 10/1991; 10/2006   "right; left"   REVISION TOTAL KNEE ARTHROPLASTY Right 2012   SPIGELIAN HERNIA Right 12/04/2015   Procedure: INCARCERATED SPIGELIAN HERNIA REPAIR WITH MESH;  Surgeon: Violeta Gelinas, MD;  Location: [facility];  Service: General;  Laterality: Right;   TOTAL KNEE ARTHROPLASTY Right 05/21/2014   Procedure: RIGHT KNEE ARTHROPLASTY REINPLANTATION;  Surgeon: Loanne Drilling, MD;  Location: [facility];  Service: Orthopedics;  Laterality: Right;    Family History  Problem Relation Age of Onset   Colon cancer Mother 39       passed age 28 - stage 4    Hypertension Mother    Diabetes Mellitus II Paternal Grandmother    Colon polyps Neg Hx    Esophageal cancer Neg Hx    Stomach cancer Neg Hx    Rectal cancer Neg Hx       Social History   Socioeconomic History   Marital status: Married    Spouse name: Not on file   Number of children: 1   Years of education: 14   Highest education level: Not on file  Occupational History   Not on file  Tobacco Use   Smoking status: Former    Packs/day: 1.00    Years: 3.00    Total pack years: 3.00    Types: Cigarettes    Quit date: 03/16/1981    Years since quitting: 41.0   Smokeless tobacco: Current    Types: Snuff   Tobacco comments:    "quit smoking cigarettes in the 1980's"  Vaping Use   Vaping Use: Never used  Substance and Sexual Activity   Alcohol use: No   Drug use: No   Sexual activity: Yes    Partners: Male  Other Topics Concern   Not on file  Social History Narrative   Not on file   Social Determinants of Health   Financial Resource Strain: Not on file  Food Insecurity: Not on file  Transportation Needs: Not on file  Physical Activity: Not on file  Stress: Not on file  Social Connections: Not on file    Allergies  Allergen Reactions    Avelox [Moxifloxacin Hcl In Nacl] Hives, Shortness Of Breath and Itching    Tolerates Cipro   Chlorhexidine  Skin red and burning after skin wash   Doxycycline Itching   Sulfa Antibiotics Itching   Vancomycin Hives and Itching   E.E.S. [Erythromycin] Rash   Erythrocin Rash   Sulfasalazine Rash     Current Outpatient Medications:    amoxicillin-clavulanate (AUGMENTIN) 875-125 MG tablet, Take 1 tablet by mouth 2 (two) times daily., Disp: 60 tablet, Rfl: 11   cholecalciferol (VITAMIN D3) 25 MCG (1000 UNIT) tablet, Take 1,000 Units by mouth daily., Disp: , Rfl:    diltiazem (CARDIZEM CD) 180 MG 24 hr capsule, Take 1 capsule (180 mg total) by mouth daily., Disp: 30 capsule, Rfl: 2   diltiazem (TIAZAC) 180 MG 24 hr capsule, , Disp: , Rfl:    Fluticasone-Salmeterol (ADVAIR) 250-50 MCG/DOSE AEPB, Inhale 1 puff into the lungs daily. (Patient not taking: Reported on 11/27/2021), Disp: , Rfl:    Ipratropium-Albuterol (COMBIVENT RESPIMAT) 20-100 MCG/ACT AERS respimat, Inhale 1 puff into the lungs every 6 (six) hours as needed for wheezing or shortness of breath., Disp: , Rfl:    losartan (COZAAR) 100 MG tablet, Take 100 mg by mouth daily. , Disp: , Rfl:    methocarbamol (ROBAXIN) 500 MG tablet, Take 1 tablet (500 mg total) by mouth every 6 (six) hours as needed for muscle spasms. (Patient not taking: Reported on 11/27/2021), Disp: 40 tablet, Rfl: 0   methotrexate (RHEUMATREX) 2.5 MG tablet, Take 10 mg by mouth every Monday. Caution:Chemotherapy. Protect from light., Disp: , Rfl:    MOUNJARO 5 MG/0.5ML Pen, SMARTSIG:5 Milligram(s) SUB-Q Once a Week (Patient not taking: Reported on 11/27/2021), Disp: , Rfl:    Multiple Vitamin (MULTIVITAMIN WITH MINERALS) TABS tablet, Take 1 tablet by mouth daily., Disp: , Rfl:    nystatin (MYCOSTATIN/NYSTOP) powder, Apply 1 application topically 3 (three) times daily., Disp: 30 g, Rfl: 5   oxyCODONE (OXY IR/ROXICODONE) 5 MG immediate release tablet, Take 1-2 tablets  (5-10 mg total) by mouth every 4 (four) hours as needed for severe pain. (Patient not taking: Reported on 06/16/2021), Disp: 30 tablet, Rfl: 0   RABEprazole (ACIPHEX) 20 MG tablet, Take 20 mg by mouth daily before breakfast. , Disp: , Rfl:    RYBELSUS 7 MG TABS, Take 7 mg by mouth every morning. (Patient not taking: Reported on 11/27/2021), Disp: , Rfl:    tirzepatide (MOUNJARO) 10 MG/0.5ML Pen, Mounjaro 10 mg/0.5 mL subcutaneous pen injector  INJECT 10MG  SUBCUTANEOUS ONCE A WEEK 30 DAYS, Disp: , Rfl:    tirzepatide (MOUNJARO) 10 MG/0.5ML Pen, Inject 10 mg into the skin once a week., Disp: 2 mL, Rfl: 5   tirzepatide (MOUNJARO) 12.5 MG/0.5ML Pen, Inject 12.5 mg into the skin once a week., Disp: 2 mL, Rfl: 6   tirzepatide (MOUNJARO) 7.5 MG/0.5ML Pen, Inject 7.5 mg into the skin once a week., Disp: 2 mL, Rfl: 5   traMADol (ULTRAM) 50 MG tablet, Take 1-2 tablets (50-100 mg total) by mouth every 6 (six) hours as needed for moderate pain. (Patient not taking: Reported on 11/27/2021), Disp: 40 tablet, Rfl: 0   triamterene-hydrochlorothiazide (MAXZIDE-25) 37.5-25 MG tablet, Take 1 tablet by mouth daily., Disp: , Rfl:    Review of Systems  Constitutional:  Negative for activity change, appetite change, chills, diaphoresis, fatigue, fever and unexpected weight change.  HENT:  Negative for congestion, rhinorrhea, sinus pressure, sneezing, sore throat and trouble swallowing.   Eyes:  Negative for photophobia and visual disturbance.  Respiratory:  Negative for cough, chest tightness, shortness of breath, wheezing and stridor.   Cardiovascular:  Negative for chest pain, palpitations and leg swelling.  Gastrointestinal:  Negative for abdominal distention, abdominal pain, anal bleeding, blood in stool, constipation, diarrhea, nausea and vomiting.  Genitourinary:  Negative for difficulty urinating, dysuria, flank pain and hematuria.  Musculoskeletal:  Positive for joint swelling and myalgias. Negative for arthralgias,  back pain and gait problem.  Skin:  Negative for color change, pallor, rash and wound.  Neurological:  Negative for dizziness, tremors, weakness and light-headedness.  Hematological:  Negative for adenopathy. Does not bruise/bleed easily.  Psychiatric/Behavioral:  Negative for agitation, behavioral problems, confusion, decreased concentration, dysphoric mood and sleep disturbance.        Objective:   Physical Exam Constitutional:      Appearance: He is well-developed.  HENT:     Head: Normocephalic and atraumatic.  Eyes:     Conjunctiva/sclera: Conjunctivae normal.  Cardiovascular:     Rate and Rhythm: Normal rate and regular rhythm.  Pulmonary:     Effort: Pulmonary effort is normal. No respiratory distress.     Breath sounds: No wheezing.  Abdominal:     General: There is no distension.     Palpations: Abdomen is soft.  Musculoskeletal:        General: No tenderness. Normal range of motion.     Cervical back: Normal range of motion and neck supple.  Skin:    General: Skin is warm and dry.     Coloration: Skin is not pale.     Findings: No erythema or rash.  Neurological:     General: No focal deficit present.     Mental Status: He is alert and oriented to person, place, and time.  Psychiatric:        Mood and Affect: Mood normal.        Behavior: Behavior normal.        Thought Content: Thought content normal.        Judgment: Judgment normal.     Right knee 05/17/2021:        Knee 09/18/2021:    Knee is tender bluejeans but certainly swollen    Assessment & Plan:  Prosthetic joint infection: Recent organisms that have been isolated include Pasteurella staphylococcal species that was oxacillin sensitive and now ESBL Klebsiella resistant to everything except for carbapenems Bactrim, reportedly resistant to ciprofloxacin but sensitive that levofloxacin I have reviewed this case with Alfonse Spruce our ID pharmacist in clinic and she did find one article about a  specific amino acid mutation and Klebsiella and E. coli that can lead to ciprofloxacin resistant but not levofloxacin resistance.  For the moment I do not feel I have many good options other than try levofloxacin with him given his allergy to sulfa.  There are no chronic carbapenem's that can be given orally at this point.  I think he ultimately is going to need to have a fusion or above-the-knee amputation.  I will recheck a sed rate CRP BMP and CBC with differential keeping in mind the RA is another potential confounder   Rheumatoid arthritis on methotrexate   Hypertension:  He is continue losartan hydrochlorothiazide and triamterene   GERD: He is on a PPI but says he was last on as a preventative measure.  Of asked him to try to come off of it and use Pepcid if needed Tums or even mustard to treat rebound heartburn.  I am trying to attenuate risk of C. difficile colitis which she now is going to be taking on the fluoroquinolone  I  spent 37 minutes with the patient including than 50% of the time in face to face counseling of the patient regarding the nature of chronic recurrent prosthetic joint infections in particular ones that become infected with multidrug-resistant organism such as ESBL the limited treatment options we have and likely need for fusion or above-the-knee amputation to cure this infection,  along with review of medical records in preparation for the visit and during the visit and in coordination ofhis care.

## 2022-04-12 LAB — SEDIMENTATION RATE: Sed Rate: 45 mm/h — ABNORMAL HIGH (ref 0–20)

## 2022-04-12 LAB — CBC WITH DIFFERENTIAL/PLATELET
Absolute Monocytes: 196 cells/uL — ABNORMAL LOW (ref 200–950)
Basophils Absolute: 29 cells/uL (ref 0–200)
Basophils Relative: 0.6 %
Eosinophils Absolute: 397 cells/uL (ref 15–500)
Eosinophils Relative: 8.1 %
HCT: 36.7 % — ABNORMAL LOW (ref 38.5–50.0)
Hemoglobin: 11.8 g/dL — ABNORMAL LOW (ref 13.2–17.1)
Lymphs Abs: 1000 cells/uL (ref 850–3900)
MCH: 27.6 pg (ref 27.0–33.0)
MCHC: 32.2 g/dL (ref 32.0–36.0)
MCV: 85.7 fL (ref 80.0–100.0)
MPV: 9.6 fL (ref 7.5–12.5)
Monocytes Relative: 4 %
Neutro Abs: 3278 cells/uL (ref 1500–7800)
Neutrophils Relative %: 66.9 %
Platelets: 334 10*3/uL (ref 140–400)
RBC: 4.28 10*6/uL (ref 4.20–5.80)
RDW: 15.8 % — ABNORMAL HIGH (ref 11.0–15.0)
Total Lymphocyte: 20.4 %
WBC: 4.9 10*3/uL (ref 3.8–10.8)

## 2022-04-12 LAB — BASIC METABOLIC PANEL
BUN: 14 mg/dL (ref 7–25)
CO2: 27 mmol/L (ref 20–32)
Calcium: 8.9 mg/dL (ref 8.6–10.3)
Chloride: 106 mmol/L (ref 98–110)
Creat: 0.91 mg/dL (ref 0.70–1.35)
Glucose, Bld: 92 mg/dL (ref 65–99)
Potassium: 4.4 mmol/L (ref 3.5–5.3)
Sodium: 139 mmol/L (ref 135–146)

## 2022-04-12 LAB — C-REACTIVE PROTEIN: CRP: 35.4 mg/L — ABNORMAL HIGH (ref <8.0)

## 2022-05-02 ENCOUNTER — Other Ambulatory Visit (HOSPITAL_COMMUNITY): Payer: Self-pay

## 2022-05-04 ENCOUNTER — Other Ambulatory Visit (HOSPITAL_COMMUNITY): Payer: Self-pay

## 2022-05-10 DIAGNOSIS — M25561 Pain in right knee: Secondary | ICD-10-CM | POA: Diagnosis not present

## 2022-05-16 ENCOUNTER — Other Ambulatory Visit (HOSPITAL_COMMUNITY): Payer: Self-pay

## 2022-05-30 ENCOUNTER — Ambulatory Visit (INDEPENDENT_AMBULATORY_CARE_PROVIDER_SITE_OTHER): Payer: 59 | Admitting: Infectious Disease

## 2022-05-30 ENCOUNTER — Other Ambulatory Visit: Payer: Self-pay

## 2022-05-30 ENCOUNTER — Encounter: Payer: Self-pay | Admitting: Infectious Disease

## 2022-05-30 VITALS — BP 143/85 | HR 74 | Resp 16 | Ht 68.0 in | Wt 284.0 lb

## 2022-05-30 DIAGNOSIS — Z87891 Personal history of nicotine dependence: Secondary | ICD-10-CM

## 2022-05-30 DIAGNOSIS — T8453XD Infection and inflammatory reaction due to internal right knee prosthesis, subsequent encounter: Secondary | ICD-10-CM | POA: Diagnosis not present

## 2022-05-30 DIAGNOSIS — I10 Essential (primary) hypertension: Secondary | ICD-10-CM | POA: Diagnosis not present

## 2022-05-30 DIAGNOSIS — A280 Pasteurellosis: Secondary | ICD-10-CM

## 2022-05-30 DIAGNOSIS — A499 Bacterial infection, unspecified: Secondary | ICD-10-CM

## 2022-05-30 DIAGNOSIS — Z1612 Extended spectrum beta lactamase (ESBL) resistance: Secondary | ICD-10-CM | POA: Diagnosis not present

## 2022-05-30 DIAGNOSIS — M069 Rheumatoid arthritis, unspecified: Secondary | ICD-10-CM | POA: Diagnosis not present

## 2022-05-30 MED ORDER — LEVOFLOXACIN 750 MG PO TABS
750.0000 mg | ORAL_TABLET | Freq: Every day | ORAL | 11 refills | Status: DC
Start: 2022-05-30 — End: 2022-08-29

## 2022-05-30 NOTE — Progress Notes (Signed)
Subjective:  Chief complaint: Continued swelling in the knee he has had a repeat aspiration on the by Dr. Maureen Ralphs   patient ID: [name], male    DOB: 06-10-1961, 61 y.o.   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  He then had prostghetic joint infection with Staphylococcus lugdunensis and (methicillin sensitive in 2017 which was treated with daptomycin along with rifampin after I&D and polyexchange.  He apparently developed a vancomycin allergy at that time.  He completed 6 weeks of therapy and then stopped.  He then returned in 2017 with knee pain and swelling and now was found again to have prosthetic joint infection was taken to the operating room but nothing grew from culture and he was treated for 6 weeks with daptomycin after I&D and polyexchange he is followed by Dr. Linus Salmons and placed on Augmentin in February 2018 and completed at least 5 months of oral therapy.  He then underwent additional I&D with pollex change in November 2018 recurrent right knee infection again with negative cultures.  Then had aspirate the knee that grew Citrobacter koseri that was fairly sensitive and underwent another I&D with polyexchange in May 2021.  He was treated with protracted oral ciprofloxacin but was having loosening of his prosthesis and was concern for infection and underwent I&D with resection arthroplasty now with an antibiotic spacer placed in September 2021.  OR cultures now yielded ampicillin sensitive Enterococcus faecalis.  He completed 6 weeks of IV Zosyn on October 22.  After that he was placed on Augmentin 875 125 twice daily until February 2.  It was then stopped and he ultimately underwent reimplantation of right total  knee arthroplasty on December 07, 2020.  Cultures in the OR at the time of surgery were without growth.  He was doing well until few days prior to admission this June 2022 he was admitted by orthopedics due to concerns for recurrence of prosthetic joint infection.  He was having increased discomfort drainage from an anterior aspect of the knee.  He underwent I&D by Dr. Maureen Ralphs on June 22.  Cultures done in the  OR grew Pasteurella species.  Patient had been in the interim discharged on daptomycin and ceftriaxone prior to cultures resulting.  He was seen and followed by Dr.Manandhar and changed to continuous dose ampicillin the patient completed on followed with oral amoxicillin.   In asking him closely at last visit he told me he did  have a boxer that likes to lick his feet and also likes to jump into his lap frequently and sometimes scratches his skin.  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  I went to great lengths  several visits ago to explain that Pasteurella is a normal part of the oral flora of dogs and that the dog is not unhealthy and is in no way doing anything wrong with his behaviors though I did caution him to try to protect his skin and in particular his wounds from being scratched by his dog.  Sepsis seen in January by Dr. Maureen Ralphs who rated his knee and obtained cell count differential I do not have access to as well as a culture  that grew a staphylococcal species that was sensitive "against all antibiotics against present was tested.  Amoxicillin indeed would NOT be active vs even MSSA. He was switched to ciprofloxacin and is experiencing less pain in the knee since then.   I obtained sensitivities on the organism and it was a Staphylococcus pseudo intermedius that was sensitive to oxacillin.    I have changed the patient over to Augmentin.  He has been following along with  Dr.Alusio and not had further drainage of his knee.  Knee pain appears to have been improving.  Unfortunately in the interim he has developed worsening swelling of the knee.  He underwent sterile aspiration of the knee which revealed "regular fluid per Dr. Lucio's note.  Unfortunately the cultures from this aspirate have yielded an extended spectrum beta-lactamase producing Klebsiella species which curiously is reported as being sensitive to levofloxacin but resistant to ciprofloxacin sensitive to Bactrim and carbapenems but otherwise resistant.     He has been on levofloxacin and tolerated this.  Since I last saw him he had aspirate of the knee performed and this showed 13,806 white blood cells and 97% neutrophils which is disconcerting.  Cultures did not yield an organism.        Knee pain and swelling is relatively stable.    Past Medical History:  Diagnosis Date   Allergy    OCC    Anemia    hx of   Asthma    Cataract    small- forming    Cellulitis of lower extremity    "usually RLL; this time it's got up to my lower abdoment" (02/11/2014)-series of antibiotics completed 02-28-14   Diverticulosis    Edema    legs   ESBL (extended spectrum beta-lactamase) producing bacteria infection 04/11/2022   GERD (gastroesophageal reflux disease)    GERD (gastroesophageal reflux disease) 04/11/2022   Hypertension    Infection of right knee (HCC)    OSA (obstructive sleep apnea)    does not use cpap    Pasteurella infection 05/17/2021   Pneumonia    20+ years ago   PONV (postoperative nausea and vomiting)    Rheumatoid arthritis (HCC) dx'd ~ 1977   S/P PICC central line placement 03-16-14   right upper arm remains intact.05-11-14 removed 1 week ago.   Sleep apnea    no cpap     Past Surgical History:  Procedure Laterality Date   COLONOSCOPY  2012   EXCISIONAL HEMORRHOIDECTOMY     EXCISIONAL TOTAL KNEE ARTHROPLASTY WITH ANTIBIOTIC SPACERS Right 03/18/2014   Procedure: RIGHT  KNEE RESECTION ARTHROPLASTY WITH ANTIBIOTIC SPACERS;  Surgeon: Frank Aluisio V, MD;  Location: [facility];  Service: Orthopedics;  Laterality: Right;   EXCISIONAL TOTAL KNEE ARTHROPLASTY WITH ANTIBIOTIC SPACERS Right 06/22/2020   Procedure: Right knee resection arthroplasty, antibiotic spacer;  Surgeon: Aluisio, Frank, MD;  Location: [facility];  Service: Orthopedics;  Laterality: Right;  PaAscension Providence Hospitalc16109gAmerican International GMcGraw-[phone] yRPaCardiovascular Surgical Suites LLCc16109gAmerican International GMcGraw-[phone] yRPaNorwood Endoscopy Center LLCc16109gAmerican International GMcGraw-[phone] yPaDesoto Eye Surgery Center LLCc16109gAmerican International GMcGraw-Hil41.3roC684E502 [phone] yRPaSt Joseph'S Children'S Homec16109gAmerican International GMcGraw-HillOur L41.3dy [phone]<MEASUREMEPaProvidence Little Company Of Mary Mc - Torrancec161McGraw-Hill0G41. nvPaPerham Healthc16109gAmerican International GMcGraw-HillroAlta Bates [phone] yRPaMedical Arts Hospitalc16109gAmerican International GMcGraw-[phone] yRPaElite Endoscopy LLCc16109gAmerican International GMcGraw-HillKindred Hospital-[phone] yRPaSioux Center Healthc16109gAmerican International GMcGraw-HillroUnit41.3d [phone] yRPaLower Keys Medical Centerc16109gAmerican International GMcGraw-HillrouNortheast Montana [phone] yRPaEmory University Hospital Midtownc16109gAmerican International GMcGraw-[phone]<MEASUREMEPaKaiser Foundation Hospital - [facility] Claire[facility] Mesac161McGraw-Hill0[place] Coun41. RePaHospital For Special Carec16109gAmerican International GMcGraw-HillrouBrainerd [phone] HissPaoY REMOVAL Left    "BB" removal above left eye-teen yrs.   HERNIA REPAIR     I & D KNEE WITH POLY EXCHANGE Right 04/25/2016   Procedure: RIGHT KNEE IRRIGATION AND DEBRIDEMENT WITH POLY EXCHANGE;  Surgeon: Frank Aluisio, MD;  [facility];  Service: Orthopedics;  Laterality: Right;   I & D KNEE WITH POLY EXCHANGE Right 10/01/2016   Procedure: IRRIGATION AND DEBRIDEMENT KNEE WITH POLY EXCHANGE;  Surgeon: Frank Aluisio, MD;  Location[facility];  Service: Orthopedics;  Laterality: Right;   I & D KNEE  WITH POLY EXCHANGE Right 08/26/2017   Procedure: IRRIGATION AND DEBRIDEMENT KNEE WITH POLY EXCHANGE;  Surgeon: Ollen Gross, MD;  Location: [facility];  Service: Orthopedics;  Laterality: Right;   I & D KNEE WITH POLY EXCHANGE Right 11/16/2019   Procedure: IRRIGATION AND DEBRIDEMENT KNEE WITH POLY EXCHANGE;  Surgeon: Ollen Gross, MD;  [facility]: [facility];  Service: Orthopedics;  Laterality: Right;    I & D KNEE WITH POLY EXCHANGE Right 02/22/2020   Procedure: Right knee irrigation and debridement;  Surgeon: Ollen Gross, MD;  Location: [facility];  Service: Orthopedics;  Laterality: Right;    INCISION AND DRAINAGE Right 09/28/2014   Procedure: INCISION AND DRAINAGE RIGHT KNEE;  Surgeon: Loanne Drilling, MD;  Location: [facility];  Service: Orthopedics;  Laterality: Right;   IR FLUORO GUIDE CV LINE RIGHT  04/07/2021   IR REMOVAL TUN CV CATH W/O FL  05/17/2021   IR US GUIDE VASC ACCESS RIGHT  04/07/2021   IRRIGATION AND DEBRIDEMENT KNEE Right 04/05/2021   Procedure: IRRIGATION AND DEBRIDEMENT KNEE;  Surgeon:  Ollen Gross, MD;  Location: [facility];  Service: Orthopedics;  Laterality: Right;   KNEE BURSECTOMY Right 03/13/2017   Procedure: RIGHT KNEE PREPATELLAR BURSECTOMY;  Surgeon: Ollen Gross, MD;  Location: [facility];  Service: Orthopedics;  Laterality: Right;   picc line placement Right    right upper arm   REIMPLANTATION OF TOTAL KNEE Right 12/07/2020   Procedure: REIMPLANTATION OF RIGHT TOTAL KNEE;  Surgeon: Ollen Gross, MD;  Location: [facility];  Service: Orthopedics;  Laterality: Right;  2 hrs   REPLACEMENT TOTAL HIP W/  RESURFACING IMPLANTS Bilateral 01/2001; 08/2010   "left; right"   REPLACEMENT TOTAL KNEE BILATERAL Bilateral 10/1991; 10/2006   "right; left"   REVISION TOTAL KNEE ARTHROPLASTY Right 2012   SPIGELIAN HERNIA Right 12/04/2015   Procedure: INCARCERATED SPIGELIAN HERNIA REPAIR WITH MESH;  Surgeon: Violeta Gelinas, MD;  [facility];  Service: General;  Laterality: Right;   TOTAL KNEE ARTHROPLASTY Right 05/21/2014   Procedure: RIGHT KNEE ARTHROPLASTY REINPLANTATION;  Surgeon: Loanne Drilling, MD;  Location: [facility];  Service: Orthopedics;  Laterality: Right;    Family History  Problem Relation Age of Onset   Colon cancer Mother 48       passed age 20 - stage 4    Hypertension Mother    Diabetes Mellitus II Paternal Grandmother    Colon polyps Neg Hx    Esophageal cancer Neg Hx    Stomach cancer Neg Hx    Rectal cancer Neg Hx       Social History   Socioeconomic History   Marital status: Married    Spouse name: Not on file   Number of children: 1   Years of education: 14   Highest education level: Not on file  Occupational History   Not on file  Tobacco Use   Smoking status: Former    Packs/day: 1.00    Years: 3.00    Total pack years: 3.00    Types: Cigarettes    Quit date: 03/16/1981    Years since quitting: 41.2   Smokeless tobacco: Current    Types: Snuff   Tobacco comments:    "quit smoking cigarettes in the 1980's"  Vaping Use   Vaping Use: Never used   Substance and Sexual Activity   Alcohol use: No   Drug use: No   Sexual activity: Yes    Partners: Male  Other Topics Concern   Not on  file  Social History Narrative   Not on file   Social Determinants of Health   Financial Resource Strain: Not on file  Food Insecurity: Not on file  Transportation Needs: Not on file  Physical Activity: Not on file  Stress: Not on file  Social Connections: Not on file    Allergies  Allergen Reactions   Avelox [Moxifloxacin Hcl In Nacl] Hives, Shortness Of Breath and Itching    Tolerates Cipro   Chlorhexidine     Skin red and burning after skin wash   Doxycycline Itching   Sulfa Antibiotics Itching   Vancomycin Hives and Itching   E.E.S. [Erythromycin] Rash   Erythrocin Rash   Sulfasalazine Rash     Current Outpatient Medications:    cholecalciferol (VITAMIN D3) 25 MCG (1000 UNIT) tablet, Take 1,000 Units by mouth daily., Disp: , Rfl:    diltiazem (CARDIZEM CD) 180 MG 24 hr capsule, Take 1 capsule (180 mg total) by mouth daily., Disp: 30 capsule, Rfl: 2   diltiazem (TIAZAC) 180 MG 24 hr capsule, , Disp: , Rfl:    Fluticasone-Salmeterol (ADVAIR) 250-50 MCG/DOSE AEPB, Inhale 1 puff into the lungs daily., Disp: , Rfl:    Ipratropium-Albuterol (COMBIVENT RESPIMAT) 20-100 MCG/ACT AERS respimat, Inhale 1 puff into the lungs every 6 (six) hours as needed for wheezing or shortness of breath., Disp: , Rfl:    levofloxacin (LEVAQUIN) 750 MG tablet, Take 1 tablet by mouth daily., Disp: , Rfl:    losartan (COZAAR) 100 MG tablet, Take 100 mg by mouth daily. , Disp: , Rfl:    methocarbamol (ROBAXIN) 500 MG tablet, Take 1 tablet (500 mg total) by mouth every 6 (six) hours as needed for muscle spasms., Disp: 40 tablet, Rfl: 0   methotrexate (RHEUMATREX) 2.5 MG tablet, Take 10 mg by mouth every Monday. Caution:Chemotherapy. Protect from light., Disp: , Rfl:    MOUNJARO 5 MG/0.5ML Pen, , Disp: , Rfl:    Multiple Vitamin (MULTIVITAMIN WITH MINERALS) TABS  tablet, Take 1 tablet by mouth daily., Disp: , Rfl:    nystatin (MYCOSTATIN/NYSTOP) powder, Apply 1 application topically 3 (three) times daily., Disp: 30 g, Rfl: 5   RABEprazole (ACIPHEX) 20 MG tablet, Take 20 mg by mouth daily before breakfast. , Disp: , Rfl:    RYBELSUS 7 MG TABS, Take 7 mg by mouth every morning., Disp: , Rfl:    tirzepatide (MOUNJARO) 10 MG/0.5ML Pen, Mounjaro 10 mg/0.5 mL subcutaneous pen injector  INJECT 10MG  SUBCUTANEOUS ONCE A WEEK 30 DAYS, Disp: , Rfl:    tirzepatide (MOUNJARO) 10 MG/0.5ML Pen, Inject 10 mg into the skin once a week., Disp: 2 mL, Rfl: 5   tirzepatide (MOUNJARO) 12.5 MG/0.5ML Pen, Inject 12.5 mg into the skin once a week., Disp: 2 mL, Rfl: 6   tirzepatide (MOUNJARO) 7.5 MG/0.5ML Pen, Inject 7.5 mg into the skin once a week., Disp: 2 mL, Rfl: 5   traMADol (ULTRAM) 50 MG tablet, Take 1-2 tablets (50-100 mg total) by mouth every 6 (six) hours as needed for moderate pain., Disp: 40 tablet, Rfl: 0   triamterene-hydrochlorothiazide (MAXZIDE-25) 37.5-25 MG tablet, Take 1 tablet by mouth daily., Disp: , Rfl:    oxyCODONE (OXY IR/ROXICODONE) 5 MG immediate release tablet, Take 1-2 tablets (5-10 mg total) by mouth every 4 (four) hours as needed for severe pain. (Patient not taking: Reported on 05/30/2022), Disp: 30 tablet, Rfl: 0   Review of Systems  Constitutional:  Negative for activity change, appetite change, chills, diaphoresis, fatigue, fever and  unexpected weight change.  HENT:  Negative for congestion, rhinorrhea, sinus pressure, sneezing, sore throat and trouble swallowing.   Eyes:  Negative for photophobia and visual disturbance.  Respiratory:  Negative for cough, chest tightness, shortness of breath, wheezing and stridor.   Cardiovascular:  Negative for chest pain, palpitations and leg swelling.  Gastrointestinal:  Negative for abdominal distention, abdominal pain, anal bleeding, blood in stool, constipation, diarrhea, nausea and vomiting.   Genitourinary:  Negative for difficulty urinating, dysuria, flank pain and hematuria.  Musculoskeletal:  Positive for arthralgias and joint swelling. Negative for back pain, gait problem and myalgias.  Skin:  Negative for color change, pallor, rash and wound.  Neurological:  Negative for dizziness, tremors, weakness and light-headedness.  Hematological:  Negative for adenopathy. Does not bruise/bleed easily.  Psychiatric/Behavioral:  Negative for agitation, behavioral problems, confusion, decreased concentration, dysphoric mood and sleep disturbance.        Objective:   Physical Exam Constitutional:      Appearance: He is well-developed.  HENT:     Head: Normocephalic and atraumatic.  Eyes:     Conjunctiva/sclera: Conjunctivae normal.  Cardiovascular:     Rate and Rhythm: Normal rate and regular rhythm.  Pulmonary:     Effort: Pulmonary effort is normal. No respiratory distress.     Breath sounds: No wheezing.  Abdominal:     General: There is no distension.     Palpations: Abdomen is soft.  Musculoskeletal:        General: Swelling present.     Cervical back: Normal range of motion and neck supple.  Skin:    General: Skin is warm and dry.     Coloration: Skin is not pale.     Findings: No erythema or rash.  Neurological:     General: No focal deficit present.     Mental Status: He is alert and oriented to person, place, and time.  Psychiatric:        Mood and Affect: Mood normal.        Behavior: Behavior normal.        Thought Content: Thought content normal.        Judgment: Judgment normal.     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 coli that can lead to ciprofloxacin resistant but not levofloxacin resistance.  Recent cell count is not encouraging. Fortunately no new organism isolated on culture  Thank unfortunately he is going to need a fusion or above-the-knee amputation ultimately but I will check recent Pete sed rate CRP CMP with GFR and CBC with differential.  I will continue levofloxacin indefinitely for now.      I will recheck a sed rate CRP BMP and CBC with differential keeping in mind the RA is another potential confounder   Rheumatoid arthritis on methotrexate  Hypertension:  He is continue losartan hydrochlorothiazide and triamterene

## 2022-05-31 LAB — COMPLETE METABOLIC PANEL WITH GFR
AG Ratio: 1.1 (calc) (ref 1.0–2.5)
ALT: 12 U/L (ref 9–46)
AST: 21 U/L (ref 10–35)
Albumin: 3.4 g/dL — ABNORMAL LOW (ref 3.6–5.1)
Alkaline phosphatase (APISO): 68 U/L (ref 35–144)
BUN: 14 mg/dL (ref 7–25)
CO2: 28 mmol/L (ref 20–32)
Calcium: 9.5 mg/dL (ref 8.6–10.3)
Chloride: 105 mmol/L (ref 98–110)
Creat: 1.09 mg/dL (ref 0.70–1.35)
Globulin: 3 g/dL (calc) (ref 1.9–3.7)
Glucose, Bld: 88 mg/dL (ref 65–99)
Potassium: 4.5 mmol/L (ref 3.5–5.3)
Sodium: 140 mmol/L (ref 135–146)
Total Bilirubin: 0.7 mg/dL (ref 0.2–1.2)
Total Protein: 6.4 g/dL (ref 6.1–8.1)
eGFR: 77 mL/min/{1.73_m2} (ref >=60)

## 2022-05-31 LAB — CBC WITH DIFFERENTIAL/PLATELET
Absolute Monocytes: 130 cells/uL — ABNORMAL LOW (ref 200–950)
Basophils Absolute: 37 cells/uL (ref 0–200)
Basophils Relative: 0.6 %
Eosinophils Absolute: 279 cells/uL (ref 15–500)
Eosinophils Relative: 4.5 %
HCT: 41 % (ref 38.5–50.0)
Hemoglobin: 13.4 g/dL (ref 13.2–17.1)
Lymphs Abs: 856 cells/uL (ref 850–3900)
MCH: 28.6 pg (ref 27.0–33.0)
MCHC: 32.7 g/dL (ref 32.0–36.0)
MCV: 87.6 fL (ref 80.0–100.0)
MPV: 9.5 fL (ref 7.5–12.5)
Monocytes Relative: 2.1 %
Neutro Abs: 4898 cells/uL (ref 1500–7800)
Neutrophils Relative %: 79 %
Platelets: 300 10*3/uL (ref 140–400)
RBC: 4.68 10*6/uL (ref 4.20–5.80)
RDW: 15.4 % — ABNORMAL HIGH (ref 11.0–15.0)
Total Lymphocyte: 13.8 %
WBC: 6.2 10*3/uL (ref 3.8–10.8)

## 2022-05-31 LAB — SEDIMENTATION RATE: Sed Rate: 38 mm/h — ABNORMAL HIGH (ref 0–20)

## 2022-05-31 LAB — C-REACTIVE PROTEIN: CRP: 45 mg/L — ABNORMAL HIGH (ref <8.0)

## 2022-06-04 DIAGNOSIS — Z6841 Body Mass Index (BMI) 40.0 and over, adult: Secondary | ICD-10-CM | POA: Diagnosis not present

## 2022-06-04 DIAGNOSIS — M1991 Primary osteoarthritis, unspecified site: Secondary | ICD-10-CM | POA: Diagnosis not present

## 2022-06-04 DIAGNOSIS — Z79899 Other long term (current) drug therapy: Secondary | ICD-10-CM | POA: Diagnosis not present

## 2022-06-04 DIAGNOSIS — M0579 Rheumatoid arthritis with rheumatoid factor of multiple sites without organ or systems involvement: Secondary | ICD-10-CM | POA: Diagnosis not present

## 2022-06-04 DIAGNOSIS — M009 Pyogenic arthritis, unspecified: Secondary | ICD-10-CM | POA: Diagnosis not present

## 2022-06-22 DIAGNOSIS — M25561 Pain in right knee: Secondary | ICD-10-CM | POA: Diagnosis not present

## 2022-07-17 ENCOUNTER — Telehealth: Payer: Self-pay

## 2022-07-17 NOTE — Telephone Encounter (Signed)
Received restrictions questionnaire needing to be completed by provider from [place]. Forms placed in provider's box.   Beryle Flock, RN

## 2022-07-26 ENCOUNTER — Telehealth: Payer: Self-pay

## 2022-07-26 NOTE — Telephone Encounter (Signed)
Received fax from [place] request for missing items. Will place missing questionnaire in providers box to complete and fax back. Leatrice Jewels, RMA

## 2022-08-29 ENCOUNTER — Ambulatory Visit (INDEPENDENT_AMBULATORY_CARE_PROVIDER_SITE_OTHER): Payer: 59 | Admitting: Infectious Disease

## 2022-08-29 ENCOUNTER — Encounter: Payer: Self-pay | Admitting: Infectious Disease

## 2022-08-29 ENCOUNTER — Other Ambulatory Visit: Payer: Self-pay

## 2022-08-29 VITALS — BP 165/83 | HR 78 | Temp 97.6°F | Ht 68.0 in | Wt 290.0 lb

## 2022-08-29 DIAGNOSIS — A499 Bacterial infection, unspecified: Secondary | ICD-10-CM

## 2022-08-29 DIAGNOSIS — M069 Rheumatoid arthritis, unspecified: Secondary | ICD-10-CM | POA: Diagnosis not present

## 2022-08-29 DIAGNOSIS — Z1612 Extended spectrum beta lactamase (ESBL) resistance: Secondary | ICD-10-CM

## 2022-08-29 DIAGNOSIS — A280 Pasteurellosis: Secondary | ICD-10-CM

## 2022-08-29 DIAGNOSIS — T8453XD Infection and inflammatory reaction due to internal right knee prosthesis, subsequent encounter: Secondary | ICD-10-CM

## 2022-08-29 DIAGNOSIS — T8450XD Infection and inflammatory reaction due to unspecified internal joint prosthesis, subsequent encounter: Secondary | ICD-10-CM

## 2022-08-29 MED ORDER — LEVOFLOXACIN 750 MG PO TABS: 750.0000 mg | ORAL_TABLET | Freq: Every day | ORAL | 11 refills | Status: DC

## 2022-08-29 NOTE — Progress Notes (Signed)
Subjective:  Chief complaint: followup for PJI   patient ID: [name], male    DOB: 06-29-1961, 61 y.o.   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  He then had prostghetic joint infection with Staphylococcus lugdunensis and (methicillin sensitive in 2017 which was treated with daptomycin along with rifampin after I&D and polyexchange.  He apparently developed a vancomycin allergy at that time.  He completed 6 weeks of therapy and then stopped.  He then returned in 2017 with knee pain and swelling and now was found again to have prosthetic joint infection was taken to the operating room but nothing grew from culture and he was treated for 6 weeks with daptomycin after I&D and polyexchange he is followed by Dr. Luciana Axe and placed on Augmentin in February 2018 and completed at least 5 months of oral therapy.  He then underwent additional I&D with pollex change in November 2018 recurrent right knee infection again with negative cultures.  Then had aspirate the knee that grew Citrobacter koseri that was fairly sensitive and underwent another I&D with polyexchange in May 2021.  He was treated with protracted oral ciprofloxacin but was having loosening of his prosthesis and was concern for infection and underwent I&D with resection arthroplasty now with an antibiotic spacer placed in September 2021.  OR cultures now yielded ampicillin sensitive Enterococcus faecalis.  He completed 6 weeks of IV Zosyn on October 22.  After that he was placed on Augmentin 875 125 twice daily until February 2.  It was then stopped and he ultimately underwent reimplantation of right total knee arthroplasty on December 07, 2020.  Cultures in the OR at  the time of surgery were without growth.  He was doing well until few days prior to admission this June 2022 he was admitted by orthopedics due to concerns for recurrence of prosthetic joint infection.  He was having increased discomfort drainage from an anterior aspect of the knee.  He underwent I&D by Dr. Despina Hick on June 22.  Cultures done in the  OR grew Pasteurella species.  Patient had been in the interim discharged on daptomycin and ceftriaxone prior to cultures resulting.  He was seen and followed by Dr.Manandhar and changed to continuous dose ampicillin the patient completed on followed with oral amoxicillin.   In asking him closely at last visit he told me he did  have a boxer that likes to lick his feet and also likes to jump into his lap frequently and sometimes scratches his skin.  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  I went to great lengths  several visits ago to explain that Pasteurella is a normal part of the oral flora of dogs and that the dog is not unhealthy and is in no way doing anything wrong with his behaviors though I did caution him to try to protect his skin and in particular his wounds from being scratched by his dog.  Sepsis seen in January by Dr. Despina Hick who rated his knee and obtained cell count differential I do not have access to as well as a culture that grew a staphylococcal species that was sensitive "against all antibiotics against present  was tested.  Amoxicillin indeed would NOT be active vs even MSSA. He was switched to ciprofloxacin and is experiencing less pain in the knee since then.   I obtained sensitivities on the organism and it was a Staphylococcus pseudo intermedius that was sensitive to oxacillin.    I have changed the patient over to Augmentin.  He has been following along with Dr.Alusio and not had further drainage of his knee.  Knee pain appears  to have been improving.  Unfortunately in the interim he has developed worsening swelling of the knee.  He underwent sterile aspiration of the knee which revealed "regular fluid per Dr. Tana Felts note.  Unfortunately the cultures from this aspirate have yielded an extended spectrum beta-lactamase producing Klebsiella species which curiously is reported as being sensitive to levofloxacin but resistant to ciprofloxacin sensitive to Bactrim and carbapenems but otherwise resistant.     He has been on levofloxacin and tolerated this.  Since I last saw him he had aspirate of the knee performed and this showed 13,806 white blood cells and 97% neutrophils which is disconcerting.  Cultures did not yield an organism.        Over the last several visits his knee pain and swelling has been relatively stable he has not required drainage of fluid by Dr. Despina Hick.  He is seeing Dr. Nickola Major for his rheumatoid arthritis now and asked if we could check a CMP and CBC with differential today.      Past Medical History:  Diagnosis Date   Allergy    OCC    Anemia    hx of   Asthma    Cataract    small- forming    Cellulitis of lower extremity    "usually RLL; this time it's got up to my lower abdoment" (02/11/2014)-series of antibiotics completed 02-28-14   Diverticulosis    Edema    legs   ESBL (extended spectrum beta-lactamase) producing bacteria infection 04/11/2022   GERD (gastroesophageal reflux disease)    GERD (gastroesophageal reflux disease) 04/11/2022   Hypertension    Infection of right knee (HCC)    OSA (obstructive sleep apnea)    does not use cpap    Pasteurella infection 05/17/2021   Pneumonia    20+ years ago   PONV (postoperative nausea and vomiting)    Rheumatoid arthritis (HCC) dx'd ~ 1977   S/P PICC central line placement 03-16-14   right upper arm remains intact.05-11-14 removed 1 week ago.   Sleep apnea    no cpap     Past Surgical History:  Procedure Laterality Date    COLONOSCOPY  2012   EXCISIONAL HEMORRHOIDECTOMY     EXCISIONAL TOTAL KNEE ARTHROPLASTY WITH ANTIBIOTIC SPACERS Right 03/18/2014   Procedure: RIGHT KNEE RESECTION ARTHROPLASTY WITH ANTIBIOTIC SPACERS;  Surgeon: Loanne Drilling, MD;  Location: [facility];  Service: Orthopedics;  Laterality: Right;   EXCISIONAL TOTAL KNEE ARTHROPLASTY WITH ANTIBIOTIC SPACERS Right 06/22/2020   Procedure: Right knee resection arthroplasty, antibiotic spacer;  Surgeon: Ollen Gross, MD;  Location: [facility];  Service: Orthopedics;  Laterality: Right;    FOREIGN BODY REMOVAL Left    "BB" removal above left eye-teen yrs.   HERNIA REPAIR     I & D KNEE WITH POLY EXCHANGE Right 04/25/2016   Procedure: RIGHT KNEE IRRIGATION AND DEBRIDEMENT WITH POLY EXCHANGE;  Surgeon: Ollen Gross, MD;  Location: [facility];  Service: Orthopedics;  Laterality: Right;   I & D KNEE WITH POLY EXCHANGE Right 10/01/2016  Procedure: IRRIGATION AND DEBRIDEMENT KNEE WITH POLY EXCHANGE;  Surgeon: Ollen Gross, MD;  Location: [facility];  Service: Orthopedics;  Laterality: Right;   I & D KNEE WITH POLY EXCHANGE Right 08/26/2017   Procedure: IRRIGATION AND DEBRIDEMENT KNEE WITH POLY EXCHANGE;  Surgeon: Ollen Gross, MD;  Location: [facility];  Service: Orthopedics;  Laterality: Right;   I & D KNEE WITH POLY EXCHANGE Right 11/16/2019   Procedure: IRRIGATION AND DEBRIDEMENT KNEE WITH POLY EXCHANGE;  Surgeon: Ollen Gross, MD;  Location: [facility];  Service: Orthopedics;  Laterality: Right;    I & D KNEE WITH POLY EXCHANGE Right 02/22/2020   Procedure: Right knee irrigation and debridement;  Surgeon: Ollen Gross, MD;  Location: [facility];  Service: Orthopedics;  Laterality: Right;    INCISION AND DRAINAGE Right 09/28/2014   Procedure: INCISION AND DRAINAGE RIGHT KNEE;  Surgeon: Loanne Drilling, MD;  Location: [facility];  Service: Orthopedics;  Laterality: Right;   IR FLUORO GUIDE CV LINE RIGHT  04/07/2021   IR REMOVAL TUN CV CATH W/O FL  05/17/2021   IR US  GUIDE VASC ACCESS RIGHT  04/07/2021   IRRIGATION AND DEBRIDEMENT KNEE Right 04/05/2021   Procedure: IRRIGATION AND DEBRIDEMENT KNEE;  Surgeon: Ollen Gross, MD;  Location: [facility];  Service: Orthopedics;  Laterality: Right;   KNEE BURSECTOMY Right 03/13/2017   Procedure: RIGHT KNEE PREPATELLAR BURSECTOMY;  Surgeon: Ollen Gross, MD;  Location: [facility];  Service: Orthopedics;  Laterality: Right;   picc line placement Right    right upper arm   REIMPLANTATION OF TOTAL KNEE Right 12/07/2020   Procedure: REIMPLANTATION OF RIGHT TOTAL KNEE;  Surgeon: Ollen Gross, MD;  Location: [facility];  Service: Orthopedics;  Laterality: Right;  2 hrs   REPLACEMENT TOTAL HIP W/  RESURFACING IMPLANTS Bilateral 01/2001; 08/2010   "left; right"   REPLACEMENT TOTAL KNEE BILATERAL Bilateral 10/1991; 10/2006   "right; left"   REVISION TOTAL KNEE ARTHROPLASTY Right 2012   SPIGELIAN HERNIA Right 12/04/2015   Procedure: INCARCERATED SPIGELIAN HERNIA REPAIR WITH MESH;  Surgeon: Violeta Gelinas, MD;  Location: [facility];  Service: General;  Laterality: Right;   TOTAL KNEE ARTHROPLASTY Right 05/21/2014   Procedure: RIGHT KNEE ARTHROPLASTY REINPLANTATION;  Surgeon: Loanne Drilling, MD;  Location: [facility];  Service: Orthopedics;  Laterality: Right;    Family History  Problem Relation Age of Onset   Colon cancer Mother 70       passed age 4 - stage 4    Hypertension Mother    Diabetes Mellitus II Paternal Grandmother    Colon polyps Neg Hx    Esophageal cancer Neg Hx    Stomach cancer Neg Hx    Rectal cancer Neg Hx       Social History   Socioeconomic History   Marital status: Married    Spouse name: Not on file   Number of children: 1   Years of education: 14   Highest education level: Not on file  Occupational History   Not on file  Tobacco Use   Smoking status: Former    Packs/day: 1.00    Years: 3.00    Total pack years: 3.00    Types: Cigarettes    Quit date: 03/16/1981    Years since quitting: 41.4    Smokeless tobacco: Former    Types: Snuff   Tobacco comments:    "quit smoking cigarettes in the 1980's"  Vaping Use   Vaping Use: Never used  Substance and Sexual  Activity   Alcohol use: No   Drug use: No   Sexual activity: Yes    Partners: Male  Other Topics Concern   Not on file  Social History Narrative   Not on file   Social Determinants of Health   Financial Resource Strain: Not on file  Food Insecurity: Not on file  Transportation Needs: Not on file  Physical Activity: Not on file  Stress: Not on file  Social Connections: Not on file    Allergies  Allergen Reactions   Avelox [Moxifloxacin Hcl In Nacl] Hives, Shortness Of Breath and Itching    Tolerates Cipro   Chlorhexidine     Skin red and burning after skin wash   Doxycycline Itching   Sulfa Antibiotics Itching   Vancomycin Hives and Itching   E.E.S. [Erythromycin] Rash   Erythrocin Rash   Sulfasalazine Rash     Current Outpatient Medications:    cholecalciferol (VITAMIN D3) 25 MCG (1000 UNIT) tablet, Take 1,000 Units by mouth daily., Disp: , Rfl:    Fluticasone-Salmeterol (ADVAIR) 250-50 MCG/DOSE AEPB, Inhale 1 puff into the lungs daily., Disp: , Rfl:    Ipratropium-Albuterol (COMBIVENT RESPIMAT) 20-100 MCG/ACT AERS respimat, Inhale 1 puff into the lungs every 6 (six) hours as needed for wheezing or shortness of breath., Disp: , Rfl:    levofloxacin (LEVAQUIN) 750 MG tablet, Take 1 tablet (750 mg total) by mouth daily., Disp: 30 tablet, Rfl: 11   losartan (COZAAR) 100 MG tablet, Take 100 mg by mouth daily. , Disp: , Rfl:    methocarbamol (ROBAXIN) 500 MG tablet, Take 1 tablet (500 mg total) by mouth every 6 (six) hours as needed for muscle spasms., Disp: 40 tablet, Rfl: 0   methotrexate (RHEUMATREX) 2.5 MG tablet, Take 10 mg by mouth every Monday. Caution:Chemotherapy. Protect from light., Disp: , Rfl:    Multiple Vitamin (MULTIVITAMIN WITH MINERALS) TABS tablet, Take 1 tablet by mouth daily., Disp: , Rfl:     RABEprazole (ACIPHEX) 20 MG tablet, Take 20 mg by mouth daily before breakfast. , Disp: , Rfl:    triamterene-hydrochlorothiazide (MAXZIDE-25) 37.5-25 MG tablet, Take 1 tablet by mouth daily., Disp: , Rfl:    diltiazem (CARDIZEM CD) 180 MG 24 hr capsule, Take 1 capsule (180 mg total) by mouth daily., Disp: 30 capsule, Rfl: 2   diltiazem (TIAZAC) 180 MG 24 hr capsule, , Disp: , Rfl:    MOUNJARO 5 MG/0.5ML Pen, , Disp: , Rfl:    nystatin (MYCOSTATIN/NYSTOP) powder, Apply 1 application topically 3 (three) times daily. (Patient not taking: Reported on 08/29/2022), Disp: 30 g, Rfl: 5   oxyCODONE (OXY IR/ROXICODONE) 5 MG immediate release tablet, Take 1-2 tablets (5-10 mg total) by mouth every 4 (four) hours as needed for severe pain. (Patient not taking: Reported on 05/30/2022), Disp: 30 tablet, Rfl: 0   RYBELSUS 7 MG TABS, Take 7 mg by mouth every morning. (Patient not taking: Reported on 08/29/2022), Disp: , Rfl:    tirzepatide (MOUNJARO) 10 MG/0.5ML Pen, Mounjaro 10 mg/0.5 mL subcutaneous pen injector  INJECT 10MG  SUBCUTANEOUS ONCE A WEEK 30 DAYS (Patient not taking: Reported on 08/29/2022), Disp: , Rfl:    tirzepatide (MOUNJARO) 10 MG/0.5ML Pen, Inject 10 mg into the skin once a week. (Patient not taking: Reported on 08/29/2022), Disp: 2 mL, Rfl: 5   tirzepatide (MOUNJARO) 12.5 MG/0.5ML Pen, Inject 12.5 mg into the skin once a week. (Patient not taking: Reported on 08/29/2022), Disp: 2 mL, Rfl: 6   tirzepatide (MOUNJARO) 7.5 MG/0.5ML  Pen, Inject 7.5 mg into the skin once a week. (Patient not taking: Reported on 08/29/2022), Disp: 2 mL, Rfl: 5   traMADol (ULTRAM) 50 MG tablet, Take 1-2 tablets (50-100 mg total) by mouth every 6 (six) hours as needed for moderate pain. (Patient not taking: Reported on 08/29/2022), Disp: 40 tablet, Rfl: 0   Review of Systems  Constitutional:  Negative for activity change, appetite change, chills, diaphoresis, fatigue, fever and unexpected weight change.  HENT:   Negative for congestion, rhinorrhea, sinus pressure, sneezing, sore throat and trouble swallowing.   Eyes:  Negative for photophobia and visual disturbance.  Respiratory:  Negative for cough, chest tightness, shortness of breath, wheezing and stridor.   Cardiovascular:  Negative for chest pain, palpitations and leg swelling.  Gastrointestinal:  Negative for abdominal distention, abdominal pain, anal bleeding, blood in stool, constipation, diarrhea, nausea and vomiting.  Genitourinary:  Negative for difficulty urinating, dysuria, flank pain and hematuria.  Musculoskeletal:  Positive for arthralgias and joint swelling. Negative for back pain, gait problem and myalgias.  Skin:  Negative for color change, pallor, rash and wound.  Neurological:  Negative for dizziness, tremors, weakness and light-headedness.  Hematological:  Negative for adenopathy. Does not bruise/bleed easily.  Psychiatric/Behavioral:  Negative for agitation, behavioral problems, confusion, decreased concentration, dysphoric mood and sleep disturbance.        Objective:   Physical Exam Constitutional:      Appearance: He is well-developed.  HENT:     Head: Normocephalic and atraumatic.  Eyes:     Conjunctiva/sclera: Conjunctivae normal.  Cardiovascular:     Rate and Rhythm: Normal rate and regular rhythm.  Pulmonary:     Effort: Pulmonary effort is normal. No respiratory distress.     Breath sounds: No wheezing.  Abdominal:     General: There is no distension.     Palpations: Abdomen is soft.  Musculoskeletal:        General: No tenderness. Normal range of motion.     Cervical back: Normal range of motion and neck supple.  Skin:    General: Skin is warm and dry.     Coloration: Skin is not pale.     Findings: No erythema or rash.  Neurological:     General: No focal deficit present.     Mental Status: He is alert and oriented to person, place, and time.  Psychiatric:        Mood and Affect: Mood normal.         Behavior: Behavior normal.        Thought Content: Thought content normal.        Judgment: Judgment normal.     Right knee 05/17/2021:        Knee 09/18/2021:       He showed me a photo of his knee before it was wrapped today Assessment & Plan:   Prosthetic joint infection:  For now we will continue with levofloxacin I will check a sed rate CRP CMP and CBC with differential     Rheumatoid arthritis: He is on methotrexate and followed by Dr. Nickola Major I will forward the CBC and CMP that I have ordered today (once they are back)to her.

## 2022-08-30 LAB — COMPLETE METABOLIC PANEL WITH GFR
AG Ratio: 1.2 (calc) (ref 1.0–2.5)
ALT: 11 U/L (ref 9–46)
AST: 19 U/L (ref 10–35)
Albumin: 3.2 g/dL — ABNORMAL LOW (ref 3.6–5.1)
Alkaline phosphatase (APISO): 60 U/L (ref 35–144)
BUN: 14 mg/dL (ref 7–25)
CO2: 28 mmol/L (ref 20–32)
Calcium: 9 mg/dL (ref 8.6–10.3)
Chloride: 107 mmol/L (ref 98–110)
Creat: 0.83 mg/dL (ref 0.70–1.35)
Globulin: 2.7 g/dL (calc) (ref 1.9–3.7)
Glucose, Bld: 104 mg/dL — ABNORMAL HIGH (ref 65–99)
Potassium: 4 mmol/L (ref 3.5–5.3)
Sodium: 140 mmol/L (ref 135–146)
Total Bilirubin: 0.4 mg/dL (ref 0.2–1.2)
Total Protein: 5.9 g/dL — ABNORMAL LOW (ref 6.1–8.1)
eGFR: 100 mL/min/{1.73_m2} (ref >=60)

## 2022-08-30 LAB — CBC WITH DIFFERENTIAL/PLATELET
Absolute Monocytes: 192 cells/uL — ABNORMAL LOW (ref 200–950)
Basophils Absolute: 48 cells/uL (ref 0–200)
Basophils Relative: 0.8 %
Eosinophils Absolute: 414 cells/uL (ref 15–500)
Eosinophils Relative: 6.9 %
HCT: 36.1 % — ABNORMAL LOW (ref 38.5–50.0)
Hemoglobin: 12.2 g/dL — ABNORMAL LOW (ref 13.2–17.1)
Lymphs Abs: 876 cells/uL (ref 850–3900)
MCH: 29.3 pg (ref 27.0–33.0)
MCHC: 33.8 g/dL (ref 32.0–36.0)
MCV: 86.6 fL (ref 80.0–100.0)
MPV: 9.2 fL (ref 7.5–12.5)
Monocytes Relative: 3.2 %
Neutro Abs: 4470 cells/uL (ref 1500–7800)
Neutrophils Relative %: 74.5 %
Platelets: 266 10*3/uL (ref 140–400)
RBC: 4.17 10*6/uL — ABNORMAL LOW (ref 4.20–5.80)
RDW: 14 % (ref 11.0–15.0)
Total Lymphocyte: 14.6 %
WBC: 6 10*3/uL (ref 3.8–10.8)

## 2022-08-30 LAB — C-REACTIVE PROTEIN: CRP: 53.7 mg/L — ABNORMAL HIGH (ref <8.0)

## 2022-08-30 LAB — SEDIMENTATION RATE: Sed Rate: 22 mm/h — ABNORMAL HIGH (ref 0–20)

## 2022-09-03 DIAGNOSIS — D649 Anemia, unspecified: Secondary | ICD-10-CM | POA: Diagnosis not present

## 2022-09-04 DIAGNOSIS — E1121 Type 2 diabetes mellitus with diabetic nephropathy: Secondary | ICD-10-CM | POA: Diagnosis not present

## 2022-09-04 DIAGNOSIS — Z125 Encounter for screening for malignant neoplasm of prostate: Secondary | ICD-10-CM | POA: Diagnosis not present

## 2022-09-04 DIAGNOSIS — I10 Essential (primary) hypertension: Secondary | ICD-10-CM | POA: Diagnosis not present

## 2022-09-04 DIAGNOSIS — D509 Iron deficiency anemia, unspecified: Secondary | ICD-10-CM | POA: Diagnosis not present

## 2022-09-05 DIAGNOSIS — D649 Anemia, unspecified: Secondary | ICD-10-CM | POA: Diagnosis not present

## 2022-09-10 DIAGNOSIS — R82998 Other abnormal findings in urine: Secondary | ICD-10-CM | POA: Diagnosis not present

## 2022-09-10 DIAGNOSIS — I10 Essential (primary) hypertension: Secondary | ICD-10-CM | POA: Diagnosis not present

## 2022-09-10 DIAGNOSIS — E1121 Type 2 diabetes mellitus with diabetic nephropathy: Secondary | ICD-10-CM | POA: Diagnosis not present

## 2022-09-10 DIAGNOSIS — Z1211 Encounter for screening for malignant neoplasm of colon: Secondary | ICD-10-CM | POA: Diagnosis not present

## 2022-12-05 DIAGNOSIS — Z79899 Other long term (current) drug therapy: Secondary | ICD-10-CM | POA: Diagnosis not present

## 2022-12-05 DIAGNOSIS — M009 Pyogenic arthritis, unspecified: Secondary | ICD-10-CM | POA: Diagnosis not present

## 2022-12-05 DIAGNOSIS — Z6841 Body Mass Index (BMI) 40.0 and over, adult: Secondary | ICD-10-CM | POA: Diagnosis not present

## 2022-12-05 DIAGNOSIS — M1991 Primary osteoarthritis, unspecified site: Secondary | ICD-10-CM | POA: Diagnosis not present

## 2022-12-05 DIAGNOSIS — M0579 Rheumatoid arthritis with rheumatoid factor of multiple sites without organ or systems involvement: Secondary | ICD-10-CM | POA: Diagnosis not present

## 2022-12-07 DIAGNOSIS — Z96651 Presence of right artificial knee joint: Secondary | ICD-10-CM | POA: Diagnosis not present

## 2023-02-13 ENCOUNTER — Telehealth: Payer: Self-pay

## 2023-02-13 ENCOUNTER — Ambulatory Visit: Payer: Self-pay | Admitting: Infectious Disease

## 2023-02-13 NOTE — Telephone Encounter (Signed)
Patient's wife called and said she was unaware of today's appointment and rescheduled to first available on 03/12/23. Said he will need refills on antibiotics to continue until next appointment. IF you have any questions best contact number is [phone]

## 2023-02-15 ENCOUNTER — Other Ambulatory Visit (HOSPITAL_BASED_OUTPATIENT_CLINIC_OR_DEPARTMENT_OTHER): Payer: Self-pay

## 2023-02-15 MED ORDER — MOUNJARO 5 MG/0.5ML ~~LOC~~ SOAJ
5.0000 mg | SUBCUTANEOUS | 3 refills | Status: AC
  Filled 2023-02-15 – 2023-03-12 (×4): qty 2, 28d supply, fill #0
  Filled 2023-04-13: qty 2, 28d supply, fill #1

## 2023-02-16 ENCOUNTER — Other Ambulatory Visit (HOSPITAL_BASED_OUTPATIENT_CLINIC_OR_DEPARTMENT_OTHER): Payer: Self-pay

## 2023-02-18 ENCOUNTER — Other Ambulatory Visit (HOSPITAL_BASED_OUTPATIENT_CLINIC_OR_DEPARTMENT_OTHER): Payer: Self-pay

## 2023-02-19 ENCOUNTER — Other Ambulatory Visit (HOSPITAL_BASED_OUTPATIENT_CLINIC_OR_DEPARTMENT_OTHER): Payer: Self-pay

## 2023-02-20 ENCOUNTER — Other Ambulatory Visit (HOSPITAL_BASED_OUTPATIENT_CLINIC_OR_DEPARTMENT_OTHER): Payer: Self-pay

## 2023-02-21 ENCOUNTER — Other Ambulatory Visit (HOSPITAL_BASED_OUTPATIENT_CLINIC_OR_DEPARTMENT_OTHER): Payer: Self-pay

## 2023-02-22 ENCOUNTER — Other Ambulatory Visit (HOSPITAL_BASED_OUTPATIENT_CLINIC_OR_DEPARTMENT_OTHER): Payer: Self-pay

## 2023-02-22 DIAGNOSIS — Z96651 Presence of right artificial knee joint: Secondary | ICD-10-CM | POA: Diagnosis not present

## 2023-02-22 DIAGNOSIS — M25461 Effusion, right knee: Secondary | ICD-10-CM | POA: Diagnosis not present

## 2023-03-11 NOTE — Progress Notes (Unsigned)
Subjective:  Chief complaint:  follow-up for PJI   patient ID: [name], male    DOB: [birth year], 62 y.o.   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  He then had prostghetic joint infection with Staphylococcus lugdunensis and (methicillin sensitive in 2017 which was treated with daptomycin along with rifampin after I&D and polyexchange.  He apparently developed a vancomycin allergy at that time.  He completed 6 weeks of therapy and then stopped.  He then returned in 2017 with knee pain and swelling and now was found again to have prosthetic joint infection was taken to the operating room but nothing grew from culture and he was treated for 6 weeks with daptomycin after I&D and polyexchange he is followed by Dr. Luciana Axe and placed on Augmentin in February 2018 and completed at least 5 months of oral therapy.  He then underwent additional I&D with pollex change in November 2018 recurrent right knee infection again with negative cultures.  Then had aspirate the knee that grew Citrobacter koseri that was fairly sensitive and underwent another I&D with polyexchange in May 2021.  He was treated with protracted oral ciprofloxacin but was having loosening of his prosthesis and was concern for infection and underwent I&D with resection arthroplasty now with an antibiotic spacer placed in September 2021.  OR cultures now yielded ampicillin sensitive Enterococcus faecalis.  He completed 6 weeks of IV Zosyn on October 22.  After that he was placed on Augmentin 875 125 twice daily until February 2.  It was then stopped and he ultimately underwent reimplantation of right total knee arthroplasty on December 07, 2020.  Cultures in the OR  at the time of surgery were without growth.  He was doing well until few days prior to admission this June 2022 he was admitted by orthopedics due to concerns for recurrence of prosthetic joint infection.  He was having increased discomfort drainage from an anterior aspect of the knee.  He underwent I&D by Dr. Despina Hick on June 22.  Cultures done in the  OR grew Pasteurella species.  Patient had been in the interim discharged on daptomycin and ceftriaxone prior to cultures resulting.  He was seen and followed by Dr.Manandhar and changed to continuous dose ampicillin the patient completed on followed with oral amoxicillin.   In asking him closely at last visit he told me he did  have a boxer that likes to lick his feet and also likes to jump into his lap frequently and sometimes scratches his skin.  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  I went to great lengths  several visits ago to explain that Pasteurella is a normal part of the oral flora of dogs and that the dog is not unhealthy and is in no way doing anything wrong with his behaviors though I did caution him to try to protect his skin and in particular his wounds from being scratched by his dog.  Sepsis seen in January by Dr. Despina Hick who rated his knee and obtained cell count differential I do not have access to as well as a culture that grew a staphylococcal species that was sensitive "against all antibiotics against  present was tested.  Amoxicillin indeed would NOT be active vs even MSSA. He was switched to ciprofloxacin and is experiencing less pain in the knee since then.   I obtained sensitivities on the organism and it was a Staphylococcus pseudo intermedius that was sensitive to oxacillin.    I have changed the patient over to Augmentin.  He has been following along with Dr.Alusio and not had further drainage of his knee.  Knee pain  appears to have been improving.  Unfortunately in the interim he has developed worsening swelling of the knee.  He underwent sterile aspiration of the knee which revealed "regular fluid per Dr. Tana Felts note.  Unfortunately the cultures from this aspirate have yielded an extended spectrum beta-lactamase producing Klebsiella species which curiously is reported as being sensitive to levofloxacin but resistant to ciprofloxacin sensitive to Bactrim and carbapenems but otherwise resistant.     He has been on levofloxacin and tolerated this.   Int the interm he had aspirate of the knee performed and this showed 13,806 white blood cells and 97% neutrophils which is disconcerting.  Cultures did not yield an organism.        Over the last several visits his knee pain and swelling has been relatively stable he has not required drainage of fluid by Dr. Despina Hick.  He is seeing Dr. Nickola Major for his rheumatoid arthritis now        Past Medical History:  Diagnosis Date   Allergy    OCC    Anemia    hx of   Asthma    Cataract    small- forming    Cellulitis of lower extremity    "usually RLL; this time it's got up to my lower abdoment" (02/11/2014)-series of antibiotics completed 02-28-14   Diverticulosis    Edema    legs   ESBL (extended spectrum beta-lactamase) producing bacteria infection 04/11/2022   GERD (gastroesophageal reflux disease)    GERD (gastroesophageal reflux disease) 04/11/2022   Hypertension    Infection of right knee (HCC)    OSA (obstructive sleep apnea)    does not use cpap    Pasteurella infection 05/17/2021   Pneumonia    20+ years ago   PONV (postoperative nausea and vomiting)    Rheumatoid arthritis (HCC) dx'd ~ 1977   S/P PICC central line placement 03-16-14   right upper arm remains intact.05-11-14 removed 1 week ago.   Sleep apnea    no cpap     Past Surgical History:  Procedure Laterality Date   COLONOSCOPY  2012   EXCISIONAL HEMORRHOIDECTOMY      EXCISIONAL TOTAL KNEE ARTHROPLASTY WITH ANTIBIOTIC SPACERS Right 03/18/2014   Procedure: RIGHT KNEE RESECTION ARTHROPLASTY WITH ANTIBIOTIC SPACERS;  Surgeon: Loanne Drilling, MD;  Location: [facility];  Service: Orthopedics;  Laterality: Right;   EXCISIONAL TOTAL KNEE ARTHROPLASTY WITH ANTIBIOTIC SPACERS Right 06/22/2020   Procedure: Right knee resection arthroplasty, antibiotic spacer;  Surgeon: Ollen Gross, MD;  Location: [facility];  Service: Orthopedics;  Laterality: Right;    FOREIGN BODY REMOVAL Left    "BB" removal above left eye-teen yrs.   HERNIA REPAIR     I & D KNEE WITH POLY EXCHANGE Right 04/25/2016   Procedure: RIGHT KNEE IRRIGATION AND DEBRIDEMENT WITH POLY EXCHANGE;  Surgeon: Ollen Gross, MD;  Location: [facility];  Service: Orthopedics;  Laterality: Right;   I & D KNEE WITH POLY EXCHANGE Right 10/01/2016   Procedure: IRRIGATION AND DEBRIDEMENT KNEE WITH POLY EXCHANGE;  Surgeon:  Ollen Gross, MD;  Location: [facility];  Service: Orthopedics;  Laterality: Right;   I & D KNEE WITH POLY EXCHANGE Right 08/26/2017   Procedure: IRRIGATION AND DEBRIDEMENT KNEE WITH POLY EXCHANGE;  Surgeon: Ollen Gross, MD;  Location: [facility];  Service: Orthopedics;  Laterality: Right;   I & D KNEE WITH POLY EXCHANGE Right 11/16/2019   Procedure: IRRIGATION AND DEBRIDEMENT KNEE WITH POLY EXCHANGE;  Surgeon: Ollen Gross, MD;  Location: [facility];  Service: Orthopedics;  Laterality: Right;    I & D KNEE WITH POLY EXCHANGE Right 02/22/2020   Procedure: Right knee irrigation and debridement;  Surgeon: Ollen Gross, MD;  Location: [facility];  Service: Orthopedics;  Laterality: Right;    INCISION AND DRAINAGE Right 09/28/2014   Procedure: INCISION AND DRAINAGE RIGHT KNEE;  Surgeon: Loanne Drilling, MD;  Location: [facility];  Service: Orthopedics;  Laterality: Right;   IR FLUORO GUIDE CV LINE RIGHT  04/07/2021   IR REMOVAL TUN CV CATH W/O FL  05/17/2021   IR US GUIDE VASC ACCESS RIGHT  04/07/2021   IRRIGATION AND  DEBRIDEMENT KNEE Right 04/05/2021   Procedure: IRRIGATION AND DEBRIDEMENT KNEE;  Surgeon: Ollen Gross, MD;  Location: [facility];  Service: Orthopedics;  Laterality: Right;   KNEE BURSECTOMY Right 03/13/2017   Procedure: RIGHT KNEE PREPATELLAR BURSECTOMY;  Surgeon: Ollen Gross, MD;  Location: [facility];  Service: Orthopedics;  Laterality: Right;   picc line placement Right    right upper arm   REIMPLANTATION OF TOTAL KNEE Right 12/07/2020   Procedure: REIMPLANTATION OF RIGHT TOTAL KNEE;  Surgeon: Ollen Gross, MD;  Location: [facility];  Service: Orthopedics;  Laterality: Right;  2 hrs   REPLACEMENT TOTAL HIP W/  RESURFACING IMPLANTS Bilateral 01/2001; 08/2010   "left; right"   REPLACEMENT TOTAL KNEE BILATERAL Bilateral 10/1991; 10/2006   "right; left"   REVISION TOTAL KNEE ARTHROPLASTY Right 2012   SPIGELIAN HERNIA Right 12/04/2015   Procedure: INCARCERATED SPIGELIAN HERNIA REPAIR WITH MESH;  Surgeon: Violeta Gelinas, MD;  Location: [facility];  Service: General;  Laterality: Right;   TOTAL KNEE ARTHROPLASTY Right 05/21/2014   Procedure: RIGHT KNEE ARTHROPLASTY REINPLANTATION;  Surgeon: Loanne Drilling, MD;  Location: [facility];  Service: Orthopedics;  Laterality: Right;    Family History  Problem Relation Age of Onset   Colon cancer Mother 73       passed age 34 - stage 4    Hypertension Mother    Diabetes Mellitus II Paternal Grandmother    Colon polyps Neg Hx    Esophageal cancer Neg Hx    Stomach cancer Neg Hx    Rectal cancer Neg Hx       Social History   Socioeconomic History   Marital status: Married    Spouse name: Not on file   Number of children: 1   Years of education: 14   Highest education level: Not on file  Occupational History   Not on file  Tobacco Use   Smoking status: Former    Packs/day: 1.00    Years: 3.00    Additional pack years: 0.00    Total pack years: 3.00    Types: Cigarettes    Quit date: 03/16/1981    Years since quitting: 42.0   Smokeless tobacco: Former     Types: Snuff   Tobacco comments:    "quit smoking cigarettes in the 1980's"  Vaping Use   Vaping Use: Never used  Substance and Sexual Activity  Alcohol use: No   Drug use: No   Sexual activity: Yes    Partners: Male  Other Topics Concern   Not on file  Social History Narrative   Not on file   Social Determinants of Health   Financial Resource Strain: Not on file  Food Insecurity: Not on file  Transportation Needs: Not on file  Physical Activity: Not on file  Stress: Not on file  Social Connections: Not on file    Allergies  Allergen Reactions   Avelox [Moxifloxacin Hcl In Nacl] Hives, Shortness Of Breath and Itching    Tolerates Cipro   Chlorhexidine     Skin red and burning after skin wash   Doxycycline Itching   Sulfa Antibiotics Itching   Vancomycin Hives and Itching   E.E.S. [Erythromycin] Rash   Erythrocin Rash   Sulfasalazine Rash     Current Outpatient Medications:    cholecalciferol (VITAMIN D3) 25 MCG (1000 UNIT) tablet, Take 1,000 Units by mouth daily., Disp: , Rfl:    diltiazem (CARDIZEM CD) 180 MG 24 hr capsule, Take 1 capsule (180 mg total) by mouth daily. (Patient not taking: Reported on 08/29/2022), Disp: 30 capsule, Rfl: 2   diltiazem (TIAZAC) 180 MG 24 hr capsule, , Disp: , Rfl:    DILTIAZEM HCL ER PO, Take 180 mg by mouth daily., Disp: , Rfl:    Fluticasone-Salmeterol (ADVAIR) 250-50 MCG/DOSE AEPB, Inhale 1 puff into the lungs daily., Disp: , Rfl:    folic acid (FOLVITE) 1 MG tablet, Take 1 mg by mouth daily., Disp: , Rfl:    Ipratropium-Albuterol (COMBIVENT RESPIMAT) 20-100 MCG/ACT AERS respimat, Inhale 1 puff into the lungs every 6 (six) hours as needed for wheezing or shortness of breath., Disp: , Rfl:    levofloxacin (LEVAQUIN) 750 MG tablet, Take 1 tablet (750 mg total) by mouth daily., Disp: 30 tablet, Rfl: 11   losartan (COZAAR) 100 MG tablet, Take 100 mg by mouth daily. , Disp: , Rfl:    methocarbamol (ROBAXIN) 500 MG tablet, Take 1  tablet (500 mg total) by mouth every 6 (six) hours as needed for muscle spasms., Disp: 40 tablet, Rfl: 0   methotrexate (RHEUMATREX) 2.5 MG tablet, Take 10 mg by mouth every Monday. Caution:Chemotherapy. Protect from light., Disp: , Rfl:    MOUNJARO 5 MG/0.5ML Pen, , Disp: , Rfl:    Multiple Vitamin (MULTIVITAMIN WITH MINERALS) TABS tablet, Take 1 tablet by mouth daily., Disp: , Rfl:    nystatin (MYCOSTATIN/NYSTOP) powder, Apply 1 application topically 3 (three) times daily. (Patient not taking: Reported on 08/29/2022), Disp: 30 g, Rfl: 5   oxyCODONE (OXY IR/ROXICODONE) 5 MG immediate release tablet, Take 1-2 tablets (5-10 mg total) by mouth every 4 (four) hours as needed for severe pain. (Patient not taking: Reported on 05/30/2022), Disp: 30 tablet, Rfl: 0   RABEprazole (ACIPHEX) 20 MG tablet, Take 20 mg by mouth daily before breakfast. , Disp: , Rfl:    RYBELSUS 7 MG TABS, Take 7 mg by mouth every morning. (Patient not taking: Reported on 08/29/2022), Disp: , Rfl:    tirzepatide (MOUNJARO) 10 MG/0.5ML Pen, Mounjaro 10 mg/0.5 mL subcutaneous pen injector  INJECT 10MG  SUBCUTANEOUS ONCE A WEEK 30 DAYS (Patient not taking: Reported on 08/29/2022), Disp: , Rfl:    tirzepatide (MOUNJARO) 10 MG/0.5ML Pen, Inject 10 mg into the skin once a week. (Patient not taking: Reported on 08/29/2022), Disp: 2 mL, Rfl: 5   tirzepatide (MOUNJARO) 12.5 MG/0.5ML Pen, Inject 12.5 mg into the skin  once a week. (Patient not taking: Reported on 08/29/2022), Disp: 2 mL, Rfl: 6   tirzepatide (MOUNJARO) 5 MG/0.5ML Pen, Inject 5 mg into the skin once a week., Disp: 2 mL, Rfl: 3   tirzepatide (MOUNJARO) 7.5 MG/0.5ML Pen, Inject 7.5 mg into the skin once a week. (Patient not taking: Reported on 08/29/2022), Disp: 2 mL, Rfl: 5   traMADol (ULTRAM) 50 MG tablet, Take 1-2 tablets (50-100 mg total) by mouth every 6 (six) hours as needed for moderate pain. (Patient not taking: Reported on 08/29/2022), Disp: 40 tablet, Rfl: 0    triamterene-hydrochlorothiazide (MAXZIDE-25) 37.5-25 MG tablet, Take 1 tablet by mouth daily., Disp: , Rfl:    Review of Systems     Objective:   Physical Exam  Right knee 05/17/2021:        Knee 09/18/2021:       He showed me a photo of his knee before it was wrapped today Assessment & Plan:   PJI:  I will check ESR, CRP, CMP CBC with diff  I will cointinue his levaquin rx  RA; he is following Dr. Nickola Major

## 2023-03-12 ENCOUNTER — Encounter: Payer: Self-pay | Admitting: Infectious Disease

## 2023-03-12 ENCOUNTER — Other Ambulatory Visit: Payer: Self-pay

## 2023-03-12 ENCOUNTER — Ambulatory Visit: Payer: Medicare HMO | Admitting: Infectious Disease

## 2023-03-12 ENCOUNTER — Other Ambulatory Visit (HOSPITAL_COMMUNITY): Payer: Self-pay

## 2023-03-12 VITALS — BP 153/85 | HR 86 | Temp 98.1°F | Ht 68.0 in | Wt 292.0 lb

## 2023-03-12 DIAGNOSIS — T8453XA Infection and inflammatory reaction due to internal right knee prosthesis, initial encounter: Secondary | ICD-10-CM | POA: Diagnosis not present

## 2023-03-12 DIAGNOSIS — I159 Secondary hypertension, unspecified: Secondary | ICD-10-CM

## 2023-03-12 DIAGNOSIS — M069 Rheumatoid arthritis, unspecified: Secondary | ICD-10-CM

## 2023-03-12 DIAGNOSIS — J302 Other seasonal allergic rhinitis: Secondary | ICD-10-CM

## 2023-03-12 DIAGNOSIS — T8450XD Infection and inflammatory reaction due to unspecified internal joint prosthesis, subsequent encounter: Secondary | ICD-10-CM | POA: Diagnosis not present

## 2023-03-12 DIAGNOSIS — A499 Bacterial infection, unspecified: Secondary | ICD-10-CM | POA: Diagnosis not present

## 2023-03-12 DIAGNOSIS — Z1612 Extended spectrum beta lactamase (ESBL) resistance: Secondary | ICD-10-CM | POA: Diagnosis not present

## 2023-03-12 DIAGNOSIS — J4530 Mild persistent asthma, uncomplicated: Secondary | ICD-10-CM

## 2023-03-12 MED ORDER — LEVOFLOXACIN 750 MG PO TABS: 750.0000 mg | ORAL_TABLET | Freq: Every day | ORAL | 11 refills | Status: DC

## 2023-03-13 LAB — COMPLETE METABOLIC PANEL WITH GFR
AG Ratio: 1.3 (calc) (ref 1.0–2.5)
ALT: 11 U/L (ref 9–46)
AST: 19 U/L (ref 10–35)
Albumin: 3.5 g/dL — ABNORMAL LOW (ref 3.6–5.1)
Alkaline phosphatase (APISO): 72 U/L (ref 35–144)
BUN: 15 mg/dL (ref 7–25)
CO2: 27 mmol/L (ref 20–32)
Calcium: 9.2 mg/dL (ref 8.6–10.3)
Chloride: 105 mmol/L (ref 98–110)
Creat: 0.94 mg/dL (ref 0.70–1.35)
Globulin: 2.8 g/dL (calc) (ref 1.9–3.7)
Glucose, Bld: 86 mg/dL (ref 65–99)
Potassium: 4.2 mmol/L (ref 3.5–5.3)
Sodium: 139 mmol/L (ref 135–146)
Total Bilirubin: 0.5 mg/dL (ref 0.2–1.2)
Total Protein: 6.3 g/dL (ref 6.1–8.1)
eGFR: 92 mL/min/{1.73_m2} (ref >=60)

## 2023-03-13 LAB — CBC WITH DIFFERENTIAL/PLATELET
Absolute Monocytes: 90 cells/uL — ABNORMAL LOW (ref 200–950)
Basophils Absolute: 41 cells/uL (ref 0–200)
Basophils Relative: 0.6 %
Eosinophils Absolute: 283 cells/uL (ref 15–500)
Eosinophils Relative: 4.1 %
HCT: 40.4 % (ref 38.5–50.0)
Hemoglobin: 13.4 g/dL (ref 13.2–17.1)
Lymphs Abs: 1007 cells/uL (ref 850–3900)
MCH: 28.6 pg (ref 27.0–33.0)
MCHC: 33.2 g/dL (ref 32.0–36.0)
MCV: 86.1 fL (ref 80.0–100.0)
MPV: 9.3 fL (ref 7.5–12.5)
Monocytes Relative: 1.3 %
Neutro Abs: 5479 cells/uL (ref 1500–7800)
Neutrophils Relative %: 79.4 %
Platelets: 283 10*3/uL (ref 140–400)
RBC: 4.69 10*6/uL (ref 4.20–5.80)
RDW: 14.5 % (ref 11.0–15.0)
Total Lymphocyte: 14.6 %
WBC: 6.9 10*3/uL (ref 3.8–10.8)

## 2023-03-13 LAB — SEDIMENTATION RATE: Sed Rate: 39 mm/h — ABNORMAL HIGH (ref 0–20)

## 2023-03-13 LAB — C-REACTIVE PROTEIN: CRP: 38 mg/L — ABNORMAL HIGH (ref <8.0)

## 2023-03-20 ENCOUNTER — Other Ambulatory Visit (HOSPITAL_COMMUNITY): Payer: Self-pay

## 2023-04-04 DIAGNOSIS — Q828 Other specified congenital malformations of skin: Secondary | ICD-10-CM | POA: Diagnosis not present

## 2023-04-04 DIAGNOSIS — L821 Other seborrheic keratosis: Secondary | ICD-10-CM | POA: Diagnosis not present

## 2023-04-04 DIAGNOSIS — L570 Actinic keratosis: Secondary | ICD-10-CM | POA: Diagnosis not present

## 2023-05-10 ENCOUNTER — Other Ambulatory Visit (HOSPITAL_COMMUNITY): Payer: Self-pay

## 2023-05-10 MED ORDER — MOUNJARO 7.5 MG/0.5ML ~~LOC~~ SOAJ
7.5000 mg | SUBCUTANEOUS | 5 refills | Status: AC
  Filled 2023-05-10: qty 2, 28d supply, fill #0

## 2023-06-05 DIAGNOSIS — Z79899 Other long term (current) drug therapy: Secondary | ICD-10-CM | POA: Diagnosis not present

## 2023-06-05 DIAGNOSIS — Z6841 Body Mass Index (BMI) 40.0 and over, adult: Secondary | ICD-10-CM | POA: Diagnosis not present

## 2023-06-05 DIAGNOSIS — M0579 Rheumatoid arthritis with rheumatoid factor of multiple sites without organ or systems involvement: Secondary | ICD-10-CM | POA: Diagnosis not present

## 2023-06-05 DIAGNOSIS — M1991 Primary osteoarthritis, unspecified site: Secondary | ICD-10-CM | POA: Diagnosis not present

## 2023-06-06 LAB — LAB REPORT - SCANNED: EGFR: 82

## 2023-06-07 DIAGNOSIS — M25461 Effusion, right knee: Secondary | ICD-10-CM | POA: Diagnosis not present

## 2023-06-07 DIAGNOSIS — Z96651 Presence of right artificial knee joint: Secondary | ICD-10-CM | POA: Diagnosis not present

## 2023-06-12 ENCOUNTER — Other Ambulatory Visit (HOSPITAL_COMMUNITY): Payer: Self-pay

## 2023-06-14 ENCOUNTER — Telehealth: Payer: Self-pay

## 2023-06-14 ENCOUNTER — Other Ambulatory Visit: Payer: Self-pay

## 2023-06-14 MED ORDER — LEVOFLOXACIN 750 MG PO TABS: 750.0000 mg | ORAL_TABLET | Freq: Every day | ORAL | 2 refills | Status: DC

## 2023-06-14 NOTE — Telephone Encounter (Signed)
Patient's wife called requesting new prescription for Levaquin be sent to [facility]. Is also requesting 90 day supply. Prescription sent as requested. Juanita Laster, RMA

## 2023-09-04 ENCOUNTER — Ambulatory Visit: Payer: Medicare HMO | Admitting: Infectious Disease

## 2023-09-04 ENCOUNTER — Other Ambulatory Visit: Payer: Self-pay

## 2023-09-04 ENCOUNTER — Encounter: Payer: Self-pay | Admitting: Infectious Disease

## 2023-09-04 VITALS — BP 165/88 | HR 84 | Temp 98.7°F | Ht 68.0 in | Wt 285.0 lb

## 2023-09-04 DIAGNOSIS — A499 Bacterial infection, unspecified: Secondary | ICD-10-CM | POA: Diagnosis not present

## 2023-09-04 DIAGNOSIS — A280 Pasteurellosis: Secondary | ICD-10-CM

## 2023-09-04 DIAGNOSIS — M069 Rheumatoid arthritis, unspecified: Secondary | ICD-10-CM | POA: Diagnosis not present

## 2023-09-04 DIAGNOSIS — I159 Secondary hypertension, unspecified: Secondary | ICD-10-CM

## 2023-09-04 DIAGNOSIS — T8453XD Infection and inflammatory reaction due to internal right knee prosthesis, subsequent encounter: Secondary | ICD-10-CM

## 2023-09-04 DIAGNOSIS — Z7185 Encounter for immunization safety counseling: Secondary | ICD-10-CM

## 2023-09-04 MED ORDER — LEVOFLOXACIN 750 MG PO TABS: 750.0000 mg | ORAL_TABLET | Freq: Every day | ORAL | 3 refills | Status: AC

## 2023-09-04 NOTE — Progress Notes (Signed)
Subjective:  Chief complaint: Follow-up for prosthetic joint infection   patient ID: [name], male    DOB: 08-Mar-1961, 62 y.o.   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  He then had prostghetic joint infection with Staphylococcus lugdunensis and (methicillin sensitive in 2017 which was treated with daptomycin along with rifampin after I&D and polyexchange.  He apparently developed a vancomycin allergy at that time.  He completed 6 weeks of therapy and then stopped.  He then returned in 2017 with knee pain and swelling and now was found again to have prosthetic joint infection was taken to the operating room but nothing grew from culture and he was treated for 6 weeks with daptomycin after I&D and polyexchange he is followed by Dr. Luciana Axe and placed on Augmentin in February 2018 and completed at least 5 months of oral therapy.  He then underwent additional I&D with pollex change in November 2018 recurrent right knee infection again with negative cultures.  Then had aspirate the knee that grew Citrobacter koseri that was fairly sensitive and underwent another I&D with polyexchange in May 2021.  He was treated with protracted oral ciprofloxacin but was having loosening of his prosthesis and was concern for infection and underwent I&D with resection arthroplasty now with an antibiotic spacer placed in September 2021.  OR cultures now yielded ampicillin sensitive Enterococcus faecalis.  He completed 6 weeks of IV Zosyn on October 22.  After that he was placed on Augmentin 875 125 twice daily until February 2.  It was then stopped and he ultimately underwent reimplantation of right total knee arthroplasty on December 07, 2020.  Cultures in the OR at the time of surgery were without growth.  He was doing well until few days prior to admission this June 2022 he was admitted by orthopedics due to concerns for recurrence of prosthetic joint infection.  He was having increased discomfort drainage from an anterior aspect of the knee.  He underwent I&D by Dr. Despina Hick on June 22.  Cultures done in the  OR grew Pasteurella species.  Patient had been in the interim discharged on daptomycin and ceftriaxone prior to cultures resulting.  He was seen and followed by Dr.Manandhar and changed to continuous dose ampicillin the patient completed on followed with oral amoxicillin.   In asking him closely at of his visits he told me he did  have a boxer that likes to lick his feet and also likes to jump into his lap frequently and sometimes scratches his skin.  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  I went to great lengths  several visits ago to explain that Pasteurella is a normal part of the oral flora of dogs and that the dog is not unhealthy and is in no way doing anything wrong with his behaviors though I did caution him to try to protect his skin and in particular his wounds from being scratched by his dog.  Seen in January by Dr. Despina Hick who rated his knee and obtained cell count differential I do not have access to as well as a culture that grew a staphylococcal species that was sensitive "against all antibiotics  against present was tested.  Amoxicillin indeed would NOT be active vs even MSSA. He was switched to ciprofloxacin and was experiencing less pain in the knee since then.   I obtained sensitivities on the organism and it was a Staphylococcus pseudo intermedius that was sensitive to oxacillin.    I hanged the patient over to Augmentin.  He has been following along with Dr.Alusio and not had further drainage of his  knee.  Knee pain appeared to have been improving.  Unfortunately in the interim he has developed worsening swelling of the knee.  He underwent sterile aspiration of the knee which revealed "regular fluid per Dr. Tana Felts note.  Unfortunately the cultures from this aspirate have yielded an extended spectrum beta-lactamase producing Klebsiella species which curiously is reported as being sensitive to levofloxacin but resistant to ciprofloxacin sensitive to Bactrim and carbapenems but otherwise resistant.     He has been on levofloxacin and tolerated this.   Int the interm he had aspirate of the knee performed and this showed 13,806 white blood cells and 97% neutrophils which is disconcerting.  Cultures did not yield an organism.        Over the last several visits his knee pain and swelling has been relatively stable he has not required drainage of fluid by Dr. Despina Hick.  He is seeing Dr. Nickola Major for his rheumatoid arthritis now   Apparently she wants LFTs checked with her his meds chronically so we are adding LFTs to our labs that we routinely check with for [facility].  His knee painhad not him worsened.  He has seen Dr. Despina Hick who drew fluid off his knee earlier this month but did not sent for fluid analysis or culture.  Cedric states that Dr.  Despina Hick felt the fluid did not look infected.  When I last saw him in May 24   I last saw [place] he has not had any further aspirations of his knee by Dr. Despina Hick.  His knee pain is currently stable worsening when he has accumulation of fluid in the joint  Is following with Dr. Zenovia Jordan and taking methotrexate for his rheumatoid arthritis    Past Medical History:  Diagnosis Date   Allergy    OCC    Anemia    hx of   Asthma    Cataract    small- forming    Cellulitis of lower extremity    "usually RLL; this time it's got up to my lower abdoment" (02/11/2014)-series of antibiotics completed 02-28-14   Diverticulosis    Edema     legs   ESBL (extended spectrum beta-lactamase) producing bacteria infection 04/11/2022   GERD (gastroesophageal reflux disease)    GERD (gastroesophageal reflux disease) 04/11/2022   Hypertension    Infection of right knee (HCC)    OSA (obstructive sleep apnea)    does not use cpap    Pasteurella infection 05/17/2021   Pneumonia    20+ years ago   PONV (postoperative nausea and vomiting)    Rheumatoid arthritis (HCC) dx'd ~ 1977   S/P PICC central line placement 03-16-14   right upper arm remains intact.05-11-14 removed 1 week ago.   Sleep apnea    no cpap     Past Surgical History:  Procedure Laterality Date   COLONOSCOPY  2012   EXCISIONAL HEMORRHOIDECTOMY     EXCISIONAL TOTAL KNEE ARTHROPLASTY WITH ANTIBIOTIC SPACERS Right 03/18/2014   Procedure: RIGHT KNEE RESECTION ARTHROPLASTY WITH ANTIBIOTIC SPACERS;  Surgeon: Loanne Drilling, MD;  Location: [facility];  Service: Orthopedics;  Laterality: Right;   EXCISIONAL TOTAL KNEE ARTHROPLASTY WITH ANTIBIOTIC SPACERS Right 06/22/2020   Procedure: Right knee resection arthroplasty, antibiotic spacer;  Surgeon: Ollen Gross, MD;  Location: [facility];  Service: Orthopedics;  Laterality: Right;    FOREIGN BODY REMOVAL Left    "BB" removal above left eye-teen yrs.   HERNIA REPAIR     I & D KNEE WITH POLY EXCHANGE Right 04/25/2016   Procedure: RIGHT KNEE IRRIGATION AND DEBRIDEMENT WITH POLY EXCHANGE;  Surgeon: Ollen Gross, MD;  Location: [facility];  Service: Orthopedics;  Laterality: Right;   I & D KNEE WITH POLY EXCHANGE Right 10/01/2016   Procedure: IRRIGATION AND DEBRIDEMENT KNEE WITH POLY EXCHANGE;  Surgeon: Ollen Gross, MD;  Location: [facility];  Service: Orthopedics;  Laterality: Right;   I & D KNEE WITH POLY EXCHANGE Right 08/26/2017   Procedure: IRRIGATION AND DEBRIDEMENT KNEE WITH POLY EXCHANGE;  Surgeon: Ollen Gross, MD;  Location: [facility];  Service: Orthopedics;  Laterality: Right;   I & D KNEE WITH POLY EXCHANGE Right 11/16/2019    Procedure: IRRIGATION AND DEBRIDEMENT KNEE WITH POLY EXCHANGE;  Surgeon: Ollen Gross, MD;  Location: [facility];  Service: Orthopedics;  Laterality: Right;    I & D KNEE WITH POLY EXCHANGE Right 02/22/2020   Procedure: Right knee irrigation and debridement;  Surgeon: Ollen Gross, MD;  Location: [facility];  Service: Orthopedics;  Laterality: Right;    INCISION AND DRAINAGE Right 09/28/2014   Procedure: INCISION AND DRAINAGE RIGHT KNEE;  Surgeon: Loanne Drilling, MD;  Location: [facility];  Service: Orthopedics;  Laterality: Right;   IR FLUORO GUIDE CV LINE RIGHT  04/07/2021   IR REMOVAL TUN CV CATH W/O FL  05/17/2021   IR US GUIDE VASC ACCESS RIGHT  04/07/2021   IRRIGATION AND DEBRIDEMENT KNEE Right 04/05/2021   Procedure: IRRIGATION AND DEBRIDEMENT KNEE;  Surgeon: Ollen Gross, MD;  Location: [facility];  Service: Orthopedics;  Laterality: Right;   KNEE BURSECTOMY Right 03/13/2017   Procedure: RIGHT KNEE PREPATELLAR BURSECTOMY;  Surgeon: Ollen Gross, MD;  Location: [facility];  Service: Orthopedics;  Laterality: Right;   picc line placement Right    right upper arm   REIMPLANTATION OF TOTAL KNEE Right 12/07/2020   Procedure: REIMPLANTATION OF RIGHT TOTAL KNEE;  Surgeon: Ollen Gross, MD;  Location: [facility];  Service: Orthopedics;  Laterality: Right;  2 hrs   REPLACEMENT TOTAL HIP W/  RESURFACING IMPLANTS Bilateral 01/2001; 08/2010   "left; right"   REPLACEMENT TOTAL KNEE BILATERAL Bilateral 10/1991; 10/2006   "right; left"   REVISION TOTAL KNEE ARTHROPLASTY Right 2012   SPIGELIAN HERNIA Right 12/04/2015   Procedure: INCARCERATED SPIGELIAN HERNIA REPAIR WITH MESH;  Surgeon: Violeta Gelinas, MD;  Location: [facility];  Service: General;  Laterality: Right;   TOTAL KNEE ARTHROPLASTY Right 05/21/2014   Procedure: RIGHT KNEE ARTHROPLASTY REINPLANTATION;  Surgeon: Loanne Drilling, MD;  Location: [facility];  Service: Orthopedics;  Laterality: Right;    Family History  Problem Relation Age of Onset   Colon cancer  Mother 74       passed age 4 - stage 4    Hypertension Mother    Diabetes Mellitus II Paternal Grandmother    Colon polyps Neg Hx    Esophageal cancer Neg Hx    Stomach cancer Neg Hx    Rectal cancer Neg Hx       Social History   Socioeconomic History   Marital  status: Married    Spouse name: Not on file   Number of children: 1   Years of education: 14   Highest education level: Not on file  Occupational History   Not on file  Tobacco Use   Smoking status: Former    Current packs/day: 0.00    Average packs/day: 1 pack/day for 3.0 years (3.0 ttl pk-yrs)    Types: Cigarettes    Start date: 03/16/1978    Quit date: 03/16/1981    Years since quitting: 42.4   Smokeless tobacco: Former    Types: Snuff   Tobacco comments:    "quit smoking cigarettes in the 1980's"  Vaping Use   Vaping status: Never Used  Substance and Sexual Activity   Alcohol use: No   Drug use: No   Sexual activity: Yes    Partners: Male  Other Topics Concern   Not on file  Social History Narrative   Not on file   Social Determinants of Health   Financial Resource Strain: Not on file  Food Insecurity: Not on file  Transportation Needs: Not on file  Physical Activity: Not on file  Stress: Not on file  Social Connections: Not on file    Allergies  Allergen Reactions   Avelox [Moxifloxacin Hcl In Nacl] Hives, Shortness Of Breath and Itching    Tolerates Cipro   Chlorhexidine     Skin red and burning after skin wash   Doxycycline Itching   Sulfa Antibiotics Itching   Vancomycin Hives and Itching   E.E.S. [Erythromycin] Rash   Erythrocin Rash   Sulfasalazine Rash     Current Outpatient Medications:    cholecalciferol (VITAMIN D3) 25 MCG (1000 UNIT) tablet, Take 1,000 Units by mouth daily., Disp: , Rfl:    diltiazem (CARDIZEM CD) 180 MG 24 hr capsule, Take 1 capsule (180 mg total) by mouth daily., Disp: 30 capsule, Rfl: 2   diltiazem (TIAZAC) 180 MG 24 hr capsule, , Disp: , Rfl:     DILTIAZEM HCL ER PO, Take 180 mg by mouth daily. (Patient not taking: Reported on 03/12/2023), Disp: , Rfl:    Fluticasone-Salmeterol (ADVAIR) 250-50 MCG/DOSE AEPB, Inhale 1 puff into the lungs daily., Disp: , Rfl:    folic acid (FOLVITE) 1 MG tablet, Take 1 mg by mouth daily., Disp: , Rfl:    Ipratropium-Albuterol (COMBIVENT RESPIMAT) 20-100 MCG/ACT AERS respimat, Inhale 1 puff into the lungs every 6 (six) hours as needed for wheezing or shortness of breath., Disp: , Rfl:    levofloxacin (LEVAQUIN) 750 MG tablet, Take 1 tablet (750 mg total) by mouth daily., Disp: 90 tablet, Rfl: 2   losartan (COZAAR) 100 MG tablet, Take 100 mg by mouth daily. , Disp: , Rfl:    methocarbamol (ROBAXIN) 500 MG tablet, Take 1 tablet (500 mg total) by mouth every 6 (six) hours as needed for muscle spasms., Disp: 40 tablet, Rfl: 0   methotrexate (RHEUMATREX) 2.5 MG tablet, Take 10 mg by mouth every Monday. Caution:Chemotherapy. Protect from light., Disp: , Rfl:    MOUNJARO 5 MG/0.5ML Pen, , Disp: , Rfl:    Multiple Vitamin (MULTIVITAMIN WITH MINERALS) TABS tablet, Take 1 tablet by mouth daily., Disp: , Rfl:    nystatin (MYCOSTATIN/NYSTOP) powder, Apply 1 application topically 3 (three) times daily. (Patient not taking: Reported on 08/29/2022), Disp: 30 g, Rfl: 5   oxyCODONE (OXY IR/ROXICODONE) 5 MG immediate release tablet, Take 1-2 tablets (5-10 mg total) by mouth every 4 (four) hours as needed for severe  pain. (Patient not taking: Reported on 05/30/2022), Disp: 30 tablet, Rfl: 0   RABEprazole (ACIPHEX) 20 MG tablet, Take 20 mg by mouth daily before breakfast. , Disp: , Rfl:    RYBELSUS 7 MG TABS, Take 7 mg by mouth every morning. (Patient not taking: Reported on 08/29/2022), Disp: , Rfl:    tirzepatide (MOUNJARO) 10 MG/0.5ML Pen, Mounjaro 10 mg/0.5 mL subcutaneous pen injector  INJECT 10MG  SUBCUTANEOUS ONCE A WEEK 30 DAYS (Patient not taking: Reported on 08/29/2022), Disp: , Rfl:    tirzepatide (MOUNJARO) 10 MG/0.5ML Pen,  Inject 10 mg into the skin once a week. (Patient not taking: Reported on 08/29/2022), Disp: 2 mL, Rfl: 5   tirzepatide (MOUNJARO) 12.5 MG/0.5ML Pen, Inject 12.5 mg into the skin once a week. (Patient not taking: Reported on 08/29/2022), Disp: 2 mL, Rfl: 6   tirzepatide (MOUNJARO) 5 MG/0.5ML Pen, Inject 5 mg into the skin once a week., Disp: 2 mL, Rfl: 3   tirzepatide (MOUNJARO) 7.5 MG/0.5ML Pen, Inject 7.5 mg into the skin once a week. (Patient not taking: Reported on 08/29/2022), Disp: 2 mL, Rfl: 5   tirzepatide (MOUNJARO) 7.5 MG/0.5ML Pen, Inject 7.5 mg into the skin once a week., Disp: 2 mL, Rfl: 5   traMADol (ULTRAM) 50 MG tablet, Take 1-2 tablets (50-100 mg total) by mouth every 6 (six) hours as needed for moderate pain. (Patient not taking: Reported on 08/29/2022), Disp: 40 tablet, Rfl: 0   triamterene-hydrochlorothiazide (MAXZIDE-25) 37.5-25 MG tablet, Take 1 tablet by mouth daily., Disp: , Rfl:    Review of Systems  Constitutional:  Negative for activity change, appetite change, chills, diaphoresis, fatigue, fever and unexpected weight change.  HENT:  Negative for congestion, rhinorrhea, sinus pressure, sneezing, sore throat and trouble swallowing.   Eyes:  Negative for photophobia and visual disturbance.  Respiratory:  Negative for cough, chest tightness, shortness of breath, wheezing and stridor.   Cardiovascular:  Negative for chest pain, palpitations and leg swelling.  Gastrointestinal:  Negative for abdominal distention, abdominal pain, anal bleeding, blood in stool, constipation, diarrhea, nausea and vomiting.  Genitourinary:  Negative for difficulty urinating, dysuria, flank pain and hematuria.  Musculoskeletal:  Negative for arthralgias, back pain, gait problem, joint swelling and myalgias.  Skin:  Negative for color change, pallor, rash and wound.  Neurological:  Negative for dizziness, tremors, weakness and light-headedness.  Hematological:  Negative for adenopathy. Does not  bruise/bleed easily.  Psychiatric/Behavioral:  Negative for agitation, behavioral problems, confusion, decreased concentration, dysphoric mood and sleep disturbance.        Objective:   Physical Exam Constitutional:      Appearance: He is well-developed.  HENT:     Head: Normocephalic and atraumatic.  Eyes:     Conjunctiva/sclera: Conjunctivae normal.  Cardiovascular:     Rate and Rhythm: Normal rate and regular rhythm.  Pulmonary:     Effort: Pulmonary effort is normal. No respiratory distress.     Breath sounds: No wheezing.  Abdominal:     General: There is no distension.     Palpations: Abdomen is soft.  Musculoskeletal:        General: No tenderness. Normal range of motion.     Cervical back: Normal range of motion and neck supple.  Skin:    General: Skin is warm and dry.     Coloration: Skin is not pale.     Findings: No erythema or rash.  Neurological:     General: No focal deficit present.     Mental  Status: He is alert and oriented to person, place, and time.  Psychiatric:        Mood and Affect: Mood normal.        Behavior: Behavior normal.        Thought Content: Thought content normal.        Judgment: Judgment normal.     Right knee 05/17/2021:        Knee 09/18/2021:    Assessment & Plan:  Prosthetic joint infection:  He understands that if we cannot suppress his infection with chronic antibiotics he will need either effusion or an above-the-knee amputation.  Will check sed rate CRP CBC with differential and CMP  Will continue levofloxacin and follow-up in 6 months time.  Rheumatoid thrice: Will check labs including LFTs per Dr. Maryln Gottron request.  He is continue on methotrexate.  Vaccine counseling recommended COVID and influenza vaccination which she declined Prosthetic joint infection:

## 2023-09-05 LAB — CBC WITH DIFFERENTIAL/PLATELET
Absolute Lymphocytes: 973 {cells}/uL (ref 850–3900)
Absolute Monocytes: 128 {cells}/uL — ABNORMAL LOW (ref 200–950)
Basophils Absolute: 32 {cells}/uL (ref 0–200)
Basophils Relative: 0.5 %
Eosinophils Absolute: 211 {cells}/uL (ref 15–500)
Eosinophils Relative: 3.3 %
HCT: 43.1 % (ref 38.5–50.0)
Hemoglobin: 14.3 g/dL (ref 13.2–17.1)
MCH: 30.2 pg (ref 27.0–33.0)
MCHC: 33.2 g/dL (ref 32.0–36.0)
MCV: 91.1 fL (ref 80.0–100.0)
MPV: 9.1 fL (ref 7.5–12.5)
Monocytes Relative: 2 %
Neutro Abs: 5056 {cells}/uL (ref 1500–7800)
Neutrophils Relative %: 79 %
Platelets: 295 10*3/uL (ref 140–400)
RBC: 4.73 10*6/uL (ref 4.20–5.80)
RDW: 13.3 % (ref 11.0–15.0)
Total Lymphocyte: 15.2 %
WBC: 6.4 10*3/uL (ref 3.8–10.8)

## 2023-09-05 LAB — COMPLETE METABOLIC PANEL WITH GFR
AG Ratio: 1.2 (calc) (ref 1.0–2.5)
ALT: 9 U/L (ref 9–46)
AST: 17 U/L (ref 10–35)
Albumin: 3.3 g/dL — ABNORMAL LOW (ref 3.6–5.1)
Alkaline phosphatase (APISO): 68 U/L (ref 35–144)
BUN: 14 mg/dL (ref 7–25)
CO2: 27 mmol/L (ref 20–32)
Calcium: 9 mg/dL (ref 8.6–10.3)
Chloride: 107 mmol/L (ref 98–110)
Creat: 0.95 mg/dL (ref 0.70–1.35)
Globulin: 2.7 g/dL (ref 1.9–3.7)
Glucose, Bld: 96 mg/dL (ref 65–99)
Potassium: 4.1 mmol/L (ref 3.5–5.3)
Sodium: 139 mmol/L (ref 135–146)
Total Bilirubin: 0.6 mg/dL (ref 0.2–1.2)
Total Protein: 6 g/dL — ABNORMAL LOW (ref 6.1–8.1)
eGFR: 90 mL/min/{1.73_m2} (ref >=60)

## 2023-09-05 LAB — C-REACTIVE PROTEIN: CRP: 22.7 mg/L — ABNORMAL HIGH (ref <8.0)

## 2023-09-05 LAB — SEDIMENTATION RATE: Sed Rate: 22 mm/h — ABNORMAL HIGH (ref 0–20)

## 2023-10-11 DIAGNOSIS — E611 Iron deficiency: Secondary | ICD-10-CM | POA: Diagnosis not present

## 2023-10-11 DIAGNOSIS — Z0000 Encounter for general adult medical examination without abnormal findings: Secondary | ICD-10-CM | POA: Diagnosis not present

## 2023-10-11 DIAGNOSIS — E785 Hyperlipidemia, unspecified: Secondary | ICD-10-CM | POA: Diagnosis not present

## 2023-10-11 DIAGNOSIS — Z125 Encounter for screening for malignant neoplasm of prostate: Secondary | ICD-10-CM | POA: Diagnosis not present

## 2023-10-11 DIAGNOSIS — I10 Essential (primary) hypertension: Secondary | ICD-10-CM | POA: Diagnosis not present

## 2023-10-11 DIAGNOSIS — D509 Iron deficiency anemia, unspecified: Secondary | ICD-10-CM | POA: Diagnosis not present

## 2023-10-11 DIAGNOSIS — E1121 Type 2 diabetes mellitus with diabetic nephropathy: Secondary | ICD-10-CM | POA: Diagnosis not present

## 2023-11-25 DIAGNOSIS — G4733 Obstructive sleep apnea (adult) (pediatric): Secondary | ICD-10-CM | POA: Diagnosis not present

## 2023-11-26 DIAGNOSIS — G4733 Obstructive sleep apnea (adult) (pediatric): Secondary | ICD-10-CM | POA: Diagnosis not present

## 2023-12-13 DIAGNOSIS — M0579 Rheumatoid arthritis with rheumatoid factor of multiple sites without organ or systems involvement: Secondary | ICD-10-CM | POA: Diagnosis not present

## 2023-12-13 DIAGNOSIS — Z79899 Other long term (current) drug therapy: Secondary | ICD-10-CM | POA: Diagnosis not present

## 2023-12-13 DIAGNOSIS — M1991 Primary osteoarthritis, unspecified site: Secondary | ICD-10-CM | POA: Diagnosis not present

## 2023-12-13 DIAGNOSIS — Z6841 Body Mass Index (BMI) 40.0 and over, adult: Secondary | ICD-10-CM | POA: Diagnosis not present

## 2023-12-26 DIAGNOSIS — Z8619 Personal history of other infectious and parasitic diseases: Secondary | ICD-10-CM | POA: Diagnosis not present

## 2023-12-26 DIAGNOSIS — M25461 Effusion, right knee: Secondary | ICD-10-CM | POA: Diagnosis not present

## 2024-01-27 DIAGNOSIS — D485 Neoplasm of uncertain behavior of skin: Secondary | ICD-10-CM | POA: Diagnosis not present

## 2024-01-27 DIAGNOSIS — C44329 Squamous cell carcinoma of skin of other parts of face: Secondary | ICD-10-CM | POA: Diagnosis not present

## 2024-02-17 ENCOUNTER — Ambulatory Visit: Payer: Medicare HMO | Admitting: Infectious Disease

## 2024-02-22 NOTE — Progress Notes (Deleted)
 Subjective:  Chief complaint: Follow-up for prosthetic joint infection   patient ID: [name], male    DOB: Aug 02, 1961, 63 y.o.   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  He then had prostghetic joint infection with Staphylococcus lugdunensis and (methicillin sensitive in 2017 which was treated with daptomycin  along with rifampin  after I&D and polyexchange.  He apparently developed a vancomycin  allergy at that time.  He completed 6 weeks of therapy and then stopped.  He then returned in 2017 with knee pain and swelling and now was found again to have prosthetic joint infection was taken to the operating room but nothing grew from culture and he was treated for 6 weeks with daptomycin  after I&D and polyexchange he is followed by Dr. Seymour Dapper and placed on Augmentin  in February 2018 and completed at least 5 months of oral therapy.  He then underwent additional I&D with pollex change in November 2018 recurrent right knee infection again with negative cultures.  Then had aspirate the knee that grew Citrobacter koseri that was fairly sensitive and underwent another I&D with polyexchange in May 2021.  He was treated with protracted oral ciprofloxacin  but was having loosening of his prosthesis and was concern for infection and underwent I&D with resection arthroplasty now with an antibiotic spacer placed in September 2021.  OR cultures now yielded ampicillin  sensitive Enterococcus faecalis.  He completed 6 weeks of IV Zosyn on October 22.  After that he was placed on Augmentin  875 125 twice daily until February 2.  It was then stopped and he ultimately underwent reimplantation of right total knee arthroplasty on December 07, 2020.  Cultures in the OR at the time of surgery were without growth.  He was doing well until few days prior to admission this June 2022 he was admitted by orthopedics due to concerns for recurrence of prosthetic joint infection.  He was having increased discomfort drainage from an anterior aspect of the knee.  He underwent I&D by Dr. Rossie Coon on June 22.  Cultures done in the  OR grew Pasteurella species.  Patient had been in the interim discharged on daptomycin  and ceftriaxone  prior to cultures resulting.  He was seen and followed by Dr.Manandhar and changed to continuous dose ampicillin  the patient completed on followed with oral amoxicillin .   In asking him closely at of his visits he told me he did  have a boxer that likes to lick his feet and also likes to jump into his lap frequently and sometimes scratches his skin.  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  I went to great lengths  several visits ago to explain that Pasteurella is a normal part of the oral flora of dogs and that the dog is not unhealthy and is in no way doing anything wrong with his behaviors though I did caution him to try to protect his skin and in particular his wounds from being scratched by his dog.  Seen in January by Dr. Rossie Coon who rated his knee and obtained cell count differential I do not have access to as well as a culture that grew a staphylococcal species that was sensitive "against all antibiotics  against present was tested.  Amoxicillin  indeed would NOT be active vs even MSSA. He was switched to ciprofloxacin  and was experiencing less pain in the knee since then.   I obtained sensitivities on the organism and it was a Staphylococcus pseudo intermedius that was sensitive to oxacillin.    I hanged the patient over to Augmentin .  He has been following along with Dr.Alusio and not had further drainage of his  knee.  Knee pain appeared to have been improving.  Unfortunately in the interim he has developed worsening swelling of the knee.  He underwent sterile aspiration of the knee which revealed "regular fluid per Dr. Ellouise Haagensen note.  Unfortunately the cultures from this aspirate have yielded an extended spectrum beta-lactamase producing Klebsiella species which curiously is reported as being sensitive to levofloxacin  but resistant to ciprofloxacin  sensitive to Bactrim and carbapenems but otherwise resistant.     He has been on levofloxacin  and tolerated this.   Int the interm he had aspirate of the knee performed and this showed 13,806 white blood cells and 97% neutrophils which is disconcerting.  Cultures did not yield an organism.        Over the last several visits his knee pain and swelling has been relatively stable he has not required drainage of fluid by Dr. Rossie Coon.  He is seeing Dr. Meredith Stalls for his rheumatoid arthritis now   Apparently she wants LFTs checked with her his meds chronically so we are adding LFTs to our labs that we routinely check with for [facility].  His knee painhad not him worsened.  He has seen Dr. Rossie Coon who drew fluid off his knee earlier this month but did not sent for fluid analysis or culture.  [name] states that Dr.  Rossie Coon felt the fluid did not look infected.  When I last saw him in May 24   I last saw [name] he has not had any further aspirations of his knee by Dr. Rossie Coon.  His knee pain is currently stable worsening when he has accumulation of fluid in the joint  Is following with Dr. Stefan Edge and taking methotrexate  for his rheumatoid arthritis    Past Medical History:  Diagnosis Date   Allergy    OCC    Anemia    hx of   Asthma    Cataract    small- forming    Cellulitis of lower extremity    "usually RLL; this time it's got up to my lower abdoment" (02/11/2014)-series of antibiotics completed 02-28-14   Diverticulosis    Edema     legs   ESBL (extended spectrum beta-lactamase) producing bacteria infection 04/11/2022   GERD (gastroesophageal reflux disease)    GERD (gastroesophageal reflux disease) 04/11/2022   Hypertension    Infection of right knee (HCC)    OSA (obstructive sleep apnea)    does not use cpap    Pasteurella infection 05/17/2021   Pneumonia    20+ years ago   PONV (postoperative nausea and vomiting)    Rheumatoid arthritis (HCC) dx'd ~ 1977   S/P PICC central line placement 03-16-14   right upper arm remains intact.05-11-14 removed 1 week ago.   Sleep apnea    no cpap     Past Surgical History:  Procedure Laterality Date   COLONOSCOPY  2012   EXCISIONAL HEMORRHOIDECTOMY     EXCISIONAL TOTAL KNEE ARTHROPLASTY WITH ANTIBIOTIC SPACERS Right 03/18/2014   Procedure: RIGHT KNEE RESECTION ARTHROPLASTY WITH ANTIBIOTIC SPACERS;  Surgeon: Aurther Blue, MD;  Location: [facility];  Service: Orthopedics;  Laterality: Right;   EXCISIONAL TOTAL KNEE ARTHROPLASTY WITH ANTIBIOTIC SPACERS Right 06/22/2020   Procedure: Right knee resection arthroplasty, antibiotic spacer;  Surgeon: Liliane Rei, MD;  Location: [facility];  Service: Orthopedics;  Laterality: Right;    FOREIGN BODY REMOVAL Left    "BB" removal above left eye-teen yrs.   HERNIA REPAIR     I & D KNEE WITH POLY EXCHANGE Right 04/25/2016   Procedure: RIGHT KNEE IRRIGATION AND DEBRIDEMENT WITH POLY EXCHANGE;  Surgeon: Liliane Rei, MD;  Location: [facility];  Service: Orthopedics;  Laterality: Right;   I & D KNEE WITH POLY EXCHANGE Right 10/01/2016   Procedure: IRRIGATION AND DEBRIDEMENT KNEE WITH POLY EXCHANGE;  Surgeon: Liliane Rei, MD;  Location: [facility];  Service: Orthopedics;  Laterality: Right;   I & D KNEE WITH POLY EXCHANGE Right 08/26/2017   Procedure: IRRIGATION AND DEBRIDEMENT KNEE WITH POLY EXCHANGE;  Surgeon: Liliane Rei, MD;  Location: [facility];  Service: Orthopedics;  Laterality: Right;   I & D KNEE WITH POLY EXCHANGE Right 11/16/2019    Procedure: IRRIGATION AND DEBRIDEMENT KNEE WITH POLY EXCHANGE;  Surgeon: Liliane Rei, MD;  Location: [facility];  Service: Orthopedics;  Laterality: Right;    I & D KNEE WITH POLY EXCHANGE Right 02/22/2020   Procedure: Right knee irrigation and debridement;  Surgeon: Liliane Rei, MD;  Location: [facility];  Service: Orthopedics;  Laterality: Right;    INCISION AND DRAINAGE Right 09/28/2014   Procedure: INCISION AND DRAINAGE RIGHT KNEE;  Surgeon: Aurther Blue, MD;  Location: [facility];  Service: Orthopedics;  Laterality: Right;   IR FLUORO GUIDE CV LINE RIGHT  04/07/2021   IR REMOVAL TUN CV CATH W/O FL  05/17/2021   IR US  GUIDE VASC ACCESS RIGHT  04/07/2021   IRRIGATION AND DEBRIDEMENT KNEE Right 04/05/2021   Procedure: IRRIGATION AND DEBRIDEMENT KNEE;  Surgeon: Liliane Rei, MD;  Location: [facility];  Service: Orthopedics;  Laterality: Right;   KNEE BURSECTOMY Right 03/13/2017   Procedure: RIGHT KNEE PREPATELLAR BURSECTOMY;  Surgeon: Liliane Rei, MD;  Location: [facility];  Service: Orthopedics;  Laterality: Right;   picc line placement Right    right upper arm   REIMPLANTATION OF TOTAL KNEE Right 12/07/2020   Procedure: REIMPLANTATION OF RIGHT TOTAL KNEE;  Surgeon: Liliane Rei, MD;  Location: [facility];  Service: Orthopedics;  Laterality: Right;  2 hrs   REPLACEMENT TOTAL HIP W/  RESURFACING IMPLANTS Bilateral 01/2001; 08/2010   "left; right"   REPLACEMENT TOTAL KNEE BILATERAL Bilateral 10/1991; 10/2006   "right; left"   REVISION TOTAL KNEE ARTHROPLASTY Right 2012   SPIGELIAN HERNIA Right 12/04/2015   Procedure: INCARCERATED SPIGELIAN HERNIA REPAIR WITH MESH;  Surgeon: Dorena Gander, MD;  Location[facility];  Service: General;  Laterality: Right;   TOTAL KNEE ARTHROPLASTY Right 05/21/2014   Procedure: RIGHT KNEE ARTHROPLASTY REINPLANTATION;  Surgeon: Aurther Blue, MD;  Location: [facility];  Service: Orthopedics;  Laterality: Right;    Family History  Problem Relation Age of Onset   Colon cancer  Mother 26       passed age 21 - stage 4    Hypertension Mother    Diabetes Mellitus II Paternal Grandmother    Colon polyps Neg Hx    Esophageal cancer Neg Hx    Stomach cancer Neg Hx    Rectal cancer Neg Hx       Social History   Socioeconomic History   Marital  status: Married    Spouse name: Not on file   Number of children: 1   Years of education: 14   Highest education level: Not on file  Occupational History   Not on file  Tobacco Use   Smoking status: Former    Current packs/day: 0.00    Average packs/day: 1 pack/day for 3.0 years (3.0 ttl pk-yrs)    Types: Cigarettes    Start date: 03/16/1978    Quit date: 03/16/1981    Years since quitting: 42.9   Smokeless tobacco: Former    Types: Snuff   Tobacco comments:    "quit smoking cigarettes in the 1980's"  Vaping Use   Vaping status: Never Used  Substance and Sexual Activity   Alcohol use: No   Drug use: No   Sexual activity: Yes    Partners: Male  Other Topics Concern   Not on file  Social History Narrative   Not on file   Social Drivers of Health   Financial Resource Strain: Not on file  Food Insecurity: Not on file  Transportation Needs: Not on file  Physical Activity: Not on file  Stress: Not on file  Social Connections: Not on file    Allergies  Allergen Reactions   Avelox [Moxifloxacin Hcl In Nacl] Hives, Shortness Of Breath and Itching    Tolerates Cipro    Chlorhexidine      Skin red and burning after skin wash   Doxycycline  Itching   Sulfa Antibiotics Itching   Vancomycin  Hives and Itching   E.E.S. [Erythromycin] Rash   Erythrocin Rash   Sulfasalazine Rash     Current Outpatient Medications:    cholecalciferol (VITAMIN D3) 25 MCG (1000 UNIT) tablet, Take 1,000 Units by mouth daily. (Patient not taking: Reported on 09/04/2023), Disp: , Rfl:    diltiazem  (CARDIZEM  CD) 180 MG 24 hr capsule, Take 1 capsule (180 mg total) by mouth daily., Disp: 30 capsule, Rfl: 2   diltiazem  (TIAZAC ) 180 MG 24  hr capsule, , Disp: , Rfl:    DILTIAZEM  HCL ER PO, Take 180 mg by mouth daily. (Patient not taking: Reported on 03/12/2023), Disp: , Rfl:    Fluticasone -Salmeterol (ADVAIR) 250-50 MCG/DOSE AEPB, Inhale 1 puff into the lungs daily., Disp: , Rfl:    folic acid  (FOLVITE ) 1 MG tablet, Take 1 mg by mouth daily., Disp: , Rfl:    Ipratropium-Albuterol  (COMBIVENT  RESPIMAT) 20-100 MCG/ACT AERS respimat, Inhale 1 puff into the lungs every 6 (six) hours as needed for wheezing or shortness of breath., Disp: , Rfl:    losartan  (COZAAR ) 100 MG tablet, Take 100 mg by mouth daily. , Disp: , Rfl:    methocarbamol  (ROBAXIN ) 500 MG tablet, Take 1 tablet (500 mg total) by mouth every 6 (six) hours as needed for muscle spasms., Disp: 40 tablet, Rfl: 0   methotrexate  (RHEUMATREX) 2.5 MG tablet, Take 10 mg by mouth every Monday. Caution:Chemotherapy. Protect from light., Disp: , Rfl:    MOUNJARO  5 MG/0.5ML Pen, , Disp: , Rfl:    Multiple Vitamin (MULTIVITAMIN WITH MINERALS) TABS tablet, Take 1 tablet by mouth daily., Disp: , Rfl:    nystatin  (MYCOSTATIN /NYSTOP ) powder, Apply 1 application topically 3 (three) times daily., Disp: 30 g, Rfl: 5   oxyCODONE  (OXY IR/ROXICODONE ) 5 MG immediate release tablet, Take 1-2 tablets (5-10 mg total) by mouth every 4 (four) hours as needed for severe pain. (Patient not taking: Reported on 05/30/2022), Disp: 30 tablet, Rfl: 0   RABEprazole (ACIPHEX) 20 MG tablet, Take 20  mg by mouth daily before breakfast. , Disp: , Rfl:    RYBELSUS  7 MG TABS, Take 7 mg by mouth every morning. (Patient not taking: Reported on 08/29/2022), Disp: , Rfl:    tirzepatide  (MOUNJARO ) 10 MG/0.5ML Pen, Mounjaro  10 mg/0.5 mL subcutaneous pen injector  INJECT 10MG  SUBCUTANEOUS ONCE A WEEK 30 DAYS (Patient not taking: Reported on 08/29/2022), Disp: , Rfl:    tirzepatide  (MOUNJARO ) 10 MG/0.5ML Pen, Inject 10 mg into the skin once a week. (Patient not taking: Reported on 08/29/2022), Disp: 2 mL, Rfl: 5   tirzepatide   (MOUNJARO ) 12.5 MG/0.5ML Pen, Inject 12.5 mg into the skin once a week. (Patient not taking: Reported on 08/29/2022), Disp: 2 mL, Rfl: 6   tirzepatide  (MOUNJARO ) 5 MG/0.5ML Pen, Inject 5 mg into the skin once a week. (Patient not taking: Reported on 09/04/2023), Disp: 2 mL, Rfl: 3   tirzepatide  (MOUNJARO ) 7.5 MG/0.5ML Pen, Inject 7.5 mg into the skin once a week. (Patient not taking: Reported on 08/29/2022), Disp: 2 mL, Rfl: 5   tirzepatide  (MOUNJARO ) 7.5 MG/0.5ML Pen, Inject 7.5 mg into the skin once a week. (Patient not taking: Reported on 09/04/2023), Disp: 2 mL, Rfl: 5   traMADol  (ULTRAM ) 50 MG tablet, Take 1-2 tablets (50-100 mg total) by mouth every 6 (six) hours as needed for moderate pain., Disp: 40 tablet, Rfl: 0   triamterene -hydrochlorothiazide  (MAXZIDE -25) 37.5-25 MG tablet, Take 1 tablet by mouth daily., Disp: , Rfl:    Review of Systems  Constitutional:  Negative for activity change, appetite change, chills, diaphoresis, fatigue, fever and unexpected weight change.  HENT:  Negative for congestion, rhinorrhea, sinus pressure, sneezing, sore throat and trouble swallowing.   Eyes:  Negative for photophobia and visual disturbance.  Respiratory:  Negative for cough, chest tightness, shortness of breath, wheezing and stridor.   Cardiovascular:  Negative for chest pain, palpitations and leg swelling.  Gastrointestinal:  Negative for abdominal distention, abdominal pain, anal bleeding, blood in stool, constipation, diarrhea, nausea and vomiting.  Genitourinary:  Negative for difficulty urinating, dysuria, flank pain and hematuria.  Musculoskeletal:  Negative for arthralgias, back pain, gait problem, joint swelling and myalgias.  Skin:  Negative for color change, pallor, rash and wound.  Neurological:  Negative for dizziness, tremors, weakness and light-headedness.  Hematological:  Negative for adenopathy. Does not bruise/bleed easily.  Psychiatric/Behavioral:  Negative for agitation,  behavioral problems, confusion, decreased concentration, dysphoric mood and sleep disturbance.        Objective:   Physical Exam Constitutional:      Appearance: He is well-developed.  HENT:     Head: Normocephalic and atraumatic.  Eyes:     Conjunctiva/sclera: Conjunctivae normal.  Cardiovascular:     Rate and Rhythm: Normal rate and regular rhythm.  Pulmonary:     Effort: Pulmonary effort is normal. No respiratory distress.     Breath sounds: No wheezing.  Abdominal:     General: There is no distension.     Palpations: Abdomen is soft.  Musculoskeletal:        General: No tenderness. Normal range of motion.     Cervical back: Normal range of motion and neck supple.  Skin:    General: Skin is warm and dry.     Coloration: Skin is not pale.     Findings: No erythema or rash.  Neurological:     General: No focal deficit present.     Mental Status: He is alert and oriented to person, place, and time.  Psychiatric:  Mood and Affect: Mood normal.        Behavior: Behavior normal.        Thought Content: Thought content normal.        Judgment: Judgment normal.     Right knee 05/17/2021:        Knee 09/18/2021:    Assessment & Plan:  Prosthetic joint infection:  He understands that if we cannot suppress his infection with chronic antibiotics he will need either effusion or an above-the-knee amputation.  Will check sed rate CRP CBC with differential and CMP  Will continue levofloxacin  and follow-up in 6 months time.  Rheumatoid thrice: Will check labs including LFTs per Dr. Kita Perish request.  He is continue on methotrexate .  Vaccine counseling recommended COVID and influenza vaccination which she declined Prosthetic joint infection:

## 2024-02-24 ENCOUNTER — Ambulatory Visit: Payer: Medicare HMO | Admitting: Infectious Disease

## 2024-02-24 DIAGNOSIS — T8453XD Infection and inflammatory reaction due to internal right knee prosthesis, subsequent encounter: Secondary | ICD-10-CM

## 2024-02-24 DIAGNOSIS — M069 Rheumatoid arthritis, unspecified: Secondary | ICD-10-CM

## 2024-02-24 DIAGNOSIS — Z1612 Extended spectrum beta lactamase (ESBL) resistance: Secondary | ICD-10-CM

## 2024-02-24 NOTE — Progress Notes (Deleted)
 Subjective:  Chief complaint: Follow-up for prosthetic joint infection   patient ID: [name], male    DOB: 1961/07/28, 63 y.o.   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  He then had prostghetic joint infection with Staphylococcus lugdunensis and (methicillin sensitive in 2017 which was treated with daptomycin  along with rifampin  after I&D and polyexchange.  He apparently developed a vancomycin  allergy at that time.  He completed 6 weeks of therapy and then stopped.  He then returned in 2017 with knee pain and swelling and now was found again to have prosthetic joint infection was taken to the operating room but nothing grew from culture and he was treated for 6 weeks with daptomycin  after I&D and polyexchange he is followed by Dr. Seymour Dapper and placed on Augmentin  in February 2018 and completed at least 5 months of oral therapy.  He then underwent additional I&D with pollex change in November 2018 recurrent right knee infection again with negative cultures.  Then had aspirate the knee that grew Citrobacter koseri that was fairly sensitive and underwent another I&D with polyexchange in May 2021.  He was treated with protracted oral ciprofloxacin  but was having loosening of his prosthesis and was concern for infection and underwent I&D with resection arthroplasty now with an antibiotic spacer placed in September 2021.  OR cultures now yielded ampicillin  sensitive Enterococcus faecalis.  He completed 6 weeks of IV Zosyn on October 22.  After that he was placed on Augmentin  875 125 twice daily until February 2.  It was then stopped and he ultimately underwent reimplantation of right total knee arthroplasty on December 07, 2020.  Cultures in the OR at the time of surgery were without growth.  He was doing well until few days prior to admission this June 2022 he was admitted by orthopedics due to concerns for recurrence of prosthetic joint infection.  He was having increased discomfort drainage from an anterior aspect of the knee.  He underwent I&D by Dr. Rossie Coon on June 22.  Cultures done in the  OR grew Pasteurella species.  Patient had been in the interim discharged on daptomycin  and ceftriaxone  prior to cultures resulting.  He was seen and followed by Dr.Manandhar and changed to continuous dose ampicillin  the patient completed on followed with oral amoxicillin .   In asking him closely at of his visits he told me he did  have a boxer that likes to lick his feet and also likes to jump into his lap frequently and sometimes scratches his skin.  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  I went to great lengths  several visits ago to explain that Pasteurella is a normal part of the oral flora of dogs and that the dog is not unhealthy and is in no way doing anything wrong with his behaviors though I did caution him to try to protect his skin and in particular his wounds from being scratched by his dog.  Seen in January by Dr. Rossie Coon who rated his knee and obtained cell count differential I do not have access to as well as a culture that grew a staphylococcal species that was sensitive "against all antibiotics  against present was tested.  Amoxicillin  indeed would NOT be active vs even MSSA. He was switched to ciprofloxacin  and was experiencing less pain in the knee since then.   I obtained sensitivities on the organism and it was a Staphylococcus pseudo intermedius that was sensitive to oxacillin.    I hanged the patient over to Augmentin .  He has been following along with Dr.Alusio and not had further drainage of his  knee.  Knee pain appeared to have been improving.  Unfortunately in the interim he has developed worsening swelling of the knee.  He underwent sterile aspiration of the knee which revealed "regular fluid per Dr. Ellouise Haagensen note.  Unfortunately the cultures from this aspirate have yielded an extended spectrum beta-lactamase producing Klebsiella species which curiously is reported as being sensitive to levofloxacin  but resistant to ciprofloxacin  sensitive to Bactrim and carbapenems but otherwise resistant.     He has been on levofloxacin  and tolerated this.   Int the interm he had aspirate of the knee performed and this showed 13,806 white blood cells and 97% neutrophils which is disconcerting.  Cultures did not yield an organism.        Over the last several visits his knee pain and swelling has been relatively stable he has not required drainage of fluid by Dr. Rossie Coon.  He is seeing Dr. Meredith Stalls for his rheumatoid arthritis now   Apparently she wants LFTs checked with her his meds chronically so we are adding LFTs to our labs that we routinely check with for [facility].  His knee painhad not him worsened.  He has seen Dr. Rossie Coon who drew fluid off his knee earlier this month but did not sent for fluid analysis or culture.  Bernal states that Dr.  Rossie Coon felt the fluid did not look infected.  When I last saw him in May 24   I last saw Kamarien he has not had any further aspirations of his knee by Dr. Rossie Coon.  His knee pain is currently stable worsening when he has accumulation of fluid in the joint  Is following with Dr. Stefan Edge and taking methotrexate  for his rheumatoid arthritis    Past Medical History:  Diagnosis Date   Allergy    OCC    Anemia    hx of   Asthma    Cataract    small- forming    Cellulitis of lower extremity    "usually RLL; this time it's got up to my lower abdoment" (02/11/2014)-series of antibiotics completed 02-28-14   Diverticulosis    Edema     legs   ESBL (extended spectrum beta-lactamase) producing bacteria infection 04/11/2022   GERD (gastroesophageal reflux disease)    GERD (gastroesophageal reflux disease) 04/11/2022   Hypertension    Infection of right knee (HCC)    OSA (obstructive sleep apnea)    does not use cpap    Pasteurella infection 05/17/2021   Pneumonia    20+ years ago   PONV (postoperative nausea and vomiting)    Rheumatoid arthritis (HCC) dx'd ~ 1977   S/P PICC central line placement 03-16-14   right upper arm remains intact.05-11-14 removed 1 week ago.   Sleep apnea    no cpap     Past Surgical History:  Procedure Laterality Date   COLONOSCOPY  2012   EXCISIONAL HEMORRHOIDECTOMY     EXCISIONAL TOTAL KNEE ARTHROPLASTY WITH ANTIBIOTIC SPACERS Right 03/18/2014   Procedure: RIGHT KNEE RESECTION ARTHROPLASTY WITH ANTIBIOTIC SPACERS;  Surgeon: Aurther Blue, MD;  Location: [facility];  Service: Orthopedics;  Laterality: Right;   EXCISIONAL TOTAL KNEE ARTHROPLASTY WITH ANTIBIOTIC SPACERS Right 06/22/2020   Procedure: Right knee resection arthroplasty, antibiotic spacer;  Surgeon: Liliane Rei, MD;  Location: [facility];  Service: Orthopedics;  Laterality: Right;    FOREIGN BODY REMOVAL Left    "BB" removal above left eye-teen yrs.   HERNIA REPAIR     I & D KNEE WITH POLY EXCHANGE Right 04/25/2016   Procedure: RIGHT KNEE IRRIGATION AND DEBRIDEMENT WITH POLY EXCHANGE;  Surgeon: Liliane Rei, MD;  Location: [facility];  Service: Orthopedics;  Laterality: Right;   I & D KNEE WITH POLY EXCHANGE Right 10/01/2016   Procedure: IRRIGATION AND DEBRIDEMENT KNEE WITH POLY EXCHANGE;  Surgeon: Liliane Rei, MD;  Location: [facility];  Service: Orthopedics;  Laterality: Right;   I & D KNEE WITH POLY EXCHANGE Right 08/26/2017   Procedure: IRRIGATION AND DEBRIDEMENT KNEE WITH POLY EXCHANGE;  Surgeon: Liliane Rei, MD;  Location: [facility];  Service: Orthopedics;  Laterality: Right;   I & D KNEE WITH POLY EXCHANGE Right 11/16/2019    Procedure: IRRIGATION AND DEBRIDEMENT KNEE WITH POLY EXCHANGE;  Surgeon: Liliane Rei, MD;  Location: [facility];  Service: Orthopedics;  Laterality: Right;    I & D KNEE WITH POLY EXCHANGE Right 02/22/2020   Procedure: Right knee irrigation and debridement;  Surgeon: Liliane Rei, MD;  Location: [facility];  Service: Orthopedics;  Laterality: Right;    INCISION AND DRAINAGE Right 09/28/2014   Procedure: INCISION AND DRAINAGE RIGHT KNEE;  Surgeon: Aurther Blue, MD;  Location: [facility];  Service: Orthopedics;  Laterality: Right;   IR FLUORO GUIDE CV LINE RIGHT  04/07/2021   IR REMOVAL TUN CV CATH W/O FL  05/17/2021   IR US  GUIDE VASC ACCESS RIGHT  04/07/2021   IRRIGATION AND DEBRIDEMENT KNEE Right 04/05/2021   Procedure: IRRIGATION AND DEBRIDEMENT KNEE;  Surgeon: Liliane Rei, MD;  Location: [facility];  Service: Orthopedics;  Laterality: Right;   KNEE BURSECTOMY Right 03/13/2017   Procedure: RIGHT KNEE PREPATELLAR BURSECTOMY;  Surgeon: Liliane Rei, MD;  Location: [facility];  Service: Orthopedics;  Laterality: Right;   picc line placement Right    right upper arm   REIMPLANTATION OF TOTAL KNEE Right 12/07/2020   Procedure: REIMPLANTATION OF RIGHT TOTAL KNEE;  Surgeon: Liliane Rei, MD;  Location: [facility];  Service: Orthopedics;  Laterality: Right;  2 hrs   REPLACEMENT TOTAL HIP W/  RESURFACING IMPLANTS Bilateral 01/2001; 08/2010   "left; right"   REPLACEMENT TOTAL KNEE BILATERAL Bilateral 10/1991; 10/2006   "right; left"   REVISION TOTAL KNEE ARTHROPLASTY Right 2012   SPIGELIAN HERNIA Right 12/04/2015   Procedure: INCARCERATED SPIGELIAN HERNIA REPAIR WITH MESH;  Surgeon: Dorena Gander, MD;  Location: [facility];  Service: General;  Laterality: Right;   TOTAL KNEE ARTHROPLASTY Right 05/21/2014   Procedure: RIGHT KNEE ARTHROPLASTY REINPLANTATION;  Surgeon: Aurther Blue, MD;  Location: [facility];  Service: Orthopedics;  Laterality: Right;    Family History  Problem Relation Age of Onset   Colon cancer  Mother 87       passed age 50 - stage 4    Hypertension Mother    Diabetes Mellitus II Paternal Grandmother    Colon polyps Neg Hx    Esophageal cancer Neg Hx    Stomach cancer Neg Hx    Rectal cancer Neg Hx       Social History   Socioeconomic History   Marital  status: Married    Spouse name: Not on file   Number of children: 1   Years of education: 14   Highest education level: Not on file  Occupational History   Not on file  Tobacco Use   Smoking status: Former    Current packs/day: 0.00    Average packs/day: 1 pack/day for 3.0 years (3.0 ttl pk-yrs)    Types: Cigarettes    Start date: 03/16/1978    Quit date: 03/16/1981    Years since quitting: 42.9   Smokeless tobacco: Former    Types: Snuff   Tobacco comments:    "quit smoking cigarettes in the 1980's"  Vaping Use   Vaping status: Never Used  Substance and Sexual Activity   Alcohol use: No   Drug use: No   Sexual activity: Yes    Partners: Male  Other Topics Concern   Not on file  Social History Narrative   Not on file   Social Drivers of Health   Financial Resource Strain: Not on file  Food Insecurity: Not on file  Transportation Needs: Not on file  Physical Activity: Not on file  Stress: Not on file  Social Connections: Not on file    Allergies  Allergen Reactions   Avelox [Moxifloxacin Hcl In Nacl] Hives, Shortness Of Breath and Itching    Tolerates Cipro    Chlorhexidine      Skin red and burning after skin wash   Doxycycline  Itching   Sulfa Antibiotics Itching   Vancomycin  Hives and Itching   E.E.S. [Erythromycin] Rash   Erythrocin Rash   Sulfasalazine Rash     Current Outpatient Medications:    cholecalciferol (VITAMIN D3) 25 MCG (1000 UNIT) tablet, Take 1,000 Units by mouth daily. (Patient not taking: Reported on 09/04/2023), Disp: , Rfl:    diltiazem  (CARDIZEM  CD) 180 MG 24 hr capsule, Take 1 capsule (180 mg total) by mouth daily., Disp: 30 capsule, Rfl: 2   diltiazem  (TIAZAC ) 180 MG 24  hr capsule, , Disp: , Rfl:    DILTIAZEM  HCL ER PO, Take 180 mg by mouth daily. (Patient not taking: Reported on 03/12/2023), Disp: , Rfl:    Fluticasone -Salmeterol (ADVAIR) 250-50 MCG/DOSE AEPB, Inhale 1 puff into the lungs daily., Disp: , Rfl:    folic acid  (FOLVITE ) 1 MG tablet, Take 1 mg by mouth daily., Disp: , Rfl:    Ipratropium-Albuterol  (COMBIVENT  RESPIMAT) 20-100 MCG/ACT AERS respimat, Inhale 1 puff into the lungs every 6 (six) hours as needed for wheezing or shortness of breath., Disp: , Rfl:    losartan  (COZAAR ) 100 MG tablet, Take 100 mg by mouth daily. , Disp: , Rfl:    methocarbamol  (ROBAXIN ) 500 MG tablet, Take 1 tablet (500 mg total) by mouth every 6 (six) hours as needed for muscle spasms., Disp: 40 tablet, Rfl: 0   methotrexate  (RHEUMATREX) 2.5 MG tablet, Take 10 mg by mouth every Monday. Caution:Chemotherapy. Protect from light., Disp: , Rfl:    MOUNJARO  5 MG/0.5ML Pen, , Disp: , Rfl:    Multiple Vitamin (MULTIVITAMIN WITH MINERALS) TABS tablet, Take 1 tablet by mouth daily., Disp: , Rfl:    nystatin  (MYCOSTATIN /NYSTOP ) powder, Apply 1 application topically 3 (three) times daily., Disp: 30 g, Rfl: 5   oxyCODONE  (OXY IR/ROXICODONE ) 5 MG immediate release tablet, Take 1-2 tablets (5-10 mg total) by mouth every 4 (four) hours as needed for severe pain. (Patient not taking: Reported on 05/30/2022), Disp: 30 tablet, Rfl: 0   RABEprazole (ACIPHEX) 20 MG tablet, Take 20  mg by mouth daily before breakfast. , Disp: , Rfl:    RYBELSUS  7 MG TABS, Take 7 mg by mouth every morning. (Patient not taking: Reported on 08/29/2022), Disp: , Rfl:    tirzepatide  (MOUNJARO ) 10 MG/0.5ML Pen, Mounjaro  10 mg/0.5 mL subcutaneous pen injector  INJECT 10MG  SUBCUTANEOUS ONCE A WEEK 30 DAYS (Patient not taking: Reported on 08/29/2022), Disp: , Rfl:    tirzepatide  (MOUNJARO ) 10 MG/0.5ML Pen, Inject 10 mg into the skin once a week. (Patient not taking: Reported on 08/29/2022), Disp: 2 mL, Rfl: 5   tirzepatide   (MOUNJARO ) 12.5 MG/0.5ML Pen, Inject 12.5 mg into the skin once a week. (Patient not taking: Reported on 08/29/2022), Disp: 2 mL, Rfl: 6   tirzepatide  (MOUNJARO ) 5 MG/0.5ML Pen, Inject 5 mg into the skin once a week. (Patient not taking: Reported on 09/04/2023), Disp: 2 mL, Rfl: 3   tirzepatide  (MOUNJARO ) 7.5 MG/0.5ML Pen, Inject 7.5 mg into the skin once a week. (Patient not taking: Reported on 08/29/2022), Disp: 2 mL, Rfl: 5   tirzepatide  (MOUNJARO ) 7.5 MG/0.5ML Pen, Inject 7.5 mg into the skin once a week. (Patient not taking: Reported on 09/04/2023), Disp: 2 mL, Rfl: 5   traMADol  (ULTRAM ) 50 MG tablet, Take 1-2 tablets (50-100 mg total) by mouth every 6 (six) hours as needed for moderate pain., Disp: 40 tablet, Rfl: 0   triamterene -hydrochlorothiazide  (MAXZIDE -25) 37.5-25 MG tablet, Take 1 tablet by mouth daily., Disp: , Rfl:    Review of Systems     Objective:   Physical Exam  Right knee 05/17/2021:        Knee 09/18/2021:    Assessment & Plan:  Prosthetic joint infection:  He understands that if we cannot suppress his infection with chronic antibiotics he will need either effusion or an above-the-knee amputation.  Will check sed rate CRP CBC with differential and CMP  Will continue levofloxacin  and follow-up in 6 months time.  Rheumatoid thrice: Will check labs including LFTs per Dr. Kita Perish request.  He is continue on methotrexate .  Vaccine counseling recommended COVID and influenza vaccination which she declined Prosthetic joint infection:

## 2024-02-25 ENCOUNTER — Ambulatory Visit: Payer: Self-pay | Admitting: Infectious Disease

## 2024-02-27 DIAGNOSIS — C44329 Squamous cell carcinoma of skin of other parts of face: Secondary | ICD-10-CM | POA: Diagnosis not present

## 2024-03-02 NOTE — Progress Notes (Signed)
 Subjective:  Chief complaint: follow up for prosthetic joint infection  follow-up for prosthetic joint infection   patient ID: [name], male    DOB: 1961/05/21, 63 y.o.   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  He then had prostghetic joint infection with Staphylococcus lugdunensis and (methicillin sensitive in 2017 which was treated with daptomycin  along with rifampin  after I&D and polyexchange.  He apparently developed a vancomycin  allergy at that time.  He completed 6 weeks of therapy and then stopped.  He then returned in 2017 with knee pain and swelling and now was found again to have prosthetic joint infection was taken to the operating room but nothing grew from culture and he was treated for 6 weeks with daptomycin  after I&D and polyexchange he is followed by Dr. Seymour Dapper and placed on Augmentin  in February 2018 and completed at least 5 months of oral therapy.  Interim history;   He then underwent additional I&D with pollex change in November 2018 recurrent right knee infection again with negative cultures.  Then had aspirate the knee that grew Citrobacter koseri that was fairly sensitive and underwent another I&D with polyexchange in May 2021.  He was treated with protracted oral ciprofloxacin  but was having loosening of his prosthesis and was concern for infection and underwent I&D with resection arthroplasty now with an antibiotic spacer placed in September 2021.  OR cultures now yielded ampicillin  sensitive Enterococcus faecalis.  He completed 6 weeks of IV Zosyn on October 22.  After that he was placed on Augmentin  875 125 twice daily until February 2.  It was then stopped and he ultimately underwent  reimplantation of right total knee arthroplasty on December 07, 2020.  Cultures in the OR at the time of surgery were without growth.  He was doing well until few days prior to admission this June 2022 he was admitted by orthopedics due to concerns for recurrence of prosthetic joint infection.  He was having increased discomfort drainage from an anterior aspect of the knee.  He underwent I&D by Dr. Rossie Coon on June 22.  Cultures done in the  OR grew Pasteurella species.  Patient had been in the interim discharged on daptomycin  and ceftriaxone  prior to cultures resulting.  He was seen and followed by Dr.Manandhar and changed to continuous dose ampicillin  the patient completed on followed with oral amoxicillin .   In asking him closely at of his visits he told me he did  have a boxer that likes to lick his feet and also likes to jump into his lap frequently and sometimes scratches his skin.  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  I went to great lengths  several visits ago to explain that Pasteurella is a normal part of the oral flora of dogs and that the dog is not unhealthy and is in no way doing anything wrong with his behaviors though I did caution him to try to protect his skin and in particular his wounds from being scratched by his dog.  Seen in January by Dr. Rossie Coon who rated his knee and obtained cell count differential I do not have access to as well as a culture  that grew a staphylococcal species that was sensitive "against all antibiotics against present was tested.  Amoxicillin  indeed would NOT be active vs even MSSA. He was switched to ciprofloxacin  and was experiencing less pain in the knee since then.   I obtained sensitivities on the organism and it was a Staphylococcus pseudo intermedius that was sensitive to oxacillin.    I hanged the patient over to Augmentin .  He has been  following along with Dr.Alusio and not had further drainage of his knee.  Knee pain appeared to have been improving.  Unfortunately in the interim he has developed worsening swelling of the knee.  He underwent sterile aspiration of the knee which revealed "regular fluid per Dr. Ellouise Haagensen note.  Unfortunately the cultures from this aspirate have yielded an extended spectrum beta-lactamase producing Klebsiella species which curiously is reported as being sensitive to levofloxacin  but resistant to ciprofloxacin  sensitive to Bactrim and carbapenems but otherwise resistant.     He has been on levofloxacin  and tolerated this.   Int the interm he had aspirate of the knee performed and this showed 13,806 white blood cells and 97% neutrophils which is disconcerting.  Cultures did not yield an organism.        Over the last several visits his knee pain and swelling has been relatively stable he has not required drainage of fluid by Dr. Rossie Coon.  He is seeing Dr. Meredith Stalls for his rheumatoid arthritis now   Apparently she wants LFTs checked with her his meds chronically so we are adding LFTs to our labs that we routinely check with for [facility].  His knee painhad not him worsened.  He has seen Dr. Rossie Coon who drew fluid off his knee earlier this month but did not sent for fluid analysis or culture.  Aundre states that Dr.  Rossie Coon felt the fluid did not look infected.  When I last saw him in May 24   I last saw Rayden he has not had any further aspirations of his knee by Dr. Rossie Coon.  His knee pain is currently stable worsening when he has accumulation of fluid in the joint  Is following with Dr. Stefan Edge and taking methotrexate  for his rheumatoid arthritis  Discussed the use of AI scribe software for clinical note transcription with the patient, who gave verbal consent to proceed.  History of Present Illness   [name] is a 63 year old male with a chronic knee infection who presents  for follow-up. He is accompanied by his wife and his grandchild, Armenta Landau.  His right knee condition remains unchanged, with fluid accumulation noted around February or March. The fluid was aspirated without new cultures. It had been approximately nine months since the previous aspiration in July of the previous year. He is currently taking Levaquin  and experiences no significant pain.  He is under the care of Dr. Meredith Stalls for rheumatoid arthritis and is taking methotrexate  at a low dose. There is no worsening of his knee condition while on methotrexate . He underwent squamous cell carcinoma excision last week.       Past Medical History:  Diagnosis Date   Allergy    OCC    Anemia    hx of   Asthma    Cataract    small- forming    Cellulitis of lower extremity    "usually RLL; this time it's got up to my lower abdoment" (02/11/2014)-series of antibiotics completed 02-28-14   Diverticulosis    Edema    legs  ESBL (extended spectrum beta-lactamase) producing bacteria infection 04/11/2022   GERD (gastroesophageal reflux disease)    GERD (gastroesophageal reflux disease) 04/11/2022   Hypertension    Infection of right knee (HCC)    OSA (obstructive sleep apnea)    does not use cpap    Pasteurella infection 05/17/2021   Pneumonia    20+ years ago   PONV (postoperative nausea and vomiting)    Rheumatoid arthritis (HCC) dx'd ~ 1977   S/P PICC central line placement 03-16-14   right upper arm remains intact.05-11-14 removed 1 week ago.   Sleep apnea    no cpap     Past Surgical History:  Procedure Laterality Date   COLONOSCOPY  2012   EXCISIONAL HEMORRHOIDECTOMY     EXCISIONAL TOTAL KNEE ARTHROPLASTY WITH ANTIBIOTIC SPACERS Right 03/18/2014   Procedure: RIGHT KNEE RESECTION ARTHROPLASTY WITH ANTIBIOTIC SPACERS;  Surgeon: Aurther Blue, MD;  Location: [facility];  Service: Orthopedics;  Laterality: Right;   EXCISIONAL TOTAL KNEE ARTHROPLASTY WITH ANTIBIOTIC SPACERS Right 06/22/2020   Procedure:  Right knee resection arthroplasty, antibiotic spacer;  Surgeon: Liliane Rei, MD;  Location: [facility];  Service: Orthopedics;  Laterality: Right;    FOREIGN BODY REMOVAL Left    "BB" removal above left eye-teen yrs.   HERNIA REPAIR     I & D KNEE WITH POLY EXCHANGE Right 04/25/2016   Procedure: RIGHT KNEE IRRIGATION AND DEBRIDEMENT WITH POLY EXCHANGE;  Surgeon: Liliane Rei, MD;  Location: [facility];  Service: Orthopedics;  Laterality: Right;   I & D KNEE WITH POLY EXCHANGE Right 10/01/2016   Procedure: IRRIGATION AND DEBRIDEMENT KNEE WITH POLY EXCHANGE;  Surgeon: Liliane Rei, MD;  Location: [facility];  Service: Orthopedics;  Laterality: Right;   I & D KNEE WITH POLY EXCHANGE Right 08/26/2017   Procedure: IRRIGATION AND DEBRIDEMENT KNEE WITH POLY EXCHANGE;  Surgeon: Liliane Rei, MD;  Location: [facility];  Service: Orthopedics;  Laterality: Right;   I & D KNEE WITH POLY EXCHANGE Right 11/16/2019   Procedure: IRRIGATION AND DEBRIDEMENT KNEE WITH POLY EXCHANGE;  Surgeon: Liliane Rei, MD;  Location: [facility];  Service: Orthopedics;  Laterality: Right;    I & D KNEE WITH POLY EXCHANGE Right 02/22/2020   Procedure: Right knee irrigation and debridement;  Surgeon: Liliane Rei, MD;  Location: [facility];  Service: Orthopedics;  Laterality: Right;    INCISION AND DRAINAGE Right 09/28/2014   Procedure: INCISION AND DRAINAGE RIGHT KNEE;  Surgeon: Aurther Blue, MD;  Location: [facility];  Service: Orthopedics;  Laterality: Right;   IR FLUORO GUIDE CV LINE RIGHT  04/07/2021   IR REMOVAL TUN CV CATH W/O FL  05/17/2021   IR US  GUIDE VASC ACCESS RIGHT  04/07/2021   IRRIGATION AND DEBRIDEMENT KNEE Right 04/05/2021   Procedure: IRRIGATION AND DEBRIDEMENT KNEE;  Surgeon: Liliane Rei, MD;  Location: [facility];  Service: Orthopedics;  Laterality: Right;   KNEE BURSECTOMY Right 03/13/2017   Procedure: RIGHT KNEE PREPATELLAR BURSECTOMY;  Surgeon: Liliane Rei, MD;  Location: [facility];  Service: Orthopedics;   Laterality: Right;   picc line placement Right    right upper arm   REIMPLANTATION OF TOTAL KNEE Right 12/07/2020   Procedure: REIMPLANTATION OF RIGHT TOTAL KNEE;  Surgeon: Liliane Rei, MD;  Location: [facility];  Service: Orthopedics;  Laterality: Right;  2 hrs   REPLACEMENT TOTAL HIP W/  RESURFACING IMPLANTS Bilateral 01/2001; 08/2010   "left; right"   REPLACEMENT TOTAL KNEE BILATERAL Bilateral 10/1991; 10/2006   "  right; left"   REVISION TOTAL KNEE ARTHROPLASTY Right 2012   SPIGELIAN HERNIA Right 12/04/2015   Procedure: INCARCERATED SPIGELIAN HERNIA REPAIR WITH MESH;  Surgeon: Dorena Gander, MD;  Location: [facility];  Service: General;  Laterality: Right;   TOTAL KNEE ARTHROPLASTY Right 05/21/2014   Procedure: RIGHT KNEE ARTHROPLASTY REINPLANTATION;  Surgeon: Aurther Blue, MD;  Location: [facility];  Service: Orthopedics;  Laterality: Right;    Family History  Problem Relation Age of Onset   Colon cancer Mother 86       passed age 9 - stage 4    Hypertension Mother    Diabetes Mellitus II Paternal Grandmother    Colon polyps Neg Hx    Esophageal cancer Neg Hx    Stomach cancer Neg Hx    Rectal cancer Neg Hx       Social History   Socioeconomic History   Marital status: Married    Spouse name: Not on file   Number of children: 1   Years of education: 14   Highest education level: Not on file  Occupational History   Not on file  Tobacco Use   Smoking status: Former    Current packs/day: 0.00    Average packs/day: 1 pack/day for 3.0 years (3.0 ttl pk-yrs)    Types: Cigarettes    Start date: 03/16/1978    Quit date: 03/16/1981    Years since quitting: 42.9   Smokeless tobacco: Former    Types: Snuff   Tobacco comments:    "quit smoking cigarettes in the 1980's"  Vaping Use   Vaping status: Never Used  Substance and Sexual Activity   Alcohol use: No   Drug use: No   Sexual activity: Yes    Partners: Male  Other Topics Concern   Not on file  Social History Narrative   Not on  file   Social Drivers of Health   Financial Resource Strain: Not on file  Food Insecurity: Not on file  Transportation Needs: Not on file  Physical Activity: Not on file  Stress: Not on file  Social Connections: Not on file    Allergies  Allergen Reactions   Avelox [Moxifloxacin Hcl In Nacl] Hives, Shortness Of Breath and Itching    Tolerates Cipro    Chlorhexidine      Skin red and burning after skin wash   Doxycycline  Itching   Sulfa Antibiotics Itching   Vancomycin  Hives and Itching   E.E.S. [Erythromycin] Rash   Erythrocin Rash   Sulfasalazine Rash     Current Outpatient Medications:    cholecalciferol (VITAMIN D3) 25 MCG (1000 UNIT) tablet, Take 1,000 Units by mouth daily. (Patient not taking: Reported on 09/04/2023), Disp: , Rfl:    diltiazem  (CARDIZEM  CD) 180 MG 24 hr capsule, Take 1 capsule (180 mg total) by mouth daily., Disp: 30 capsule, Rfl: 2   diltiazem  (TIAZAC ) 180 MG 24 hr capsule, , Disp: , Rfl:    DILTIAZEM  HCL ER PO, Take 180 mg by mouth daily. (Patient not taking: Reported on 03/12/2023), Disp: , Rfl:    Fluticasone -Salmeterol (ADVAIR) 250-50 MCG/DOSE AEPB, Inhale 1 puff into the lungs daily., Disp: , Rfl:    folic acid  (FOLVITE ) 1 MG tablet, Take 1 mg by mouth daily., Disp: , Rfl:    Ipratropium-Albuterol  (COMBIVENT  RESPIMAT) 20-100 MCG/ACT AERS respimat, Inhale 1 puff into the lungs every 6 (six) hours as needed for wheezing or shortness of breath., Disp: , Rfl:    losartan  (COZAAR ) 100 MG tablet, Take 100  mg by mouth daily. , Disp: , Rfl:    methocarbamol  (ROBAXIN ) 500 MG tablet, Take 1 tablet (500 mg total) by mouth every 6 (six) hours as needed for muscle spasms., Disp: 40 tablet, Rfl: 0   methotrexate  (RHEUMATREX) 2.5 MG tablet, Take 10 mg by mouth every Monday. Caution:Chemotherapy. Protect from light., Disp: , Rfl:    MOUNJARO  5 MG/0.5ML Pen, , Disp: , Rfl:    Multiple Vitamin (MULTIVITAMIN WITH MINERALS) TABS tablet, Take 1 tablet by mouth daily., Disp:  , Rfl:    nystatin  (MYCOSTATIN /NYSTOP ) powder, Apply 1 application topically 3 (three) times daily., Disp: 30 g, Rfl: 5   oxyCODONE  (OXY IR/ROXICODONE ) 5 MG immediate release tablet, Take 1-2 tablets (5-10 mg total) by mouth every 4 (four) hours as needed for severe pain. (Patient not taking: Reported on 05/30/2022), Disp: 30 tablet, Rfl: 0   RABEprazole (ACIPHEX) 20 MG tablet, Take 20 mg by mouth daily before breakfast. , Disp: , Rfl:    RYBELSUS  7 MG TABS, Take 7 mg by mouth every morning. (Patient not taking: Reported on 08/29/2022), Disp: , Rfl:    tirzepatide  (MOUNJARO ) 10 MG/0.5ML Pen, Mounjaro  10 mg/0.5 mL subcutaneous pen injector  INJECT 10MG  SUBCUTANEOUS ONCE A WEEK 30 DAYS (Patient not taking: Reported on 08/29/2022), Disp: , Rfl:    tirzepatide  (MOUNJARO ) 10 MG/0.5ML Pen, Inject 10 mg into the skin once a week. (Patient not taking: Reported on 08/29/2022), Disp: 2 mL, Rfl: 5   tirzepatide  (MOUNJARO ) 12.5 MG/0.5ML Pen, Inject 12.5 mg into the skin once a week. (Patient not taking: Reported on 08/29/2022), Disp: 2 mL, Rfl: 6   tirzepatide  (MOUNJARO ) 5 MG/0.5ML Pen, Inject 5 mg into the skin once a week. (Patient not taking: Reported on 09/04/2023), Disp: 2 mL, Rfl: 3   tirzepatide  (MOUNJARO ) 7.5 MG/0.5ML Pen, Inject 7.5 mg into the skin once a week. (Patient not taking: Reported on 08/29/2022), Disp: 2 mL, Rfl: 5   tirzepatide  (MOUNJARO ) 7.5 MG/0.5ML Pen, Inject 7.5 mg into the skin once a week. (Patient not taking: Reported on 09/04/2023), Disp: 2 mL, Rfl: 5   traMADol  (ULTRAM ) 50 MG tablet, Take 1-2 tablets (50-100 mg total) by mouth every 6 (six) hours as needed for moderate pain., Disp: 40 tablet, Rfl: 0   triamterene -hydrochlorothiazide  (MAXZIDE -25) 37.5-25 MG tablet, Take 1 tablet by mouth daily., Disp: , Rfl:    Review of Systems  Constitutional:  Negative for activity change, appetite change, chills, diaphoresis, fatigue, fever and unexpected weight change.  HENT:  Negative for  congestion, rhinorrhea, sinus pressure, sneezing, sore throat and trouble swallowing.   Eyes:  Negative for photophobia and visual disturbance.  Respiratory:  Negative for cough, chest tightness, shortness of breath, wheezing and stridor.   Cardiovascular:  Negative for chest pain, palpitations and leg swelling.  Gastrointestinal:  Negative for abdominal distention, abdominal pain, anal bleeding, blood in stool, constipation, diarrhea, nausea and vomiting.  Genitourinary:  Negative for difficulty urinating, dysuria, flank pain and hematuria.  Musculoskeletal:  Positive for joint swelling. Negative for arthralgias, back pain, gait problem and myalgias.  Skin:  Negative for color change, pallor, rash and wound.  Neurological:  Negative for dizziness, tremors, weakness and light-headedness.  Hematological:  Negative for adenopathy. Does not bruise/bleed easily.  Psychiatric/Behavioral:  Negative for agitation, behavioral problems, confusion, decreased concentration, dysphoric mood and sleep disturbance.        Objective:   Physical Exam Constitutional:      Appearance: He is well-developed.  HENT:  Head: Normocephalic and atraumatic.  Eyes:     Conjunctiva/sclera: Conjunctivae normal.  Cardiovascular:     Rate and Rhythm: Normal rate and regular rhythm.  Pulmonary:     Effort: Pulmonary effort is normal. No respiratory distress.     Breath sounds: No wheezing.  Abdominal:     General: There is no distension.     Palpations: Abdomen is soft.  Musculoskeletal:        General: No tenderness. Normal range of motion.     Cervical back: Normal range of motion and neck supple.  Skin:    General: Skin is warm and dry.     Coloration: Skin is not pale.     Findings: No erythema or rash.  Neurological:     General: No focal deficit present.     Mental Status: He is alert and oriented to person, place, and time.  Psychiatric:        Mood and Affect: Mood normal.        Behavior:  Behavior normal.        Thought Content: Thought content normal.        Judgment: Judgment normal.     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 Discussed potential future interventions if infection control is lost, including knee fusion or amputation, with pros and cons of each option. - Continue Levaquin  as long as infection is controlled. - Order comprehensive metabolic panel to assess kidney and liver function. - Order CBC, sed rate, and CRP to monitor inflammatory markers. - Forward lab results and note to Dr. Meredith Stalls.  Rheumatoid arthritis Rheumatoid arthritis managed by Dr. Marylin So with methotrexate . No worsening of knee condition on methotrexate . as above will send her our labs  Squamous cell carcinoma of skin Recent excision of squamous cell carcinoma from the forehead. Scheduled for a full body scan with a dermatologist. - Ensure regular dermatology follow-ups for skin checks.

## 2024-03-03 ENCOUNTER — Other Ambulatory Visit: Payer: Self-pay

## 2024-03-03 ENCOUNTER — Ambulatory Visit: Payer: Self-pay | Admitting: Infectious Disease

## 2024-03-03 ENCOUNTER — Encounter: Payer: Self-pay | Admitting: Infectious Disease

## 2024-03-03 VITALS — BP 136/86 | HR 78 | Resp 16 | Ht 68.0 in | Wt 283.0 lb

## 2024-03-03 DIAGNOSIS — A499 Bacterial infection, unspecified: Secondary | ICD-10-CM

## 2024-03-03 DIAGNOSIS — M069 Rheumatoid arthritis, unspecified: Secondary | ICD-10-CM | POA: Diagnosis not present

## 2024-03-03 DIAGNOSIS — T8453XD Infection and inflammatory reaction due to internal right knee prosthesis, subsequent encounter: Secondary | ICD-10-CM | POA: Diagnosis not present

## 2024-03-03 DIAGNOSIS — Z1612 Extended spectrum beta lactamase (ESBL) resistance: Secondary | ICD-10-CM

## 2024-03-03 DIAGNOSIS — B9689 Other specified bacterial agents as the cause of diseases classified elsewhere: Secondary | ICD-10-CM | POA: Diagnosis not present

## 2024-03-03 DIAGNOSIS — T8450XD Infection and inflammatory reaction due to unspecified internal joint prosthesis, subsequent encounter: Secondary | ICD-10-CM

## 2024-03-03 MED ORDER — LEVOFLOXACIN 750 MG PO TABS: 750.0000 mg | ORAL_TABLET | Freq: Every day | ORAL | 11 refills | Status: DC

## 2024-03-04 LAB — COMPREHENSIVE METABOLIC PANEL WITH GFR
AG Ratio: 1.2 (calc) (ref 1.0–2.5)
ALT: 9 U/L (ref 9–46)
AST: 17 U/L (ref 10–35)
Albumin: 3.5 g/dL — ABNORMAL LOW (ref 3.6–5.1)
Alkaline phosphatase (APISO): 62 U/L (ref 35–144)
BUN: 19 mg/dL (ref 7–25)
CO2: 27 mmol/L (ref 20–32)
Calcium: 9.2 mg/dL (ref 8.6–10.3)
Chloride: 104 mmol/L (ref 98–110)
Creat: 1.16 mg/dL (ref 0.70–1.35)
Globulin: 3 g/dL (ref 1.9–3.7)
Glucose, Bld: 78 mg/dL (ref 65–99)
Potassium: 4.3 mmol/L (ref 3.5–5.3)
Sodium: 139 mmol/L (ref 135–146)
Total Bilirubin: 0.6 mg/dL (ref 0.2–1.2)
Total Protein: 6.5 g/dL (ref 6.1–8.1)
eGFR: 71 mL/min/{1.73_m2} (ref >=60)

## 2024-03-04 LAB — C-REACTIVE PROTEIN: CRP: 26.1 mg/L — ABNORMAL HIGH (ref <8.0)

## 2024-03-04 LAB — SEDIMENTATION RATE: Sed Rate: 17 mm/h (ref 0–20)

## 2024-03-10 DIAGNOSIS — Z79899 Other long term (current) drug therapy: Secondary | ICD-10-CM | POA: Diagnosis not present

## 2024-03-10 DIAGNOSIS — M0579 Rheumatoid arthritis with rheumatoid factor of multiple sites without organ or systems involvement: Secondary | ICD-10-CM | POA: Diagnosis not present

## 2024-03-24 DIAGNOSIS — M25461 Effusion, right knee: Secondary | ICD-10-CM | POA: Diagnosis not present

## 2024-03-24 DIAGNOSIS — Z8619 Personal history of other infectious and parasitic diseases: Secondary | ICD-10-CM | POA: Diagnosis not present

## 2024-04-01 ENCOUNTER — Ambulatory Visit: Payer: Self-pay | Admitting: Infectious Disease

## 2024-04-02 ENCOUNTER — Ambulatory Visit: Payer: Self-pay | Admitting: Infectious Disease

## 2024-04-14 DIAGNOSIS — R7989 Other specified abnormal findings of blood chemistry: Secondary | ICD-10-CM | POA: Diagnosis not present

## 2024-05-14 DIAGNOSIS — Z872 Personal history of diseases of the skin and subcutaneous tissue: Secondary | ICD-10-CM | POA: Diagnosis not present

## 2024-05-14 DIAGNOSIS — L570 Actinic keratosis: Secondary | ICD-10-CM | POA: Diagnosis not present

## 2024-05-14 DIAGNOSIS — Z859 Personal history of malignant neoplasm, unspecified: Secondary | ICD-10-CM | POA: Diagnosis not present

## 2024-05-14 DIAGNOSIS — L578 Other skin changes due to chronic exposure to nonionizing radiation: Secondary | ICD-10-CM | POA: Diagnosis not present

## 2024-06-04 DIAGNOSIS — M1991 Primary osteoarthritis, unspecified site: Secondary | ICD-10-CM | POA: Diagnosis not present

## 2024-06-04 DIAGNOSIS — Z6841 Body Mass Index (BMI) 40.0 and over, adult: Secondary | ICD-10-CM | POA: Diagnosis not present

## 2024-06-04 DIAGNOSIS — Z79899 Other long term (current) drug therapy: Secondary | ICD-10-CM | POA: Diagnosis not present

## 2024-06-04 DIAGNOSIS — M0579 Rheumatoid arthritis with rheumatoid factor of multiple sites without organ or systems involvement: Secondary | ICD-10-CM | POA: Diagnosis not present

## 2024-06-30 DIAGNOSIS — M25461 Effusion, right knee: Secondary | ICD-10-CM | POA: Diagnosis not present

## 2024-08-17 DIAGNOSIS — M25461 Effusion, right knee: Secondary | ICD-10-CM | POA: Diagnosis not present

## 2024-08-31 ENCOUNTER — Ambulatory Visit: Admitting: Infectious Disease

## 2024-09-03 DIAGNOSIS — M0579 Rheumatoid arthritis with rheumatoid factor of multiple sites without organ or systems involvement: Secondary | ICD-10-CM | POA: Diagnosis not present

## 2024-09-21 NOTE — Progress Notes (Deleted)
 Subjective:  Chief complaint: follow up for prosthetic joint infection  follow-up for prosthetic joint infection   patient ID: [name], male    DOB: 1961/03/25, 63 y.o.   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  He then had prostghetic joint infection with Staphylococcus lugdunensis and (methicillin sensitive in 2017 which was treated with daptomycin  along with rifampin  after I&D and polyexchange.  He apparently developed a vancomycin  allergy at that time.  He completed 6 weeks of therapy and then stopped.  He then returned in 2017 with knee pain and swelling and now was found again to have prosthetic joint infection was taken to the operating room but nothing grew from culture and he was treated for 6 weeks with daptomycin  after I&D and polyexchange he is followed by Dr. Efrain and placed on Augmentin  in February 2018 and completed at least 5 months of oral therapy.  Interim history;   He then underwent additional I&D with pollex change in November 2018 recurrent right knee infection again with negative cultures.  Then had aspirate the knee that grew Citrobacter koseri that was fairly sensitive and underwent another I&D with polyexchange in May 2021.  He was treated with protracted oral ciprofloxacin  but was having loosening of his prosthesis and was concern for infection and underwent I&D with resection arthroplasty now with an antibiotic spacer placed in September 2021.  OR cultures now yielded ampicillin  sensitive Enterococcus faecalis.  He completed 6 weeks of IV Zosyn on October 22.  After that he was placed on Augmentin  875 125 twice daily until February 2.  It was then stopped and he ultimately underwent  reimplantation of right total knee arthroplasty on December 07, 2020.  Cultures in the OR at the time of surgery were without growth.  He was doing well until few days prior to admission this June 2022 he was admitted by orthopedics due to concerns for recurrence of prosthetic joint infection.  He was having increased discomfort drainage from an anterior aspect of the knee.  He underwent I&D by Dr. Hiram on June 22.  Cultures done in the  OR grew Pasteurella species.  Patient had been in the interim discharged on daptomycin  and ceftriaxone  prior to cultures resulting.  He was seen and followed by Dr.Manandhar and changed to continuous dose ampicillin  the patient completed on followed with oral amoxicillin .   In asking him closely at of his visits he told me he did  have a boxer that likes to lick his feet and also likes to jump into his lap frequently and sometimes scratches his skin.  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  I went to great lengths  several visits ago to explain that Pasteurella is a normal part of the oral flora of dogs and that the dog is not unhealthy and is in no way doing anything wrong with his behaviors though I did caution him to try to protect his skin and in particular his wounds from being scratched by his dog.  Seen in January by Dr. Hiram who rated his knee and obtained cell count differential I do not have access to as well as a culture  that grew a staphylococcal species that was sensitive against all antibiotics against present was tested.  Amoxicillin  indeed would NOT be active vs even MSSA. He was switched to ciprofloxacin  and was experiencing less pain in the knee since then.   I obtained sensitivities on the organism and it was a Staphylococcus pseudo intermedius that was sensitive to oxacillin.    I hanged the patient over to Augmentin .  He has been  following along with Dr.Alusio and not had further drainage of his knee.  Knee pain appeared to have been improving.  Unfortunately in the interim he has developed worsening swelling of the knee.  He underwent sterile aspiration of the knee which revealed regular fluid per Dr. Daralyn note.  Unfortunately the cultures from this aspirate have yielded an extended spectrum beta-lactamase producing Klebsiella species which curiously is reported as being sensitive to levofloxacin  but resistant to ciprofloxacin  sensitive to Bactrim and carbapenems but otherwise resistant.     He has been on levofloxacin  and tolerated this.   Int the interm he had aspirate of the knee performed and this showed 13,806 white blood cells and 97% neutrophils which is disconcerting.  Cultures did not yield an organism.        Over the last several visits his knee pain and swelling has been relatively stable he has not required drainage of fluid by Dr. Hiram.  He is seeing Dr. Ishmael for his rheumatoid arthritis now   Apparently she wants LFTs checked with her his meds chronically so we are adding LFTs to our labs that we routinely check with for [facility].  His knee painhad not him worsened.  He has seen Dr. Hiram who drew fluid off his knee earlier this month but did not sent for fluid analysis or culture.  [name] states that Dr.  Hiram felt the fluid did not look infected.  When I last saw him in May 24   I last saw [name] he has not had any further aspirations of his knee by Dr. Hiram.  His knee pain is currently stable worsening when he has accumulation of fluid in the joint  Is following with Dr. Jon Ishmael and taking methotrexate  for his rheumatoid arthritis  Discussed the use of AI scribe software for clinical note transcription with the patient, who gave verbal consent to proceed.  History of Present Illness           Past Medical History:  Diagnosis Date   Allergy    OCC    Anemia     hx of   Asthma    Cataract    small- forming    Cellulitis of lower extremity    usually RLL; this time it's got up to my lower abdoment (02/11/2014)-series of antibiotics completed 02-28-14   Diverticulosis    Edema    legs   ESBL (extended spectrum beta-lactamase) producing bacteria infection 04/11/2022   GERD (gastroesophageal reflux disease)    GERD (gastroesophageal reflux disease) 04/11/2022   Hypertension    Infection of right knee (HCC)    OSA (obstructive sleep apnea)    does not use cpap    Pasteurella infection 05/17/2021   Pneumonia    20+ years ago   PONV (postoperative nausea and vomiting)    Rheumatoid arthritis (HCC) dx'd ~ 1977   S/P PICC central line placement 03-16-14   right upper arm remains intact.05-11-14 removed 1 week ago.   Sleep apnea    no cpap     Past Surgical  History:  Procedure Laterality Date   COLONOSCOPY  2012   EXCISIONAL HEMORRHOIDECTOMY     EXCISIONAL TOTAL KNEE ARTHROPLASTY WITH ANTIBIOTIC SPACERS Right 03/18/2014   Procedure: RIGHT KNEE RESECTION ARTHROPLASTY WITH ANTIBIOTIC SPACERS;  Surgeon: Dempsey Melodi GAILS, MD;  Location: [facility];  Service: Orthopedics;  Laterality: Right;   EXCISIONAL TOTAL KNEE ARTHROPLASTY WITH ANTIBIOTIC SPACERS Right 06/22/2020   Procedure: Right knee resection arthroplasty, antibiotic spacer;  Surgeon: Melodi Dempsey, MD;  Location: [facility];  Service: Orthopedics;  Laterality: Right;    FOREIGN BODY REMOVAL Left    BB removal above left eye-teen yrs.   HERNIA REPAIR     I & D KNEE WITH POLY EXCHANGE Right 04/25/2016   Procedure: RIGHT KNEE IRRIGATION AND DEBRIDEMENT WITH POLY EXCHANGE;  Surgeon: Dempsey Melodi, MD;  Location: [facility];  Service: Orthopedics;  Laterality: Right;   I & D KNEE WITH POLY EXCHANGE Right 10/01/2016   Procedure: IRRIGATION AND DEBRIDEMENT KNEE WITH POLY EXCHANGE;  Surgeon: Dempsey Melodi, MD;  Location: [facility];  Service: Orthopedics;  Laterality: Right;   I & D KNEE WITH POLY EXCHANGE  Right 08/26/2017   Procedure: IRRIGATION AND DEBRIDEMENT KNEE WITH POLY EXCHANGE;  Surgeon: Melodi Dempsey, MD;  Location: [facility];  Service: Orthopedics;  Laterality: Right;   I & D KNEE WITH POLY EXCHANGE Right 11/16/2019   Procedure: IRRIGATION AND DEBRIDEMENT KNEE WITH POLY EXCHANGE;  Surgeon: Melodi Dempsey, MD;  Location: [facility];  Service: Orthopedics;  Laterality: Right;    I & D KNEE WITH POLY EXCHANGE Right 02/22/2020   Procedure: Right knee irrigation and debridement;  Surgeon: Melodi Dempsey, MD;  Location: [facility];  Service: Orthopedics;  Laterality: Right;    INCISION AND DRAINAGE Right 09/28/2014   Procedure: INCISION AND DRAINAGE RIGHT KNEE;  Surgeon: Dempsey Melodi GAILS, MD;  Location: [facility];  Service: Orthopedics;  Laterality: Right;   IR FLUORO GUIDE CV LINE RIGHT  04/07/2021   IR REMOVAL TUN CV CATH W/O FL  05/17/2021   IR US  GUIDE VASC ACCESS RIGHT  04/07/2021   IRRIGATION AND DEBRIDEMENT KNEE Right 04/05/2021   Procedure: IRRIGATION AND DEBRIDEMENT KNEE;  Surgeon: Melodi Dempsey, MD;  Location: [facility];  Service: Orthopedics;  Laterality: Right;   KNEE BURSECTOMY Right 03/13/2017   Procedure: RIGHT KNEE PREPATELLAR BURSECTOMY;  Surgeon: Melodi Dempsey, MD;  Location: [facility];  Service: Orthopedics;  Laterality: Right;   picc line placement Right    right upper arm   REIMPLANTATION OF TOTAL KNEE Right 12/07/2020   Procedure: REIMPLANTATION OF RIGHT TOTAL KNEE;  Surgeon: Melodi Dempsey, MD;  Location: [facility];  Service: Orthopedics;  Laterality: Right;  2 hrs   REPLACEMENT TOTAL HIP W/  RESURFACING IMPLANTS Bilateral 01/2001; 08/2010   left; right   REPLACEMENT TOTAL KNEE BILATERAL Bilateral 10/1991; 10/2006   right; left   REVISION TOTAL KNEE ARTHROPLASTY Right 2012   SPIGELIAN HERNIA Right 12/04/2015   Procedure: INCARCERATED SPIGELIAN HERNIA REPAIR WITH MESH;  Surgeon: Dann Hummer, MD;  Location: [facility];  Service: General;  Laterality: Right;   TOTAL KNEE ARTHROPLASTY  Right 05/21/2014   Procedure: RIGHT KNEE ARTHROPLASTY REINPLANTATION;  Surgeon: Dempsey Melodi GAILS, MD;  Location: [facility];  Service: Orthopedics;  Laterality: Right;    Family History  Problem Relation Age of Onset   Colon cancer Mother 14       passed age 58 - stage 4    Hypertension Mother    Diabetes Mellitus II Paternal  Grandmother    Colon polyps Neg Hx    Esophageal cancer Neg Hx    Stomach cancer Neg Hx    Rectal cancer Neg Hx       Social History   Socioeconomic History   Marital status: Married    Spouse name: Not on file   Number of children: 1   Years of education: 14   Highest education level: Not on file  Occupational History   Not on file  Tobacco Use   Smoking status: Former    Current packs/day: 0.00    Average packs/day: 1 pack/day for 3.0 years (3.0 ttl pk-yrs)    Types: Cigarettes    Start date: 03/16/1978    Quit date: 03/16/1981    Years since quitting: 43.5   Smokeless tobacco: Former    Types: Snuff   Tobacco comments:    quit smoking cigarettes in the 1980's  Vaping Use   Vaping status: Never Used  Substance and Sexual Activity   Alcohol use: No   Drug use: No   Sexual activity: Yes    Partners: Male  Other Topics Concern   Not on file  Social History Narrative   Not on file   Social Drivers of Health   Financial Resource Strain: Not on file  Food Insecurity: Not on file  Transportation Needs: Not on file  Physical Activity: Not on file  Stress: Not on file  Social Connections: Not on file    Allergies  Allergen Reactions   Avelox [Moxifloxacin Hcl In Nacl] Hives, Shortness Of Breath and Itching    Tolerates Cipro    Chlorhexidine      Skin red and burning after skin wash   Doxycycline  Itching   Sulfa Antibiotics Itching   Vancomycin  Hives and Itching   E.E.S. [Erythromycin] Rash   Erythrocin Rash   Sulfasalazine Rash     Current Outpatient Medications:    cholecalciferol (VITAMIN D3) 25 MCG (1000 UNIT) tablet, Take 1,000  Units by mouth daily. (Patient not taking: Reported on 09/04/2023), Disp: , Rfl:    diltiazem  (CARDIZEM  CD) 180 MG 24 hr capsule, Take 1 capsule (180 mg total) by mouth daily., Disp: 30 capsule, Rfl: 2   diltiazem  (TIAZAC ) 180 MG 24 hr capsule, , Disp: , Rfl:    DILTIAZEM  HCL ER PO, Take 180 mg by mouth daily. (Patient not taking: Reported on 03/12/2023), Disp: , Rfl:    Fluticasone -Salmeterol (ADVAIR) 250-50 MCG/DOSE AEPB, Inhale 1 puff into the lungs daily., Disp: , Rfl:    folic acid  (FOLVITE ) 1 MG tablet, Take 1 mg by mouth daily., Disp: , Rfl:    Ipratropium-Albuterol  (COMBIVENT  RESPIMAT) 20-100 MCG/ACT AERS respimat, Inhale 1 puff into the lungs every 6 (six) hours as needed for wheezing or shortness of breath., Disp: , Rfl:    levofloxacin  (LEVAQUIN ) 750 MG tablet, Take 1 tablet (750 mg total) by mouth daily., Disp: 30 tablet, Rfl: 11   losartan  (COZAAR ) 100 MG tablet, Take 100 mg by mouth daily. , Disp: , Rfl:    methocarbamol  (ROBAXIN ) 500 MG tablet, Take 1 tablet (500 mg total) by mouth every 6 (six) hours as needed for muscle spasms., Disp: 40 tablet, Rfl: 0   methotrexate  (RHEUMATREX) 2.5 MG tablet, Take 10 mg by mouth every Monday. Caution:Chemotherapy. Protect from light., Disp: , Rfl:    Multiple Vitamin (MULTIVITAMIN WITH MINERALS) TABS tablet, Take 1 tablet by mouth daily., Disp: , Rfl:    nystatin  (MYCOSTATIN /NYSTOP ) powder, Apply 1 application topically 3 (three)  times daily., Disp: 30 g, Rfl: 5   oxyCODONE  (OXY IR/ROXICODONE ) 5 MG immediate release tablet, Take 1-2 tablets (5-10 mg total) by mouth every 4 (four) hours as needed for severe pain. (Patient not taking: Reported on 03/03/2024), Disp: 30 tablet, Rfl: 0   RABEprazole (ACIPHEX) 20 MG tablet, Take 20 mg by mouth daily before breakfast. , Disp: , Rfl:    RYBELSUS  7 MG TABS, Take 7 mg by mouth every morning. (Patient not taking: Reported on 08/29/2022), Disp: , Rfl:    tirzepatide  (MOUNJARO ) 10 MG/0.5ML Pen, Mounjaro  10 mg/0.5  mL subcutaneous pen injector  INJECT 10MG  SUBCUTANEOUS ONCE A WEEK 30 DAYS (Patient not taking: Reported on 08/29/2022), Disp: , Rfl:    tirzepatide  (MOUNJARO ) 10 MG/0.5ML Pen, Inject 10 mg into the skin once a week. (Patient not taking: Reported on 03/03/2024), Disp: 2 mL, Rfl: 5   tirzepatide  (MOUNJARO ) 12.5 MG/0.5ML Pen, Inject 12.5 mg into the skin once a week. (Patient not taking: Reported on 03/03/2024), Disp: 2 mL, Rfl: 6   tirzepatide  (MOUNJARO ) 12.5 MG/0.5ML Pen, Inject 12.5 mg into the skin once a week., Disp: , Rfl:    tirzepatide  (MOUNJARO ) 5 MG/0.5ML Pen, Inject 5 mg into the skin once a week. (Patient not taking: Reported on 03/03/2024), Disp: 2 mL, Rfl: 3   tirzepatide  (MOUNJARO ) 7.5 MG/0.5ML Pen, Inject 7.5 mg into the skin once a week. (Patient not taking: Reported on 03/03/2024), Disp: 2 mL, Rfl: 5   tirzepatide  (MOUNJARO ) 7.5 MG/0.5ML Pen, Inject 7.5 mg into the skin once a week. (Patient not taking: Reported on 03/03/2024), Disp: 2 mL, Rfl: 5   traMADol  (ULTRAM ) 50 MG tablet, Take 1-2 tablets (50-100 mg total) by mouth every 6 (six) hours as needed for moderate pain., Disp: 40 tablet, Rfl: 0   triamterene -hydrochlorothiazide  (MAXZIDE -25) 37.5-25 MG tablet, Take 1 tablet by mouth daily., Disp: , Rfl:    Review of Systems  Constitutional:  Negative for activity change, appetite change, chills, diaphoresis, fatigue, fever and unexpected weight change.  HENT:  Negative for congestion, rhinorrhea, sinus pressure, sneezing, sore throat and trouble swallowing.   Eyes:  Negative for photophobia and visual disturbance.  Respiratory:  Negative for cough, chest tightness, shortness of breath, wheezing and stridor.   Cardiovascular:  Negative for chest pain, palpitations and leg swelling.  Gastrointestinal:  Negative for abdominal distention, abdominal pain, anal bleeding, blood in stool, constipation, diarrhea, nausea and vomiting.  Genitourinary:  Negative for difficulty urinating, dysuria,  flank pain and hematuria.  Musculoskeletal:  Positive for joint swelling. Negative for arthralgias, back pain, gait problem and myalgias.  Skin:  Negative for color change, pallor, rash and wound.  Neurological:  Negative for dizziness, tremors, weakness and light-headedness.  Hematological:  Negative for adenopathy. Does not bruise/bleed easily.  Psychiatric/Behavioral:  Negative for agitation, behavioral problems, confusion, decreased concentration, dysphoric mood and sleep disturbance.        Objective:   Physical Exam Constitutional:      Appearance: He is well-developed.  HENT:     Head: Normocephalic and atraumatic.  Eyes:     Conjunctiva/sclera: Conjunctivae normal.  Cardiovascular:     Rate and Rhythm: Normal rate and regular rhythm.  Pulmonary:     Effort: Pulmonary effort is normal. No respiratory distress.     Breath sounds: No wheezing.  Abdominal:     General: There is no distension.     Palpations: Abdomen is soft.  Musculoskeletal:        General: No tenderness. Normal  range of motion.     Cervical back: Normal range of motion and neck supple.  Skin:    General: Skin is warm and dry.     Coloration: Skin is not pale.     Findings: No erythema or rash.  Neurological:     General: No focal deficit present.     Mental Status: He is alert and oriented to person, place, and time.  Psychiatric:        Mood and Affect: Mood normal.        Behavior: Behavior normal.        Thought Content: Thought content normal.        Judgment: Judgment normal.     Right knee 05/17/2021:        Knee 09/18/2021:    Assessment & Plan:   Assessment and Plan

## 2024-09-22 ENCOUNTER — Ambulatory Visit: Admitting: Infectious Disease

## 2024-10-11 NOTE — Progress Notes (Deleted)
 "  Subjective:  Chief complaint: follow up for prosthetic joint infection  follow-up for prosthetic joint infection   patient ID: [name], male    DOB: 11-07-1960, 63 y.o.   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  He then had prostghetic joint infection with Staphylococcus lugdunensis and (methicillin sensitive in 2017 which was treated with daptomycin  along with rifampin  after I&D and polyexchange.  He apparently developed a vancomycin  allergy at that time.  He completed 6 weeks of therapy and then stopped.  He then returned in 2017 with knee pain and swelling and now was found again to have prosthetic joint infection was taken to the operating room but nothing grew from culture and he was treated for 6 weeks with daptomycin  after I&D and polyexchange he is followed by Dr. Efrain and placed on Augmentin  in February 2018 and completed at least 5 months of oral therapy.  Interim history;   He then underwent additional I&D with pollex change in November 2018 recurrent right knee infection again with negative cultures.  Then had aspirate the knee that grew Citrobacter koseri that was fairly sensitive and underwent another I&D with polyexchange in May 2021.  He was treated with protracted oral ciprofloxacin  but was having loosening of his prosthesis and was concern for infection and underwent I&D with resection arthroplasty now with an antibiotic spacer placed in September 2021.  OR cultures now yielded ampicillin  sensitive Enterococcus faecalis.  He completed 6 weeks of IV Zosyn on October 22.  After that he was placed on Augmentin  875 125 twice daily until February 2.  It was then stopped and he ultimately underwent  reimplantation of right total knee arthroplasty on December 07, 2020.  Cultures in the OR at the time of surgery were without growth.  He was doing well until few days prior to admission this June 2022 he was admitted by orthopedics due to concerns for recurrence of prosthetic joint infection.  He was having increased discomfort drainage from an anterior aspect of the knee.  He underwent I&D by Dr. Hiram on June 22.  Cultures done in the  OR grew Pasteurella species.  Patient had been in the interim discharged on daptomycin  and ceftriaxone  prior to cultures resulting.  He was seen and followed by Dr.Manandhar and changed to continuous dose ampicillin  the patient completed on followed with oral amoxicillin .   In asking him closely at of his visits he told me he did  have a boxer that likes to lick his feet and also likes to jump into his lap frequently and sometimes scratches his skin.  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  I went to great lengths  several visits ago to explain that Pasteurella is a normal part of the oral flora of dogs and that the dog is not unhealthy and is in no way doing anything wrong with his behaviors though I did caution him to try to protect his skin and in particular his wounds from being scratched by his dog.  Seen in January by Dr. Hiram who rated his knee and obtained cell count differential I do not have access to as well as a  culture that grew a staphylococcal species that was sensitive against all antibiotics against present was tested.  Amoxicillin  indeed would NOT be active vs even MSSA. He was switched to ciprofloxacin  and was experiencing less pain in the knee since then.   I obtained sensitivities on the organism and it was a Staphylococcus pseudo intermedius that was sensitive to oxacillin.    I hanged the patient over to Augmentin .  He has been  following along with Dr.Alusio and not had further drainage of his knee.  Knee pain appeared to have been improving.  Unfortunately in the interim he has developed worsening swelling of the knee.  He underwent sterile aspiration of the knee which revealed regular fluid per Dr. Daralyn note.  Unfortunately the cultures from this aspirate have yielded an extended spectrum beta-lactamase producing Klebsiella species which curiously is reported as being sensitive to levofloxacin  but resistant to ciprofloxacin  sensitive to Bactrim and carbapenems but otherwise resistant.     He has been on levofloxacin  and tolerated this.   Int the interm he had aspirate of the knee performed and this showed 13,806 white blood cells and 97% neutrophils which is disconcerting.  Cultures did not yield an organism.        Over the last several visits his knee pain and swelling has been relatively stable he has not required drainage of fluid by Dr. Hiram.  He is seeing Dr. Ishmael for his rheumatoid arthritis now   Apparently she wants LFTs checked with her his meds chronically so we are adding LFTs to our labs that we routinely check with for [facility].  His knee painhad not him worsened.  He has seen Dr. Hiram who drew fluid off his knee earlier this month but did not sent for fluid analysis or culture.  [name] states that Dr.  Hiram felt the fluid did not look infected.  When I last saw him in May 24   I last saw [name] he has not had any further aspirations of his knee by Dr. Hiram.  His knee pain is currently stable worsening when he has accumulation of fluid in the joint  Is following with Dr. Jon Ishmael and taking methotrexate  for his rheumatoid arthritis  Discussed the use of AI scribe software for clinical note transcription with the patient, who gave verbal consent to proceed.  History of Present Illness           Past Medical History:  Diagnosis Date   Allergy    OCC     Anemia    hx of   Asthma    Cataract    small- forming    Cellulitis of lower extremity    usually RLL; this time it's got up to my lower abdoment (02/11/2014)-series of antibiotics completed 02-28-14   Diverticulosis    Edema    legs   ESBL (extended spectrum beta-lactamase) producing bacteria infection 04/11/2022   GERD (gastroesophageal reflux disease)    GERD (gastroesophageal reflux disease) 04/11/2022   Hypertension    Infection of right knee (HCC)    OSA (obstructive sleep apnea)    does not use cpap    Pasteurella infection 05/17/2021   Pneumonia    20+ years ago   PONV (postoperative nausea and vomiting)    Rheumatoid arthritis (HCC) dx'd ~ 1977   S/P PICC central line placement 03-16-14   right upper arm remains intact.05-11-14 removed 1 week ago.   Sleep apnea    no cpap     Past  Surgical History:  Procedure Laterality Date   COLONOSCOPY  2012   EXCISIONAL HEMORRHOIDECTOMY     EXCISIONAL TOTAL KNEE ARTHROPLASTY WITH ANTIBIOTIC SPACERS Right 03/18/2014   Procedure: RIGHT KNEE RESECTION ARTHROPLASTY WITH ANTIBIOTIC SPACERS;  Surgeon: Dempsey Melodi GAILS, MD;  Location: [facility];  Service: Orthopedics;  Laterality: Right;   EXCISIONAL TOTAL KNEE ARTHROPLASTY WITH ANTIBIOTIC SPACERS Right 06/22/2020   Procedure: Right knee resection arthroplasty, antibiotic spacer;  Surgeon: Melodi Dempsey, MD;  Location: [facility];  Service: Orthopedics;  Laterality: Right;    FOREIGN BODY REMOVAL Left    BB removal above left eye-teen yrs.   HERNIA REPAIR     I & D KNEE WITH POLY EXCHANGE Right 04/25/2016   Procedure: RIGHT KNEE IRRIGATION AND DEBRIDEMENT WITH POLY EXCHANGE;  Surgeon: Dempsey Melodi, MD;  Location: [facility];  Service: Orthopedics;  Laterality: Right;   I & D KNEE WITH POLY EXCHANGE Right 10/01/2016   Procedure: IRRIGATION AND DEBRIDEMENT KNEE WITH POLY EXCHANGE;  Surgeon: Dempsey Melodi, MD;  Location: [facility];  Service: Orthopedics;  Laterality: Right;    I & D KNEE WITH POLY EXCHANGE Right 08/26/2017   Procedure: IRRIGATION AND DEBRIDEMENT KNEE WITH POLY EXCHANGE;  Surgeon: Melodi Dempsey, MD;  Location: [facility];  Service: Orthopedics;  Laterality: Right;   I & D KNEE WITH POLY EXCHANGE Right 11/16/2019   Procedure: IRRIGATION AND DEBRIDEMENT KNEE WITH POLY EXCHANGE;  Surgeon: Melodi Dempsey, MD;  Location: [facility];  Service: Orthopedics;  Laterality: Right;    I & D KNEE WITH POLY EXCHANGE Right 02/22/2020   Procedure: Right knee irrigation and debridement;  Surgeon: Melodi Dempsey, MD;  Location: [facility];  Service: Orthopedics;  Laterality: Right;    INCISION AND DRAINAGE Right 09/28/2014   Procedure: INCISION AND DRAINAGE RIGHT KNEE;  Surgeon: Dempsey Melodi GAILS, MD;  Location: [facility];  Service: Orthopedics;  Laterality: Right;   IR FLUORO GUIDE CV LINE RIGHT  04/07/2021   IR REMOVAL TUN CV CATH W/O FL  05/17/2021   IR US  GUIDE VASC ACCESS RIGHT  04/07/2021   IRRIGATION AND DEBRIDEMENT KNEE Right 04/05/2021   Procedure: IRRIGATION AND DEBRIDEMENT KNEE;  Surgeon: Melodi Dempsey, MD;  Location: [facility];  Service: Orthopedics;  Laterality: Right;   KNEE BURSECTOMY Right 03/13/2017   Procedure: RIGHT KNEE PREPATELLAR BURSECTOMY;  Surgeon: Melodi Dempsey, MD;  Location: [facility];  Service: Orthopedics;  Laterality: Right;   picc line placement Right    right upper arm   REIMPLANTATION OF TOTAL KNEE Right 12/07/2020   Procedure: REIMPLANTATION OF RIGHT TOTAL KNEE;  Surgeon: Melodi Dempsey, MD;  Location: [facility];  Service: Orthopedics;  Laterality: Right;  2 hrs   REPLACEMENT TOTAL HIP W/  RESURFACING IMPLANTS Bilateral 01/2001; 08/2010   left; right   REPLACEMENT TOTAL KNEE BILATERAL Bilateral 10/1991; 10/2006   right; left   REVISION TOTAL KNEE ARTHROPLASTY Right 2012   SPIGELIAN HERNIA Right 12/04/2015   Procedure: INCARCERATED SPIGELIAN HERNIA REPAIR WITH MESH;  Surgeon: Dann Hummer, MD;  Location: [facility];  Service: General;   Laterality: Right;   TOTAL KNEE ARTHROPLASTY Right 05/21/2014   Procedure: RIGHT KNEE ARTHROPLASTY REINPLANTATION;  Surgeon: Dempsey Melodi GAILS, MD;  Location: [facility];  Service: Orthopedics;  Laterality: Right;    Family History  Problem Relation Age of Onset   Colon cancer Mother 26       passed age 50 - stage 4    Hypertension Mother    Diabetes Mellitus II  Paternal Grandmother    Colon polyps Neg Hx    Esophageal cancer Neg Hx    Stomach cancer Neg Hx    Rectal cancer Neg Hx       Social History   Socioeconomic History   Marital status: Married    Spouse name: Not on file   Number of children: 1   Years of education: 14   Highest education level: Not on file  Occupational History   Not on file  Tobacco Use   Smoking status: Former    Current packs/day: 0.00    Average packs/day: 1 pack/day for 3.0 years (3.0 ttl pk-yrs)    Types: Cigarettes    Start date: 03/16/1978    Quit date: 03/16/1981    Years since quitting: 43.6   Smokeless tobacco: Former    Types: Snuff   Tobacco comments:    quit smoking cigarettes in the 1980's  Vaping Use   Vaping status: Never Used  Substance and Sexual Activity   Alcohol use: No   Drug use: No   Sexual activity: Yes    Partners: Male  Other Topics Concern   Not on file  Social History Narrative   Not on file   Social Drivers of Health   Tobacco Use: Medium Risk (03/03/2024)   Patient History    Smoking Tobacco Use: Former    Smokeless Tobacco Use: Former    Passive Exposure: Not on Stage Manager: Not on [employer] Insecurity: Not on file  Transportation Needs: Not on file  Physical Activity: Not on file  Stress: Not on file  Social Connections: Not on file  Depression (PHQ2-9): Low Risk (03/03/2024)   Depression (PHQ2-9)    PHQ-2 Score: 0  Alcohol Screen: Not on file  Housing: Not on file  Utilities: Not on file  Health Literacy: Not on file    Allergies  Allergen Reactions    Avelox [Moxifloxacin Hcl In Nacl] Hives, Shortness Of Breath and Itching    Tolerates Cipro    Chlorhexidine      Skin red and burning after skin wash   Doxycycline  Itching   Sulfa Antibiotics Itching   Vancomycin  Hives and Itching   E.E.S. [Erythromycin] Rash   Erythrocin Rash   Sulfasalazine Rash     Current Outpatient Medications:    cholecalciferol (VITAMIN D3) 25 MCG (1000 UNIT) tablet, Take 1,000 Units by mouth daily. (Patient not taking: Reported on 09/04/2023), Disp: , Rfl:    diltiazem  (CARDIZEM  CD) 180 MG 24 hr capsule, Take 1 capsule (180 mg total) by mouth daily., Disp: 30 capsule, Rfl: 2   diltiazem  (TIAZAC ) 180 MG 24 hr capsule, , Disp: , Rfl:    DILTIAZEM  HCL ER PO, Take 180 mg by mouth daily. (Patient not taking: Reported on 03/12/2023), Disp: , Rfl:    Fluticasone -Salmeterol (ADVAIR) 250-50 MCG/DOSE AEPB, Inhale 1 puff into the lungs daily., Disp: , Rfl:    folic acid  (FOLVITE ) 1 MG tablet, Take 1 mg by mouth daily., Disp: , Rfl:    Ipratropium-Albuterol  (COMBIVENT  RESPIMAT) 20-100 MCG/ACT AERS respimat, Inhale 1 puff into the lungs every 6 (six) hours as needed for wheezing or shortness of breath., Disp: , Rfl:    levofloxacin  (LEVAQUIN ) 750 MG tablet, Take 1 tablet (750 mg total) by mouth daily., Disp: 30 tablet, Rfl: 11   losartan  (COZAAR ) 100 MG tablet, Take 100 mg by mouth daily. , Disp: , Rfl:    methocarbamol  (ROBAXIN ) 500 MG tablet, Take 1 tablet (  500 mg total) by mouth every 6 (six) hours as needed for muscle spasms., Disp: 40 tablet, Rfl: 0   methotrexate  (RHEUMATREX) 2.5 MG tablet, Take 10 mg by mouth every Monday. Caution:Chemotherapy. Protect from light., Disp: , Rfl:    Multiple Vitamin (MULTIVITAMIN WITH MINERALS) TABS tablet, Take 1 tablet by mouth daily., Disp: , Rfl:    nystatin  (MYCOSTATIN /NYSTOP ) powder, Apply 1 application topically 3 (three) times daily., Disp: 30 g, Rfl: 5   oxyCODONE  (OXY IR/ROXICODONE ) 5 MG immediate release  tablet, Take 1-2 tablets (5-10 mg total) by mouth every 4 (four) hours as needed for severe pain. (Patient not taking: Reported on 03/03/2024), Disp: 30 tablet, Rfl: 0   RABEprazole (ACIPHEX) 20 MG tablet, Take 20 mg by mouth daily before breakfast. , Disp: , Rfl:    RYBELSUS  7 MG TABS, Take 7 mg by mouth every morning. (Patient not taking: Reported on 08/29/2022), Disp: , Rfl:    tirzepatide  (MOUNJARO ) 10 MG/0.5ML Pen, Mounjaro  10 mg/0.5 mL subcutaneous pen injector  INJECT 10MG  SUBCUTANEOUS ONCE A WEEK 30 DAYS (Patient not taking: Reported on 08/29/2022), Disp: , Rfl:    tirzepatide  (MOUNJARO ) 10 MG/0.5ML Pen, Inject 10 mg into the skin once a week. (Patient not taking: Reported on 03/03/2024), Disp: 2 mL, Rfl: 5   tirzepatide  (MOUNJARO ) 12.5 MG/0.5ML Pen, Inject 12.5 mg into the skin once a week. (Patient not taking: Reported on 03/03/2024), Disp: 2 mL, Rfl: 6   tirzepatide  (MOUNJARO ) 12.5 MG/0.5ML Pen, Inject 12.5 mg into the skin once a week., Disp: , Rfl:    tirzepatide  (MOUNJARO ) 5 MG/0.5ML Pen, Inject 5 mg into the skin once a week. (Patient not taking: Reported on 03/03/2024), Disp: 2 mL, Rfl: 3   tirzepatide  (MOUNJARO ) 7.5 MG/0.5ML Pen, Inject 7.5 mg into the skin once a week. (Patient not taking: Reported on 03/03/2024), Disp: 2 mL, Rfl: 5   tirzepatide  (MOUNJARO ) 7.5 MG/0.5ML Pen, Inject 7.5 mg into the skin once a week. (Patient not taking: Reported on 03/03/2024), Disp: 2 mL, Rfl: 5   traMADol  (ULTRAM ) 50 MG tablet, Take 1-2 tablets (50-100 mg total) by mouth every 6 (six) hours as needed for moderate pain., Disp: 40 tablet, Rfl: 0   triamterene -hydrochlorothiazide  (MAXZIDE -25) 37.5-25 MG tablet, Take 1 tablet by mouth daily., Disp: , Rfl:    Review of Systems  Constitutional:  Negative for activity change, appetite change, chills, diaphoresis, fatigue, fever and unexpected weight change.  HENT:  Negative for congestion, rhinorrhea, sinus pressure, sneezing, sore throat and trouble  swallowing.   Eyes:  Negative for photophobia and visual disturbance.  Respiratory:  Negative for cough, chest tightness, shortness of breath, wheezing and stridor.   Cardiovascular:  Negative for chest pain, palpitations and leg swelling.  Gastrointestinal:  Negative for abdominal distention, abdominal pain, anal bleeding, blood in stool, constipation, diarrhea, nausea and vomiting.  Genitourinary:  Negative for difficulty urinating, dysuria, flank pain and hematuria.  Musculoskeletal:  Positive for joint swelling. Negative for arthralgias, back pain, gait problem and myalgias.  Skin:  Negative for color change, pallor, rash and wound.  Neurological:  Negative for dizziness, tremors, weakness and light-headedness.  Hematological:  Negative for adenopathy. Does not bruise/bleed easily.  Psychiatric/Behavioral:  Negative for agitation, behavioral problems, confusion, decreased concentration, dysphoric mood and sleep disturbance.        Objective:   Physical Exam Constitutional:      Appearance: He is well-developed.  HENT:     Head: Normocephalic and atraumatic.  Eyes:  Conjunctiva/sclera: Conjunctivae normal.  Cardiovascular:     Rate and Rhythm: Normal rate and regular rhythm.  Pulmonary:     Effort: Pulmonary effort is normal. No respiratory distress.     Breath sounds: No wheezing.  Abdominal:     General: There is no distension.     Palpations: Abdomen is soft.  Musculoskeletal:        General: No tenderness. Normal range of motion.     Cervical back: Normal range of motion and neck supple.  Skin:    General: Skin is warm and dry.     Coloration: Skin is not pale.     Findings: No erythema or rash.  Neurological:     General: No focal deficit present.     Mental Status: He is alert and oriented to person, place, and time.  Psychiatric:        Mood and Affect: Mood normal.        Behavior: Behavior normal.        Thought Content: Thought content normal.         Judgment: Judgment normal.     Right knee 05/17/2021:        Knee 09/18/2021:    Assessment & Plan:   Assessment and Plan              "

## 2024-10-12 ENCOUNTER — Ambulatory Visit: Admitting: Infectious Disease

## 2024-10-12 DIAGNOSIS — M009 Pyogenic arthritis, unspecified: Secondary | ICD-10-CM

## 2024-10-12 DIAGNOSIS — M069 Rheumatoid arthritis, unspecified: Secondary | ICD-10-CM

## 2024-10-12 DIAGNOSIS — T8453XD Infection and inflammatory reaction due to internal right knee prosthesis, subsequent encounter: Secondary | ICD-10-CM

## 2024-10-12 DIAGNOSIS — A499 Bacterial infection, unspecified: Secondary | ICD-10-CM

## 2024-10-14 ENCOUNTER — Telehealth: Payer: Self-pay | Admitting: Infectious Disease

## 2024-10-14 NOTE — Telephone Encounter (Signed)
 Called [facility] pharmacy - patient abx was filled on 12/29 and put in pill pack.  Left VM informing patient.   Cali Cuartas SHAUNNA Letters, CMA

## 2024-10-14 NOTE — Telephone Encounter (Signed)
 I called [name] to reschedule his upcoming appt due to provider meeting. Pt requested a refill on his antibiotic to [facility] on file. [name] is now scheduled 2/3.

## 2024-10-21 ENCOUNTER — Ambulatory Visit: Payer: Self-pay | Admitting: Infectious Disease

## 2024-11-15 NOTE — Progress Notes (Unsigned)
 "  Subjective:  Chief complaint: followup for PJI   patient ID: [name], male    DOB: 08-20-1961, 64 y.o.   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  He then had prostghetic joint infection with Staphylococcus lugdunensis and (methicillin sensitive in 2017 which was treated with daptomycin  along with rifampin  after I&D and polyexchange.  He apparently developed a vancomycin  allergy at that time.  He completed 6 weeks of therapy and then stopped.  He then returned in 2017 with knee pain and swelling and now was found again to have prosthetic joint infection was taken to the operating room but nothing grew from culture and he was treated for 6 weeks with daptomycin  after I&D and polyexchange he is followed by Dr. Efrain and placed on Augmentin  in February 2018 and completed at least 5 months of oral therapy.  Interim history;   He then underwent additional I&D with pollex change in November 2018 recurrent right knee infection again with negative cultures.  Then had aspirate the knee that grew Citrobacter koseri that was fairly sensitive and underwent another I&D with polyexchange in May 2021.  He was treated with protracted oral ciprofloxacin  but was having loosening of his prosthesis and was concern for infection and underwent I&D with resection arthroplasty now with an antibiotic spacer placed in September 2021.  OR cultures now yielded ampicillin  sensitive Enterococcus faecalis.  He completed 6 weeks of IV Zosyn on October 22.  After that he was placed on Augmentin  875 125 twice daily until February 2.  It was then stopped and he ultimately underwent reimplantation of right total knee arthroplasty on December 07, 2020.  Cultures in the OR at the time of surgery were without growth.  He was doing well until few days prior to admission this June 2022 he was admitted by orthopedics due to concerns for recurrence of prosthetic joint infection.  He was having increased discomfort drainage from an anterior aspect of the knee.  He underwent I&D by Dr. Hiram on June 22.  Cultures done in the  OR grew Pasteurella species.  Patient had been in the interim discharged on daptomycin  and ceftriaxone  prior to cultures resulting.  He was seen and followed by Dr.Manandhar and changed to continuous dose ampicillin  the patient completed on followed with oral amoxicillin .   In asking him closely at of his visits he told me he did  have a boxer that likes to lick his feet and also likes to jump into his lap frequently and sometimes scratches his skin.  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  I went to great lengths  several visits ago to explain that Pasteurella is a normal part of the oral flora of dogs and that the dog is not unhealthy and is in no way doing anything wrong with his behaviors though I did caution him to try to protect his skin and in particular his wounds from being scratched by his dog.  Seen in January by Dr. Hiram who rated his knee and obtained cell count differential I do not have access to as well as a culture that grew a staphylococcal species that was sensitive  against all antibiotics against present was tested.  Amoxicillin  indeed would NOT be active vs even MSSA. He was switched to ciprofloxacin  and was experiencing less pain in the knee since then.   I obtained sensitivities on the organism and it was a Staphylococcus pseudo intermedius that was sensitive to oxacillin.    I hanged the patient over to Augmentin .  He has been following along with Dr.Alusio and not had further drainage of his  knee.  Knee pain appeared to have been improving.  Unfortunately in the interim he has developed worsening swelling of the knee.  He underwent sterile aspiration of the knee which revealed regular fluid per Dr. Daralyn note.  Unfortunately the cultures from this aspirate have yielded an extended spectrum beta-lactamase producing Klebsiella species which curiously is reported as being sensitive to levofloxacin  but resistant to ciprofloxacin  sensitive to Bactrim and carbapenems but otherwise resistant.     He has been on levofloxacin  and tolerated this.   Int the interm he had aspirate of the knee performed and this showed 13,806 white blood cells and 97% neutrophils which is disconcerting.  Cultures did not yield an organism.        Over the last several visits his knee pain and swelling has been relatively stable he has not required drainage of fluid by Dr. Hiram.  He is seeing Dr. Ishmael for his rheumatoid arthritis now   Apparently she wants LFTs checked with her his meds chronically so we are adding LFTs to our labs that we routinely check with for [facility].  His knee painhad not him worsened.  He has seen Dr. Hiram who drew fluid off his knee earlier this month but did not sent for fluid analysis or culture.  [name] states that Dr.  Hiram felt the fluid did not look infected.  When I last saw him in May 24   I last saw [name] he has not had any further aspirations of his knee by Dr. Hiram.  His knee pain is currently stable worsening when he has accumulation of fluid in the joint  Is following with Dr. Jon Ishmael and taking methotrexate  for his rheumatoid arthritis  Discussed the use of AI scribe software for clinical note transcription with the patient, who gave verbal consent to proceed.  History of Present Illness   [name] is a 64 year old male with recurrent knee infections who presents for follow-up of his right knee effusion and infection  management.  He has been experiencing recurrent effusions in his right knee, necessitating aspiration. The most recent aspiration occurred on a Thursday. It had been twelve weeks since the previous aspiration, and prior to that, he had the knee drained three times last year, with intervals of ten to eleven weeks between procedures. He recalls having the knee drained in May and June of the previous year.  He is currently on Levaquin  for a bacterial infection in the knee, which is sensitive to Levaquin  but resistant to Cipro . He has not experienced any side effects from the antibiotics.   His knee pain varies, typically rating it as a two or three out of ten, but it can increase to eight or nine when there is significant fluid accumulation. The pain is consistent unless there is a lot of fluid present.  He is also being treated for rheumatoid arthritis with methotrexate , which he takes four tablets ONCE a week.        Past Medical History:  Diagnosis Date   Allergy  OCC    Anemia    hx of   Asthma    Cataract    small- forming    Cellulitis of lower extremity    usually RLL; this time it's got up to my lower abdoment (02/11/2014)-series of antibiotics completed 02-28-14   Diverticulosis    Edema    legs   ESBL (extended spectrum beta-lactamase) producing bacteria infection 04/11/2022   GERD (gastroesophageal reflux disease)    GERD (gastroesophageal reflux disease) 04/11/2022   Hypertension    Infection of right knee (HCC)    OSA (obstructive sleep apnea)    does not use cpap    Pasteurella infection 05/17/2021   Pneumonia    20+ years ago   PONV (postoperative nausea and vomiting)    Rheumatoid arthritis (HCC) dx'd ~ 1977   S/P PICC central line placement 03-16-14   right upper arm remains intact.05-11-14 removed 1 week ago.   Sleep apnea    no cpap     Past Surgical History:  Procedure Laterality Date   COLONOSCOPY  2012   EXCISIONAL HEMORRHOIDECTOMY     EXCISIONAL TOTAL  KNEE ARTHROPLASTY WITH ANTIBIOTIC SPACERS Right 03/18/2014   Procedure: RIGHT KNEE RESECTION ARTHROPLASTY WITH ANTIBIOTIC SPACERS;  Surgeon: Dempsey Melodi GAILS, MD;  Location: [facility];  Service: Orthopedics;  Laterality: Right;   EXCISIONAL TOTAL KNEE ARTHROPLASTY WITH ANTIBIOTIC SPACERS Right 06/22/2020   Procedure: Right knee resection arthroplasty, antibiotic spacer;  Surgeon: Melodi Dempsey, MD;  Location: [facility];  Service: Orthopedics;  Laterality: Right;    FOREIGN BODY REMOVAL Left    BB removal above left eye-teen yrs.   HERNIA REPAIR     I & D KNEE WITH POLY EXCHANGE Right 04/25/2016   Procedure: RIGHT KNEE IRRIGATION AND DEBRIDEMENT WITH POLY EXCHANGE;  Surgeon: Dempsey Melodi, MD;  Location: [facility];  Service: Orthopedics;  Laterality: Right;   I & D KNEE WITH POLY EXCHANGE Right 10/01/2016   Procedure: IRRIGATION AND DEBRIDEMENT KNEE WITH POLY EXCHANGE;  Surgeon: Dempsey Melodi, MD;  Location: [facility];  Service: Orthopedics;  Laterality: Right;   I & D KNEE WITH POLY EXCHANGE Right 08/26/2017   Procedure: IRRIGATION AND DEBRIDEMENT KNEE WITH POLY EXCHANGE;  Surgeon: Melodi Dempsey, MD;  Location: [facility];  Service: Orthopedics;  Laterality: Right;   I & D KNEE WITH POLY EXCHANGE Right 11/16/2019   Procedure: IRRIGATION AND DEBRIDEMENT KNEE WITH POLY EXCHANGE;  Surgeon: Melodi Dempsey, MD;  Location: [facility];  Service: Orthopedics;  Laterality: Right;    I & D KNEE WITH POLY EXCHANGE Right 02/22/2020   Procedure: Right knee irrigation and debridement;  Surgeon: Melodi Dempsey, MD;  Location: [facility];  Service: Orthopedics;  Laterality: Right;    INCISION AND DRAINAGE Right 09/28/2014   Procedure: INCISION AND DRAINAGE RIGHT KNEE;  Surgeon: Dempsey Melodi GAILS, MD;  Location: [facility];  Service: Orthopedics;  Laterality: Right;   IR FLUORO GUIDE CV LINE RIGHT  04/07/2021   IR REMOVAL TUN CV CATH W/O FL  05/17/2021   IR US  GUIDE VASC ACCESS RIGHT  04/07/2021   IRRIGATION AND DEBRIDEMENT KNEE  Right 04/05/2021   Procedure: IRRIGATION AND DEBRIDEMENT KNEE;  Surgeon: Melodi Dempsey, MD;  Location: [facility];  Service: Orthopedics;  Laterality: Right;   KNEE BURSECTOMY Right 03/13/2017   Procedure: RIGHT KNEE PREPATELLAR BURSECTOMY;  Surgeon: Melodi Dempsey, MD;  Location: [facility];  Service: Orthopedics;  Laterality: Right;   picc line placement Right    right upper  arm   REIMPLANTATION OF TOTAL KNEE Right 12/07/2020   Procedure: REIMPLANTATION OF RIGHT TOTAL KNEE;  Surgeon: Melodi Lerner, MD;  Location: [facility];  Service: Orthopedics;  Laterality: Right;  2 hrs   REPLACEMENT TOTAL HIP W/  RESURFACING IMPLANTS Bilateral 01/2001; 08/2010   left; right   REPLACEMENT TOTAL KNEE BILATERAL Bilateral 10/1991; 10/2006   right; left   REVISION TOTAL KNEE ARTHROPLASTY Right 2012   SPIGELIAN HERNIA Right 12/04/2015   Procedure: INCARCERATED SPIGELIAN HERNIA REPAIR WITH MESH;  Surgeon: Dann Hummer, MD;  Location[facility];  Service: General;  Laterality: Right;   TOTAL KNEE ARTHROPLASTY Right 05/21/2014   Procedure: RIGHT KNEE ARTHROPLASTY REINPLANTATION;  Surgeon: Lerner Melodi GAILS, MD;  Location: [facility];  Service: Orthopedics;  Laterality: Right;    Family History  Problem Relation Age of Onset   Colon cancer Mother 13       passed age 52 - stage 4    Hypertension Mother    Diabetes Mellitus II Paternal Grandmother    Colon polyps Neg Hx    Esophageal cancer Neg Hx    Stomach cancer Neg Hx    Rectal cancer Neg Hx       Social History   Socioeconomic History   Marital status: Married    Spouse name: Not on file   Number of children: 1   Years of education: 14   Highest education level: Not on file  Occupational History   Not on file  Tobacco Use   Smoking status: Former    Current packs/day: 0.00    Average packs/day: 1 pack/day for 3.0 years (3.0 ttl pk-yrs)    Types: Cigarettes    Start date: 03/16/1978    Quit date: 03/16/1981    Years since quitting: 43.6   Smokeless tobacco:  Former    Types: Snuff   Tobacco comments:    quit smoking cigarettes in the 1980's  Vaping Use   Vaping status: Never Used  Substance and Sexual Activity   Alcohol use: No   Drug use: No   Sexual activity: Yes    Partners: Male  Other Topics Concern   Not on file  Social History Narrative   Not on file   Social Drivers of Health   Tobacco Use: Medium Risk (03/03/2024)   Patient History    Smoking Tobacco Use: Former    Smokeless Tobacco Use: Former    Passive Exposure: Not on Stage Manager: Not on [employer] Insecurity: Not on file  Transportation Needs: Not on file  Physical Activity: Not on file  Stress: Not on file  Social Connections: Not on file  Depression (PHQ2-9): Low Risk (03/03/2024)   Depression (PHQ2-9)    PHQ-2 Score: 0  Alcohol Screen: Not on file  Housing: Not on file  Utilities: Not on file  Health Literacy: Not on file    Allergies  Allergen Reactions   Avelox [Moxifloxacin Hcl In Nacl] Hives, Shortness Of Breath and Itching    Tolerates Cipro    Chlorhexidine      Skin red and burning after skin wash   Doxycycline  Itching   Sulfa Antibiotics Itching   Vancomycin  Hives and Itching   E.E.S. [Erythromycin] Rash   Erythrocin Rash   Sulfasalazine Rash     Current Outpatient Medications:    cholecalciferol (VITAMIN D3) 25 MCG (1000 UNIT) tablet, Take 1,000 Units by mouth daily. (Patient not taking: Reported on 09/04/2023), Disp: , Rfl:    diltiazem  (CARDIZEM   CD) 180 MG 24 hr capsule, Take 1 capsule (180 mg total) by mouth daily., Disp: 30 capsule, Rfl: 2   diltiazem  (TIAZAC ) 180 MG 24 hr capsule, , Disp: , Rfl:    DILTIAZEM  HCL ER PO, Take 180 mg by mouth daily. (Patient not taking: Reported on 03/12/2023), Disp: , Rfl:    Fluticasone -Salmeterol (ADVAIR) 250-50 MCG/DOSE AEPB, Inhale 1 puff into the lungs daily., Disp: , Rfl:    folic acid  (FOLVITE ) 1 MG tablet, Take 1 mg by mouth daily., Disp: , Rfl:    Ipratropium-Albuterol   (COMBIVENT  RESPIMAT) 20-100 MCG/ACT AERS respimat, Inhale 1 puff into the lungs every 6 (six) hours as needed for wheezing or shortness of breath., Disp: , Rfl:    levofloxacin  (LEVAQUIN ) 750 MG tablet, Take 1 tablet (750 mg total) by mouth daily., Disp: 30 tablet, Rfl: 11   losartan  (COZAAR ) 100 MG tablet, Take 100 mg by mouth daily. , Disp: , Rfl:    methocarbamol  (ROBAXIN ) 500 MG tablet, Take 1 tablet (500 mg total) by mouth every 6 (six) hours as needed for muscle spasms., Disp: 40 tablet, Rfl: 0   methotrexate  (RHEUMATREX) 2.5 MG tablet, Take 10 mg by mouth every Monday. Caution:Chemotherapy. Protect from light., Disp: , Rfl:    Multiple Vitamin (MULTIVITAMIN WITH MINERALS) TABS tablet, Take 1 tablet by mouth daily., Disp: , Rfl:    nystatin  (MYCOSTATIN /NYSTOP ) powder, Apply 1 application topically 3 (three) times daily., Disp: 30 g, Rfl: 5   oxyCODONE  (OXY IR/ROXICODONE ) 5 MG immediate release tablet, Take 1-2 tablets (5-10 mg total) by mouth every 4 (four) hours as needed for severe pain. (Patient not taking: Reported on 03/03/2024), Disp: 30 tablet, Rfl: 0   RABEprazole (ACIPHEX) 20 MG tablet, Take 20 mg by mouth daily before breakfast. , Disp: , Rfl:    RYBELSUS  7 MG TABS, Take 7 mg by mouth every morning. (Patient not taking: Reported on 08/29/2022), Disp: , Rfl:    tirzepatide  (MOUNJARO ) 10 MG/0.5ML Pen, Mounjaro  10 mg/0.5 mL subcutaneous pen injector  INJECT 10MG  SUBCUTANEOUS ONCE A WEEK 30 DAYS (Patient not taking: Reported on 08/29/2022), Disp: , Rfl:    tirzepatide  (MOUNJARO ) 10 MG/0.5ML Pen, Inject 10 mg into the skin once a week. (Patient not taking: Reported on 03/03/2024), Disp: 2 mL, Rfl: 5   tirzepatide  (MOUNJARO ) 12.5 MG/0.5ML Pen, Inject 12.5 mg into the skin once a week. (Patient not taking: Reported on 03/03/2024), Disp: 2 mL, Rfl: 6   tirzepatide  (MOUNJARO ) 12.5 MG/0.5ML Pen, Inject 12.5 mg into the skin once a week., Disp: , Rfl:    tirzepatide  (MOUNJARO ) 5 MG/0.5ML Pen, Inject  5 mg into the skin once a week. (Patient not taking: Reported on 03/03/2024), Disp: 2 mL, Rfl: 3   tirzepatide  (MOUNJARO ) 7.5 MG/0.5ML Pen, Inject 7.5 mg into the skin once a week. (Patient not taking: Reported on 03/03/2024), Disp: 2 mL, Rfl: 5   tirzepatide  (MOUNJARO ) 7.5 MG/0.5ML Pen, Inject 7.5 mg into the skin once a week. (Patient not taking: Reported on 03/03/2024), Disp: 2 mL, Rfl: 5   traMADol  (ULTRAM ) 50 MG tablet, Take 1-2 tablets (50-100 mg total) by mouth every 6 (six) hours as needed for moderate pain., Disp: 40 tablet, Rfl: 0   triamterene -hydrochlorothiazide  (MAXZIDE -25) 37.5-25 MG tablet, Take 1 tablet by mouth daily., Disp: , Rfl:    Review of Systems  Constitutional:  Negative for activity change, appetite change, chills, diaphoresis, fatigue, fever and unexpected weight change.  HENT:  Negative for congestion, rhinorrhea, sinus pressure, sneezing, sore  throat and trouble swallowing.   Eyes:  Negative for photophobia and visual disturbance.  Respiratory:  Negative for cough, chest tightness, shortness of breath, wheezing and stridor.   Cardiovascular:  Negative for chest pain, palpitations and leg swelling.  Gastrointestinal:  Negative for abdominal distention, abdominal pain, anal bleeding, blood in stool, constipation, diarrhea, nausea and vomiting.  Genitourinary:  Negative for difficulty urinating, dysuria, flank pain and hematuria.  Musculoskeletal:  Positive for arthralgias and joint swelling. Negative for back pain, gait problem and myalgias.  Skin:  Negative for color change, pallor, rash and wound.  Neurological:  Negative for dizziness, tremors, weakness and light-headedness.  Hematological:  Negative for adenopathy. Does not bruise/bleed easily.  Psychiatric/Behavioral:  Negative for agitation, behavioral problems, confusion, decreased concentration, dysphoric mood and sleep disturbance.        Objective:   Physical Exam Constitutional:      Appearance: He is  well-developed.  HENT:     Head: Normocephalic and atraumatic.  Eyes:     Conjunctiva/sclera: Conjunctivae normal.  Cardiovascular:     Rate and Rhythm: Normal rate and regular rhythm.  Pulmonary:     Effort: Pulmonary effort is normal. No respiratory distress.     Breath sounds: No wheezing.  Abdominal:     General: There is no distension.     Palpations: Abdomen is soft.  Musculoskeletal:     Cervical back: Normal range of motion and neck supple.     Right knee: Swelling present.  Skin:    General: Skin is warm and dry.     Coloration: Skin is not pale.     Findings: No erythema or rash.  Neurological:     General: No focal deficit present.     Mental Status: He is alert and oriented to person, place, and time.  Psychiatric:        Mood and Affect: Mood normal.        Behavior: Behavior normal.        Thought Content: Thought content normal.        Judgment: Judgment normal.         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 Infection controlled, no significant antibiotic side effects. Fluid aspiration every 10-11 weeks. Pain varies with fluid accumulation. If we cannot suppress with antibiotics he will likely need a fusion or AKA. - Continue Levaquin . - Order labs for inflammatory markers, ESR, CRP keeping in mind confounding from RA - Schedule follow-up in one year unless knee drainage or infection issues arise.   RA: followed by Dr. Ishmael    RA:        "

## 2024-11-17 ENCOUNTER — Other Ambulatory Visit: Payer: Self-pay

## 2024-11-17 ENCOUNTER — Encounter: Payer: Self-pay | Admitting: Infectious Disease

## 2024-11-17 ENCOUNTER — Ambulatory Visit: Payer: Self-pay | Admitting: Infectious Disease

## 2024-11-17 VITALS — BP 162/84 | HR 102 | Temp 97.5°F | Wt 284.0 lb

## 2024-11-17 DIAGNOSIS — Z1612 Extended spectrum beta lactamase (ESBL) resistance: Secondary | ICD-10-CM | POA: Diagnosis not present

## 2024-11-17 DIAGNOSIS — T8453XD Infection and inflammatory reaction due to internal right knee prosthesis, subsequent encounter: Secondary | ICD-10-CM | POA: Diagnosis not present

## 2024-11-17 DIAGNOSIS — A499 Bacterial infection, unspecified: Secondary | ICD-10-CM

## 2024-11-17 DIAGNOSIS — M069 Rheumatoid arthritis, unspecified: Secondary | ICD-10-CM

## 2024-11-17 DIAGNOSIS — T8450XD Infection and inflammatory reaction due to unspecified internal joint prosthesis, subsequent encounter: Secondary | ICD-10-CM

## 2024-11-17 MED ORDER — LEVOFLOXACIN 750 MG PO TABS: 750.0000 mg | ORAL_TABLET | Freq: Every day | ORAL | 11 refills | Status: AC

## 2024-11-18 LAB — BASIC METABOLIC PANEL WITHOUT GFR
BUN: 16 mg/dL (ref 7–25)
CO2: 27 mmol/L (ref 20–32)
Calcium: 9.1 mg/dL (ref 8.6–10.3)
Chloride: 105 mmol/L (ref 98–110)
Creat: 1.07 mg/dL (ref 0.70–1.35)
Glucose, Bld: 79 mg/dL (ref 65–99)
Potassium: 4.2 mmol/L (ref 3.5–5.3)
Sodium: 140 mmol/L (ref 135–146)

## 2024-11-18 LAB — CBC WITH DIFFERENTIAL/PLATELET
Absolute Lymphocytes: 1232 {cells}/uL (ref 850–3900)
Absolute Monocytes: 320 {cells}/uL (ref 200–950)
Basophils Absolute: 32 {cells}/uL (ref 0–200)
Basophils Relative: 0.4 %
Eosinophils Absolute: 256 {cells}/uL (ref 15–500)
Eosinophils Relative: 3.2 %
HCT: 46.4 % (ref 39.4–51.1)
Hemoglobin: 15.2 g/dL (ref 13.2–17.1)
MCH: 29.7 pg (ref 27.0–33.0)
MCHC: 32.8 g/dL (ref 31.6–35.4)
MCV: 90.8 fL (ref 81.4–101.7)
MPV: 9 fL (ref 7.5–12.5)
Monocytes Relative: 4 %
Neutro Abs: 6160 {cells}/uL (ref 1500–7800)
Neutrophils Relative %: 77 %
Platelets: 329 10*3/uL (ref 140–400)
RBC: 5.11 Million/uL (ref 4.20–5.80)
RDW: 13.7 % (ref 11.0–15.0)
Total Lymphocyte: 15.4 %
WBC: 8 10*3/uL (ref 3.8–10.8)

## 2024-11-18 LAB — SEDIMENTATION RATE: Sed Rate: 34 mm/h — ABNORMAL HIGH (ref 0–20)

## 2024-11-18 LAB — C-REACTIVE PROTEIN: CRP: 36.2 mg/L — ABNORMAL HIGH
